# Patient Record
Sex: Female | Born: 1945
Health system: Southern US, Community
[De-identification: ages and names within clinical notes are randomized; demographics above are authoritative.]

## PROBLEM LIST (undated history)

## (undated) ENCOUNTER — Emergency Department (HOSPITAL_COMMUNITY): Payer: PRIVATE HEALTH INSURANCE

## (undated) DIAGNOSIS — Z8489 Family history of other specified conditions: Secondary | ICD-10-CM

## (undated) DIAGNOSIS — T7840XA Allergy, unspecified, initial encounter: Secondary | ICD-10-CM

## (undated) DIAGNOSIS — K219 Gastro-esophageal reflux disease without esophagitis: Secondary | ICD-10-CM

## (undated) DIAGNOSIS — D649 Anemia, unspecified: Secondary | ICD-10-CM

## (undated) DIAGNOSIS — M1990 Unspecified osteoarthritis, unspecified site: Secondary | ICD-10-CM

## (undated) DIAGNOSIS — I739 Peripheral vascular disease, unspecified: Secondary | ICD-10-CM

## (undated) DIAGNOSIS — J449 Chronic obstructive pulmonary disease, unspecified: Secondary | ICD-10-CM

## (undated) DIAGNOSIS — J189 Pneumonia, unspecified organism: Secondary | ICD-10-CM

## (undated) HISTORY — DX: Chronic obstructive pulmonary disease, unspecified: J44.9

## (undated) HISTORY — DX: Allergy, unspecified, initial encounter: T78.40XA

---

## 1998-07-06 HISTORY — PX: ABDOMINAL HYSTERECTOMY: SHX81

## 1999-02-24 ENCOUNTER — Inpatient Hospital Stay (HOSPITAL_COMMUNITY): Admission: RE | Admit: 1999-02-24 | Discharge: 1999-02-26 | Payer: Self-pay | Admitting: *Deleted

## 1999-02-24 ENCOUNTER — Encounter (INDEPENDENT_AMBULATORY_CARE_PROVIDER_SITE_OTHER): Payer: Self-pay

## 1999-02-24 ENCOUNTER — Encounter (INDEPENDENT_AMBULATORY_CARE_PROVIDER_SITE_OTHER): Payer: Self-pay | Admitting: Specialist

## 2000-12-28 ENCOUNTER — Ambulatory Visit (HOSPITAL_COMMUNITY): Admission: RE | Admit: 2000-12-28 | Discharge: 2000-12-28 | Payer: Self-pay | Admitting: Internal Medicine

## 2000-12-28 ENCOUNTER — Encounter: Payer: Self-pay | Admitting: Internal Medicine

## 2002-01-15 ENCOUNTER — Emergency Department (HOSPITAL_COMMUNITY): Admission: EM | Admit: 2002-01-15 | Discharge: 2002-01-15 | Payer: Self-pay | Admitting: Emergency Medicine

## 2002-03-14 ENCOUNTER — Encounter: Payer: Self-pay | Admitting: Orthopedic Surgery

## 2002-03-21 ENCOUNTER — Ambulatory Visit (HOSPITAL_COMMUNITY): Admission: RE | Admit: 2002-03-21 | Discharge: 2002-03-21 | Payer: Self-pay | Admitting: Internal Medicine

## 2002-03-21 ENCOUNTER — Encounter: Payer: Self-pay | Admitting: Internal Medicine

## 2002-07-18 ENCOUNTER — Emergency Department (HOSPITAL_COMMUNITY): Admission: EM | Admit: 2002-07-18 | Discharge: 2002-07-18 | Payer: Self-pay | Admitting: *Deleted

## 2002-07-18 ENCOUNTER — Encounter: Payer: Self-pay | Admitting: *Deleted

## 2003-12-09 ENCOUNTER — Emergency Department (HOSPITAL_COMMUNITY): Admission: EM | Admit: 2003-12-09 | Discharge: 2003-12-09 | Payer: Self-pay | Admitting: Emergency Medicine

## 2003-12-09 ENCOUNTER — Encounter: Payer: Self-pay | Admitting: Orthopedic Surgery

## 2006-12-13 ENCOUNTER — Emergency Department: Payer: Self-pay | Admitting: Emergency Medicine

## 2007-08-20 ENCOUNTER — Emergency Department (HOSPITAL_COMMUNITY): Admission: EM | Admit: 2007-08-20 | Discharge: 2007-08-20 | Payer: Self-pay | Admitting: Emergency Medicine

## 2009-10-16 ENCOUNTER — Emergency Department (HOSPITAL_COMMUNITY): Admission: EM | Admit: 2009-10-16 | Discharge: 2009-10-16 | Payer: Self-pay | Admitting: Emergency Medicine

## 2009-10-27 ENCOUNTER — Emergency Department (HOSPITAL_COMMUNITY): Admission: EM | Admit: 2009-10-27 | Discharge: 2009-10-27 | Payer: Self-pay | Admitting: Emergency Medicine

## 2009-10-27 ENCOUNTER — Encounter: Payer: Self-pay | Admitting: Orthopedic Surgery
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
## 2009-12-03 ENCOUNTER — Encounter: Payer: Self-pay | Admitting: Orthopedic Surgery

## 2009-12-04 ENCOUNTER — Ambulatory Visit: Payer: Self-pay | Admitting: Orthopedic Surgery

## 2009-12-04 DIAGNOSIS — S335XXA Sprain of ligaments of lumbar spine, initial encounter: Secondary | ICD-10-CM | POA: Insufficient documentation

## 2009-12-17 ENCOUNTER — Encounter: Payer: Self-pay | Admitting: Orthopedic Surgery

## 2009-12-17 ENCOUNTER — Encounter (HOSPITAL_COMMUNITY): Admission: RE | Admit: 2009-12-17 | Discharge: 2010-01-16 | Payer: Self-pay | Admitting: Orthopedic Surgery

## 2010-01-15 ENCOUNTER — Encounter: Payer: Self-pay | Admitting: Orthopedic Surgery

## 2010-01-17 ENCOUNTER — Encounter (HOSPITAL_COMMUNITY): Admission: RE | Admit: 2010-01-17 | Discharge: 2010-02-16 | Payer: Self-pay | Admitting: Orthopedic Surgery

## 2010-01-24 ENCOUNTER — Encounter: Payer: Self-pay | Admitting: Orthopedic Surgery

## 2010-02-24 ENCOUNTER — Ambulatory Visit: Payer: Self-pay | Admitting: Orthopedic Surgery

## 2010-04-07 ENCOUNTER — Ambulatory Visit: Payer: Self-pay | Admitting: Orthopedic Surgery

## 2010-04-07 DIAGNOSIS — M5126 Other intervertebral disc displacement, lumbar region: Secondary | ICD-10-CM | POA: Insufficient documentation

## 2010-04-21 ENCOUNTER — Telehealth (INDEPENDENT_AMBULATORY_CARE_PROVIDER_SITE_OTHER): Payer: Self-pay | Admitting: *Deleted

## 2010-04-28 ENCOUNTER — Encounter: Payer: Self-pay | Admitting: Orthopedic Surgery

## 2010-05-01 ENCOUNTER — Telehealth: Payer: Self-pay | Admitting: Orthopedic Surgery

## 2010-05-16 ENCOUNTER — Encounter (INDEPENDENT_AMBULATORY_CARE_PROVIDER_SITE_OTHER): Payer: Self-pay | Admitting: *Deleted

## 2010-05-19 ENCOUNTER — Encounter: Payer: Self-pay | Admitting: Orthopedic Surgery

## 2010-05-20 ENCOUNTER — Ambulatory Visit (HOSPITAL_COMMUNITY)
Admission: RE | Admit: 2010-05-20 | Discharge: 2010-05-20 | Payer: Self-pay | Source: Home / Self Care | Admitting: Orthopedic Surgery

## 2010-05-27 ENCOUNTER — Ambulatory Visit: Payer: Self-pay | Admitting: Orthopedic Surgery

## 2010-06-16 ENCOUNTER — Telehealth: Payer: Self-pay | Admitting: Orthopedic Surgery

## 2010-06-19 ENCOUNTER — Encounter (INDEPENDENT_AMBULATORY_CARE_PROVIDER_SITE_OTHER): Payer: Self-pay | Admitting: *Deleted

## 2010-06-21 ENCOUNTER — Emergency Department (HOSPITAL_COMMUNITY)
Admission: EM | Admit: 2010-06-21 | Discharge: 2010-06-21 | Payer: Self-pay | Source: Home / Self Care | Admitting: Emergency Medicine

## 2010-07-08 ENCOUNTER — Encounter
Admission: RE | Admit: 2010-07-08 | Discharge: 2010-07-08 | Payer: Self-pay | Source: Home / Self Care | Attending: Orthopedic Surgery | Admitting: Orthopedic Surgery

## 2010-07-22 ENCOUNTER — Encounter
Admission: RE | Admit: 2010-07-22 | Discharge: 2010-07-22 | Payer: Self-pay | Source: Home / Self Care | Attending: Orthopedic Surgery | Admitting: Orthopedic Surgery

## 2010-07-27 ENCOUNTER — Encounter: Payer: Self-pay | Admitting: Orthopedic Surgery

## 2010-08-05 NOTE — Miscellaneous (Signed)
Summary: PT clinical evaluation  PT clinical evaluation   Imported By: Jacklynn Ganong 12/30/2009 15:08:42  _____________________________________________________________________  External Attachment:    Type:   Image     Comment:   External Document
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
## 2010-08-05 NOTE — Miscellaneous (Signed)
Summary: MRI pre-auth info and MRI appt scheduled  Clinical Lists Changes  Contacted [employer]; verified eligibility (birthdate issue in their system resolved), verified per phone and per online system.   Re: pre-cert for MRI of Lspine, CPT [id number], dx 722.10, spoke w/Ms. Moss; received pre-auth/notification # [phone].   Scheduled MRI appointment at [facility], 05/20/10 at 2:00pm, registration 1:30pm.  Patient to follow up here for results.  Notified patient and have scheduled fol/up appt.

## 2010-08-05 NOTE — Miscellaneous (Signed)
Summary: PT discharge summary  PT discharge summary   Imported By: Jacklynn Ganong 01/30/2010 11:42:51  _____________________________________________________________________  External Attachment:    Type:   Image     Comment:   External Document

## 2010-08-05 NOTE — Letter (Signed)
Summary: Medical record request [facility]  Medical record request [facility]   Imported By: Cammie Sickle 05/05/2010 17:41:43  _____________________________________________________________________  External Attachment:    Type:   Image     Comment:   External Document

## 2010-08-05 NOTE — Miscellaneous (Signed)
Summary: PT Progress note  PT Progress note   Imported By: Jacklynn Ganong 01/21/2010 12:21:29  _____________________________________________________________________  External Attachment:    Type:   Image     Comment:   External Document

## 2010-08-05 NOTE — Progress Notes (Signed)
Summary: Patient has updated [employer] ins w/correct DOB  Phone Note Call from Patient   Caller: Patient Summary of Call: Patient called to relay that she has spoken w/[employer] member services and they are correcting her DOB to 2046/01/17. They had an incorrect date of birth previously.  She was told we should be able to verify her in system correctly and schedule her MRI. Initial call taken by: Jacklynn Ganong,  May 01, 2010 2:07 PM  Follow-up for Phone Call        Checked on status of birthdate corrections both online and via phone w/[employer] 10/28, 05/06/10 and 05/09/10. Follow-up by: Cammie Sickle,  May 13, 2010 3:45 PM
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
## 2010-08-05 NOTE — Letter (Signed)
Summary: History form  History form   Imported By: Jacklynn Ganong 12/12/2009 16:51:48  _____________________________________________________________________  External Attachment:    Type:   Image     Comment:   External Document
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
## 2010-08-05 NOTE — Miscellaneous (Signed)
Summary: Physical Therapy order  Physical Therapy order   Imported By: Cammie Sickle 12/11/2009 10:57:47  _____________________________________________________________________  External Attachment:    Type:   Image     Comment:   External Document

## 2010-08-05 NOTE — Letter (Signed)
Summary: MRI Authorization [facility]   MRI Authorization [employer]   Imported By: Cammie Sickle 05/20/2010 19:03:16  _____________________________________________________________________  External Attachment:    Type:   Image     Comment:   External Document

## 2010-08-05 NOTE — Letter (Signed)
Summary: temporary handicapped placard  temporary handicapped placard   Imported By: Cammie Sickle 12/23/2009 18:27:13  _____________________________________________________________________  External Attachment:    Type:   Image     Comment:   External Document

## 2010-08-07 NOTE — Miscellaneous (Signed)
Summary: faxed order for esi L5-S1 [facility]  Clinical Lists Changes

## 2010-08-07 NOTE — Progress Notes (Signed)
Summary: esi order  Phone Note Call from Patient   Caller: Patient Summary of Call: Patient called back regarding ESI injections as recommended. Would like to proceed even if before the holidays.  Initial call taken by: Cammie Sickle,  June 16, 2010 2:27 PM  Follow-up for Phone Call        which level  Follow-up by: Ether Griffins,  June 17, 2010 8:54 AM  Additional Follow-up for Phone Call Additional follow up Details #1::        L5-S1

## 2010-09-03 ENCOUNTER — Ambulatory Visit (INDEPENDENT_AMBULATORY_CARE_PROVIDER_SITE_OTHER): Payer: PRIVATE HEALTH INSURANCE | Admitting: Orthopedic Surgery

## 2010-09-03 ENCOUNTER — Encounter: Payer: Self-pay | Admitting: Orthopedic Surgery

## 2010-09-03 DIAGNOSIS — M5126 Other intervertebral disc displacement, lumbar region: Secondary | ICD-10-CM

## 2010-09-04 ENCOUNTER — Encounter: Payer: Self-pay | Admitting: Orthopedic Surgery

## 2010-09-11 NOTE — Assessment & Plan Note (Signed)
Summary: fol/up after 2nd esi/bcbs/bsf   Visit Type:  Follow-up Referring Provider:  ap er Primary Provider:  na  CC:  back pain.  History of Present Illness: I saw Laurie Wallace in the office today for a followup visit.  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  Mild bilateral foraminal narrowing at L5-S1 is noted. No overt nerve root compression.  Medications: Vicodin, Robaxin.  Today follow up after 2nd ESI.  Complaints: Patient states that the shots did not help, her back is still hurting.  I have exhausted nonoperative treatments for this patient with disc disease. She is not improving with epidurals. I offered spine consultation for her but she wanted to try the mediation. A little longer  Allergies: No Known Drug Allergies   Impression & Recommendations:  Problem # 1:  DEGENERATIVE DISC DISEASE, LUMBOSACRAL SPINE W/RADICULOPATHY (ICD-722.10) Assessment Unchanged  Orders: Est. Patient Level II (16109)  Patient Instructions: 1)  Try medication 3 more weeks  2)  if you are still having pain call the office for [place] to set up referral for spine specialist  Prescriptions: PREDNISONE (PAK) 10 MG TABS (PREDNISONE) as directed  #1 x 1   Entered and Authorized by:   Fuller Canada MD   Signed by:   Fuller Canada MD on 09/03/2010   Method used:   Print then Give to Patient   RxID:   [id number] VICODIN 5-500 MG TABS (HYDROCODONE-ACETAMINOPHEN) 1 by mouth q 4prn pain  #42 x 5   Entered and Authorized by:   Fuller Canada MD   Signed by:   Fuller Canada MD on 09/03/2010   Method used:   Print then Give to Patient   RxID:   [id number] ROBAXIN 500 MG TABS (METHOCARBAMOL) 1 by mouth q 6 as needed pain back  #28 x 5   Entered and Authorized by:   Fuller Canada  MD   Signed by:   Fuller Canada MD on 09/03/2010   Method used:   Print then Give to Patient   RxID:   [id number] ROBAXIN 500 MG TABS (METHOCARBAMOL) 1 by mouth q 6 as needed pain back  #28 x 5   Entered and Authorized by:   Fuller Canada MD   Signed by:   Fuller Canada MD on 09/03/2010   Method used:   Print then Give to Patient   RxID:   [id number] VICODIN 5-500 MG TABS (HYDROCODONE-ACETAMINOPHEN) 1 by mouth q 4prn pain  #42 x 5   Entered and Authorized by:   Fuller Canada MD   Signed by:   Fuller Canada MD on 09/03/2010   Method used:   Print then Give to Patient   RxID:   [id number] PREDNISONE (PAK) 10 MG TABS (PREDNISONE) as directed  #1 x 1   Entered and Authorized by:   Fuller Canada MD   Signed by:   Fuller Canada MD on 09/03/2010   Method used:   Print then Give to Patient   RxID:   [id number]    Orders Added: 1)  Est. Patient Level II [55732]

## 2010-09-25 ENCOUNTER — Telehealth: Payer: Self-pay | Admitting: Orthopedic Surgery

## 2010-09-25 ENCOUNTER — Telehealth: Payer: Self-pay | Admitting: *Deleted

## 2010-09-25 DIAGNOSIS — M549 Dorsalgia, unspecified: Secondary | ICD-10-CM

## 2010-09-25 NOTE — Telephone Encounter (Signed)
Forwarding to [employer]

## 2010-09-25 NOTE — Telephone Encounter (Signed)
I made referral for [facility]

## 2010-09-25 NOTE — Telephone Encounter (Signed)
Yes ok to refer.  

## 2010-09-25 NOTE — Telephone Encounter (Signed)
I made referral to [facility]

## 2010-09-25 NOTE — Telephone Encounter (Signed)
Laurie Wallace called to let you know she is ready to be referred to the spine specialist you mentioned at last appointment.  Can we refer or does she need an appointment here first.  Please advise

## 2010-10-14 ENCOUNTER — Telehealth: Payer: Self-pay | Admitting: Orthopedic Surgery

## 2011-01-10 ENCOUNTER — Ambulatory Visit (HOSPITAL_COMMUNITY)
Admission: RE | Admit: 2011-01-10 | Discharge: 2011-01-10 | Disposition: A | Discharge status: Home / Self Care | Payer: BC Managed Care – PPO | Source: Ambulatory Visit | Attending: Family Medicine | Admitting: Family Medicine

## 2011-01-10 ENCOUNTER — Other Ambulatory Visit: Payer: Self-pay | Admitting: Family Medicine

## 2011-01-10 DIAGNOSIS — R059 Cough, unspecified: Secondary | ICD-10-CM | POA: Insufficient documentation

## 2011-01-10 DIAGNOSIS — R05 Cough: Secondary | ICD-10-CM

## 2011-01-10 IMAGING — CR DG CHEST 2V
2 series · 2 of 2 positions shown · non-contrast
Comparison: None.

CLINICAL DATA: Cough.

CHEST - 2 VIEW

[view not recorded (1 of 2)]
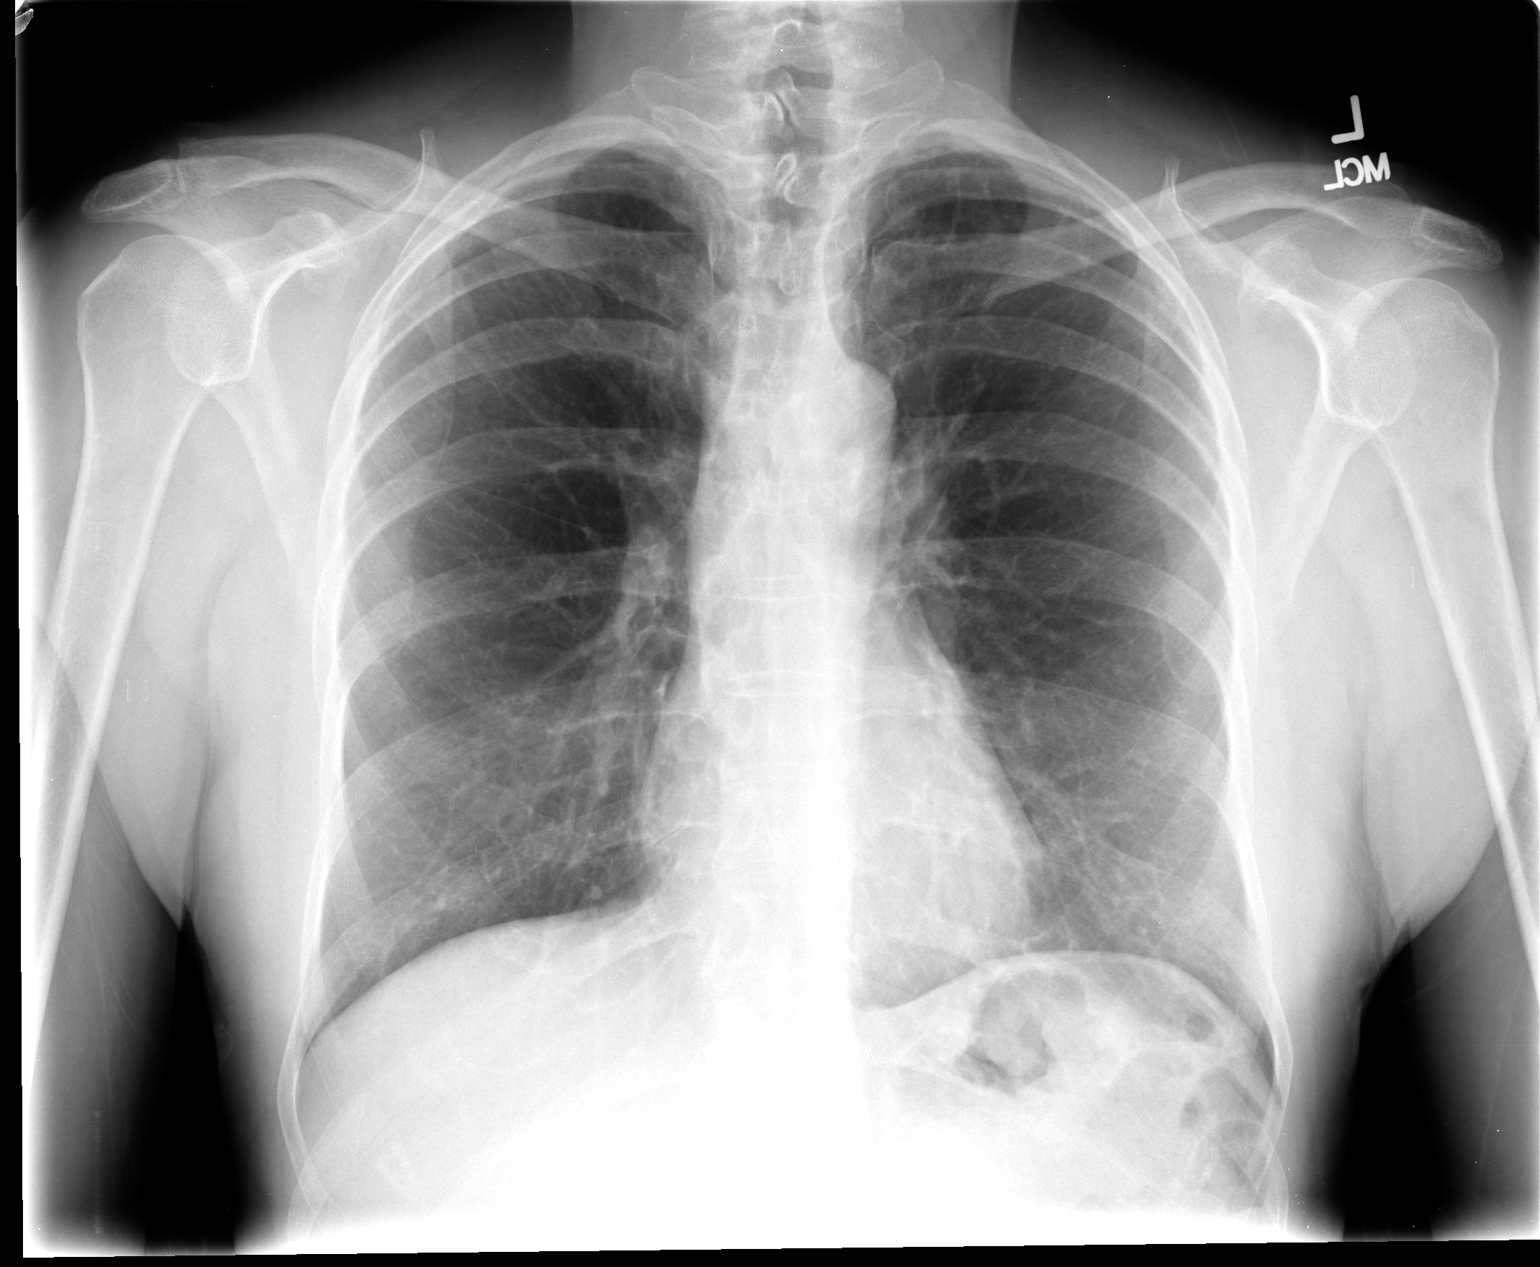

[view not recorded (2 of 2)]
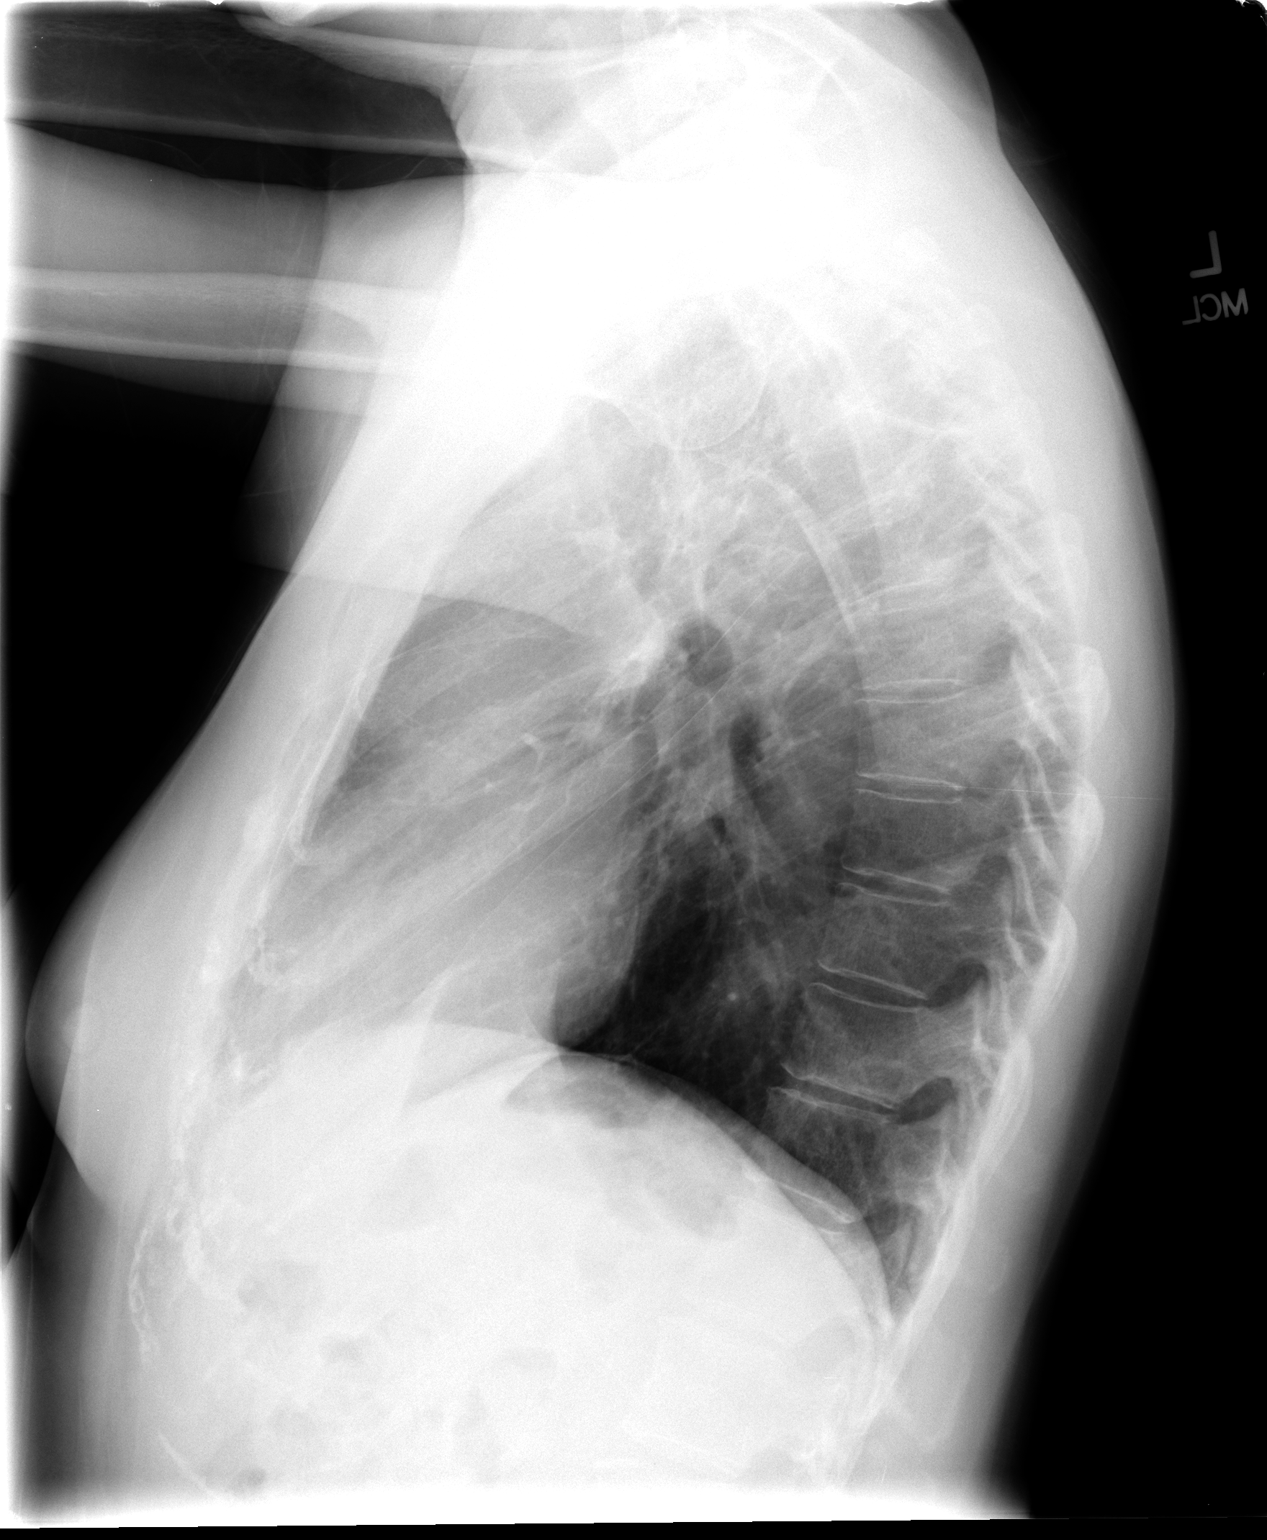

[2 of 2 positions shown; findings below may reference images not displayed]

FINDINGS: Lungs clear.  Heart size normal.  No pneumothorax or
effusion.
IMPRESSION: No acute disease.

## 2011-01-21 ENCOUNTER — Telehealth: Payer: Self-pay | Admitting: Radiology

## 2011-01-21 NOTE — Telephone Encounter (Signed)
error 

## 2011-01-30 ENCOUNTER — Emergency Department (HOSPITAL_COMMUNITY)
Admission: EM | Admit: 2011-01-30 | Discharge: 2011-01-30 | Disposition: A | Discharge status: Home / Self Care | Payer: BC Managed Care – PPO | Attending: Emergency Medicine | Admitting: Emergency Medicine

## 2011-01-30 ENCOUNTER — Encounter (HOSPITAL_COMMUNITY): Payer: Self-pay | Admitting: *Deleted

## 2011-01-30 DIAGNOSIS — T63441A Toxic effect of venom of bees, accidental (unintentional), initial encounter: Secondary | ICD-10-CM

## 2011-01-30 DIAGNOSIS — R229 Localized swelling, mass and lump, unspecified: Secondary | ICD-10-CM | POA: Insufficient documentation

## 2011-01-30 DIAGNOSIS — T63461A Toxic effect of venom of wasps, accidental (unintentional), initial encounter: Secondary | ICD-10-CM | POA: Insufficient documentation

## 2011-01-30 DIAGNOSIS — R52 Pain, unspecified: Secondary | ICD-10-CM | POA: Insufficient documentation

## 2011-01-30 DIAGNOSIS — T6391XA Toxic effect of contact with unspecified venomous animal, accidental (unintentional), initial encounter: Secondary | ICD-10-CM | POA: Insufficient documentation

## 2011-01-30 MED ORDER — IBUPROFEN 800 MG PO TABS
800.0000 mg | ORAL_TABLET | Freq: Once | ORAL | Status: AC
  Administered 2011-01-30: 800 mg via ORAL
  Filled 2011-01-30: qty 1

## 2011-01-30 MED ORDER — IBUPROFEN 800 MG PO TABS: 800.0000 mg | ORAL_TABLET | Freq: Three times a day (TID) | ORAL | Status: AC

## 2011-01-30 MED ORDER — DIPHENHYDRAMINE HCL 25 MG PO CAPS
25.0000 mg | ORAL_CAPSULE | Freq: Once | ORAL | Status: AC
  Administered 2011-01-30: 25 mg via ORAL
  Filled 2011-01-30: qty 1

## 2011-01-30 MED ORDER — HYDROCODONE-ACETAMINOPHEN 5-500 MG PO TABS: 1.0000 | ORAL_TABLET | ORAL | Status: AC | PRN

## 2011-01-30 MED ORDER — HYDROCODONE-ACETAMINOPHEN 5-325 MG PO TABS
1.0000 | ORAL_TABLET | Freq: Once | ORAL | Status: AC
  Administered 2011-01-30: 1 via ORAL
  Filled 2011-01-30: qty 1

## 2011-01-30 NOTE — ED Notes (Signed)
Pt states she was mowing the yard and ran over a bee nest; pt states she was stung multiple times; pt has swelling to left eye and bee stings to bilateral arms and bilateral legs
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
## 2011-01-30 NOTE — ED Provider Notes (Signed)
History     Chief Complaint  Patient presents with  . Insect Bite   HPI Comments: Patient mowing the yard and mowed over a hornet's nest. Neldon Labella multiple times to face, neck, arms, legs. Now with swelling and pain to the sites.Denies shortness of breath, chest pain, nausea, vomiting, fever, chills.  Patient is a 65 y.o. female presenting with extremity pain. The history is provided by the patient.  Extremity Pain This is a new problem. The current episode started 6 to 12 hours ago. The problem occurs constantly. The problem has not changed since onset.The symptoms are aggravated by nothing. The symptoms are relieved by nothing. She has tried a cold compress for the symptoms. The treatment provided no relief.    History reviewed. No pertinent past medical history.  Past Surgical History  Procedure Date  . Abdominal hysterectomy     History reviewed. No pertinent family history.  History  Substance Use Topics  . Smoking status: Never Smoker   . Smokeless tobacco: Not on file  . Alcohol Use: No    OB History    Grav Para Term Preterm Abortions TAB SAB Ect Mult Living                  Review of Systems  All other systems reviewed and are negative.    Physical Exam  BP 137/77  Pulse 83  Temp(Src) 98.3 F (36.8 C) (Oral)  Resp 20  Ht 5\' 6"  (1.676 m)  Wt 120 lb (54.432 kg)  BMI 19.37 kg/m2  SpO2 100%  Physical Exam  Nursing note and vitals reviewed. Constitutional: She is oriented to person, place, and time. She appears well-developed and well-nourished.  HENT:  Head: Normocephalic.  Right Ear: External ear normal.  Left Ear: External ear normal.       Left eyelid swollen. Left cheek bone with evidence of insect bite.  Eyes: EOM are normal. Pupils are equal, round, and reactive to light.  Neck: Normal range of motion. Neck supple.  Cardiovascular: Normal rate.   Pulmonary/Chest: Effort normal and breath sounds normal.  Abdominal: Soft.  Neurological: She is  alert and oriented to person, place, and time.  Skin:       Multiple areas to arms legs and back with raised erythematous areas where patient was stung    ED Course  Procedures  MDM      Nicoletta Dress. Colon Branch, MD 01/30/11 2304

## 2011-02-12 ENCOUNTER — Telehealth: Payer: Self-pay | Admitting: Orthopedic Surgery

## 2011-02-12 NOTE — Telephone Encounter (Signed)
Patient called to relay that her back is still hurting.  She requests appointment here.  Last office note of 09/03/10 indicates:   Patient Instructions: 1)  Try medication 3 more weeks  2)  if you are still having pain call the office for [place] to set up referral for spine specialist   Refer at this time?   Patient ph # [phone] or [phone]

## 2011-02-13 NOTE — Telephone Encounter (Signed)
Patient Instructions:  1) Try medication 3 more weeks  2) if you are still having pain call the office for [place] to set up referral for spine specialist

## 2011-02-13 NOTE — Telephone Encounter (Signed)
Called back to patient to relay Dr. Mort Sawyers response. Left voice mail message for patient to return call.

## 2011-02-18 NOTE — Telephone Encounter (Signed)
02/18/11 called back to patient. Voice msg's were full on main ph#.  Left msg on work ph#.

## 2013-01-08 ENCOUNTER — Emergency Department (HOSPITAL_COMMUNITY)
Admission: EM | Admit: 2013-01-08 | Discharge: 2013-01-08 | Disposition: A | Discharge status: Home / Self Care | Payer: BC Managed Care – PPO | Attending: Emergency Medicine | Admitting: Emergency Medicine

## 2013-01-08 ENCOUNTER — Encounter (HOSPITAL_COMMUNITY): Payer: Self-pay | Admitting: Emergency Medicine

## 2013-01-08 ENCOUNTER — Emergency Department (HOSPITAL_COMMUNITY): Payer: BC Managed Care – PPO

## 2013-01-08 ENCOUNTER — Other Ambulatory Visit: Payer: Self-pay

## 2013-01-08 DIAGNOSIS — M545 Low back pain, unspecified: Secondary | ICD-10-CM

## 2013-01-08 DIAGNOSIS — M129 Arthropathy, unspecified: Secondary | ICD-10-CM | POA: Insufficient documentation

## 2013-01-08 DIAGNOSIS — IMO0002 Reserved for concepts with insufficient information to code with codable children: Secondary | ICD-10-CM

## 2013-01-08 DIAGNOSIS — Z79899 Other long term (current) drug therapy: Secondary | ICD-10-CM | POA: Insufficient documentation

## 2013-01-08 DIAGNOSIS — M542 Cervicalgia: Secondary | ICD-10-CM | POA: Insufficient documentation

## 2013-01-08 HISTORY — DX: Unspecified osteoarthritis, unspecified site: M19.90

## 2013-01-08 LAB — BASIC METABOLIC PANEL
BUN: 10 mg/dL (ref 6–23)
Calcium: 9.3 mg/dL (ref 8.4–10.5)
Creatinine, Ser: 0.73 mg/dL (ref 0.50–1.10)
GFR calc Af Amer: >90 mL/min (ref >90)
Potassium: 3.7 mEq/L (ref 3.5–5.1)

## 2013-01-08 MED ORDER — METHOCARBAMOL 500 MG PO TABS: 1000.0000 mg | ORAL_TABLET | Freq: Four times a day (QID) | ORAL | Status: AC

## 2013-01-08 MED ORDER — NAPROXEN 375 MG PO TABS: 375.0000 mg | ORAL_TABLET | Freq: Two times a day (BID) | ORAL | Status: DC

## 2013-01-08 MED ORDER — HYDROCODONE-ACETAMINOPHEN 5-325 MG PO TABS: 1.0000 | ORAL_TABLET | ORAL | Status: DC | PRN

## 2013-01-08 NOTE — ED Notes (Signed)
Pt c/o Left lateral/anterior neck pain that radiates into Left shoulder and arm. Pain to lower back. And bilateral leg cramping with left worse. Sx's x 3 weeks. Alert/oriented/ambulates well. Denies chest pressure/tightness/pain. Denies taking any meds at home to help with pain.

## 2013-01-08 NOTE — ED Provider Notes (Signed)
Medical screening examination/treatment/procedure(s) were conducted as a shared visit with non-physician practitioner(s) and myself.  I personally evaluated the patient during the encounter.   No acute abnormalities. X-ray show no fracture  Donnetta Hutching, MD 01/08/13 [phone]

## 2013-01-08 NOTE — ED Provider Notes (Signed)
History    CSN: [id number] Arrival date & time 01/08/13  0802  First MD Initiated Contact with Patient 01/08/13 0815     Chief Complaint  Patient presents with  . Leg Pain  . Back Pain  . Shoulder Pain   (Consider location/radiation/quality/duration/timing/severity/associated sxs/prior Treatment) HPI Comments: Laurie Wallace is a 67 y.o. Female presenting with a 3 week history of worsening pain in her lower back.  She has a history of degenerative disk disease, originally diagnosed by Dr. Romeo Apple over 2 years ago.  She was given steroid injections which did not relieve her symptoms.  She was referred to a neurosurgeon in [place],  But has opted to not have surgery until she has to.  She describes bilateral lower back pain which is worsened with movement,  And particularly worse when first moving after sitting or lying for more than a few minutes, better after she has been up awhile. She has also been having intermittent muscle spasms in her lower legs for the past several weeks.  She also describes left anterior neck pain which is intermittent,  Worse with movement and palpation and radiates into her left shoulder.  She denies chest pain, sob,  Cough,  Denies peripheral edema,  Fevers, chills,  Weakness or numbness in her upper or lower extremities.  She does not have urinary or bowel incontinence or retention.  She was prescribed hydrocodone by her pcp Dr. Dan Humphreys at the [facility] in [place] last year,  But was afraid to take for her current pain since the medicine was old.  She has taken no other medicines and has found no other alleviators for her pain other than rest.     The history is provided by the patient.   Past Medical History  Diagnosis Date  . Arthritis    Past Surgical History  Procedure Laterality Date  . Abdominal hysterectomy     History reviewed. No pertinent family history. History  Substance Use Topics  . Smoking status: Never Smoker   .  Smokeless tobacco: Not on file  . Alcohol Use: No   OB History   Grav Para Term Preterm Abortions TAB SAB Ect Mult Living                 Review of Systems  Constitutional: Negative for fever.  HENT: Positive for neck pain. Negative for neck stiffness.   Respiratory: Negative for shortness of breath.   Cardiovascular: Negative for chest pain and leg swelling.  Gastrointestinal: Negative for abdominal pain, constipation and abdominal distention.  Genitourinary: Negative for dysuria, urgency, frequency, flank pain and difficulty urinating.  Musculoskeletal: Positive for back pain. Negative for joint swelling and gait problem.  Skin: Negative for rash.  Neurological: Negative for weakness and numbness.    Allergies  Review of patient's allergies indicates no known allergies.  Home Medications   Current Outpatient Rx  Name  Route  Sig  Dispense  Refill  . fluticasone (FLONASE) 50 MCG/ACT nasal spray   Nasal   Place 2 sprays into the nose daily.           Marland Kitchen HYDROcodone-acetaminophen (NORCO/VICODIN) 5-325 MG per tablet   Oral   Take 1 tablet by mouth every 4 (four) hours as needed for pain.   15 tablet   0   . HYDROcodone-acetaminophen (VICODIN) 5-500 MG per tablet   Oral   Take 1 tablet by mouth every 4 (four) hours as needed.           Marland Kitchen  methocarbamol (ROBAXIN) 500 MG tablet   Oral   Take 500 mg by mouth every 6 (six) hours as needed.           . methocarbamol (ROBAXIN) 500 MG tablet   Oral   Take 2 tablets (1,000 mg total) by mouth 4 (four) times daily.   40 tablet   0   . naproxen (NAPROSYN) 375 MG tablet   Oral   Take 1 tablet (375 mg total) by mouth 2 (two) times daily.   20 tablet   0   . predniSONE (DELTASONE) 10 MG tablet pack   Oral   Take 10 mg by mouth daily. As directed          . Pseudoephedrine-Acetaminophen (EQ SINUS MAXIMUM STRENGTH PO)   Oral   Take 1 tablet by mouth daily as needed. For sinus congestion           BP 102/55   Pulse 72  Temp(Src) 97.6 F (36.4 C) (Oral)  Resp 16  SpO2 98% Physical Exam  Nursing note and vitals reviewed. Constitutional: She appears well-developed and well-nourished.  HENT:  Head: Normocephalic.  Eyes: Conjunctivae are normal.  Neck: Trachea normal and normal range of motion. Neck supple. Muscular tenderness present. No spinous process tenderness present. Carotid bruit is not present.  ttp along left scm.  Cardiovascular: Normal rate and intact distal pulses.   Pedal pulses normal.  Pulmonary/Chest: Effort normal.  Abdominal: Soft. Bowel sounds are normal. She exhibits no distension and no mass.  Musculoskeletal: Normal range of motion. She exhibits no edema.       Lumbar back: She exhibits tenderness. She exhibits no swelling, no edema and no spasm.  Neurological: She is alert. She has normal strength. She displays no atrophy and no tremor. No sensory deficit. Gait normal.  Reflex Scores:      Patellar reflexes are 2+ on the right side and 2+ on the left side.      Achilles reflexes are 2+ on the right side and 2+ on the left side. No strength deficit noted in hip and knee flexor and extensor muscle groups.  Ankle flexion and extension intact.  Skin: Skin is warm and dry.  Psychiatric: She has a normal mood and affect.    ED Course  Procedures (including critical care time)      Date: 01/08/2013  Rate: 74  Rhythm: normal sinus rhythm  QRS Axis: normal  Intervals: normal  ST/T Wave abnormalities: normal  Conduction Disutrbances:none  Narrative Interpretation:   Old EKG Reviewed: unchanged   Labs Reviewed  BASIC METABOLIC PANEL - Abnormal; Notable for the following:    GFR calc non Af Amer 87 (*)    All other components within normal limits  TROPONIN I   Dg Cervical Spine Complete  01/08/2013   *RADIOLOGY REPORT*  Clinical Data: Back, shoulder and leg pain  CERVICAL SPINE - COMPLETE 4+ VIEW  Comparison: Concurrently obtained radiographs of the lumbosacral  spine  Findings: No acute fracture, malalignment or prevertebral soft tissue swelling.  Focal narrowing of the right neural foramen at C3- C4 primarily secondary to uncovertebral joint hypertrophy. Incomplete fusion of the posterior elements of T1 and T2.  The visualized lung apices are within normal limits.  Mild foraminal narrowing on the left at C4-C5.  IMPRESSION:  1.  No acute fracture or malalignment. 2.  Moderate right foraminal narrowing at C3-C4 secondary to uncovertebral joint hypertrophy and mild foraminal narrowing on the left at C4-C5 secondary to  facet arthropathy and uncovertebral joint hypertrophy. 3.  Incomplete fusion of the posterior elements of T1 and T2 noted incidentally.   Original Report Authenticated By: Malachy Moan, M.D.   Dg Lumbar Spine Complete  01/08/2013   *RADIOLOGY REPORT*  Clinical Data: Lower back pain  LUMBAR SPINE - COMPLETE 4+ VIEW  Comparison: Concurrently obtained radiographs of the cervical spine; Prior lumbar spine radiographs 10/27/2009; prior lumbar spine MRI 05/20/2010  Findings: AP, lateral and bilateral oblique radiographs of the lumbosacral spine demonstrate no acute fracture or malalignment. Relatively focal degenerative disc disease at L5-S1 without significant interval progression.  Minimal L5-S1 facet arthropathy. Vertebral body heights are maintained.  Unremarkable visualized bowel gas pattern.  IMPRESSION:  1.  No acute fracture or malalignment. 2.  L5-S1 degenerative disc disease without significant interval progression.   Original Report Authenticated By: Malachy Moan, M.D.   1. Low back pain   2. Degenerative disk disease   3. Cervical pain     MDM  Patients labs and/or radiological studies were viewed and considered during the medical decision making and disposition process. Pt's exam is consistent with musculoskeletal source of sx. Pain is reproducible.  No neuro deficit on exam or by history to suggest emergent or surgical presentation.   Also discussed worsened sx that should prompt immediate re-evaluation including distal weakness, bowel/bladder retention/incontinence. Prescribed robaxin, hydrocodone, naproxen.  Planned f/u with pcp if not improved over the next week.    Burgess Amor, PA-C 01/08/13 [phone]

## 2013-07-06 HISTORY — PX: TOE SURGERY: SHX1073

## 2014-04-24 ENCOUNTER — Ambulatory Visit: Payer: Self-pay

## 2014-08-29 ENCOUNTER — Ambulatory Visit: Payer: Medicare Other

## 2014-09-04 ENCOUNTER — Ambulatory Visit (INDEPENDENT_AMBULATORY_CARE_PROVIDER_SITE_OTHER): Payer: Medicare Other

## 2014-09-04 ENCOUNTER — Ambulatory Visit (INDEPENDENT_AMBULATORY_CARE_PROVIDER_SITE_OTHER): Payer: BLUE CROSS/BLUE SHIELD | Admitting: Podiatry

## 2014-09-04 ENCOUNTER — Encounter: Payer: Self-pay | Admitting: Podiatry

## 2014-09-04 VITALS — BP 126/63 | HR 80 | Resp 16 | Ht 66.0 in | Wt 130.0 lb

## 2014-09-04 DIAGNOSIS — M2010 Hallux valgus (acquired), unspecified foot: Secondary | ICD-10-CM

## 2014-09-04 NOTE — Progress Notes (Signed)
   Subjective:    Patient ID: Laurie Wallace, female    DOB: 1945/09/15, 69 y.o.   MRN: [id number]  HPI Comments: 69 year old female presents the office they with complaints of a right foot bunion and a painful callus on the bottom of her foot under the big toe joint. She states that she has been having pain to her right foot over the last year has been progressive. She previously seen another physician who diagnosed her with a bunion. She states that she has tried conservative treatment including shoe gear modifications and padding without any resolution of symptoms. She states that her foot is painful particularly with pressure in certain shoes. She periodically gets redness overlying her bunion due to irritation. At this time she is requesting surgical intervention to help decrease her pain and deformity.  No other complaints at this time.     Review of Systems  All other systems reviewed and are negative.      Objective:   Physical Exam  Constitutional: She is oriented to person, place, and time. She appears well-developed.  Musculoskeletal:  Bilateral HAV deformity is present with lateral deviation of the hallux and a prominent medial aspect of the 1st metatarsal head. There is no pain or crepitation with 1st MTPJ ROM. There is no significant hypermobility bilaterally. There is tenderness to palpation over the medial aspect of the 1st metatarsal head and along the medial sesamoid plantarally.   Neurological: She is alert and oriented to person, place, and time.  Protective sensation intact with SWMF, vibratory sensation intact, Achilles tendon reflex intact.   Skin:  Hyperkerotic lesion along the plantar medial sesamoid on the right foot. There is the start of a lesion on the left foot in the same area, however not as significant. No other open lesions or pre-ulcerative lesions are identified.   Nursing note and vitals reviewed.         Assessment & Plan:  69 year old female  with symptomatic right HAV deformity and prominent medial sesamoid.  -Treatment options both conservative and surgical were discussed the patient including alternatives, risks, complications. -At this time the patient is requesting surgical intervention. I discussed with her correction of her bunion deformity (Austin/possible Akin), as well as excision of the medial sesamoid due to pain. I discussed our incision placement as well as the postoperative course. I discussed with her risks of surgery which include, but not limited to, infection, bleeding, pain, swelling, need for further surgery, delayed or nonhealing, painful or ugly scar, numbness or sensation changes, over/under correction, reoccurrence, transfer lesions, loss of foot/toe, DVT/PE, hardware failure. Patient understands the risks and would like to proceed with surgery. The surgical consent was reviewed with the patient not indicated for sinus. No promises or guarantees were given the fact, the procedure and all questions were answered to the best of my ability. She denies any history of sickle cell/sickle cell trait or any other chondral indications for tourniquet use. -Surgery will perform of the [facility] specialty surgical center. -She will follow-up in the office after surgery or sooner if any problems are to arise. I encouraged her to call the office with any questions, concerns, change in symptoms.

## 2014-09-04 NOTE — Patient Instructions (Signed)
Pre-Operative Instructions  Congratulations, you have decided to take an important step to improving your quality of life.  You can be assured that the doctors of [facility] will be with you every step of the way.  1. Plan to be at the surgery center/hospital at least 1 (one) hour prior to your scheduled time unless otherwise directed by the surgical center/hospital staff.  You must have a responsible adult accompany you, remain during the surgery and drive you home.  Make sure you have directions to the surgical center/hospital and know how to get there on time. 2. For hospital based surgery you will need to obtain a history and physical form from your family physician within 1 month prior to the date of surgery- we will give you a form for you primary physician.  3. We make every effort to accommodate the date you request for surgery.  There are however, times where surgery dates or times have to be moved.  We will contact you as soon as possible if a change in schedule is required.   4. No Aspirin/Ibuprofen for one week before surgery.  If you are on aspirin, any non-steroidal anti-inflammatory medications (Mobic, Aleve, Ibuprofen) you should stop taking it 7 days prior to your surgery.  You make take Tylenol  For pain prior to surgery.  5. Medications- If you are taking daily heart and blood pressure medications, seizure, reflux, allergy, asthma, anxiety, pain or diabetes medications, make sure the surgery center/hospital is aware before the day of surgery so they may notify you which medications to take or avoid the day of surgery. 6. No food or drink after midnight the night before surgery unless directed otherwise by surgical center/hospital staff. 7. No alcoholic beverages 24 hours prior to surgery.  No smoking 24 hours prior to or 24 hours after surgery. 8. Wear loose pants or shorts- loose enough to fit over bandages, boots, and casts. 9. No slip on shoes, sneakers are best. 10. Bring  your boot with you to the surgery center/hospital.  Also bring crutches or a walker if your physician has prescribed it for you.  If you do not have this equipment, it will be provided for you after surgery. 11. If you have not been contracted by the surgery center/hospital by the day before your surgery, call to confirm the date and time of your surgery. 12. Leave-time from work may vary depending on the type of surgery you have.  Appropriate arrangements should be made prior to surgery with your employer. 13. Prescriptions will be provided immediately following surgery by your doctor.  Have these filled as soon as possible after surgery and take the medication as directed. 14. Remove nail polish on the operative foot. 15. Wash the night before surgery.  The night before surgery wash the foot and leg well with the antibacterial soap provided and water paying special attention to beneath the toenails and in between the toes.  Rinse thoroughly with water and dry well with a towel.  Perform this wash unless told not to do so by your physician.  Enclosed: 1 Ice pack (please put in freezer the night before surgery)   1 Hibiclens skin cleaner   Pre-op Instructions  If you have any questions regarding the instructions, do not hesitate to call our office.  [place]: [address] [place], [place] [address] [phone]  [place]: [address] [place], [place] [address] [phone]  [place]: [address]  [place], [place] [address] [phone]  Dr. Richard   Tuchman DPM, Dr. Norman Regal DPM Dr. Richard Sikora DPM, Dr. M. Todd Hyatt DPM, Dr. Kathryn Egerton DPM, Dr. Ewing Fandino DPM 

## 2014-09-20 ENCOUNTER — Other Ambulatory Visit: Payer: Self-pay | Admitting: Podiatry

## 2014-09-20 DIAGNOSIS — Z01818 Encounter for other preprocedural examination: Secondary | ICD-10-CM

## 2014-09-20 NOTE — Progress Notes (Unsigned)
Per dr Jacqualyn Posey wanted pre op blood work , left lorretta a message to give [place] a call back

## 2014-09-28 ENCOUNTER — Other Ambulatory Visit: Payer: Self-pay | Admitting: Podiatry

## 2014-09-28 LAB — CBC WITH DIFFERENTIAL/PLATELET
BASOS ABS: 0 10*3/uL (ref 0.0–0.2)
Basos: 0 %
EOS ABS: 0.3 10*3/uL (ref 0.0–0.4)
Eos: 4 %
HCT: 39.5 % (ref 34.0–46.6)
Hemoglobin: 13.4 g/dL (ref 11.1–15.9)
IMMATURE GRANS (ABS): 0 10*3/uL (ref 0.0–0.1)
IMMATURE GRANULOCYTES: 0 %
Lymphocytes Absolute: 2.5 10*3/uL (ref 0.7–3.1)
Lymphs: 34 %
MCH: 30.4 pg (ref 26.6–33.0)
MCHC: 33.9 g/dL (ref 31.5–35.7)
MCV: 90 fL (ref 79–97)
MONOS ABS: 0.4 10*3/uL (ref 0.1–0.9)
Monocytes: 6 %
Neutrophils Absolute: 4.2 10*3/uL (ref 1.4–7.0)
Neutrophils Relative %: 56 %
PLATELETS: 313 10*3/uL (ref 150–379)
RBC: 4.41 x10E6/uL (ref 3.77–5.28)
RDW: 14 % (ref 12.3–15.4)
WBC: 7.4 10*3/uL (ref 3.4–10.8)

## 2014-09-29 LAB — BMP8+EGFR
BUN/Creatinine Ratio: 18 (ref 11–26)
BUN: 14 mg/dL (ref 8–27)
CALCIUM: 9 mg/dL (ref 8.7–10.3)
CO2: 24 mmol/L (ref 18–29)
Chloride: 104 mmol/L (ref 97–108)
Creatinine, Ser: 0.8 mg/dL (ref 0.57–1.00)
GFR calc Af Amer: 88 mL/min/{1.73_m2} (ref >59)
GFR calc non Af Amer: 76 mL/min/{1.73_m2} (ref >59)
Glucose: 101 mg/dL — ABNORMAL HIGH (ref 65–99)
POTASSIUM: 4.3 mmol/L (ref 3.5–5.2)
SODIUM: 142 mmol/L (ref 134–144)

## 2014-10-01 ENCOUNTER — Encounter: Payer: Self-pay | Admitting: Podiatry

## 2014-10-01 DIAGNOSIS — M84879 Other disorders of continuity of bone, unspecified ankle and foot: Secondary | ICD-10-CM | POA: Diagnosis not present

## 2014-10-01 DIAGNOSIS — M2011 Hallux valgus (acquired), right foot: Secondary | ICD-10-CM | POA: Diagnosis not present

## 2014-10-01 NOTE — Progress Notes (Signed)
DOS 10/01/2014 Right foot surgical correction of bunion deformity (Austin/Aiken Bunionectomy) and excision of medial sesamoid.

## 2014-10-03 ENCOUNTER — Telehealth: Payer: Self-pay | Admitting: *Deleted

## 2014-10-03 NOTE — Telephone Encounter (Signed)
Courtesy call:  I spoke with pt, she states she is doing great, and is resting and keeping her surgical boot on at all times.  I reminded pt of her 10/09/2014 appt at 915am, and to call with questions or concerns.

## 2014-10-09 ENCOUNTER — Encounter: Payer: Self-pay | Admitting: Podiatry

## 2014-10-09 ENCOUNTER — Encounter: Payer: Medicare Other | Admitting: Podiatry

## 2014-10-09 ENCOUNTER — Ambulatory Visit (INDEPENDENT_AMBULATORY_CARE_PROVIDER_SITE_OTHER): Payer: Medicare Other | Admitting: Podiatry

## 2014-10-09 ENCOUNTER — Ambulatory Visit (INDEPENDENT_AMBULATORY_CARE_PROVIDER_SITE_OTHER): Payer: Medicare Other

## 2014-10-09 VITALS — BP 139/74 | HR 80 | Resp 16

## 2014-10-09 DIAGNOSIS — M2011 Hallux valgus (acquired), right foot: Secondary | ICD-10-CM

## 2014-10-09 DIAGNOSIS — Z9889 Other specified postprocedural states: Secondary | ICD-10-CM

## 2014-10-09 NOTE — Progress Notes (Signed)
Patient ID: Laurie Wallace, female   DOB: 09-Feb-1946, 69 y.o.   MRN: [id number]  Subjective: 69 year old female returns the office today one-week status post right Austin bunionectomy, medial sesamoid excision. She states that she is doing well and she has not required any pain medication. She has continued wearing the cam walker at all times. She denies any systemic complaints as fevers, chills, nausea, vomiting. Denies any calf pain, chest pain, shortness of breath. She has not been taking antibiotic and she has not required the Phenergan. No other complaints at this time.  Objective: AAO 3, NAD, presents wearing cam walker. DP/PT pulses palpable, CRT less than 3 seconds Protective sensation intact with Simms Weinstein monofilament Dressings are clean, dry, intact. Incisional the dorsal medial aspect of the right foot as well coapted without any evidence of dehiscence. There is no surrounding erythema, ascending cellulitis, drainage, malodor, or any other clinical signs of infection. There is no specific tenderness along the surgical site there is no pain with first MTPJ range of motion. There is mild edema along the surgical site. No areas of tenderness to bilateral lower extremities. No open lesions or pre-ulcer lesions identified. No pain with calf compression, swelling, warmth, erythema.  Assessment: 69 year old female 1 week status post right Austin bunionectomy, medial sesamoid excision  Plan: -X-rays were obtained and reviewed with the patient. -Treatment options were discussed we alternatives, risks, complications. -Antibiotic when it was placed over the incision followed by dry sterile dressing. Recommended to keep his dressing clean, dry, intact. -Continue Cam Walker. -Ice and elevation -Pain meds as needed -Follow-up in one week for likely suture removal or sooner if any problems are to arise. In the meantime encouraged to call the office with any questions, concerns, change  in symptoms.

## 2014-10-16 ENCOUNTER — Ambulatory Visit (INDEPENDENT_AMBULATORY_CARE_PROVIDER_SITE_OTHER): Payer: Medicare Other | Admitting: Podiatry

## 2014-10-16 ENCOUNTER — Encounter: Payer: Self-pay | Admitting: Podiatry

## 2014-10-16 VITALS — BP 128/78 | HR 81 | Resp 16

## 2014-10-16 DIAGNOSIS — Z9889 Other specified postprocedural states: Secondary | ICD-10-CM

## 2014-10-16 DIAGNOSIS — M2011 Hallux valgus (acquired), right foot: Secondary | ICD-10-CM

## 2014-10-17 NOTE — Progress Notes (Signed)
Patient ID: Laurie Wallace, female   DOB: Nov 23, 1945, 69 y.o.   MRN: [id number]  Subjective: 68 year old female presents the office today 2 weeks status post right foot Austin bunionectomy, medial sesamoid excision. She's remained in a Cam Walker without any problems. She states that she has not had any pain to the area and has not required any pain medication. She denies any systemic complaints such as fevers, chills, nausea, vomiting. Denies any calf pain, chest pain, shards of breath.  Objective: AAO 3, NAD DP/PT pulses palpable b/l, CRT < 3 sec Protective sensation intact with Simms was monofilament Incisional a dorsal medial aspect of the right foot as well coapted without any evidence of dehiscence. Sutures are intact. There is no surrounding erythema, drainage, malodor, or any other clinical signs of infection. There is mild overlying edema at the surgical site without any associated erythema her increase in warmth. There is no discomfort with first MTPJ range of motion. No other areas of tenderness to bilateral lower extremities. No open lesions or pre-ulcerative lesions identified bilaterally. No pain with calf compression, swelling, warmth, erythema.  Assessment: 69 year old female 2 weeks status post right foot Austin bunionectomy.  Plan: -At this appointment the sutures are removed without complications. -Antibiotic ointment was applied followed by dry sterile dressing. Discussed the patient she can start showering in 24 hours unless there is a problem with the incision to hold off on showering call the office. -Continue Cam Walker. -Ice/elevation -Pain medication as needed -Continue monitor for any clinical signs or symptoms of infection and DVT/PE. If any are to occur and call the office or go directly to the emergency room. -Follow-up in 2 weeks for repeat x-rays of the right foot or sooner if any palms are to arise. In the meantime encouraged call the office with any  questions, concerns, changes symptoms. We'll likely transition to a surgical shoe next appointment.

## 2014-10-30 ENCOUNTER — Ambulatory Visit (INDEPENDENT_AMBULATORY_CARE_PROVIDER_SITE_OTHER): Payer: Medicare Other | Admitting: Podiatry

## 2014-10-30 ENCOUNTER — Ambulatory Visit (INDEPENDENT_AMBULATORY_CARE_PROVIDER_SITE_OTHER): Payer: Medicare Other

## 2014-10-30 VITALS — BP 123/67 | HR 89 | Resp 16

## 2014-10-30 DIAGNOSIS — M2011 Hallux valgus (acquired), right foot: Secondary | ICD-10-CM | POA: Diagnosis not present

## 2014-10-30 DIAGNOSIS — Z9889 Other specified postprocedural states: Secondary | ICD-10-CM

## 2014-10-30 NOTE — Progress Notes (Signed)
Patient ID: Laurie Wallace, female   DOB: 1946/03/25, 69 y.o.   MRN: [id number]  Subjective: Laurie Wallace presents the office today 4 weeks status post right foot Austin bunionectomy, medial sesamoid excision. She continues with the CAM Walker without any problems. She states that she continues to not have any pain to the area and has not required any pain medication. She denies any systemic complaints such as fevers, chills, nausea, vomiting. Denies any calf pain, chest pain, shards of breath.  Objective: AAO 3, NAD DP/PT pulses palpable b/l, CRT < 3 sec Protective sensation intact with Simms was monofilament Incisional a dorsal medial aspect of the right foot as well coapted without any evidence of dehiscence. There is no surrounding erythema, drainage, malodor, or any other clinical signs of infection. There is mild overlying edema at the surgical site without any associated erythema her increase in warmth. The edema appears to be improving. There is no discomfort with first MTPJ range of motion however the motion is somewhat limited. No other areas of tenderness to bilateral lower extremities. No open lesions or pre-ulcerative lesions identified bilaterally. No pain with calf compression, swelling, warmth, erythema.  Assessment: 69 year old female 4 weeks status post right foot Austin bunionectomy.  Plan: -Treatment options discussed including all alternatives, risks, complications -Can transition into a surgical shoe which was dispensed to her today -Ice/elevation -ROM exercises which were discussed with her.  -Continue monitor for any clinical signs or symptoms of infection and DVT/PE. If any are to occur and call the office or go directly to the emergency room. -Follow-up in 2 weeks for repeat x-rays of the right foot or sooner if any problems are to arise. In the meantime encouraged call the office with any questions, concerns, changes symptoms. We'll likely transition to a  regular loose fitting supportive shoe at next appointment.

## 2014-11-13 ENCOUNTER — Ambulatory Visit (INDEPENDENT_AMBULATORY_CARE_PROVIDER_SITE_OTHER): Payer: Medicare Other

## 2014-11-13 ENCOUNTER — Ambulatory Visit (INDEPENDENT_AMBULATORY_CARE_PROVIDER_SITE_OTHER): Payer: Medicare Other | Admitting: Podiatry

## 2014-11-13 VITALS — BP 122/66 | HR 74 | Resp 16

## 2014-11-13 DIAGNOSIS — Z9889 Other specified postprocedural states: Secondary | ICD-10-CM | POA: Diagnosis not present

## 2014-11-16 NOTE — Progress Notes (Signed)
Patient ID: Laurie Wallace, female   DOB: 1946-02-16, 69 y.o.   MRN: [id number]  Subjective: Laurie Wallace presents the office today 6 weeks status post right foot Austin bunionectomy, medial sesamoid excision. She continues with the surgical shoe without any problems. She states that she continues to not have any pain to the area and has not required any pain medication. She has not been doing the ROM activities much as she is "scared" to do so and hurt something. She denies any systemic complaints such as fevers, chills, nausea, vomiting. Denies any calf pain, chest pain, shards of breath.  Objective: AAO 3, NAD DP/PT pulses palpable b/l, CRT < 3 sec Protective sensation intact with Simms was monofilament Incisional on dorsal medial aspect of the right foot as well coapted without any evidence of dehiscence. There is no surrounding erythema, drainage, malodor, or any other clinical signs of infection. There is trace overlying edema at the surgical site without any associated erythema or increase in warmth. There is no discomfort with first MTPJ range of motion however the motion is somewhat limited. No other areas of tenderness to bilateral lower extremities. No open lesions or pre-ulcerative lesions identified bilaterally. No pain with calf compression, swelling, warmth, erythema.  Assessment: 69 year old female 6 weeks status post right foot Austin bunionectomy.  Plan: -X-rays were obtained and reviewed with the patient.  -Treatment options discussed including all alternatives, risks, complications -Can transition into a regular shoe as tolerated.  -Ice/elevation -ROM exercises which were discussed with her.  -Continue monitor for any clinical signs or symptoms of infection and DVT/PE. If any are to occur and call the office or go directly to the emergency room. -Follow-up in 2 weeks or sooner if any problems are to arise. In the meantime encouraged call the office with any questions,  concerns, changes symptoms.

## 2014-11-27 ENCOUNTER — Encounter: Payer: Self-pay | Admitting: *Deleted

## 2014-11-27 ENCOUNTER — Encounter: Payer: Self-pay | Admitting: Podiatry

## 2014-11-27 ENCOUNTER — Ambulatory Visit (INDEPENDENT_AMBULATORY_CARE_PROVIDER_SITE_OTHER): Payer: Medicare Other | Admitting: Podiatry

## 2014-11-27 ENCOUNTER — Ambulatory Visit (INDEPENDENT_AMBULATORY_CARE_PROVIDER_SITE_OTHER): Payer: Medicare Other

## 2014-11-27 DIAGNOSIS — Z9889 Other specified postprocedural states: Secondary | ICD-10-CM

## 2014-11-27 DIAGNOSIS — M2011 Hallux valgus (acquired), right foot: Secondary | ICD-10-CM

## 2014-11-28 NOTE — Progress Notes (Signed)
Patient ID: SUZZANE QUILTER, female   DOB: [birth year], 69 y.o.   MRN: [id number]  Subjective: Ms. Stenner presents the office today 8 weeks status post right foot Austin bunionectomy, medial sesamoid excision. She states that she will transition to regular shoe while at home all although she does wear surgical shoe when going out. She states that she feels fine wearing the regular shoe although she is scared to wear it all the time. She states that she continues to not have any pain to the area and has not required any pain medication. She has been doing the range of motion activities. She denies any systemic complaints such as fevers, chills, nausea, vomiting. Denies any calf pain, chest pain, shards of breath.  Objective: AAO 3, NAD DP/PT pulses palpable b/l, CRT < 3 sec Protective sensation intact with Simms was monofilament Incisional on dorsal medial aspect of the right foot as well coapted without any evidence of dehiscence. There is no surrounding erythema, drainage, malodor, or any other clinical signs of infection. There is no edema overlying at the surgical site and there is no associated erythema or increase in warmth. There is no discomfort with first MTPJ range of motion. Range of motion does appear to be improved compared to last appointment. No other areas of tenderness to bilateral lower extremities. No open lesions or pre-ulcerative lesions identified bilaterally. No pain with calf compression, swelling, warmth, erythema.  Assessment: 69 year old female 8 weeks status post right foot Austin bunionectomy.  Plan: -X-rays were obtained and reviewed with the patient.  -Treatment options discussed including all alternatives, risks, complications -Can transition into a regular shoe full time as tolerated. -Ice/elevation -ROM exercises which were discussed with her.  -Continue monitor for any clinical signs or symptoms of infection and DVT/PE. If any are to occur and call the  office or go directly to the emergency room. -Follow-up in 2 weeks or sooner if any problems are to arise. In the meantime encouraged call the office with any questions, concerns, changes symptoms. At that time we'll likely release to go back to work wearing a shoe full time.

## 2014-12-05 ENCOUNTER — Other Ambulatory Visit: Payer: Self-pay | Admitting: Podiatry

## 2014-12-10 ENCOUNTER — Telehealth: Payer: Self-pay | Admitting: *Deleted

## 2014-12-10 NOTE — Telephone Encounter (Signed)
Pt requested refill of Keflex.  I informed pt the Keflex could not be refilled without her being reevaluated by Dr. Jacqualyn Posey, and reminded her of her 12/10/2104 at 1030am.  Pt states she's doing fine and only wanted to have the Keflex on hand because she has some swelling.

## 2014-12-11 ENCOUNTER — Ambulatory Visit (INDEPENDENT_AMBULATORY_CARE_PROVIDER_SITE_OTHER): Payer: Medicare Other

## 2014-12-11 ENCOUNTER — Ambulatory Visit (INDEPENDENT_AMBULATORY_CARE_PROVIDER_SITE_OTHER): Payer: Medicare Other | Admitting: Podiatry

## 2014-12-11 VITALS — BP 120/78 | HR 66 | Resp 16

## 2014-12-11 DIAGNOSIS — Z9889 Other specified postprocedural states: Secondary | ICD-10-CM

## 2014-12-11 DIAGNOSIS — M2011 Hallux valgus (acquired), right foot: Secondary | ICD-10-CM

## 2014-12-11 NOTE — Progress Notes (Signed)
Patient ID: Laurie Wallace, female   DOB: 09-14-1945, 69 y.o.   MRN: [id number]  Subjective: Laurie Wallace presents the office today 10 weeks status post right foot Austin bunionectomy, medial sesamoid excision. She states that she will transition to regular shoe while at home and while going out short distances. She does continue to wear the surgical shoe if she is doing a lot of walking because she is fearful she is going to injury something. She states that she is also return to work for which she wears a shoe hepatitis work and she was a surgical shoe the other half. She does that she gets intermittent swelling along the surgical site but denies any redness or any warmth to the area. She denies any systemic complaints such as fevers, chills, nausea, vomiting. Denies any calf pain, chest pain, shards of breath.  Objective: AAO 3, NAD DP/PT pulses palpable b/l, CRT < 3 sec Protective sensation intact with Simms was monofilament Incision on dorsal medial aspect of the right foot as well coapted without any evidence of dehiscence and a scar is well formed. There is no surrounding erythema, drainage, malodor, or any other clinical signs of infection. There is minimal edema overlying at the surgical site however there is no associated erythema or increase in warmth. There is no discomfort with first MTPJ range of motion. No other areas of tenderness to bilateral lower extremities. No open lesions or pre-ulcerative lesions identified bilaterally. No pain with calf compression, swelling, warmth, erythema.  Assessment: 69 year old female 10 weeks status post right foot Austin bunionectomy.  Plan: -X-rays were obtained and reviewed with the patient.  -Treatment options discussed including all alternatives, risks, complications -Can continue to transition into a regular shoe full time as tolerated. -Ice/elevation -Continue ROM exercises -Continue monitor for any clinical signs or symptoms of  infection and DVT/PE. If any are to occur and call the office or go directly to the emergency room. -Follow-up in 4 weeks or sooner if any problems are to arise. In the meantime encouraged call the office with any questions, concerns, changes symptoms.

## 2015-01-15 ENCOUNTER — Ambulatory Visit: Payer: Medicare Other

## 2015-01-15 ENCOUNTER — Encounter: Payer: Self-pay | Admitting: Podiatry

## 2015-01-15 ENCOUNTER — Ambulatory Visit (INDEPENDENT_AMBULATORY_CARE_PROVIDER_SITE_OTHER): Payer: Medicare Other | Admitting: Podiatry

## 2015-01-15 ENCOUNTER — Ambulatory Visit (INDEPENDENT_AMBULATORY_CARE_PROVIDER_SITE_OTHER): Payer: Medicare Other

## 2015-01-15 VITALS — BP 121/71 | HR 71 | Resp 16

## 2015-01-15 DIAGNOSIS — Z9889 Other specified postprocedural states: Secondary | ICD-10-CM

## 2015-01-15 DIAGNOSIS — M2011 Hallux valgus (acquired), right foot: Secondary | ICD-10-CM

## 2015-01-17 NOTE — Progress Notes (Signed)
Patient ID: JAZZELLE ZHANG, female   DOB: Dec 05, 1945, 69 y.o.   MRN: [id number]  Subjective: Laurie Wallace presents the office today status post right foot Austin bunionectomy, medial sesamoid excision. She states that she has returned to regular shoe gear and she doesn't work. She states that she doesn't have any discomfort along the surgical site and she is doing well. She does continue some intermittent swelling as she is on her feet quite a bit however denies any pain or redness. She denies any systemic complaints such as fevers, chills, nausea, vomiting. Denies any calf pain, chest pain, shards of breath.  Objective: AAO 3, NAD DP/PT pulses palpable b/l, CRT < 3 sec Protective sensation intact with Simms was monofilament Incision on dorsal medial aspect of the right foot as well coapted without any evidence of dehiscence and a scar is well formed. There is no surrounding erythema, drainage, malodor, or any other clinical signs of infection. There is minimal edema overlying at the surgical site however there is no associated erythema or increase in warmth. There is no discomfort with first MTPJ range of motion although it is somewhat limited. The first metatarsal. Being good position of the hallux was drifted laterally. Submetatarsal 2 hyperkeratotic lesion on the right foot.  No other areas of tenderness to bilateral lower extremities. No open lesions or pre-ulcerative lesions identified bilaterally. No pain with calf compression, swelling, warmth, erythema.  Assessment: 69 year old female  Plan: -X-rays were obtained and reviewed with the patient.  -Treatment options discussed including all alternatives, risks, complications -Can continue to transition into a regular shoe full time as tolerated. -Continue ROM exercises -Discussed the patient in the future she may need an Akin osteotomy if the toe dressings bothering her. -Also discussed she may need an orthotic to help offload the  second MTPJ. -Continue monitor for any clinical signs or symptoms of infection and DVT/PE. If any are to occur and call the office or go directly to the emergency room. -Follow-up in 4 weeks or sooner if any problems are to arise. In the meantime encouraged call the office with any questions, concerns, changes symptoms.   Celesta Gentile, DPM

## 2015-02-12 ENCOUNTER — Ambulatory Visit (INDEPENDENT_AMBULATORY_CARE_PROVIDER_SITE_OTHER): Payer: BLUE CROSS/BLUE SHIELD

## 2015-02-12 ENCOUNTER — Ambulatory Visit (INDEPENDENT_AMBULATORY_CARE_PROVIDER_SITE_OTHER): Payer: BLUE CROSS/BLUE SHIELD | Admitting: Podiatry

## 2015-02-12 ENCOUNTER — Encounter: Payer: Self-pay | Admitting: Podiatry

## 2015-02-12 VITALS — BP 134/75 | HR 70 | Resp 17

## 2015-02-12 DIAGNOSIS — Z9889 Other specified postprocedural states: Secondary | ICD-10-CM

## 2015-02-12 DIAGNOSIS — M21611 Bunion of right foot: Secondary | ICD-10-CM

## 2015-02-12 DIAGNOSIS — L84 Corns and callosities: Secondary | ICD-10-CM

## 2015-02-12 DIAGNOSIS — M2011 Hallux valgus (acquired), right foot: Secondary | ICD-10-CM | POA: Diagnosis not present

## 2015-02-12 NOTE — Progress Notes (Signed)
Patient ID: Laurie Wallace, female   DOB: 12-14-1945, 69 y.o.   MRN: [id number]  Subjective: Laurie Wallace presents the office today status post right foot Austin bunionectomy, medial sesamoid excision. Since last appointment she has continued regular shoe gear without any problems. She gets some occasional discomfort in the ball of her foot however is intermittent in nature. She denies any numbness or tingling. Denies any swelling or any redness. She denies any systemic complaints such as fevers, chills, nausea, vomiting. Denies any calf pain, chest pain, shards of breath.  Objective: AAO 3, NAD DP/PT pulses palpable b/l, CRT < 3 sec Protective sensation intact with Simms was monofilament Incision on dorsal medial aspect of the right foot as well coapted without any evidence of dehiscence and a scar is well formed. There is no surrounding erythema, drainage, malodor, or any other clinical signs of infection. There is trace edema overlying at the surgical site withoutany associated erythema or increase in warmth. There is no discomfort with first MTPJ range of motion. there is lateral deviation of the hallux although there is adequate correction of the bunion deformity itself of the first metatarsal.  Submetatarsal 2 hyperkeratotic lesion on the right foot. No other areas of tenderness to bilateral lower extremities.  No open lesions or  other pre-ulcerative lesions identified bilaterally.  No pain with calf compression, swelling, warmth, erythema.  Assessment: 69 year old female status post right foot bunionectomy   Plan: -X-rays were obtained and reviewed with the patient.  offset appears to be healed.  -Treatment options discussed including all alternatives, risks, complications -Can continue to transition into a regular shoe full time as tolerated. -Continue with orthotics which she got from Dr. Gershon Mussel. I added a pad to help offload the submetatarsal 2 area. -Continue monitor for any  clinical signs or symptoms of infection and DVT/PE. If any are to occur and call the office or go directly to the emergency room. -Follow-up as needed or if any problems are to arise. In the meantime encouraged call the office with any questions, concerns, changes symptoms.   Celesta Gentile, DPM

## 2015-02-15 ENCOUNTER — Ambulatory Visit
Admission: EM | Admit: 2015-02-15 | Discharge: 2015-02-15 | Disposition: A | Discharge status: Home / Self Care | Payer: BLUE CROSS/BLUE SHIELD | Attending: Family Medicine | Admitting: Family Medicine

## 2015-02-15 ENCOUNTER — Encounter: Payer: Self-pay | Admitting: Emergency Medicine

## 2015-02-15 DIAGNOSIS — J0100 Acute maxillary sinusitis, unspecified: Secondary | ICD-10-CM

## 2015-02-15 MED ORDER — AMOXICILLIN 875 MG PO TABS: 875.0000 mg | ORAL_TABLET | Freq: Two times a day (BID) | ORAL | Status: DC

## 2015-02-15 MED ORDER — BENZONATATE 200 MG PO CAPS: 200.0000 mg | ORAL_CAPSULE | Freq: Three times a day (TID) | ORAL | Status: DC | PRN

## 2015-02-15 NOTE — ED Provider Notes (Signed)
CSN: [id number]     Arrival date & time 02/15/15  0919 History   First MD Initiated Contact with Patient 02/15/15 1050     Chief Complaint  Patient presents with  . Facial Pain  . Cough  . Nasal Congestion   (Consider location/radiation/quality/duration/timing/severity/associated sxs/prior Treatment) Patient is a 69 y.o. female presenting with cough. The history is provided by the patient.  Cough Cough characteristics:  Non-productive Severity:  Moderate Onset quality:  Sudden Duration:  3 weeks Timing:  Constant Progression:  Worsening Chronicity:  New Smoker: no   Context: exposure to allergens   Associated symptoms: rhinorrhea, sinus congestion and wheezing   Associated symptoms: no chest pain, no ear fullness, no ear pain, no eye discharge and no fever     Past Medical History  Diagnosis Date  . Arthritis   . Allergy    Past Surgical History  Procedure Laterality Date  . Abdominal hysterectomy    . Toe surgery Right     1st toe   History reviewed. No pertinent family history. Social History  Substance Use Topics  . Smoking status: Never Smoker   . Smokeless tobacco: None  . Alcohol Use: No   OB History    No data available     Review of Systems  Constitutional: Negative for fever.  HENT: Positive for rhinorrhea. Negative for ear pain.   Eyes: Negative for discharge.  Respiratory: Positive for cough and wheezing.   Cardiovascular: Negative for chest pain.    Allergies  Review of patient's allergies indicates no known allergies.  Home Medications   Prior to Admission medications   Medication Sig Start Date End Date Taking? Authorizing Provider  amoxicillin (AMOXIL) 875 MG tablet Take 1 tablet (875 mg total) by mouth 2 (two) times daily. 02/15/15   Norval Gable, MD  benzonatate (TESSALON) 200 MG capsule Take 1 capsule (200 mg total) by mouth 3 (three) times daily as needed for cough. 02/15/15   Norval Gable, MD  brompheniramine (VAZOL) 2 MG/5ML LIQD  Take 2 mg by mouth 2 (two) times daily.    Historical Provider, MD  cephALEXin (KEFLEX) 500 MG capsule Take 500 mg by mouth 3 (three) times daily.    Trula Slade, DPM  fluticasone (FLONASE) 50 MCG/ACT nasal spray Place 2 sprays into the nose daily.      Historical Provider, MD  oxyCODONE-acetaminophen (PERCOCET/ROXICET) 5-325 MG per tablet Take 1 tablet by mouth every 4 (four) hours as needed for severe pain.    Trula Slade, DPM  promethazine (PHENERGAN) 12.5 MG tablet Take 12.5 mg by mouth every 6 (six) hours as needed for nausea or vomiting.    Trula Slade, DPM  Pseudoephedrine-Acetaminophen (EQ SINUS MAXIMUM STRENGTH PO) Take 1 tablet by mouth daily as needed. For sinus congestion     Historical Provider, MD   BP 121/85 mmHg  Pulse 60  Temp(Src) 96.9 F (36.1 C) (Tympanic)  Resp 16  Ht 5\' 6"  (1.676 m)  Wt 130 lb (58.968 kg)  BMI 20.99 kg/m2  SpO2 100% Physical Exam  Constitutional: She appears well-developed and well-nourished. No distress.  HENT:  Head: Normocephalic and atraumatic.  Right Ear: Tympanic membrane, external ear and ear canal normal.  Left Ear: Tympanic membrane, external ear and ear canal normal.  Nose: Mucosal edema and rhinorrhea present. No nose lacerations, sinus tenderness, nasal deformity, septal deviation or nasal septal hematoma. No epistaxis.  No foreign bodies. Right sinus exhibits maxillary sinus tenderness and frontal sinus tenderness.  Left sinus exhibits maxillary sinus tenderness and frontal sinus tenderness.  Mouth/Throat: Uvula is midline, oropharynx is clear and moist and mucous membranes are normal. No oropharyngeal exudate.  Eyes: Conjunctivae and EOM are normal. Pupils are equal, round, and reactive to light. Right eye exhibits no discharge. Left eye exhibits no discharge. No scleral icterus.  Neck: Normal range of motion. Neck supple. No thyromegaly present.  Cardiovascular: Normal rate, regular rhythm and normal heart sounds.    Pulmonary/Chest: Effort normal and breath sounds normal. No respiratory distress. She has no wheezes. She has no rales.  Lymphadenopathy:    She has no cervical adenopathy.  Skin: She is not diaphoretic.  Nursing note and vitals reviewed.   ED Course  Procedures (including critical care time) Labs Review Labs Reviewed - No data to display  Imaging Review No results found.   MDM   1. Acute maxillary sinusitis, recurrence not specified    Discharge Medication List as of 02/15/2015 11:01 AM    START taking these medications   Details  amoxicillin (AMOXIL) 875 MG tablet Take 1 tablet (875 mg total) by mouth 2 (two) times daily., Starting 02/15/2015, Until Discontinued, Normal    benzonatate (TESSALON) 200 MG capsule Take 1 capsule (200 mg total) by mouth 3 (three) times daily as needed for cough., Starting 02/15/2015, Until Discontinued, Normal      Plan: 1.  diagnosis reviewed with patient 2. rx as per orders; risks, benefits, potential side effects reviewed with patient 3. Recommend supportive treatment with otc flonase 4. F/u prn if symptoms worsen or don't improve    Norval Gable, MD 02/15/15 1102

## 2015-02-15 NOTE — ED Notes (Signed)
Patient c/o nonproductive cough, runny nose, and sinus pressure for a month.  Patient denies fevers.

## 2017-08-15 ENCOUNTER — Emergency Department (HOSPITAL_COMMUNITY)
Admission: EM | Admit: 2017-08-15 | Discharge: 2017-08-15 | Disposition: A | Discharge status: Home / Self Care | Payer: BLUE CROSS/BLUE SHIELD | Attending: Emergency Medicine | Admitting: Emergency Medicine

## 2017-08-15 ENCOUNTER — Other Ambulatory Visit: Payer: Self-pay

## 2017-08-15 ENCOUNTER — Encounter (HOSPITAL_COMMUNITY): Payer: Self-pay | Admitting: Emergency Medicine

## 2017-08-15 DIAGNOSIS — Y998 Other external cause status: Secondary | ICD-10-CM | POA: Diagnosis not present

## 2017-08-15 DIAGNOSIS — Z79899 Other long term (current) drug therapy: Secondary | ICD-10-CM | POA: Insufficient documentation

## 2017-08-15 DIAGNOSIS — R110 Nausea: Secondary | ICD-10-CM | POA: Diagnosis not present

## 2017-08-15 DIAGNOSIS — X500XXA Overexertion from strenuous movement or load, initial encounter: Secondary | ICD-10-CM | POA: Diagnosis not present

## 2017-08-15 DIAGNOSIS — Y92009 Unspecified place in unspecified non-institutional (private) residence as the place of occurrence of the external cause: Secondary | ICD-10-CM | POA: Diagnosis not present

## 2017-08-15 DIAGNOSIS — S39012A Strain of muscle, fascia and tendon of lower back, initial encounter: Secondary | ICD-10-CM

## 2017-08-15 DIAGNOSIS — S3992XA Unspecified injury of lower back, initial encounter: Secondary | ICD-10-CM | POA: Diagnosis present

## 2017-08-15 DIAGNOSIS — Y9389 Activity, other specified: Secondary | ICD-10-CM | POA: Diagnosis not present

## 2017-08-15 MED ORDER — NAPROXEN 500 MG PO TABS: 500.0000 mg | ORAL_TABLET | Freq: Two times a day (BID) | ORAL | 0 refills | Status: DC

## 2017-08-15 MED ORDER — IBUPROFEN 400 MG PO TABS
400.0000 mg | ORAL_TABLET | Freq: Once | ORAL | Status: AC
  Administered 2017-08-15: 400 mg via ORAL
  Filled 2017-08-15: qty 1

## 2017-08-15 NOTE — Discharge Instructions (Signed)
Alternate ice and heat to your back.  Avoid bending over or lifting heavy objects for 7 days.  Follow-up with your primary doctor for recheck return to the ER for any worsening symptoms or abdominal pain, fever, and vomiting

## 2017-08-15 NOTE — ED Provider Notes (Signed)
[facility] Provider Note   CSN: [id number] Arrival date & time: 08/15/17  1112     History   Chief Complaint Chief Complaint  Patient presents with  . Back Pain    HPI Laurie Wallace is a 72 y.o. female.  HPI   Laurie Wallace is a 72 y.o. female who presents to the Emergency Department complaining of right-sided low back pain since Friday.  She states that she bent over to pick up a bag of laundry detergent and felt a sharp pain to her lower back.  Pain is associated with movement only.  Pain improves at rest.  She states the pain radiates toward her hip and not into her abdomen.  She states that she was seen yesterday at a urgent care center and also treated with antibiotics for a sinus infection.  She states that she mentioned to her back pain to provider there, but was told that they did not address pain symptoms.  She denies pain radiating into her leg, numbness or weakness of the extremities, urine or bowel changes, fever or chills.  She has not tried any medications or therapies prior to ER arrival.   Past Medical History:  Diagnosis Date  . Allergy   . Arthritis     Patient Active Problem List   Diagnosis Date Noted  . Displacement of lumbar intervertebral disc without myelopathy 04/07/2010  . LUMBAR SPRAIN AND STRAIN 12/04/2009    Past Surgical History:  Procedure Laterality Date  . ABDOMINAL HYSTERECTOMY    . TOE SURGERY Right    1st toe    OB History    No data available       Home Medications    Prior to Admission medications   Medication Sig Start Date End Date Taking? Authorizing Provider  amoxicillin (AMOXIL) 875 MG tablet Take 1 tablet (875 mg total) by mouth 2 (two) times daily. 02/15/15   Norval Gable, MD  benzonatate (TESSALON) 200 MG capsule Take 1 capsule (200 mg total) by mouth 3 (three) times daily as needed for cough. 02/15/15   Norval Gable, MD  brompheniramine (VAZOL) 2 MG/5ML LIQD Take 2 mg by  mouth 2 (two) times daily.    [provider]  cephALEXin (KEFLEX) 500 MG capsule Take 500 mg by mouth 3 (three) times daily.    Trula Slade, DPM  fluticasone (FLONASE) 50 MCG/ACT nasal spray Place 2 sprays into the nose daily.      [provider]  oxyCODONE-acetaminophen (PERCOCET/ROXICET) 5-325 MG per tablet Take 1 tablet by mouth every 4 (four) hours as needed for severe pain.    Trula Slade, DPM  promethazine (PHENERGAN) 12.5 MG tablet Take 12.5 mg by mouth every 6 (six) hours as needed for nausea or vomiting.    Trula Slade, DPM  Pseudoephedrine-Acetaminophen (EQ SINUS MAXIMUM STRENGTH PO) Take 1 tablet by mouth daily as needed. For sinus congestion     [provider]    Family History History reviewed. No pertinent family history.  Social History Social History   Tobacco Use  . Smoking status: Never Smoker  Substance Use Topics  . Alcohol use: No  . Drug use: No     Allergies   Patient has no known allergies.   Review of Systems Review of Systems  Constitutional: Negative for fever.  Respiratory: Negative for shortness of breath.   Gastrointestinal: Negative for abdominal pain, constipation, nausea and vomiting.  Genitourinary: Negative for decreased urine volume,  difficulty urinating, dysuria, flank pain and hematuria.  Musculoskeletal: Positive for back pain. Negative for joint swelling and neck pain.  Skin: Negative for rash.  Neurological: Negative for weakness and numbness.  All other systems reviewed and are negative.    Physical Exam Updated Vital Signs BP 140/82 (BP Location: Left Arm)   Pulse 80   Temp 98.4 F (36.9 C) (Oral)   Resp 18   Ht 5\' 6"  (1.676 m)   Wt 59 kg (130 lb)   SpO2 100%   BMI 20.98 kg/m   Physical Exam  Constitutional: She is oriented to person, place, and time. She appears well-developed and well-nourished. No distress.  HENT:  Head: Normocephalic and atraumatic.  Neck: Normal  range of motion. Neck supple.  Cardiovascular: Normal rate, regular rhythm and intact distal pulses.  DP pulses are strong and palpable bilaterally  Pulmonary/Chest: Effort normal and breath sounds normal. No respiratory distress.  Abdominal: Soft. She exhibits no distension and no mass. There is no tenderness. There is no rebound and no guarding.  Musculoskeletal: She exhibits tenderness. She exhibits no edema.       Lumbar back: She exhibits tenderness and pain. She exhibits normal range of motion, no swelling, no deformity, no laceration and normal pulse.  ttp of the lower lumbar spine and right paraspinal muscles.  Pain reproduced on straight leg raise on the right, pt has 5/5 strength against resistance of bilateral lower extremities.     Neurological: She is alert and oriented to person, place, and time. She has normal strength. No sensory deficit. She exhibits normal muscle tone. Coordination and gait normal.  Reflex Scores:      Patellar reflexes are 2+ on the right side and 2+ on the left side.      Achilles reflexes are 2+ on the right side and 2+ on the left side. Skin: Skin is warm and dry. Capillary refill takes less than 2 seconds. No rash noted.  Psychiatric: She has a normal mood and affect.  Nursing note and vitals reviewed.    ED Treatments / Results  Labs (all labs ordered are listed, but only abnormal results are displayed) Labs Reviewed - No data to display  EKG  EKG Interpretation None       Radiology No results found.  Procedures Procedures (including critical care time)  Medications Ordered in ED Medications - No data to display   Initial Impression / Assessment and Plan / ED Course  I have reviewed the triage vital signs and the nursing notes.  Pertinent labs & imaging results that were available during my care of the patient were reviewed by me and considered in my medical decision making (see chart for details).    Labs reviewed, kidney  functions wnml .    Patient is active and well-appearing.  She is ambulatory with a steady gait.  No focal neuro deficits on exam.  Symptoms are likely musculoskeletal, doubtful of cauda equina or abdominal process.  Abdomen is soft and nontender on my exam and she denies any associated symptoms.  Patient described abdominal pain, but actually points to the right hip.  She agrees to treatment plan with NSAID (short course)  ice and heat to her back.  I have discussed return precautions, She verbalized understanding and agrees to close follow-up and ER return if symptoms worsen  Final Clinical Impressions(s) / ED Diagnoses   Final diagnoses:  Strain of lumbar region, initial encounter    ED Discharge Orders  None       Kem Parkinson, PA-C 08/15/17 [place], [place], DO 08/16/17 [phone]

## 2017-08-15 NOTE — ED Notes (Signed)
  Pt was seen yesterday at Urgent care for a sinus infection and given a nasal spray and two antibiotics  Also spoke with them regarding her back which she injured picking up something- Per pt, they told her they did not deal with pain so she is here for evaluation

## 2017-08-15 NOTE — ED Triage Notes (Addendum)
Patient c/o lower back pain that radiates into right right lower abd. Per patient was bending over and picking something up when pain started. Denies vomiting, diarrhea, or urinary symptoms. Per patient some nausea. Patient states only hurts with turning and standing.

## 2017-08-17 ENCOUNTER — Encounter (HOSPITAL_COMMUNITY): Payer: Self-pay | Admitting: Emergency Medicine

## 2017-08-17 ENCOUNTER — Emergency Department (HOSPITAL_COMMUNITY)
Admission: EM | Admit: 2017-08-17 | Discharge: 2017-08-17 | Disposition: A | Discharge status: Home / Self Care | Payer: BLUE CROSS/BLUE SHIELD | Attending: Emergency Medicine | Admitting: Emergency Medicine

## 2017-08-17 ENCOUNTER — Other Ambulatory Visit: Payer: Self-pay

## 2017-08-17 DIAGNOSIS — M545 Low back pain: Secondary | ICD-10-CM | POA: Insufficient documentation

## 2017-08-17 DIAGNOSIS — Z5321 Procedure and treatment not carried out due to patient leaving prior to being seen by health care provider: Secondary | ICD-10-CM | POA: Insufficient documentation

## 2017-08-17 NOTE — ED Notes (Signed)
Patient left at 1707.

## 2017-08-17 NOTE — ED Triage Notes (Signed)
Patient c/o lower back pain, was seen here Sunday for the same. Patient states she hasn't had a bowel movement in a couple of days.

## 2017-08-18 ENCOUNTER — Emergency Department (HOSPITAL_COMMUNITY)
Admission: EM | Admit: 2017-08-18 | Discharge: 2017-08-18 | Disposition: A | Discharge status: Home / Self Care | Payer: BLUE CROSS/BLUE SHIELD | Attending: Emergency Medicine | Admitting: Emergency Medicine

## 2017-08-18 ENCOUNTER — Encounter (HOSPITAL_COMMUNITY): Payer: Self-pay | Admitting: Emergency Medicine

## 2017-08-18 ENCOUNTER — Other Ambulatory Visit: Payer: Self-pay

## 2017-08-18 ENCOUNTER — Emergency Department (HOSPITAL_COMMUNITY): Payer: BLUE CROSS/BLUE SHIELD

## 2017-08-18 DIAGNOSIS — M545 Low back pain, unspecified: Secondary | ICD-10-CM

## 2017-08-18 DIAGNOSIS — Z79899 Other long term (current) drug therapy: Secondary | ICD-10-CM | POA: Diagnosis not present

## 2017-08-18 IMAGING — DX DG LUMBAR SPINE COMPLETE 4+V
5 series · 5 of 5 positions shown · non-contrast
Comparison: January 08, 2013

CLINICAL DATA: Pain after sudden motion

EXAM:
LUMBAR SPINE - COMPLETE 4+ VIEW

[l-spine ap]
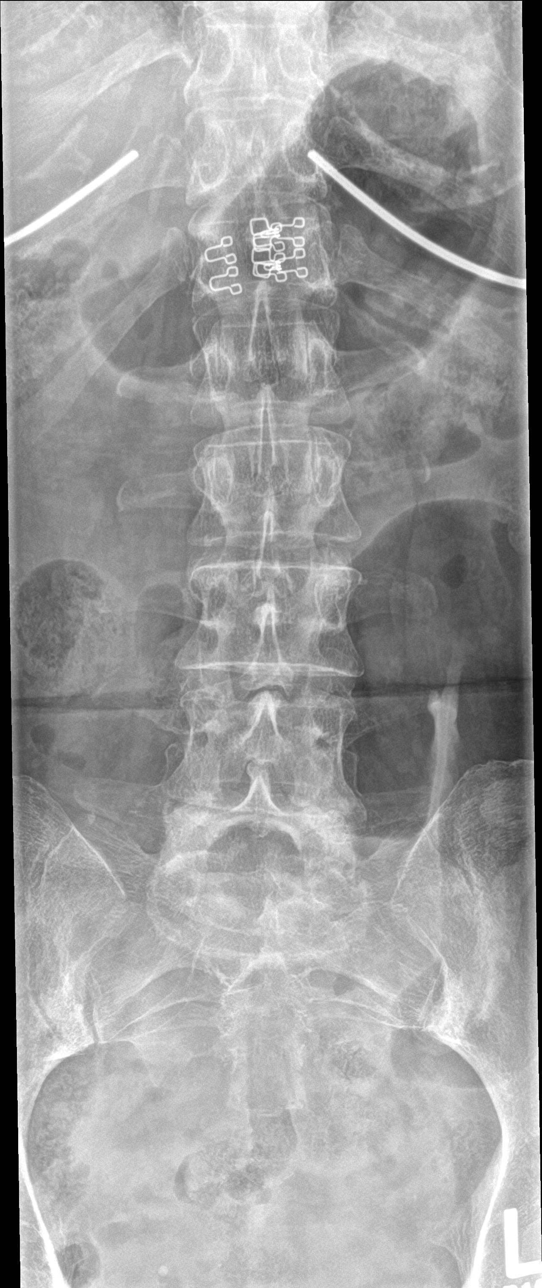

[l-spine obl (1 of 2)]
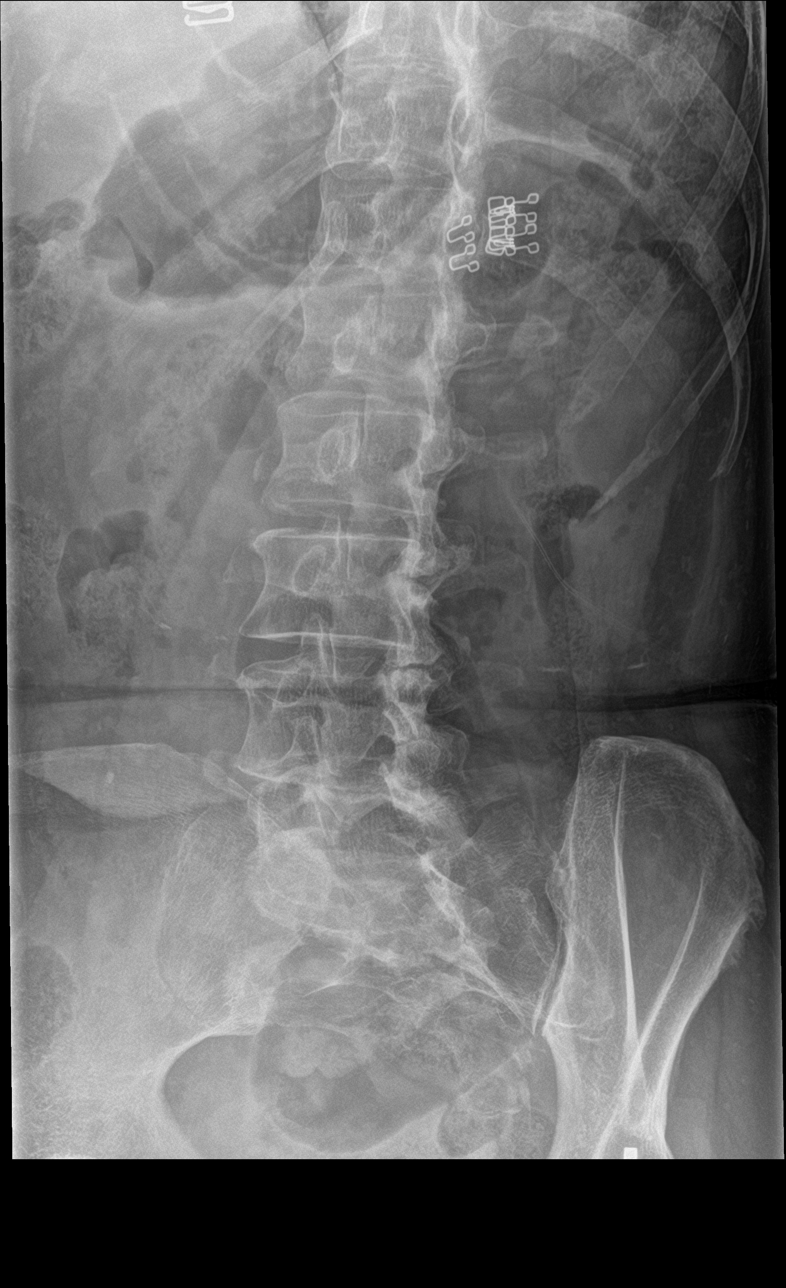

[l-spine obl (2 of 2)]
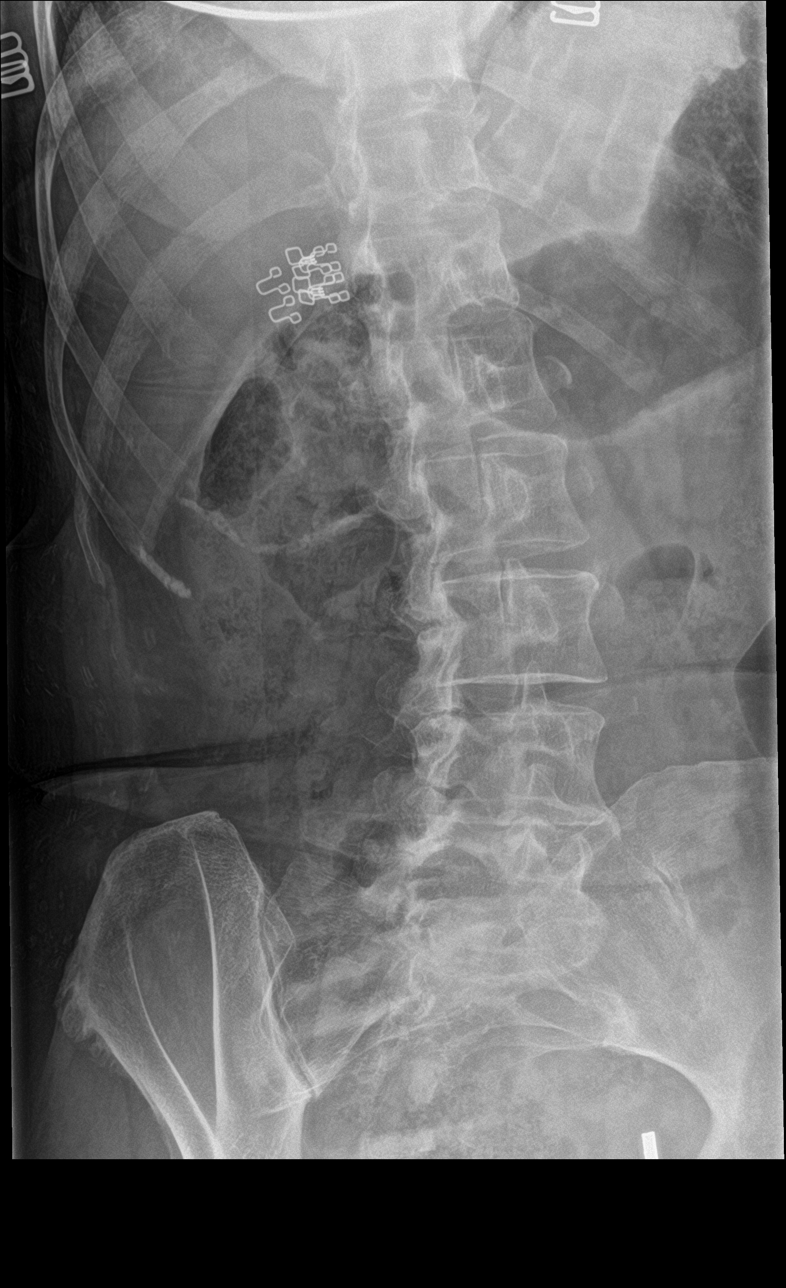

[l-spine lat]
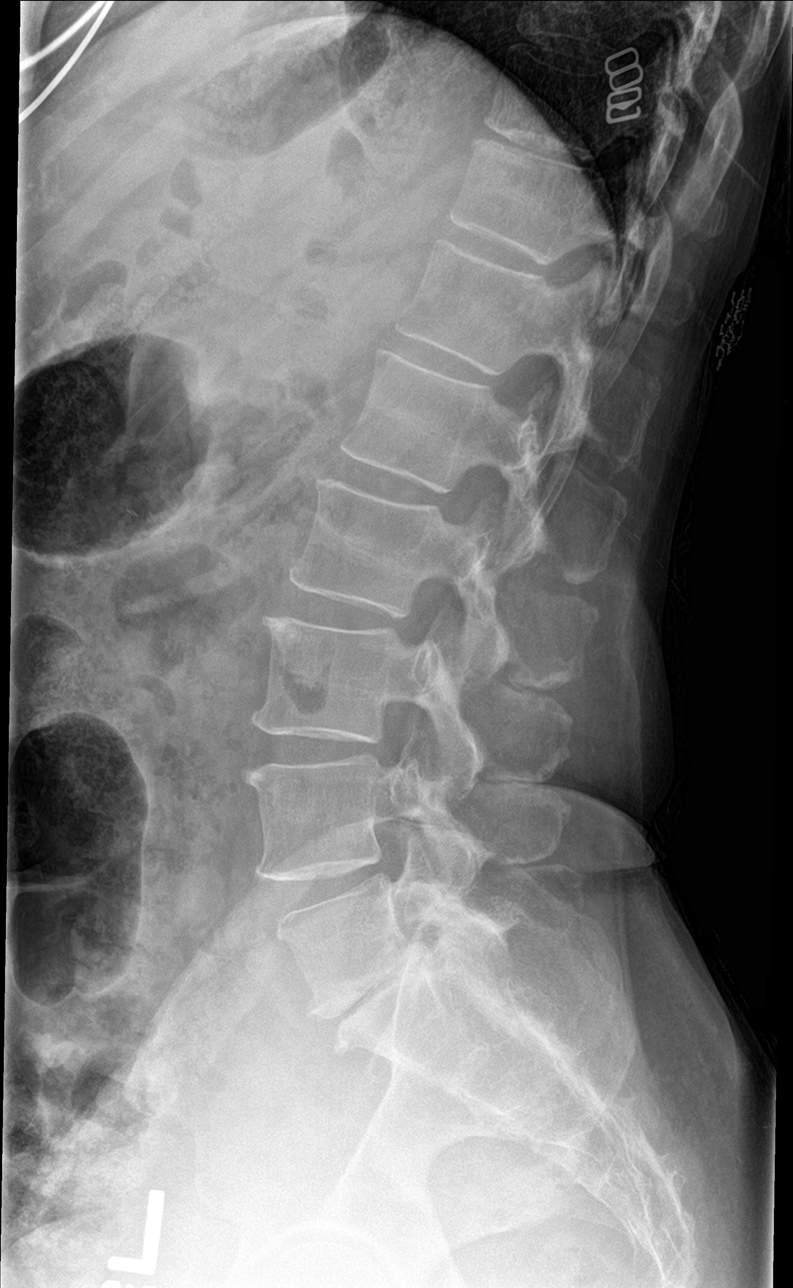

[l-spine spot]
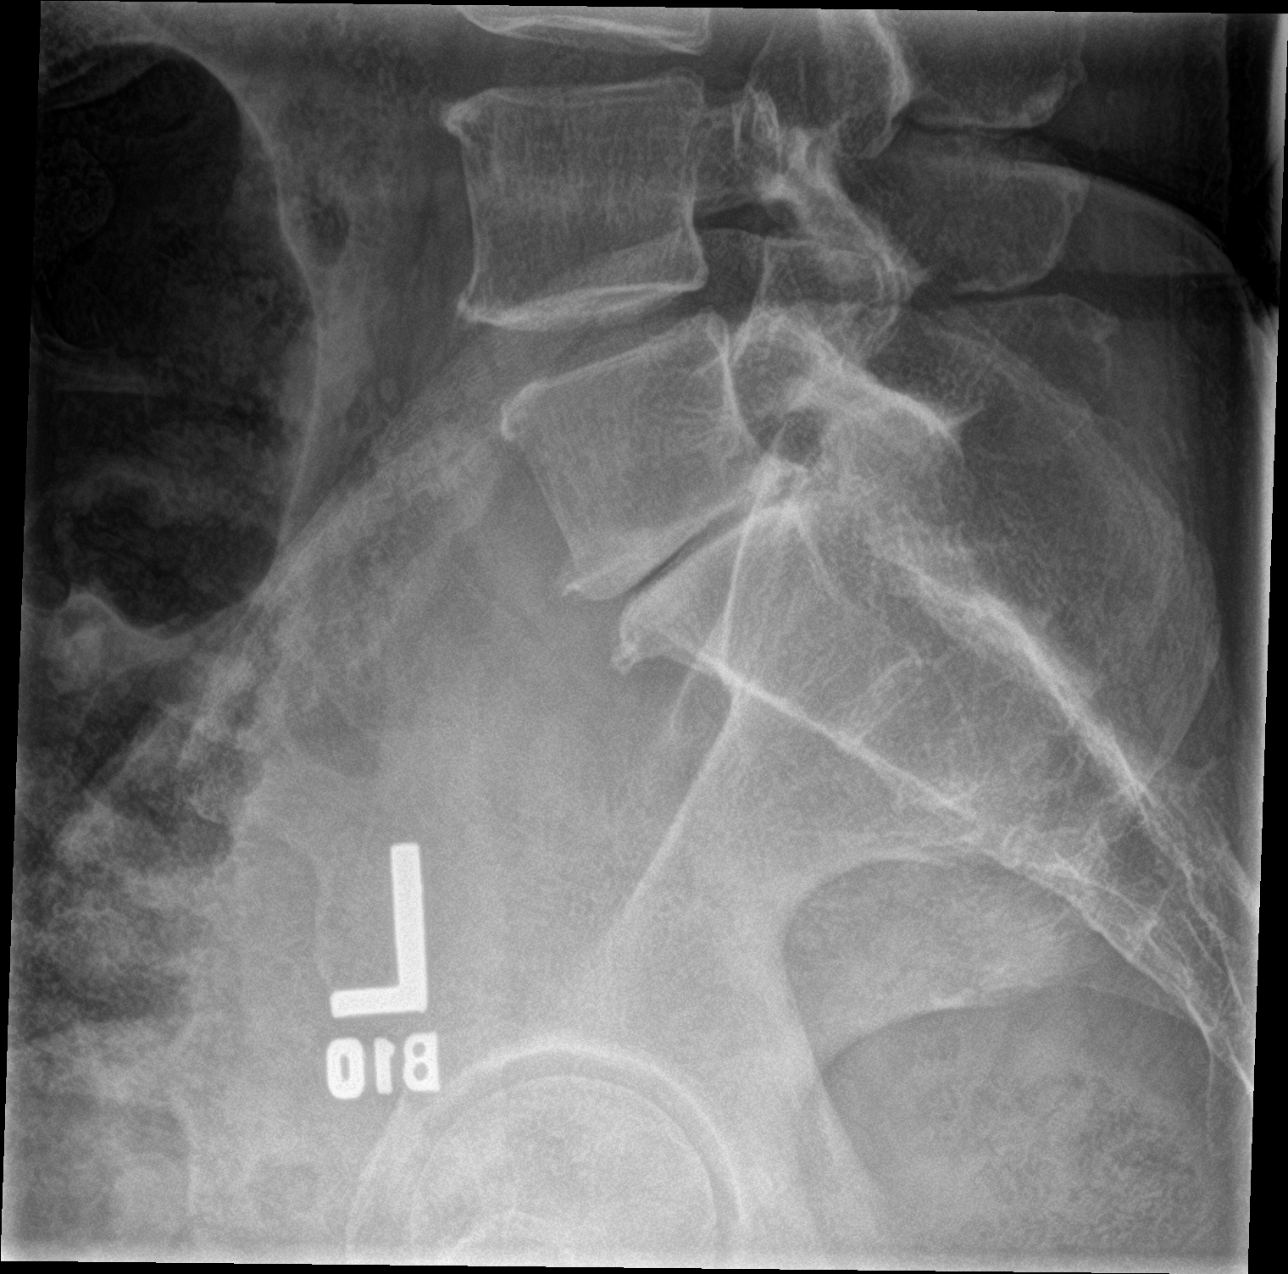

[5 of 5 positions shown; findings below may reference images not displayed]

FINDINGS: Frontal, lateral, spot lumbosacral lateral, and bilateral oblique
views were obtained. There are 5 non-rib-bearing lumbar type
vertebral bodies. No fracture or spondylolisthesis. There is marked
disc space narrowing at L5-S1. Other disc spaces appear
unremarkable. There is facet osteoarthritic change at L5-S1
bilaterally.
IMPRESSION: Osteoarthritic change at L5-S1.  No fracture or spondylolisthesis.

## 2017-08-18 NOTE — Discharge Instructions (Signed)
Take over the counter tylenol, as directed on packaging, as needed for discomfort. Apply moist heat or ice to the area(s) of discomfort, for 15 minutes at a time, several times per day for the next few days.  Do not fall asleep on a heating or ice pack.  Call your regular medical doctor today to schedule a follow up appointment this week.  Return to the Emergency Department immediately if worsening.

## 2017-08-18 NOTE — ED Triage Notes (Signed)
Pt reports lower back pain since last Friday when diagnosed with the flu. Pt reports last BM yesterday after taking miralax. Pt reports "I still cant go like I usually do"

## 2017-08-18 NOTE — ED Provider Notes (Signed)
[facility] EMERGENCY DEPARTMENT Provider Note   CSN: [id number] Arrival date & time: 08/18/17  1660     History   Chief Complaint Chief Complaint  Patient presents with  . Back Pain    HPI Laurie Wallace is a 72 y.o. female.  HPI  Pt was seen at 0905. Per pt, c/o gradual onset and persistence of constant right sided low back "pain" for the past 5 days. Pain worsens with palpation of the area and body position changes. Pain began after she bent over to pick up a bog of laundry detergent. Denies incont/retention of bowel or bladder, no saddle anesthesia, no focal motor weakness, no tingling/numbness in extremities, no fevers, no direct injury, no abd pain.   The symptoms have been associated with no other complaints. The patient has a significant history of similar symptoms previously, recently being evaluated for this complaint. Pt was evaluated in the ED 3 days ago for her symptoms, and states she came back "because I didn't get an xray."    Past Medical History:  Diagnosis Date  . Allergy   . Arthritis     Patient Active Problem List   Diagnosis Date Noted  . Displacement of lumbar intervertebral disc without myelopathy 04/07/2010  . LUMBAR SPRAIN AND STRAIN 12/04/2009    Past Surgical History:  Procedure Laterality Date  . ABDOMINAL HYSTERECTOMY    . TOE SURGERY Right    1st toe    OB History    Gravida Para Term Preterm AB Living   2 2 2     1    SAB TAB Ectopic Multiple Live Births                   Home Medications    Prior to Admission medications   Medication Sig Start Date End Date Taking? Authorizing Provider  amoxicillin (AMOXIL) 875 MG tablet Take 1 tablet (875 mg total) by mouth 2 (two) times daily. 02/15/15   Norval Gable, MD  benzonatate (TESSALON) 200 MG capsule Take 1 capsule (200 mg total) by mouth 3 (three) times daily as needed for cough. 02/15/15   Norval Gable, MD  brompheniramine (VAZOL) 2 MG/5ML LIQD Take 2 mg by mouth 2 (two)  times daily.    [provider]  cephALEXin (KEFLEX) 500 MG capsule Take 500 mg by mouth 3 (three) times daily.    Trula Slade, DPM  fluticasone (FLONASE) 50 MCG/ACT nasal spray Place 2 sprays into the nose daily.      [provider]  naproxen (NAPROSYN) 500 MG tablet Take 1 tablet (500 mg total) by mouth 2 (two) times daily with a meal. 08/15/17   Triplett, Tammy, PA-C  oxyCODONE-acetaminophen (PERCOCET/ROXICET) 5-325 MG per tablet Take 1 tablet by mouth every 4 (four) hours as needed for severe pain.    Trula Slade, DPM  promethazine (PHENERGAN) 12.5 MG tablet Take 12.5 mg by mouth every 6 (six) hours as needed for nausea or vomiting.    Trula Slade, DPM  Pseudoephedrine-Acetaminophen (EQ SINUS MAXIMUM STRENGTH PO) Take 1 tablet by mouth daily as needed. For sinus congestion     [provider]    Family History History reviewed. No pertinent family history.  Social History Social History   Tobacco Use  . Smoking status: Never Smoker  . Smokeless tobacco: Never Used  Substance Use Topics  . Alcohol use: No  . Drug use: No     Allergies   Patient has no known  allergies.   Review of Systems Review of Systems ROS: Statement: All systems negative except as marked or noted in the HPI; Constitutional: Negative for fever and chills. ; ; Eyes: Negative for eye pain, redness and discharge. ; ; ENMT: Negative for ear pain, hoarseness, nasal congestion, sinus pressure and sore throat. ; ; Cardiovascular: Negative for chest pain, palpitations, diaphoresis, dyspnea and peripheral edema. ; ; Respiratory: Negative for cough, wheezing and stridor. ; ; Gastrointestinal: Negative for nausea, vomiting, diarrhea, abdominal pain, blood in stool, hematemesis, jaundice and rectal bleeding. . ; ; Genitourinary: Negative for dysuria, flank pain and hematuria. ; ; Musculoskeletal: +LBP. Negative for neck pain. Negative for swelling and trauma.; ; Skin: Negative  for pruritus, rash, abrasions, blisters, bruising and skin lesion.; ; Neuro: Negative for headache, lightheadedness and neck stiffness. Negative for weakness, altered level of consciousness, altered mental status, extremity weakness, paresthesias, involuntary movement, seizure and syncope.       Physical Exam Updated Vital Signs BP 135/73 (BP Location: Right Arm)   Pulse 72   Temp 98 F (36.7 C) (Oral)   Resp 16   Ht 5\' 6"  (1.676 m)   Wt 59 kg (130 lb)   SpO2 99%   BMI 20.98 kg/m   Physical Exam 0910: Physical examination:  Nursing notes reviewed; Vital signs and O2 SAT reviewed;  Constitutional: Well developed, Well nourished, Well hydrated, In no acute distress; Head:  Normocephalic, atraumatic; Eyes: EOMI, PERRL, No scleral icterus; ENMT: Mouth and pharynx normal, Mucous membranes moist; Neck: Supple, Full range of motion, No lymphadenopathy; Cardiovascular: Regular rate and rhythm, No gallop; Respiratory: Breath sounds clear & equal bilaterally, No wheezes.  Speaking full sentences with ease, Normal respiratory effort/excursion; Chest: Nontender, Movement normal; Abdomen: Soft, Nontender, Nondistended, Normal bowel sounds; Genitourinary: No CVA tenderness; Spine:  No midline CS, TS, LS tenderness. +TTP right lower lumbar paraspinal muscles.;; Extremities: Pulses normal, No tenderness, No edema, No calf edema or asymmetry.; Neuro: AA&Ox3, Major CN grossly intact.  Speech clear. No gross focal motor or sensory deficits in extremities. Strength 5/5 equal bilat UE's and LE's, including great toe dorsiflexion.  DTR 2/4 equal bilat UE's and LE's.  No gross sensory deficits.  Neg straight leg raises bilat.  Climbs on and off chair in exam room easily by herself. Gait steady..; Skin: Color normal, Warm, Dry.   ED Treatments / Results  Labs (all labs ordered are listed, but only abnormal results are displayed)   EKG  EKG Interpretation None       Radiology   Procedures Procedures  (including critical care time)  Medications Ordered in ED Medications - No data to display   Initial Impression / Assessment and Plan / ED Course  I have reviewed the triage vital signs and the nursing notes.  Pertinent labs & imaging results that were available during my care of the patient were reviewed by me and considered in my medical decision making (see chart for details).  MDM Reviewed: previous chart, nursing note and vitals Interpretation: x-ray   Dg Lumbar Spine Complete Result Date: 08/18/2017 CLINICAL DATA:  Pain after sudden motion EXAM: LUMBAR SPINE - COMPLETE 4+ VIEW COMPARISON:  January 08, 2013 FINDINGS: Frontal, lateral, spot lumbosacral lateral, and bilateral oblique views were obtained. There are 5 non-rib-bearing lumbar type vertebral bodies. No fracture or spondylolisthesis. There is marked disc space narrowing at L5-S1. Other disc spaces appear unremarkable. There is facet osteoarthritic change at L5-S1 bilaterally. IMPRESSION: Osteoarthritic change at L5-S1.  No fracture or  spondylolisthesis. Electronically Signed   By: Lowella Grip III M.D.   On: 08/18/2017 10:00    1020: Pt states she came back to the ED today because she did not have an xray of her lower back on her previous visit and is requesting one today. XR as above. Pt also states she "was constipated" but took miralax and had a resultant BM. Abd benign.  Pt denies any other complaints and states she is ready to go home now. Tx symptomatically. Dx and testing d/w pt.  Questions answered.  Verb understanding, agreeable to d/c home with outpt f/u.    Final Clinical Impressions(s) / ED Diagnoses   Final diagnoses:  None    ED Discharge Orders    None       Francine Graven, DO 08/20/17 1251

## 2018-02-27 ENCOUNTER — Emergency Department (HOSPITAL_COMMUNITY)
Admission: EM | Admit: 2018-02-27 | Discharge: 2018-02-27 | Disposition: A | Discharge status: Home / Self Care | Payer: BLUE CROSS/BLUE SHIELD | Attending: Emergency Medicine | Admitting: Emergency Medicine

## 2018-02-27 ENCOUNTER — Emergency Department (HOSPITAL_COMMUNITY): Payer: BLUE CROSS/BLUE SHIELD

## 2018-02-27 ENCOUNTER — Encounter (HOSPITAL_COMMUNITY): Payer: Self-pay | Admitting: Emergency Medicine

## 2018-02-27 DIAGNOSIS — Y9389 Activity, other specified: Secondary | ICD-10-CM | POA: Diagnosis not present

## 2018-02-27 DIAGNOSIS — Z79899 Other long term (current) drug therapy: Secondary | ICD-10-CM | POA: Diagnosis not present

## 2018-02-27 DIAGNOSIS — Y92219 Unspecified school as the place of occurrence of the external cause: Secondary | ICD-10-CM | POA: Diagnosis not present

## 2018-02-27 DIAGNOSIS — S46911A Strain of unspecified muscle, fascia and tendon at shoulder and upper arm level, right arm, initial encounter: Secondary | ICD-10-CM | POA: Insufficient documentation

## 2018-02-27 DIAGNOSIS — S4991XA Unspecified injury of right shoulder and upper arm, initial encounter: Secondary | ICD-10-CM | POA: Diagnosis present

## 2018-02-27 DIAGNOSIS — X500XXA Overexertion from strenuous movement or load, initial encounter: Secondary | ICD-10-CM | POA: Insufficient documentation

## 2018-02-27 DIAGNOSIS — Y998 Other external cause status: Secondary | ICD-10-CM | POA: Insufficient documentation

## 2018-02-27 IMAGING — DX DG SHOULDER 2+V*R*
3 series · 3 of 3 positions shown · non-contrast
Comparison: 12/09/2003 right shoulder radiograph

CLINICAL DATA: Right shoulder pain.  No reported injury

EXAM:
RIGHT SHOULDER - 2+ VIEW

[shoulder grashey]
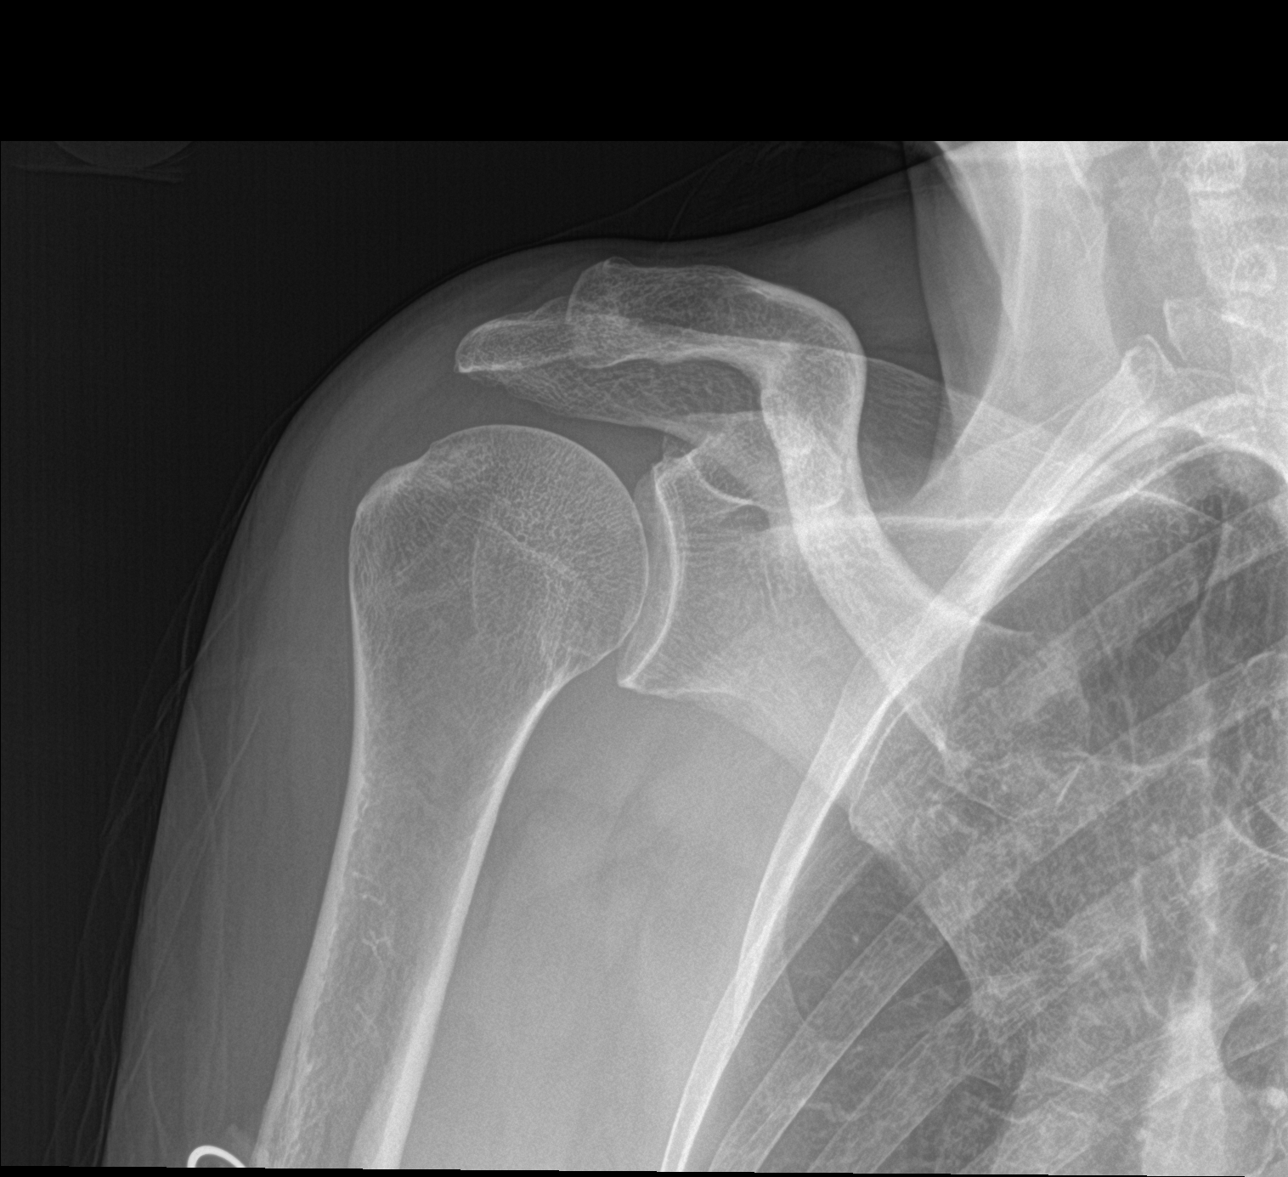

[shoulder axillary]
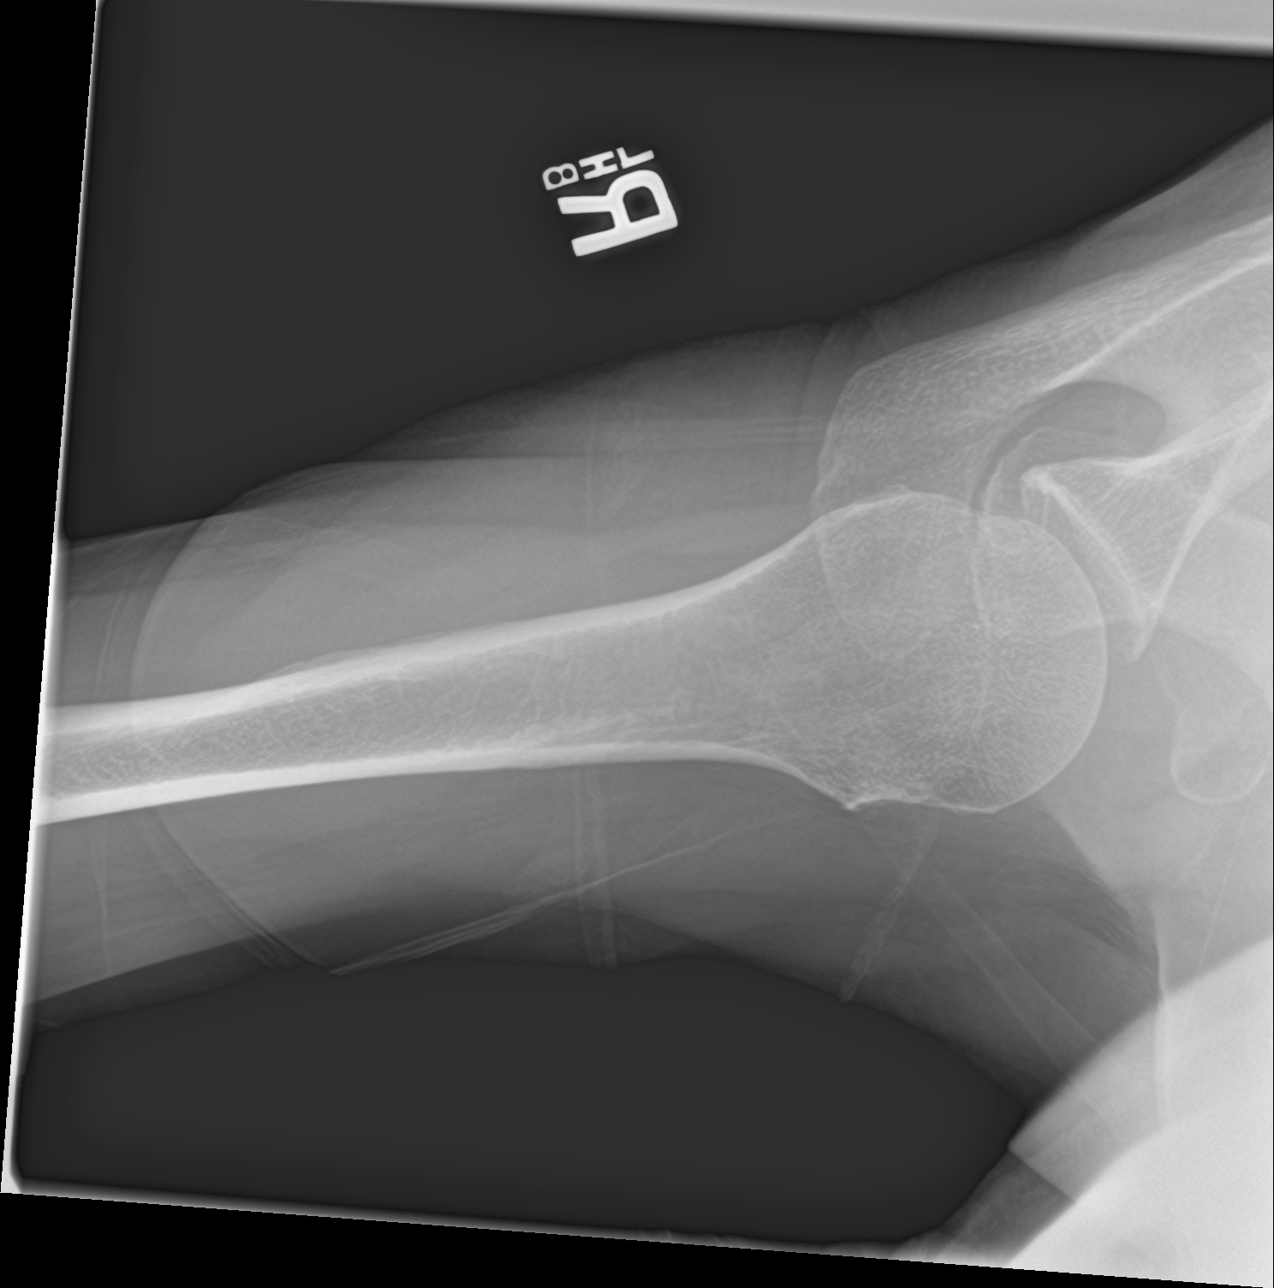

[shoulder y view]
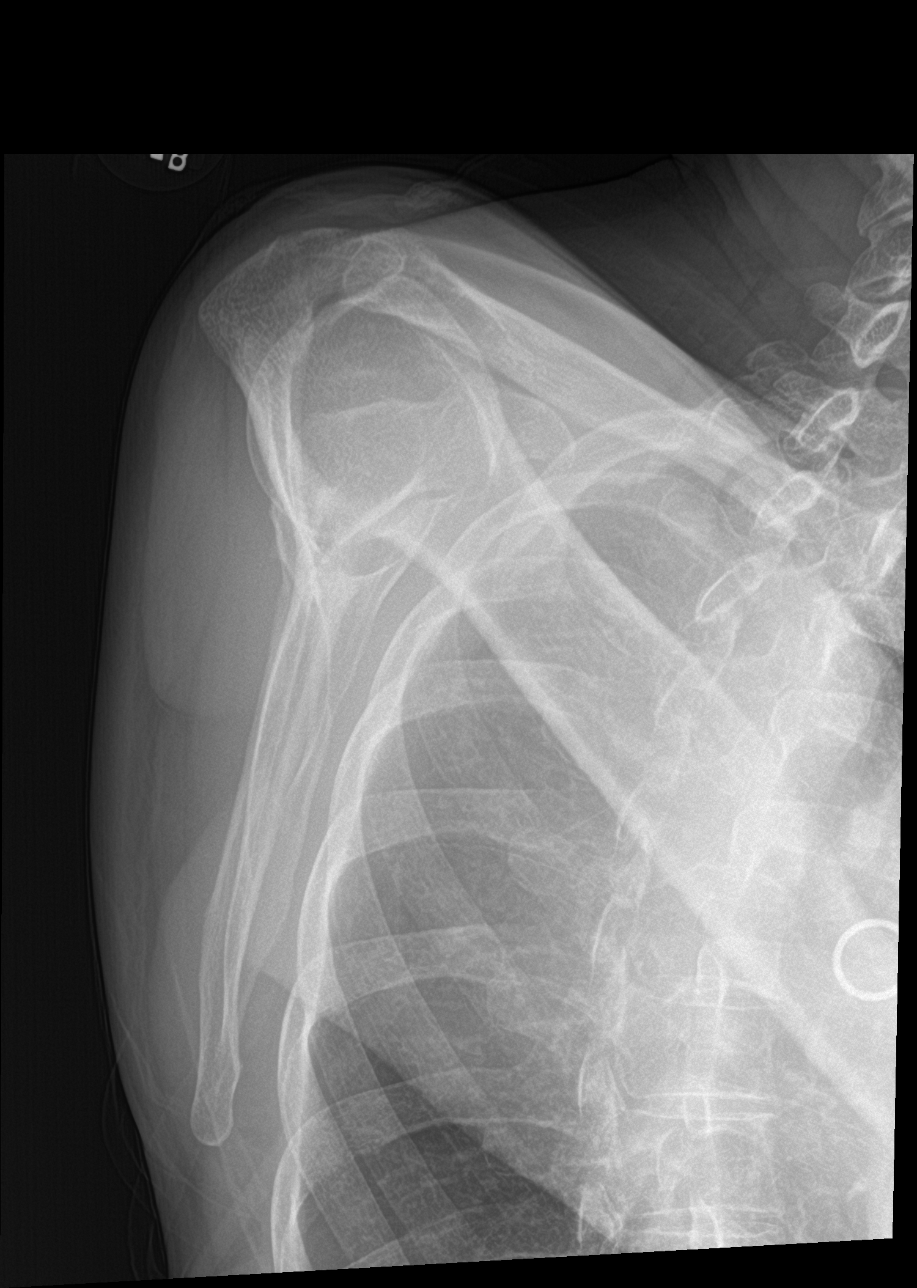

[3 of 3 positions shown; findings below may reference images not displayed]

FINDINGS: There is no evidence of fracture or dislocation. There is no
evidence of arthropathy or other focal bone abnormality. Soft
tissues are unremarkable.
IMPRESSION: Negative.

## 2018-02-27 MED ORDER — MELOXICAM 7.5 MG PO TABS: 7.5000 mg | ORAL_TABLET | Freq: Every day | ORAL | 0 refills | Status: DC

## 2018-02-27 MED ORDER — METHOCARBAMOL 500 MG PO TABS: 500.0000 mg | ORAL_TABLET | Freq: Two times a day (BID) | ORAL | 0 refills | Status: DC

## 2018-02-27 NOTE — ED Triage Notes (Signed)
Pt reports right shoulder pain x 1 month.  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  Pain increases when raising or lying on that.

## 2018-02-27 NOTE — Discharge Instructions (Signed)
Schedule to see Dr. Aline Brochure if pain persist

## 2018-02-28 NOTE — ED Provider Notes (Signed)
[facility] EMERGENCY DEPARTMENT Provider Note   CSN: [id number] Arrival date & time: 02/27/18  1275     History   Chief Complaint Chief Complaint  Patient presents with  . Shoulder Pain    HPI Laurie Wallace is a 72 y.o. female.  The history is provided by the patient. No language interpreter was used.  Shoulder Pain   This is a new problem. The current episode started more than 1 week ago. The problem occurs constantly. The problem has been gradually worsening. The pain is present in the right shoulder. The quality of the pain is described as aching. She has tried nothing for the symptoms. The treatment provided no relief. Family history is significant for no rheumatoid arthritis and no gout.  Pt works for [employer].  She has been moving furniture to clean.  Pt reports she has begun having pain in her right shoulder   Past Medical History:  Diagnosis Date  . Allergy   . Arthritis     Patient Active Problem List   Diagnosis Date Noted  . Displacement of lumbar intervertebral disc without myelopathy 04/07/2010  . LUMBAR SPRAIN AND STRAIN 12/04/2009    Past Surgical History:  Procedure Laterality Date  . ABDOMINAL HYSTERECTOMY    . TOE SURGERY Right    1st toe     OB History    Gravida  2   Para  2   Term  2   Preterm      AB      Living  1     SAB      TAB      Ectopic      Multiple      Live Births               Home Medications    Prior to Admission medications   Medication Sig Start Date End Date Taking? Authorizing Provider  amoxicillin (AMOXIL) 875 MG tablet Take 1 tablet (875 mg total) by mouth 2 (two) times daily. 02/15/15   Norval Gable, MD  benzonatate (TESSALON) 200 MG capsule Take 1 capsule (200 mg total) by mouth 3 (three) times daily as needed for cough. 02/15/15   Norval Gable, MD  brompheniramine (VAZOL) 2 MG/5ML LIQD Take 2 mg by mouth 2 (two) times daily.    [provider]  cephALEXin (KEFLEX) 500 MG  capsule Take 500 mg by mouth 3 (three) times daily.    Trula Slade, DPM  fluticasone (FLONASE) 50 MCG/ACT nasal spray Place 2 sprays into the nose daily.      [provider]  meloxicam (MOBIC) 7.5 MG tablet Take 1 tablet (7.5 mg total) by mouth daily. 02/27/18   Fransico Meadow, PA-C  methocarbamol (ROBAXIN) 500 MG tablet Take 1 tablet (500 mg total) by mouth 2 (two) times daily. 02/27/18   Fransico Meadow, PA-C  naproxen (NAPROSYN) 500 MG tablet Take 1 tablet (500 mg total) by mouth 2 (two) times daily with a meal. 08/15/17   Triplett, Tammy, PA-C  oxyCODONE-acetaminophen (PERCOCET/ROXICET) 5-325 MG per tablet Take 1 tablet by mouth every 4 (four) hours as needed for severe pain.    Trula Slade, DPM  promethazine (PHENERGAN) 12.5 MG tablet Take 12.5 mg by mouth every 6 (six) hours as needed for nausea or vomiting.    Trula Slade, DPM  Pseudoephedrine-Acetaminophen (EQ SINUS MAXIMUM STRENGTH PO) Take 1 tablet by mouth daily as needed. For sinus congestion  [provider]    Family History History reviewed. No pertinent family history.  Social History Social History   Tobacco Use  . Smoking status: Never Smoker  . Smokeless tobacco: Never Used  Substance Use Topics  . Alcohol use: No  . Drug use: No     Allergies   Patient has no known allergies.   Review of Systems Review of Systems  All other systems reviewed and are negative.    Physical Exam Updated Vital Signs BP 127/79 (BP Location: Left Arm)   Pulse 65   Temp 97.8 F (36.6 C) (Oral)   Resp 18   Ht 5\' 6"  (1.676 m)   Wt 56.7 kg   SpO2 98%   BMI 20.18 kg/m   Physical Exam  Constitutional: She is oriented to person, place, and time. She appears well-developed and well-nourished.  HENT:  Head: Normocephalic.  Eyes: EOM are normal.  Neck: Normal range of motion.  Pulmonary/Chest: Effort normal.  Abdominal: She exhibits no distension.  Musculoskeletal: Normal range of  motion.  Pain right shoulder with movement, good strength, nv and ns intact  Neurological: She is alert and oriented to person, place, and time.  Psychiatric: She has a normal mood and affect.  Nursing note and vitals reviewed.    ED Treatments / Results  Labs (all labs ordered are listed, but only abnormal results are displayed) Labs Reviewed - No data to display  EKG None  Radiology Dg Shoulder Right  Result Date: 02/27/2018 CLINICAL DATA:  Right shoulder pain.  No reported injury EXAM: RIGHT SHOULDER - 2+ VIEW COMPARISON:  12/09/2003 right shoulder radiograph FINDINGS: There is no evidence of fracture or dislocation. There is no evidence of arthropathy or other focal bone abnormality. Soft tissues are unremarkable. IMPRESSION: Negative. Electronically Signed   By: Ilona Sorrel M.D.   On: 02/27/2018 08:32    Procedures Procedures (including critical care time)  Medications Ordered in ED Medications - No data to display   Initial Impression / Assessment and Plan / ED Course  I have reviewed the triage vital signs and the nursing notes.  Pertinent labs & imaging results that were available during my care of the patient were reviewed by me and considered in my medical decision making (see chart for details).     Patient X-Ray negative for obvious fracture or dislocation.  Pt advised to follow up with orthopedics.  conservative therapy recommended and discussed. Patient will be discharged home & is agreeable with above plan. Returns precautions discussed. Pt appears safe for discharge.  Final Clinical Impressions(s) / ED Diagnoses   Final diagnoses:  Strain of right shoulder, initial encounter    ED Discharge Orders         Ordered    meloxicam (MOBIC) 7.5 MG tablet  Daily     02/27/18 0900    methocarbamol (ROBAXIN) 500 MG tablet  2 times daily     02/27/18 0900        An After Visit Summary was printed and given to the patient.   Fransico Meadow, [place] 02/28/18  3151    Nat Christen, MD 02/28/18 [phone]

## 2018-03-08 ENCOUNTER — Ambulatory Visit: Payer: Medicare Other | Admitting: Orthopaedic Surgery

## 2018-03-15 ENCOUNTER — Ambulatory Visit: Payer: Medicare Other | Admitting: Orthopaedic Surgery

## 2018-03-15 ENCOUNTER — Encounter: Payer: Self-pay | Admitting: Orthopaedic Surgery

## 2018-03-16 ENCOUNTER — Ambulatory Visit (INDEPENDENT_AMBULATORY_CARE_PROVIDER_SITE_OTHER): Payer: BLUE CROSS/BLUE SHIELD | Admitting: Orthopaedic Surgery

## 2018-03-16 ENCOUNTER — Encounter: Payer: Self-pay | Admitting: Orthopaedic Surgery

## 2018-03-16 VITALS — BP 117/71 | HR 73 | Ht 64.0 in | Wt 128.0 lb

## 2018-03-16 DIAGNOSIS — M25511 Pain in right shoulder: Secondary | ICD-10-CM

## 2018-03-16 NOTE — Progress Notes (Signed)
Subjective:    Patient ID: Laurie Wallace, female    DOB: Jul 16, 1945, 72 y.o.   MRN: [id number]  HPI She has right shoulder pain and has had it for over six to eight weeks. She does a lot of lifting third shift at work in the [employer] area.  She has no swelling or numbness.  She has no trauma, no weakness.  Her shoulder has gotten more painful.  She has pain in trying to lift it overhead. She has been to the ER.  She had x-rays which were negative done 02-27-18.  I have reviewed the X-rays and the notes from the ER.  She has been on Naprosyn which helps some.  She declines an injection.   I will have her begin PT and keep her out of work.   Review of Systems  Constitutional: Positive for activity change.  Endocrine: Positive for cold intolerance.  Musculoskeletal: Positive for arthralgias.  Allergic/Immunologic: Positive for environmental allergies.  All other systems reviewed and are negative.  For Review of Systems, all other systems reviewed and are negative.  The following is a summary of the past history medically, past history surgically, known current medicines, social history and family history.  This information is gathered electronically by the computer from prior information and documentation.  I review this each visit and have found including this information at this point in the chart is beneficial and informative.   Past Medical History:  Diagnosis Date  . Allergy   . Arthritis   . COPD (chronic obstructive pulmonary disease) ([place])     Past Surgical History:  Procedure Laterality Date  . ABDOMINAL HYSTERECTOMY    . TOE SURGERY Right    1st toe    Current Outpatient Medications on File Prior to Visit  Medication Sig Dispense Refill  . amoxicillin (AMOXIL) 875 MG tablet Take 1 tablet (875 mg total) by mouth 2 (two) times daily. 20 tablet 0  . benzonatate (TESSALON) 200 MG capsule Take 1 capsule (200 mg total) by mouth 3 (three) times daily as needed for  cough. 30 capsule 0  . brompheniramine (VAZOL) 2 MG/5ML LIQD Take 2 mg by mouth 2 (two) times daily.    . cephALEXin (KEFLEX) 500 MG capsule Take 500 mg by mouth 3 (three) times daily.    . fluticasone (FLONASE) 50 MCG/ACT nasal spray Place 2 sprays into the nose daily.      . meloxicam (MOBIC) 7.5 MG tablet Take 1 tablet (7.5 mg total) by mouth daily. 20 tablet 0  . methocarbamol (ROBAXIN) 500 MG tablet Take 1 tablet (500 mg total) by mouth 2 (two) times daily. 20 tablet 0  . naproxen (NAPROSYN) 500 MG tablet Take 1 tablet (500 mg total) by mouth 2 (two) times daily with a meal. 20 tablet 0  . oxyCODONE-acetaminophen (PERCOCET/ROXICET) 5-325 MG per tablet Take 1 tablet by mouth every 4 (four) hours as needed for severe pain.    . promethazine (PHENERGAN) 12.5 MG tablet Take 12.5 mg by mouth every 6 (six) hours as needed for nausea or vomiting.    . Pseudoephedrine-Acetaminophen (EQ SINUS MAXIMUM STRENGTH PO) Take 1 tablet by mouth daily as needed. For sinus congestion      No current facility-administered medications on file prior to visit.     Social History   Socioeconomic History  . Marital status: Single    Spouse name: Not on file  . Number of children: Not on file  . Years of education:  Not on file  . Highest education level: Not on file  Occupational History  . Not on file  Social Needs  . Financial resource strain: Not on file  . Food insecurity:    Worry: Not on file    Inability: Not on file  . Transportation needs:    Medical: Not on file    Non-medical: Not on file  Tobacco Use  . Smoking status: Never Smoker  . Smokeless tobacco: Never Used  Substance and Sexual Activity  . Alcohol use: No  . Drug use: No  . Sexual activity: Not on file  Lifestyle  . Physical activity:    Days per week: Not on file    Minutes per session: Not on file  . Stress: Not on file  Relationships  . Social connections:    Talks on phone: Not on file    Gets together: Not on file     Attends religious service: Not on file    Active member of club or organization: Not on file    Attends meetings of clubs or organizations: Not on file    Relationship status: Not on file  . Intimate partner violence:    Fear of current or ex partner: Not on file    Emotionally abused: Not on file    Physically abused: Not on file    Forced sexual activity: Not on file  Other Topics Concern  . Not on file  Social History Narrative  . Not on file    Family History  Problem Relation Age of Onset  . Cancer Mother     BP 117/71   Pulse 73   Ht 5\' 4"  (1.626 m)   Wt 128 lb (58.1 kg)   BMI 21.97 kg/m   Body mass index is 21.97 kg/m.     Objective:   Physical Exam  Constitutional: She is oriented to person, place, and time. She appears well-developed and well-nourished.  HENT:  Head: Normocephalic and atraumatic.  Eyes: Pupils are equal, round, and reactive to light. Conjunctivae and EOM are normal.  Neck: Normal range of motion. Neck supple.  Cardiovascular: Normal rate, regular rhythm and intact distal pulses.  Pulmonary/Chest: Effort normal.  Abdominal: Soft.  Musculoskeletal:       Right shoulder: She exhibits decreased range of motion, tenderness and pain.       Arms: Neurological: She is alert and oriented to person, place, and time. She has normal reflexes. She displays normal reflexes. No cranial nerve deficit. She exhibits normal muscle tone. Coordination normal.  Skin: Skin is warm and dry.  Psychiatric: She has a normal mood and affect. Her behavior is normal. Judgment and thought content normal.          Assessment & Plan:   Encounter Diagnosis  Name Primary?  . Pain in joint of right shoulder Yes   Begin PT.  Continue the Naprosyn  Use Aspercreme or Bio Freeze to the shoulder.  Return in two weeks.  Call if any problem.  Precautions discussed.   Electronically Signed Sanjuana Kava, MD 9/11/20193:22 PM

## 2018-03-28 ENCOUNTER — Encounter: Payer: Self-pay | Admitting: Physical Therapy

## 2018-03-28 ENCOUNTER — Ambulatory Visit: Payer: BLUE CROSS/BLUE SHIELD | Attending: Orthopaedic Surgery | Admitting: Physical Therapy

## 2018-03-28 DIAGNOSIS — M25511 Pain in right shoulder: Secondary | ICD-10-CM | POA: Insufficient documentation

## 2018-03-28 DIAGNOSIS — G8929 Other chronic pain: Secondary | ICD-10-CM | POA: Insufficient documentation

## 2018-03-28 NOTE — Therapy (Signed)
[place] [facility] [address] [place], [address] Phone: [phone]   Fax:  [phone]  Physical Therapy Evaluation  Patient Details  Name: TANNAH DREYFUSS MRN: [id number] Date of Birth: 04-15-1946 Referring Provider: Sanjuana Kava, MD   Encounter Date: 03/28/2018  PT End of Session - 03/28/18 1317    Visit Number  1    Number of Visits  17    Date for PT Re-Evaluation  05/23/18    PT Start Time  0100    PT Stop Time  0155    PT Time Calculation (min)  55 min    Activity Tolerance  Patient tolerated treatment well    Behavior During Therapy  [facility] for tasks assessed/performed       Past Medical History:  Diagnosis Date  . Allergy   . Arthritis   . COPD (chronic obstructive pulmonary disease) ([place])     Past Surgical History:  Procedure Laterality Date  . ABDOMINAL HYSTERECTOMY    . TOE SURGERY Right    1st toe    There were no vitals filed for this visit.   Subjective Assessment - 03/28/18 1305    Subjective  R shoulder pain    Pertinent History  Patient is a 72 year old female that reports R shoulder pain that has been worsening since July following carrying a lot of furniture for her job at [employer]. Patient reports most pain with bringing her arm back down from an overhead position. Patient reports worst pain over the past week: 9/10 pain; and best 0/10 pain. Patient is currently unable to complete her work duties in [employer] and AD[employer]s without pain. Pt denies N/V,B&B,  unexplained weight fluctuation, saddle paresthesia, fever, night sweats, or unrelenting night pain at this time.    Limitations  Lifting;House hold activities    How long can you sit comfortably?  unlimited    How long can you stand comfortably?  unlimited    How long can you walk comfortably?  unlimited     Diagnostic tests  Xray; unremarkable    Patient Stated Goals  Complete work duties (lifting trashcans, cleaning  overhead)    Currently in Pain?  Yes    Pain Score  6     Pain Location  Shoulder    Pain Orientation  Right    Pain Descriptors / Indicators  Aching;Dull    Pain Type  Acute pain    Pain Radiating Towards  Patient reports occasional R neck pain     Pain Onset  More than a month ago    Pain Frequency  Constant    Aggravating Factors   overhead motion    Pain Relieving Factors  Patient reports she has tried medication and heat with no help    Effect of Pain on Daily Activities  Unable to complete job duties      AROM Shoulder flex L wnl R 111d with pain  Shoulder ext wnl Shoulder abd L wnl R 92d with pain Shoulder IR L R scapular spine R L5 Shoulder ER L T10 R posterior skull All cervical motions wnl without pain   PROM Shoulder flex L wnl R 135d with pain  Shoulder ext wnl Shoulder abd 98d with pain Shoulder IR R 50d Shoulder ER  R 35d All cervical motions wnl without pain   Strength Shoulder flex L 4+/5 R 4/5 Shoulder ext L 4+/5 R 4/5 Shoulder abd L 5/5 R 3+/5 with  pain  Shoulder IR 4+/5 bilat Shoulder ER L 4+/5 R 4/5 Elbow flex: 5/5 bilat Elbow ext 5/5 bilat  Special Tests/Other (+) Michel Bickers (+) Neers (+) Painful Arc (+) Empty can  (-) ER lag sign (+) Lift off test () Apprehension () Relocation (-) Speeds (+) O'Briens; but patient reports test position causes "pulling" in post shoulder Patient with increased fear avoidance with all tests and measures limiting accuracy of special tests  Posture Forward head rounded shoulder posture  Ther-Ex - Pulleys flexion x10 with 2-3sec holds in max AAROM - Pulleys abduction x10 with 2-3sec holds in max AAROM  - Visual demo of passive ER and IR in supine with verbal understanding of therex - Education on ice for pain modulation 10-15min 2-3x/day - Education on decreasing hyper-sensitivity to restore ROM in R shoulder         [facility] PT Assessment - 03/28/18 0001      Assessment   Medical Diagnosis  R  shoulder strain    Referring Provider  Sanjuana Kava, MD    Onset Date/Surgical Date  01/25/18    Hand Dominance  Right    Next MD Visit  03/31/18    Prior Therapy  yes      Balance Screen   Has the patient fallen in the past 6 months  No    Has the patient had a decrease in activity level because of a fear of falling?   No    Is the patient reluctant to leave their home because of a fear of falling?   No      Home Environment   Living Environment  Private residence    Living Arrangements  Children    Available Help at Discharge  Family    Type of [place] to enter    Entrance Stairs-Number of Steps  3    Entrance Stairs-Rails  Can reach both    [place]  One level      Prior Function   Level of [place]  Full time employment    Vocation Requirements  lifting, walking, cleaning, pushing/pulling      Sensation   Light Touch  Appears Intact                    Objective measurements completed on examination: See above findings.              PT Education - 03/28/18 1314    Education Details  Patient was educated on diagnosis, anatomy and pathology involved, prognosis, role of PT, and was given an HEP, demonstrating exercise with proper form following verbal and tactile cues, and was given a paper hand out to continue exercise at home. Pt was educated on and agreed to plan of care.    Person(s) Educated  Patient    Methods  Explanation;Demonstration;Tactile cues;Verbal cues;Handout    Comprehension  Verbal cues required;Returned demonstration;Verbalized understanding;Tactile cues required       PT Short Term Goals - 03/28/18 1400      PT SHORT TERM GOAL #1   Title  Pt will be independent with HEP in order to improve strength and ROM in order to decrease fall risk and improve function at home and work.    Time  4    Period  Weeks    Status  New        PT Long Term Goals - 03/28/18  [place] #1   Title  Pt will demonstrate symmetrical shoulder strenght to return to PLOF to complete job duties without pain    Baseline  03/28/18 see eval    Time  8    Period  Weeks    Status  New      PT LONG TERM GOAL #2   Title  Patient will demonstrate symmetrical AROM wnl to complete job duties and ADLs    Baseline  03/28/18 see eval    Time  8    Period  Weeks    Status  New      PT LONG TERM GOAL #3   Title  Pt will decrease worst pain as reported on NPRS by at least 3 points in order to demonstrate clinically significant reduction in pain.    Baseline  03/28/18: worst pain 9/10    Time  8    Period  Weeks    Status  New      PT LONG TERM GOAL #4   Title  Patient will increase FOTO score to 67 to demonstrate predicted increase in functional mobility to complete ADLs    Baseline   54    Time  8    Period  Weeks    Status  New             Plan - 03/28/18 1414    Clinical Impression Statement  Pt is a 72 year old female with R shoulder pain worsening since July 2019. Current activity limitations in reaching, lifting, self-care ADLs e.g. dressing, bathing, grooming. 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 Pt will benefit form skilled PT intervention to address the aforementioned impairments and activity limitations for best return to PLOF.    Clinical Presentation  Evolving    Clinical Presentation due to:  2 personal factors/comorbidities, 3 body systems/activity limitations/participation restrictions     Clinical Decision Making  Moderate    Rehab Potential  Good    Clinical Impairments Affecting Rehab Potential  (+) strong social support, motivation (-) age, sedentary lifestyle, other comborbidities, fear avoidance    PT Frequency  2x / week    PT  Duration  8 weeks    PT Treatment/Interventions  ADLs/Self Care Home Management;Electrical Stimulation;Therapeutic activities;Passive range of motion;Manual techniques;Patient/family education;Therapeutic exercise;Iontophoresis 4mg /ml Dexamethasone;Aquatic Therapy;Moist Heat;Traction;Ultrasound;Cryotherapy;Functional mobility training;Neuromuscular re-education;Balance training;Taping;Dry needling;Energy conservation    PT Next Visit Plan  HEP and goal review; pain modulation    PT Home Exercise Plan  pulleys flex/abd, supine passive IR and ER    Consulted and Agree with Plan of Care  Patient       Patient will benefit from skilled therapeutic intervention in order to improve the following deficits and impairments:  Decreased activity tolerance, Decreased strength, Increased fascial restricitons, Impaired flexibility, Impaired UE functional use, Postural dysfunction, Pain, Improper body mechanics, Decreased range of motion, Decreased endurance, Decreased mobility, Hypomobility, Impaired sensation  Visit Diagnosis: Chronic right shoulder pain     Problem List Patient Active Problem List   Diagnosis Date Noted  . Displacement of lumbar intervertebral disc without myelopathy 04/07/2010  . LUMBAR SPRAIN AND STRAIN 12/04/2009   Shelton Silvas PT, DPT Shelton Silvas 03/28/2018, 2:23 PM  [place] [facility] [address], [place], [address]  Phone: [phone]   Fax:  [phone]  Name: NYNA CHILTON MRN: [id number] Date of Birth: 1946/01/01

## 2018-03-31 ENCOUNTER — Encounter: Payer: Self-pay | Admitting: Orthopaedic Surgery

## 2018-03-31 ENCOUNTER — Ambulatory Visit: Payer: BLUE CROSS/BLUE SHIELD | Admitting: Orthopaedic Surgery

## 2018-03-31 ENCOUNTER — Ambulatory Visit: Payer: BLUE CROSS/BLUE SHIELD | Admitting: Physical Therapy

## 2018-03-31 VITALS — BP 100/65 | HR 65 | Ht 64.0 in | Wt 128.0 lb

## 2018-03-31 DIAGNOSIS — M25511 Pain in right shoulder: Secondary | ICD-10-CM | POA: Diagnosis not present

## 2018-03-31 MED ORDER — HYDROCODONE-ACETAMINOPHEN 5-325 MG PO TABS: ORAL_TABLET | ORAL | 0 refills | Status: DC

## 2018-03-31 NOTE — Progress Notes (Signed)
Patient JY:NWGNFAO Laurie Wallace, female DOB:[birth year], 72 y.o. ZHY:[id number]  Chief Complaint  Patient presents with  . Shoulder Pain    right     HPI  Laurie Wallace is a 72 y.o. female who has right shoulder pain.  She has been to only one PT appointment.  She has some less pain.  She says the ibuprofen is not strong enough and I will give some hydrocodone.  She is doing her exercises.  I have read the one PT report. She has no numbness, no new trauma.   Body mass index is 21.97 kg/m.  ROS  Review of Systems  Constitutional: Positive for activity change.  Endocrine: Positive for cold intolerance.  Musculoskeletal: Positive for arthralgias.  Allergic/Immunologic: Positive for environmental allergies.  All other systems reviewed and are negative.   All other systems reviewed and are negative.  The following is a summary of the past history medically, past history surgically, known current medicines, social history and family history.  This information is gathered electronically by the computer from prior information and documentation.  I review this each visit and have found including this information at this point in the chart is beneficial and informative.    Past Medical History:  Diagnosis Date  . Allergy   . Arthritis   . COPD (chronic obstructive pulmonary disease) ([place])     Past Surgical History:  Procedure Laterality Date  . ABDOMINAL HYSTERECTOMY    . TOE SURGERY Right    1st toe    Family History  Problem Relation Age of Onset  . Cancer Mother     Social History Social History   Tobacco Use  . Smoking status: Never Smoker  . Smokeless tobacco: Never Used  Substance Use Topics  . Alcohol use: No  . Drug use: No    No Known Allergies  Current Outpatient Medications  Medication Sig Dispense Refill  . benzonatate (TESSALON) 200 MG capsule Take 1 capsule (200 mg total) by mouth 3 (three) times daily as needed for cough. 30 capsule 0  .  fluticasone (FLONASE) 50 MCG/ACT nasal spray Place 2 sprays into the nose daily.      Marland Kitchen levocetirizine (XYZAL) 5 MG tablet TAKE 1 TABLET BY MOUTH AT BEDTIME AS NEEDED FOR ALLERGIES  5  . brompheniramine (VAZOL) 2 MG/5ML LIQD Take 2 mg by mouth 2 (two) times daily.    Marland Kitchen HYDROcodone-acetaminophen (NORCO/VICODIN) 5-325 MG tablet One tablet every four hours as needed for acute pain.  Limit of five days per  statue. 30 tablet 0  . meloxicam (MOBIC) 7.5 MG tablet Take 1 tablet (7.5 mg total) by mouth daily. (Patient not taking: Reported on 03/31/2018) 20 tablet 0  . methocarbamol (ROBAXIN) 500 MG tablet Take 1 tablet (500 mg total) by mouth 2 (two) times daily. (Patient not taking: Reported on 03/31/2018) 20 tablet 0  . Pseudoephedrine-Acetaminophen (EQ SINUS MAXIMUM STRENGTH PO) Take 1 tablet by mouth daily as needed. For sinus congestion      No current facility-administered medications for this visit.      Physical Exam  Blood pressure 100/65, pulse 65, height 5\' 4"  (1.626 m), weight 128 lb (58.1 kg).  Constitutional: overall normal hygiene, normal nutrition, well developed, normal grooming, normal body habitus. Assistive device:none  Musculoskeletal: gait and station Limp none, muscle tone and strength are normal, no tremors or atrophy is present.  .  Neurological: coordination overall normal.  Deep tendon reflex/nerve stretch intact.  Sensation normal.  Cranial nerves  II-XII intact.   Skin:   Normal overall no scars, lesions, ulcers or rashes. No psoriasis.  Psychiatric: Alert and oriented x 3.  Recent memory intact, remote memory unclear.  Normal mood and affect. Well groomed.  Good eye contact.  Cardiovascular: overall no swelling, no varicosities, no edema bilaterally, normal temperatures of the legs and arms, no clubbing, cyanosis and good capillary refill.  Lymphatic: palpation is normal.  Examination of right Upper Extremity is done.  Inspection:   Overall:  Elbow  non-tender without crepitus or defects, forearm non-tender without crepitus or defects, wrist non-tender without crepitus or defects, hand non-tender.    Shoulder: with glenohumeral joint tenderness, without effusion.   Upper arm: without swelling and tenderness   Range of motion:   Overall:  Full range of motion of the elbow, full range of motion of wrist and full range of motion in fingers.   Shoulder:  right  160 degrees forward flexion; 150 degrees abduction; 35 degrees internal rotation, 35 degrees external rotation, 15 degrees extension, 40 degrees adduction.   Stability:   Overall:  Shoulder, elbow and wrist stable   Strength and Tone:   Overall full shoulder muscles strength, full upper arm strength and normal upper arm bulk and tone.  All other systems reviewed and are negative   The patient has been educated about the nature of the problem(s) and counseled on treatment options.  The patient appeared to understand what I have discussed and is in agreement with it.  Encounter Diagnosis  Name Primary?  . Pain in joint of right shoulder Yes    PLAN Call if any problems.  Precautions discussed.  Continue current medications.   Return to clinic 3 weeks   I have reviewed the [place] web site prior to prescribing narcotic medicine for this patient.    Continue PT.  Electronically Signed Sanjuana Kava, MD 9/26/201910:00 AM

## 2018-04-01 ENCOUNTER — Ambulatory Visit: Payer: BLUE CROSS/BLUE SHIELD | Admitting: Physical Therapy

## 2018-04-01 ENCOUNTER — Encounter: Payer: Self-pay | Admitting: Physical Therapy

## 2018-04-01 DIAGNOSIS — G8929 Other chronic pain: Secondary | ICD-10-CM

## 2018-04-01 DIAGNOSIS — M25511 Pain in right shoulder: Secondary | ICD-10-CM | POA: Diagnosis not present

## 2018-04-01 NOTE — Therapy (Signed)
[facility] [address] [place], [address] Phone: [phone]   Fax:  [phone]  Physical Therapy Treatment  Patient Details  Name: Laurie Wallace MRN: [id number] Date of Birth: 11/12/1945 Referring Provider (PT): Sanjuana Kava, MD   Encounter Date: 04/01/2018  PT End of Session - 04/01/18 1005    Visit Number  2    Number of Visits  17    Date for PT Re-Evaluation  05/23/18    PT Start Time  0955    PT Stop Time  1035    PT Time Calculation (min)  40 min    Activity Tolerance  Patient tolerated treatment well    Behavior During Therapy  [facility] for tasks assessed/performed       Past Medical History:  Diagnosis Date  . Allergy   . Arthritis   . COPD (chronic obstructive pulmonary disease) ([place])     Past Surgical History:  Procedure Laterality Date  . ABDOMINAL HYSTERECTOMY    . TOE SURGERY Right    1st toe    There were no vitals filed for this visit.  Subjective Assessment - 04/01/18 0957    Subjective  Patient reports she saw her PCP yesterday and that he suggested she continue 3 more weeks off work, and gave her some "stronger pain medication" (hydrocodone). Patient reports 5/10 pain this am. Patient reports she has been icing shoulder at home which has been helpful for pain. Patient reports compliance with HEP with no questions or concerns.     Pertinent History  Patient is a 72 year old female that reports R shoulder pain that has been worsening since July following carrying a lot of furniture for her job at [employer]. Patient reports most pain with bringing her arm back down from an overhead position. Patient reports worst pain over the past week: 9/10 pain; and best 0/10 pain. Patient is currently unable to complete her work duties in [employer] and [employer] without pain. Pt denies N/V,B&B,  unexplained weight fluctuation, saddle paresthesia, fever, night sweats, or unrelenting night  pain at this time.    Limitations  Lifting;House hold activities    How long can you sit comfortably?  unlimited    How long can you stand comfortably?  unlimited    How long can you walk comfortably?  unlimited     Diagnostic tests  Xray; unremarkable    Patient Stated Goals  Complete work duties (lifting trashcans, cleaning overhead)    Pain Onset  More than a month ago         Ther-Ex - Pulleys flexion x15 with 2sec hold in max ROM w/ min pain - Pulleys abd x15 with 2sec hold in max ROM - UE ranger walking AAROM flexion x15 with 2sec holds in max flex; patient reports no increased pain - UE ranger clockwise and counter clockwise circles over full available ROM 2x 10 (each direction) with min cuing to prevent trunk motion compensation with good carry over following (short breaks between sets - Supine AAROM ER and IR with 1# wt assistance 2x 10 (each direction) with 3 sec hold in max ROM - Towel assisted IR 2x 10 2 sec holds with patient able to raise to L1 by end of sets - Towel assisted ER 2x 10 2 sec holds with patient able to raise to C7 by end of sets - Standing rows red tband 3x 10 demo and max cuing initially for proper form/posture  with good carry over following   Manual Patient with increased muscle guarding and pain with all passive ROM, held off for now, utilized AAROM instead                 PT Education - 04/01/18 1004    Education Details  Exercise form, postural education    Person(s) Educated  Patient    Methods  Explanation;Demonstration;Verbal cues    Comprehension  Verbalized understanding;Returned demonstration;Verbal cues required       PT Short Term Goals - 03/28/18 1400      PT SHORT TERM GOAL #1   Title  Pt will be independent with HEP in order to improve strength and ROM in order to decrease fall risk and improve function at home and work.    Time  4    Period  Weeks    Status  New        PT Long Term Goals - 03/28/18 1401      PT  LONG TERM GOAL #1   Title  Pt will demonstrate symmetrical shoulder strenght to return to PLOF to complete job duties without pain    Baseline  03/28/18 see eval    Time  8    Period  Weeks    Status  New      PT LONG TERM GOAL #2   Title  Patient will demonstrate symmetrical AROM wnl to complete job duties and ADLs    Baseline  03/28/18 see eval    Time  8    Period  Weeks    Status  New      PT LONG TERM GOAL #3   Title  Pt will decrease worst pain as reported on NPRS by at least 3 points in order to demonstrate clinically significant reduction in pain.    Baseline  03/28/18: worst pain 9/10    Time  8    Period  Weeks    Status  New      PT LONG TERM GOAL #4   Title  Patient will increase FOTO score to 67 to demonstrate predicted increase in functional mobility to complete ADLs    Baseline   54    Time  8    Period  Weeks    Status  New            Plan - 04/01/18 1008    Clinical Impression Statement  PT utilized AAROM therex to promote increased shoulder ROM as patient has increased muscle guarding and pain with PROM and AROM. Patient was able to complete all therex with minimal increased pain with "soreness" noted at the end of session. PT advised patient to continue icing, post session especially, for pain modulation. PT reviewed HEP with patient, which she was unable to complete without PT demo and cuing and educated patient on the importance of maintaining motion in the shoulder. PT educated patient on posture as well to prevent impingement. Patient verbalized understanding of all provided education.     Rehab Potential  Good    Clinical Impairments Affecting Rehab Potential  (+) strong social support, motivation (-) age, sedentary lifestyle, other comborbidities, fear avoidance    PT Frequency  2x / week    PT Duration  8 weeks    PT Treatment/Interventions  ADLs/Self Care Home Management;Electrical Stimulation;Therapeutic activities;Passive range of motion;Manual  techniques;Patient/family education;Therapeutic exercise;Iontophoresis 4mg /ml Dexamethasone;Aquatic Therapy;Moist Heat;Traction;Ultrasound;Cryotherapy;Functional mobility training;Neuromuscular re-education;Balance training;Taping;Dry needling;Energy conservation    PT Next Visit Plan  HEP and  goal review; pain modulation    PT Home Exercise Plan  pulleys flex/abd, supine passive IR and ER, standing rows red tband    Consulted and Agree with Plan of Care  Patient       Patient will benefit from skilled therapeutic intervention in order to improve the following deficits and impairments:  Decreased activity tolerance, Decreased strength, Increased fascial restricitons, Impaired flexibility, Impaired UE functional use, Postural dysfunction, Pain, Improper body mechanics, Decreased range of motion, Decreased endurance, Decreased mobility, Hypomobility, Impaired sensation  Visit Diagnosis: Chronic right shoulder pain     Problem List Patient Active Problem List   Diagnosis Date Noted  . Displacement of lumbar intervertebral disc without myelopathy 04/07/2010  . LUMBAR SPRAIN AND STRAIN 12/04/2009   Shelton Silvas PT, DPT Shelton Silvas 04/01/2018, 10:36 AM  [facility] [address] [place], [address] Phone: [phone]   Fax:  [phone]  Name: Laurie Wallace MRN: [id number] Date of Birth: September 02, 1945

## 2018-04-06 ENCOUNTER — Encounter: Payer: Self-pay | Admitting: Physical Therapy

## 2018-04-06 ENCOUNTER — Ambulatory Visit: Payer: BLUE CROSS/BLUE SHIELD | Attending: Orthopaedic Surgery | Admitting: Physical Therapy

## 2018-04-06 DIAGNOSIS — M25511 Pain in right shoulder: Secondary | ICD-10-CM | POA: Diagnosis present

## 2018-04-06 DIAGNOSIS — G8929 Other chronic pain: Secondary | ICD-10-CM

## 2018-04-06 NOTE — Therapy (Signed)
[facility] [address] [place], [address] Phone: [phone]   Fax:  [phone]  Physical Therapy Treatment  Patient Details  Name: Laurie Wallace MRN: [id number] Date of Birth: May 23, 1946 Referring Provider (PT): Sanjuana Kava, MD   Encounter Date: 04/06/2018  PT End of Session - 04/06/18 0953    Visit Number  3    Number of Visits  17    Date for PT Re-Evaluation  05/23/18    PT Start Time  0945    PT Stop Time  1030    PT Time Calculation (min)  45 min    Activity Tolerance  Patient tolerated treatment well    Behavior During Therapy  [facility] for tasks assessed/performed       Past Medical History:  Diagnosis Date  . Allergy   . Arthritis   . COPD (chronic obstructive pulmonary disease) ([place])     Past Surgical History:  Procedure Laterality Date  . ABDOMINAL HYSTERECTOMY    . TOE SURGERY Right    1st toe    There were no vitals filed for this visit.  Subjective Assessment - 04/06/18 0952    Subjective  Patient reports she feels like she is "moving her shoulder better" with 5/10 pain mostly with just overhead movements. Patient reports compliance with HEP with no questions or concerns.     Pertinent History  Patient is a 72 year old female that reports R shoulder pain that has been worsening since July following carrying a lot of furniture for her job at [employer]. Patient reports most pain with bringing her arm back down from an overhead position. Patient reports worst pain over the past week: 9/10 pain; and best 0/10 pain. Patient is currently unable to complete her work duties in [employer] and [employer] without pain. Pt denies N/V,B&B,  unexplained weight fluctuation, saddle paresthesia, fever, night sweats, or unrelenting night pain at this time.    Limitations  Lifting;House hold activities    How long can you sit comfortably?  unlimited    How long can you stand comfortably?   unlimited    How long can you walk comfortably?  unlimited     Diagnostic tests  Xray; unremarkable    Patient Stated Goals  Complete work duties (lifting trashcans, cleaning overhead)    Pain Onset  More than a month ago        Ther-Ex - Pulleys flexion x15 with 2sec hold in max ROM  - Pulleys abd x15 with 2sec hold in max ROM - AROM motion screen; full AROM in all shoulder motions except shoulder IR - AROM seated flexion with full ROM 2x 10 with minimal pain noted - AROM seated Abd with full ROM 2x 10 with minimal pain noted - Sleeper stretch 2x 30sec (HEP) - OMEGA standing rows 10# 3x 10 with max cuing for proper posture (prevent shoulder hiking/forward head/scap retraction) with good carry over by last set - Standing shoulder ER/IR red tband 3x 10 (each position) with max TC for proper form initially with good carry over folloiwng - Unliateral farmers carry 59ft (106ft down/69ft back) x3 (with 8#DB and 2 reps with 10# DB) with demo and max cuing for proper posture initially with good carry over following   Manual GHJ AP grade III-IV mob with movement into IR 30sec bouts 8 bouts to increase IR ROM  PT Education - 04/06/18 0953    Education Details  Exercise form    Person(s) Educated  Patient    Methods  Explanation;Demonstration;Verbal cues    Comprehension  Verbalized understanding;Returned demonstration;Verbal cues required       PT Short Term Goals - 03/28/18 1400      PT SHORT TERM GOAL #1   Title  Pt will be independent with HEP in order to improve strength and ROM in order to decrease fall risk and improve function at home and work.    Time  4    Period  Weeks    Status  New        PT Long Term Goals - 03/28/18 1401      PT LONG TERM GOAL #1   Title  Pt will demonstrate symmetrical shoulder strenght to return to PLOF to complete job duties without pain    Baseline  03/28/18 see eval    Time  8    Period  Weeks     Status  New      PT LONG TERM GOAL #2   Title  Patient will demonstrate symmetrical AROM wnl to complete job duties and ADLs    Baseline  03/28/18 see eval    Time  8    Period  Weeks    Status  New      PT LONG TERM GOAL #3   Title  Pt will decrease worst pain as reported on NPRS by at least 3 points in order to demonstrate clinically significant reduction in pain.    Baseline  03/28/18: worst pain 9/10    Time  8    Period  Weeks    Status  New      PT LONG TERM GOAL #4   Title  Patient will increase FOTO score to 67 to demonstrate predicted increase in functional mobility to complete ADLs    Baseline   54    Time  8    Period  Weeks    Status  New            Plan - 04/06/18 1047    Clinical Impression Statement  Patient is demonstrating increased AROM to full range in all motions aside from IR. PT initiated AROM and strengthening as able with patient reporting no pain. Patient is able to complete all therex with accuracy and no pain following PT cuing.     Rehab Potential  Good    Clinical Impairments Affecting Rehab Potential  (+) strong social support, motivation (-) age, sedentary lifestyle, other comborbidities, fear avoidance    PT Frequency  2x / week    PT Duration  8 weeks    PT Treatment/Interventions  ADLs/Self Care Home Management;Electrical Stimulation;Therapeutic activities;Passive range of motion;Manual techniques;Patient/family education;Therapeutic exercise;Iontophoresis 4mg /ml Dexamethasone;Aquatic Therapy;Moist Heat;Traction;Ultrasound;Cryotherapy;Functional mobility training;Neuromuscular re-education;Balance training;Taping;Dry needling;Energy conservation    PT Next Visit Plan  HEP and goal review; pain modulation    PT Home Exercise Plan  pulleys flex/abd, supine passive IR and ER, standing rows red tband    Consulted and Agree with Plan of Care  Patient       Patient will benefit from skilled therapeutic intervention in order to improve the following  deficits and impairments:  Decreased activity tolerance, Decreased strength, Increased fascial restricitons, Impaired flexibility, Impaired UE functional use, Postural dysfunction, Pain, Improper body mechanics, Decreased range of motion, Decreased endurance, Decreased mobility, Hypomobility, Impaired sensation  Visit Diagnosis: Chronic right shoulder pain  Problem List Patient Active Problem List   Diagnosis Date Noted  . Displacement of lumbar intervertebral disc without myelopathy 04/07/2010  . LUMBAR SPRAIN AND STRAIN 12/04/2009   Shelton Silvas PT, DPT  Shelton Silvas 04/06/2018, 10:52 AM  [facility] [address], [place], [address] Phone: [phone]   Fax:  [phone]  Name: Laurie Wallace MRN: [id number] Date of Birth: 1946/01/27

## 2018-04-11 ENCOUNTER — Ambulatory Visit: Payer: BLUE CROSS/BLUE SHIELD | Admitting: Physical Therapy

## 2018-04-11 ENCOUNTER — Encounter: Payer: Self-pay | Admitting: Physical Therapy

## 2018-04-11 DIAGNOSIS — M25511 Pain in right shoulder: Secondary | ICD-10-CM | POA: Diagnosis not present

## 2018-04-11 DIAGNOSIS — G8929 Other chronic pain: Secondary | ICD-10-CM

## 2018-04-11 NOTE — Therapy (Signed)
[facility] [address] [place], [address] Phone: [phone]   Fax:  [phone]  Physical Therapy Treatment  Patient Details  Name: Laurie Wallace MRN: [id number] Date of Birth: 1945/08/31 Referring Provider (PT): Sanjuana Kava, MD   Encounter Date: 04/11/2018  PT End of Session - 04/11/18 1445    Visit Number  4    Number of Visits  17    Date for PT Re-Evaluation  05/23/18    PT Start Time  0230    PT Stop Time  0315    PT Time Calculation (min)  45 min    Activity Tolerance  Patient tolerated treatment well    Behavior During Therapy  [facility] for tasks assessed/performed       Past Medical History:  Diagnosis Date  . Allergy   . Arthritis   . COPD (chronic obstructive pulmonary disease) ([place])     Past Surgical History:  Procedure Laterality Date  . ABDOMINAL HYSTERECTOMY    . TOE SURGERY Right    1st toe    There were no vitals filed for this visit.  Subjective Assessment - 04/11/18 1440    Subjective  Patient reports her motion was improving over the weekend, and that her pain was better 4/10 over the weekend. Patient reports she is only having a "little pain" with overhead motion, but is having more pain with unplanned movements such as having to "grab something quickly". Patient reports compliance with her HEP with no questions or concerns.     Pertinent History  Patient is a 72 year old female that reports R shoulder pain that has been worsening since July following carrying a lot of furniture for her job at [employer]. Patient reports most pain with bringing her arm back down from an overhead position. Patient reports worst pain over the past week: 9/10 pain; and best 0/10 pain. Patient is currently unable to complete her work duties in [employer] and [employer] without pain. Pt denies N/V,B&B,  unexplained weight fluctuation, saddle paresthesia, fever, night sweats, or unrelenting night  pain at this time.    Limitations  Lifting;House hold activities    How long can you sit comfortably?  unlimited    How long can you stand comfortably?  unlimited    How long can you walk comfortably?  unlimited     Diagnostic tests  Xray; unremarkable    Patient Stated Goals  Complete work duties (lifting trashcans, cleaning overhead)    Pain Onset  More than a month ago         Ther-Ex - Pulleys flexion x15 with 2sec hold in max ROM  - Pulleys abd x15 with 2sec hold in max ROM - AROM motion screen; full AROM in all shoulder motions except shoulder IR where she is able to reach to L1 without pain today, but does report some tension in the bicep muscle belly - OMEGA standing rows 10# 2x 10; 15# x10 with good carry over from previous session with min postural cuing need - Standing shoulder ER/IR green tband 3x 10 (each position) with min cuing initially, and good carry over from last session - Low rows green tband 3x 10 with TC for proper form with good carry over following - Eccentric bicep curl 5# 3x 10 with min cuing for proper posture with exercise - Doorway pec and bicep stretch x30 sec hold (HEP)  PT Education - 04/11/18 1444    Education Details  Exercise form    Person(s) Educated  Patient    Methods  Explanation;Verbal cues;Tactile cues    Comprehension  Verbalized understanding;Verbal cues required;Tactile cues required       PT Short Term Goals - 03/28/18 1400      PT SHORT TERM GOAL #1   Title  Pt will be independent with HEP in order to improve strength and ROM in order to decrease fall risk and improve function at home and work.    Time  4    Period  Weeks    Status  New        PT Long Term Goals - 03/28/18 1401      PT LONG TERM GOAL #1   Title  Pt will demonstrate symmetrical shoulder strenght to return to PLOF to complete job duties without pain    Baseline  03/28/18 see eval    Time  8    Period  Weeks    Status   New      PT LONG TERM GOAL #2   Title  Patient will demonstrate symmetrical AROM wnl to complete job duties and ADLs    Baseline  03/28/18 see eval    Time  8    Period  Weeks    Status  New      PT LONG TERM GOAL #3   Title  Pt will decrease worst pain as reported on NPRS by at least 3 points in order to demonstrate clinically significant reduction in pain.    Baseline  03/28/18: worst pain 9/10    Time  8    Period  Weeks    Status  New      PT LONG TERM GOAL #4   Title  Patient will increase FOTO score to 67 to demonstrate predicted increase in functional mobility to complete ADLs    Baseline   54    Time  8    Period  Weeks    Status  New            Plan - 04/11/18 1502    Clinical Impression Statement  Patient is continuing to report decreased pain, and increased motion allowing for therex progression . Patient is able to complete therex with accuracy and proper form following PT cuing, and is demonstrating good carry over between sessions of therex. Patient is having less pain with AROM of the shoulder, with some remaining deficits in IR. PT will continue strengthening/motion restoration as able.     Rehab Potential  Good    Clinical Impairments Affecting Rehab Potential  (+) strong social support, motivation (-) age, sedentary lifestyle, other comborbidities, fear avoidance    PT Frequency  2x / week    PT Duration  8 weeks    PT Treatment/Interventions  ADLs/Self Care Home Management;Electrical Stimulation;Therapeutic activities;Passive range of motion;Manual techniques;Patient/family education;Therapeutic exercise;Iontophoresis 4mg /ml Dexamethasone;Aquatic Therapy;Moist Heat;Traction;Ultrasound;Cryotherapy;Functional mobility training;Neuromuscular re-education;Balance training;Taping;Dry needling;Energy conservation    PT Next Visit Plan  Continued postural muscle strengthening    PT Home Exercise Plan  doorway pec/bicep stretch, pulleys flex/abd, supine passive IR and  ER, standing rows red tband    Consulted and Agree with Plan of Care  Patient       Patient will benefit from skilled therapeutic intervention in order to improve the following deficits and impairments:  Decreased activity tolerance, Decreased strength, Increased fascial restricitons, Impaired flexibility, Impaired UE functional use, Postural dysfunction, Pain, Improper  body mechanics, Decreased range of motion, Decreased endurance, Decreased mobility, Hypomobility, Impaired sensation  Visit Diagnosis: Chronic right shoulder pain     Problem List Patient Active Problem List   Diagnosis Date Noted  . Displacement of lumbar intervertebral disc without myelopathy 04/07/2010  . LUMBAR SPRAIN AND STRAIN 12/04/2009   Shelton Silvas PT, DPT Shelton Silvas 04/11/2018, 3:12 PM  [place] [facility] [address] [place], [address] Phone: [phone]   Fax:  [phone]  Name: MALASHIA KAMAKA MRN: [id number] Date of Birth: [birth year]

## 2018-04-13 ENCOUNTER — Ambulatory Visit: Payer: BLUE CROSS/BLUE SHIELD | Admitting: Physical Therapy

## 2018-04-13 ENCOUNTER — Encounter: Payer: Self-pay | Admitting: Physical Therapy

## 2018-04-13 DIAGNOSIS — M25511 Pain in right shoulder: Secondary | ICD-10-CM | POA: Diagnosis not present

## 2018-04-13 DIAGNOSIS — G8929 Other chronic pain: Secondary | ICD-10-CM

## 2018-04-13 NOTE — Therapy (Signed)
[place] [facility] [address] [place], [address] Phone: [phone]   Fax:  [phone]  Physical Therapy Treatment  Patient Details  Name: Laurie Wallace MRN: [id number] Date of Birth: [birth year] Referring Provider (PT): Sanjuana Kava, MD   Encounter Date: 04/13/2018  PT End of Session - 04/13/18 1502    Visit Number  5    Number of Visits  17    Date for PT Re-Evaluation  05/23/18    PT Start Time  0235    PT Stop Time  0315    PT Time Calculation (min)  40 min    Activity Tolerance  Patient tolerated treatment well    Behavior During Therapy  [facility] for tasks assessed/performed       Past Medical History:  Diagnosis Date  . Allergy   . Arthritis   . COPD (chronic obstructive pulmonary disease) ([place])     Past Surgical History:  Procedure Laterality Date  . ABDOMINAL HYSTERECTOMY    . TOE SURGERY Right    1st toe    There were no vitals filed for this visit.  Subjective Assessment - 04/13/18 1440    Subjective  Patient reports minimal pain today and reports her motion is improving. Patient reports she is completing her HEP with no questions or concerns.     Pertinent History  Patient is a 72 year old female that reports R shoulder pain that has been worsening since July following carrying a lot of furniture for her job at [employer]. Patient reports most pain with bringing her arm back down from an overhead position. Patient reports worst pain over the past week: 9/10 pain; and best 0/10 pain. Patient is currently unable to complete her work duties in [employer] and [employer]s without pain. Pt denies N/V,B&B,  unexplained weight fluctuation, saddle paresthesia, fever, night sweats, or unrelenting night pain at this time.    Limitations  Lifting;House hold activities    How long can you sit comfortably?  unlimited    How long can you stand comfortably?  unlimited    How long can you walk  comfortably?  unlimited     Diagnostic tests  Xray; unremarkable    Patient Stated Goals  Complete work duties (lifting trashcans, cleaning overhead)    Pain Onset  More than a month ago          Ther-Ex UBE forward 11min backward 23min - AROM motion screen; full AROM in all shoulder motions except shoulder IR where she is able to reach to L2 without pain today - OMEGA standing rows 15# 3 x10 with good carry over from last session with some cuing for full scap retraction - Standing shoulder ER/IR green tband 3x 10 with good carry over from last session - Standing scaption w/2# DB in each hand and min TC initially for proper posture/form with good carry over following -OMEGA chest press 15# 3x 10 with good carry over following demo from PT - OMEGA tricep ext 15# 10/9/9 w/ demo and TC required w/ proper form following                     PT Education - 04/13/18 1441    Education Details  Exercise form    Person(s) Educated  Patient    Methods  Explanation;Demonstration;Verbal cues    Comprehension  Verbalized understanding;Returned demonstration;Verbal cues required       PT Short Term Goals - 03/28/18  1400      PT SHORT TERM GOAL #1   Title  Pt will be independent with HEP in order to improve strength and ROM in order to decrease fall risk and improve function at home and work.    Time  4    Period  Weeks    Status  New        PT Long Term Goals - 03/28/18 1401      PT LONG TERM GOAL #1   Title  Pt will demonstrate symmetrical shoulder strenght to return to PLOF to complete job duties without pain    Baseline  03/28/18 see eval    Time  8    Period  Weeks    Status  New      PT LONG TERM GOAL #2   Title  Patient will demonstrate symmetrical AROM wnl to complete job duties and ADLs    Baseline  03/28/18 see eval    Time  8    Period  Weeks    Status  New      PT LONG TERM GOAL #3   Title  Pt will decrease worst pain as reported on NPRS by at least 3  points in order to demonstrate clinically significant reduction in pain.    Baseline  03/28/18: worst pain 9/10    Time  8    Period  Weeks    Status  New      PT LONG TERM GOAL #4   Title  Patient will increase FOTO score to 67 to demonstrate predicted increase in functional mobility to complete ADLs    Baseline   54    Time  8    Period  Weeks    Status  New            Plan - 04/13/18 1528    Clinical Impression Statement  PT continued therex progression for functional, job activities, which patient is able to complete with accuracy following PT cuing/demo. If patient continues to report no pain and demonstrate accuracy with therex, patient may d.c PT to robust HEP next week, as she reports a busy schedule when she has clearance to return to work.     Rehab Potential  Good    Clinical Impairments Affecting Rehab Potential  (+) strong social support, motivation (-) age, sedentary lifestyle, other comborbidities, fear avoidance    PT Frequency  2x / week    PT Duration  8 weeks    PT Treatment/Interventions  ADLs/Self Care Home Management;Electrical Stimulation;Therapeutic activities;Passive range of motion;Manual techniques;Patient/family education;Therapeutic exercise;Iontophoresis 4mg /ml Dexamethasone;Aquatic Therapy;Moist Heat;Traction;Ultrasound;Cryotherapy;Functional mobility training;Neuromuscular re-education;Balance training;Taping;Dry needling;Energy conservation    PT Next Visit Plan  Continued postural muscle strengthening    PT Home Exercise Plan  doorway pec/bicep stretch, pulleys flex/abd, supine passive IR and ER, standing rows red tband    Consulted and Agree with Plan of Care  Patient       Patient will benefit from skilled therapeutic intervention in order to improve the following deficits and impairments:  Decreased activity tolerance, Decreased strength, Increased fascial restricitons, Impaired flexibility, Impaired UE functional use, Postural dysfunction, Pain,  Improper body mechanics, Decreased range of motion, Decreased endurance, Decreased mobility, Hypomobility, Impaired sensation  Visit Diagnosis: Chronic right shoulder pain     Problem List Patient Active Problem List   Diagnosis Date Noted  . Displacement of lumbar intervertebral disc without myelopathy 04/07/2010  . LUMBAR SPRAIN AND STRAIN 12/04/2009   Shelton Silvas PT, DPT [facility]  [place] 04/13/2018, 3:31 PM  [place] [facility] [address] [place], [address] Phone: [phone]   Fax:  [phone]  Name: Laurie Wallace MRN: [id number] Date of Birth: 1946-04-25

## 2018-04-19 ENCOUNTER — Encounter: Payer: Self-pay | Admitting: Physical Therapy

## 2018-04-19 ENCOUNTER — Ambulatory Visit: Payer: BLUE CROSS/BLUE SHIELD | Admitting: Physical Therapy

## 2018-04-19 DIAGNOSIS — M25511 Pain in right shoulder: Principal | ICD-10-CM

## 2018-04-19 DIAGNOSIS — G8929 Other chronic pain: Secondary | ICD-10-CM

## 2018-04-19 NOTE — Therapy (Signed)
[place] [facility] [address] [place], [address] Phone: [phone]   Fax:  [phone]  Physical Therapy Treatment  Patient Details  Name: Laurie Wallace MRN: [id number] Date of Birth: [birth year] Referring Provider (PT): Sanjuana Kava, MD   Encounter Date: 04/19/2018  PT End of Session - 04/19/18 1125    Visit Number  6    Number of Visits  17    Date for PT Re-Evaluation  05/23/18    PT Start Time  1120    PT Stop Time  1200    PT Time Calculation (min)  40 min    Activity Tolerance  Patient tolerated treatment well    Behavior During Therapy  [facility] for tasks assessed/performed       Past Medical History:  Diagnosis Date  . Allergy   . Arthritis   . COPD (chronic obstructive pulmonary disease) ([place])     Past Surgical History:  Procedure Laterality Date  . ABDOMINAL HYSTERECTOMY    . TOE SURGERY Right    1st toe    There were no vitals filed for this visit.  Subjective Assessment - 04/19/18 1124    Subjective  Patient reports no pain in the shoulder through the weekend and reports she is "ready to go back to work". Patient reports she has been able to complete all her house chores without pain. Patient has follow up with MD tomorrow for return to work release. Reports compliance with HEP with no questions or concerns    Pertinent History  Patient is a 72 year old female that reports R shoulder pain that has been worsening since July following carrying a lot of furniture for her job at [employer]. Patient reports most pain with bringing her arm back down from an overhead position. Patient reports worst pain over the past week: 9/10 pain; and best 0/10 pain. Patient is currently unable to complete her work duties in [employer] and [employer] without pain. Pt denies N/V,B&B,  unexplained weight fluctuation, saddle paresthesia, fever, night sweats, or unrelenting night pain at this time.    Limitations   Lifting;House hold activities    How long can you sit comfortably?  unlimited    How long can you stand comfortably?  unlimited    How long can you walk comfortably?  unlimited     Diagnostic tests  Xray; unremarkable    Patient Stated Goals  Complete work duties (lifting trashcans, cleaning overhead)    Pain Onset  More than a month ago       Ther-Ex UBE forward 101min backward 84min - AROM motion screen; full AROM in all shoulder motions except shoulder IRwhere she is able to reach to L2 without pain today, just "stiffness"; full passive IR (with R shoulder patient demonstrates L5 active IR). PT encouraged patient to continue AA IR and IR strengthening at home, but this lack of active IR is not inhibiting functional activities at this time - Seated military press 2# DB x10; 3# DB 2x 10 with TC initially with good carry over following - Small body blade side to side 2x 15sec; up and down 2x 15sec (patient unable to keep shoulder in proper position with large body blade) with max cuing and TC initially for proper shoulder placement with good carry over following - Overhead farmers carry 5# 27ft x4 with TC initially for proper shoulder placement with good carry over following  - Standing shoulder flex/ ant delt 2# DB  in each hand 3x 10 - Standing IR with green tband 3x 10 with min cuing to prevent compensation - Seated lat pulldown 15# 3x 10 with demo prior and TC initially for proper form with good carry over following                            PT Education - 04/19/18 1125    Education Details  Exercise technique    Person(s) Educated  Patient    Methods  Explanation;Demonstration;Verbal cues    Comprehension  Verbalized understanding;Returned demonstration;Verbal cues required       PT Short Term Goals - 03/28/18 1400      PT SHORT TERM GOAL #1   Title  Pt will be independent with HEP in order to improve strength and ROM in order to decrease fall risk and  improve function at home and work.    Time  4    Period  Weeks    Status  New        PT Long Term Goals - 03/28/18 1401      PT LONG TERM GOAL #1   Title  Pt will demonstrate symmetrical shoulder strenght to return to PLOF to complete job duties without pain    Baseline  03/28/18 see eval    Time  8    Period  Weeks    Status  New      PT LONG TERM GOAL #2   Title  Patient will demonstrate symmetrical AROM wnl to complete job duties and ADLs    Baseline  03/28/18 see eval    Time  8    Period  Weeks    Status  New      PT LONG TERM GOAL #3   Title  Pt will decrease worst pain as reported on NPRS by at least 3 points in order to demonstrate clinically significant reduction in pain.    Baseline  03/28/18: worst pain 9/10    Time  8    Period  Weeks    Status  New      PT LONG TERM GOAL #4   Title  Patient will increase FOTO score to 67 to demonstrate predicted increase in functional mobility to complete ADLs    Baseline   54    Time  8    Period  Weeks    Status  New            Plan - 04/19/18 1145    Clinical Impression Statement  PT continued functional therex progression, which patient is able to complete with accuracy. Patient is continuing to lack some active IR, but is close to baseline. Patient reports that if she returns to work, she will have less time for PT sessions. PT and patient discussed continuing strengthening with HEP and tentitive DC next appt per MD clearance for work tomorrow.     Rehab Potential  Good    Clinical Impairments Affecting Rehab Potential  (+) strong social support, motivation (-) age, sedentary lifestyle, other comborbidities, fear avoidance    PT Treatment/Interventions  ADLs/Self Care Home Management;Electrical Stimulation;Therapeutic activities;Passive range of motion;Manual techniques;Patient/family education;Therapeutic exercise;Iontophoresis 4mg /ml Dexamethasone;Aquatic Therapy;Moist Heat;Traction;Ultrasound;Cryotherapy;Functional  mobility training;Neuromuscular re-education;Balance training;Taping;Dry needling;Energy conservation    PT Next Visit Plan  DC PT    PT Home Exercise Plan  doorway pec/bicep stretch, pulleys flex/abd, supine passive IR and ER, standing rows green tband, standing IR and ER green tband  Consulted and Agree with Plan of Care  Patient       Patient will benefit from skilled therapeutic intervention in order to improve the following deficits and impairments:  Decreased activity tolerance, Decreased strength, Increased fascial restricitons, Impaired flexibility, Impaired UE functional use, Postural dysfunction, Pain, Improper body mechanics, Decreased range of motion, Decreased endurance, Decreased mobility, Hypomobility, Impaired sensation  Visit Diagnosis: Chronic right shoulder pain     Problem List Patient Active Problem List   Diagnosis Date Noted  . Displacement of lumbar intervertebral disc without myelopathy 04/07/2010  . LUMBAR SPRAIN AND STRAIN 12/04/2009   Shelton Silvas PT, DPT Shelton Silvas 04/19/2018, 12:03 PM  [place] [facility] [address] [place], [address] Phone: [phone]   Fax:  [phone]  Name: SONYA GUNNOE MRN: [id number] Date of Birth: July 24, 1945

## 2018-04-20 ENCOUNTER — Ambulatory Visit: Payer: BLUE CROSS/BLUE SHIELD | Admitting: Orthopaedic Surgery

## 2018-04-20 ENCOUNTER — Encounter: Payer: Self-pay | Admitting: Orthopaedic Surgery

## 2018-04-20 VITALS — BP 120/69 | HR 74 | Ht 66.0 in | Wt 129.0 lb

## 2018-04-20 DIAGNOSIS — M25511 Pain in right shoulder: Secondary | ICD-10-CM

## 2018-04-20 NOTE — Progress Notes (Signed)
CC:  My shoulder is not hurting  She has been to PT.  She has no pain of the right shoulder.  NV intact. ROM is full to the right shoulder.  I have reviewed the PT/OT report.  Encounter Diagnosis  Name Primary?  . Pain in joint of right shoulder Yes   I will discharge her today.  Call if any problem.  Precautions discussed.   Electronically Signed Sanjuana Kava, MD 10/16/20191:42 PM

## 2018-04-20 NOTE — Patient Instructions (Signed)
Regular work

## 2018-04-21 ENCOUNTER — Ambulatory Visit: Payer: BLUE CROSS/BLUE SHIELD | Admitting: Physical Therapy

## 2018-04-21 DIAGNOSIS — M25511 Pain in right shoulder: Principal | ICD-10-CM

## 2018-04-21 DIAGNOSIS — G8929 Other chronic pain: Secondary | ICD-10-CM

## 2018-04-21 NOTE — Therapy (Signed)
PHYSICAL THERAPY DISCHARGE SUMMARY  Visits from Start of Care: 7  Current functional level related to goals / functional outcomes: Back to baseline, all goals met.    Remaining deficits: *see below    Education / Equipment: *HEP updated  Plan: Patient agrees to discharge.  Patient goals were met. Patient is being discharged due to meeting the stated rehab goals.  ?????           9:43 AM, 04/21/18 Etta Grandchild, PT, DPT Physical Therapist - [place] [phone] (Office)    [place] [facility] [address], [place], [address] Phone: [phone]   Fax:  [phone]  Physical Therapy Treatment  Patient Details  Name: Laurie Wallace MRN: [id number] Date of Birth: 08-Sep-1945 Referring Provider (PT): Sanjuana Kava, MD   Encounter Date: 04/21/2018  PT End of Session - 04/21/18 0919    Visit Number  7    Number of Visits  17    Date for PT Re-Evaluation  05/23/18    PT Start Time  0901    PT Stop Time  0931    PT Time Calculation (min)  30 min    Activity Tolerance  Patient tolerated treatment well    Behavior During Therapy  [facility] for tasks assessed/performed       Past Medical History:  Diagnosis Date  . Allergy   . Arthritis   . COPD (chronic obstructive pulmonary disease) ([place])     Past Surgical History:  Procedure Laterality Date  . ABDOMINAL HYSTERECTOMY    . TOE SURGERY Right    1st toe    There were no vitals filed for this visit.  Subjective Assessment - 04/21/18 0918    Subjective  Pt saw Dr Luna Glasgow yesterday adn is released back to work. She is no longer having pain. She reports she has met all of her goals.     Pertinent History  Patient is a 72 year old female that reports R shoulder pain that has been worsening since July following carrying a lot of furniture for her job at [employer]. Patient reports most pain with bringing her arm back down from an overhead position.  Patient reports worst pain over the past week: 9/10 pain; and best 0/10 pain. Patient is currently unable to complete her work duties in [employer] and [employer] without pain. Pt denies N/V,B&B,  unexplained weight fluctuation, saddle paresthesia, fever, night sweats, or unrelenting night pain at this time.    How long can you sit comfortably?  unlimited    How long can you stand comfortably?  unlimited    How long can you walk comfortably?  unlimited     Diagnostic tests  Xray; unremarkable    Patient Stated Goals  Complete work duties (lifting trashcans, cleaning overhead)    Currently in Pain?  No/denies          Therex/HEP education this date:  -scapular elevation 1x10x3secH -scapular retraction 1x10x3sec -Supine shoulder flexion 1x10 _0  weight  -Standing Shoulder Abduction 1x10 _1  weight    PT Short Term Goals - 04/21/18 0928      PT SHORT TERM GOAL #1   Title  Pt will be independent with HEP in order to improve strength and ROM in order to decrease fall risk and improve function at home and work.    Status  Achieved        PT Long Term Goals - 04/21/18 1245  PT LONG TERM GOAL #1   Title  Pt will demonstrate symmetrical shoulder strenght to return to PLOF to complete job duties without pain    Status  Achieved      PT LONG TERM GOAL #2   Title  Patient will demonstrate symmetrical AROM wnl to complete job duties and ADLs    Status  Achieved      PT LONG TERM GOAL #3   Title  Pt will decrease worst pain as reported on NPRS by at least 3 points in order to demonstrate clinically significant reduction in pain.    Status  Achieved      PT LONG TERM GOAL #4   Title  Patient will increase FOTO score to 67 to demonstrate predicted increase in functional mobility to complete ADLs    Baseline   54: 72 at reassessment on 10/17    Status  Achieved            Plan - 04/21/18 0920    Clinical Impression Statement  Reassessment this date. Pt demonstrates  improved strength and ROM in RIght shoulder, all pain free now, although heavy limitations persist. Pt is released back ot work by ortho as of 1DA and pt is agreeable to DC this date. All goals met. HEP is taught and updated.     Clinical Presentation  Stable    Clinical Presentation due to:  Objective tests and measures.     Clinical Decision Making  Moderate    Rehab Potential  Good    Clinical Impairments Affecting Rehab Potential  (+) strong social support, motivation (-) age, sedentary lifestyle, other comborbidities, fear avoidance    PT Frequency  2x / week    PT Duration  8 weeks    PT Treatment/Interventions  ADLs/Self Care Home Management;Electrical Stimulation;Therapeutic activities;Passive range of motion;Manual techniques;Patient/family education;Therapeutic exercise;Iontophoresis 82m/ml Dexamethasone;Aquatic Therapy;Moist Heat;Traction;Ultrasound;Cryotherapy;Functional mobility training;Neuromuscular re-education;Balance training;Taping;Dry needling;Energy conservation    PT Next Visit Plan  Pt i snow DC from PT     Consulted and Agree with Plan of Care  Patient       Patient will benefit from skilled therapeutic intervention in order to improve the following deficits and impairments:  Decreased activity tolerance, Decreased strength, Increased fascial restricitons, Impaired flexibility, Impaired UE functional use, Postural dysfunction, Pain, Improper body mechanics, Decreased range of motion, Decreased endurance, Decreased mobility, Hypomobility, Impaired sensation  Visit Diagnosis: Chronic right shoulder pain     Problem List Patient Active Problem List   Diagnosis Date Noted  . Displacement of lumbar intervertebral disc without myelopathy 04/07/2010  . LUMBAR SPRAIN AND STRAIN 12/04/2009   9:43 AM, 04/21/18 AEtta Grandchild PT, DPT Physical Therapist - [id number] [phone](Office)    [facility] 04/21/2018, 9:35 AM  [facility] [address] [place] [address]Phone: [phone]  Fax:  [phone] Name: Laurie BLOODSWORTHMRN: [id number] of Birth: 730-Jan-1947

## 2018-04-25 ENCOUNTER — Ambulatory Visit: Payer: BLUE CROSS/BLUE SHIELD | Admitting: Physical Therapy

## 2018-04-28 ENCOUNTER — Ambulatory Visit: Payer: BLUE CROSS/BLUE SHIELD | Admitting: Physical Therapy

## 2018-05-02 ENCOUNTER — Encounter: Payer: BLUE CROSS/BLUE SHIELD | Admitting: Physical Therapy

## 2018-05-05 ENCOUNTER — Encounter: Payer: BLUE CROSS/BLUE SHIELD | Admitting: Physical Therapy

## 2018-05-09 ENCOUNTER — Encounter: Payer: BLUE CROSS/BLUE SHIELD | Admitting: Physical Therapy

## 2018-05-12 ENCOUNTER — Encounter: Payer: BLUE CROSS/BLUE SHIELD | Admitting: Physical Therapy

## 2018-05-16 ENCOUNTER — Encounter: Payer: BLUE CROSS/BLUE SHIELD | Admitting: Physical Therapy

## 2018-05-18 ENCOUNTER — Encounter: Payer: BLUE CROSS/BLUE SHIELD | Admitting: Physical Therapy

## 2018-05-23 ENCOUNTER — Encounter: Payer: Self-pay | Admitting: Physical Therapy

## 2018-05-26 ENCOUNTER — Encounter: Payer: Self-pay | Admitting: Physical Therapy

## 2018-05-30 ENCOUNTER — Encounter: Payer: Self-pay | Admitting: Physical Therapy

## 2018-06-01 ENCOUNTER — Encounter: Payer: Self-pay | Admitting: Physical Therapy

## 2020-03-31 ENCOUNTER — Ambulatory Visit (INDEPENDENT_AMBULATORY_CARE_PROVIDER_SITE_OTHER): Payer: BC Managed Care – PPO

## 2020-03-31 ENCOUNTER — Other Ambulatory Visit: Payer: Self-pay

## 2020-03-31 ENCOUNTER — Ambulatory Visit
Admission: EM | Admit: 2020-03-31 | Discharge: 2020-03-31 | Disposition: A | Discharge status: Home / Self Care | Payer: BC Managed Care – PPO

## 2020-03-31 DIAGNOSIS — K0889 Other specified disorders of teeth and supporting structures: Secondary | ICD-10-CM

## 2020-03-31 DIAGNOSIS — M542 Cervicalgia: Secondary | ICD-10-CM

## 2020-03-31 MED ORDER — METHOCARBAMOL 500 MG PO TABS: 500.0000 mg | ORAL_TABLET | Freq: Two times a day (BID) | ORAL | 0 refills | Status: DC

## 2020-03-31 NOTE — Discharge Instructions (Addendum)
Continue to use the Tramadol and OTC Tylenol as needed for pain.  Continue the Augmentin you were prescribed.  You need to be seen by a dentist. You can follow-up at [facility]. They have an urgent care that opens at 9 am Monday through Friday and is first come, first serve.  Take the Methocarbamol for your neck pain. You can also use rice packs or a heating pad to help alleviate pain.

## 2020-03-31 NOTE — ED Triage Notes (Signed)
Patient in today w/ c/o dental pain. Patient states sx onset was over a week ago.

## 2020-03-31 NOTE — ED Provider Notes (Signed)
[facility]    CSN: [id number] Arrival date & time: 03/31/20  1244      History   Chief Complaint Chief Complaint  Patient presents with  . Dental Pain    HPI Laurie Wallace is a 74 y.o. female.   74 yo female here for evaluation of dental pain, neck pain and HA.  She reports that her pain has been going on for over 1 week.  When her jaw on the left started to hurt she also developed left sided neck pain and a left sided occipital headache.  She has not seen a dentist for her dental pain but she was evaluated at the Urgent Care in [place] 2 days ago and given Tramadol and Augmentin.  She denies fever. Has ahd a decreased appetite.      Past Medical History:  Diagnosis Date  . Allergy   . Arthritis   . COPD (chronic obstructive pulmonary disease) [facility])     Patient Active Problem List   Diagnosis Date Noted  . Displacement of lumbar intervertebral disc without myelopathy 04/07/2010  . LUMBAR SPRAIN AND STRAIN 12/04/2009    Past Surgical History:  Procedure Laterality Date  . ABDOMINAL HYSTERECTOMY    . TOE SURGERY Right    1st toe    OB History    Gravida  2   Para  2   Term  2   Preterm      AB      Living  1     SAB      TAB      Ectopic      Multiple      Live Births               Home Medications    Prior to Admission medications   Medication Sig Start Date End Date Taking? Authorizing Provider  amoxicillin-clavulanate (AUGMENTIN) 875-125 MG tablet SMARTSIG:1 Tablet(s) By Mouth Every 12 Hours 03/29/20  Yes [provider]  brompheniramine (VAZOL) 2 MG/5ML LIQD Take 2 mg by mouth 2 (two) times daily.   Yes [provider]  fluticasone (FLONASE) 50 MCG/ACT nasal spray Place 2 sprays into the nose daily.     Yes [provider]  HYDROcodone-acetaminophen (NORCO/VICODIN) 5-325 MG tablet One tablet every four hours as needed for acute pain.  Limit of five days per [place].  03/31/18  Yes Sanjuana Kava, MD  levocetirizine (XYZAL) 5 MG tablet TAKE 1 TABLET BY MOUTH AT BEDTIME AS NEEDED FOR ALLERGIES 02/04/18  Yes [provider]  meloxicam (MOBIC) 7.5 MG tablet Take 1 tablet (7.5 mg total) by mouth daily. 02/27/18  Yes Caryl Ada K, PA-C  methocarbamol (ROBAXIN) 500 MG tablet Take 1 tablet (500 mg total) by mouth 2 (two) times daily. 02/27/18  Yes Caryl Ada K, PA-C  Pseudoephedrine-Acetaminophen (EQ SINUS MAXIMUM STRENGTH PO) Take 1 tablet by mouth daily as needed. For sinus congestion    Yes [provider]  traMADol (ULTRAM) 50 MG tablet Take 50 mg by mouth every 8 (eight) hours as needed. 03/29/20  Yes [provider]  benzonatate (TESSALON) 200 MG capsule Take 1 capsule (200 mg total) by mouth 3 (three) times daily as needed for cough. 02/15/15   Norval Gable, MD    Family History Family History  Problem Relation Age of Onset  . Cancer Mother     Social History Social History   Tobacco Use  . Smoking status: Never Smoker  . Smokeless  tobacco: Never Used  Vaping Use  . Vaping Use: Never used  Substance Use Topics  . Alcohol use: No  . Drug use: No     Allergies   Patient has no known allergies.   Review of Systems Review of Systems  Constitutional: Positive for appetite change. Negative for chills and fever.  HENT: Negative for congestion, rhinorrhea and sore throat.   Respiratory: Negative for cough and shortness of breath.   Cardiovascular: Negative for chest pain.  Gastrointestinal: Negative for nausea and vomiting.  Genitourinary: Negative for dysuria and frequency.  Musculoskeletal: Positive for arthralgias, myalgias and neck pain.  Skin: Negative.   Neurological: Positive for headaches.  Hematological: Negative.   Psychiatric/Behavioral: Negative.      Physical Exam Triage Vital Signs ED Triage Vitals  Enc Vitals Group     BP 03/31/20 1345 120/77     Pulse Rate 03/31/20 1345 86     Resp 03/31/20  1345 16     Temp 03/31/20 1345 97.7 F (36.5 C)     Temp Source 03/31/20 1345 Oral     SpO2 03/31/20 1345 99 %     Weight --      Height --      Head Circumference --      Peak Flow --      Pain Score 03/31/20 1346 9     Pain Loc --      Pain Edu? --      Excl. in [place]? --    No data found.  Updated Vital Signs BP 120/77 (BP Location: Left Arm)   Pulse 86   Temp 97.7 F (36.5 C) (Oral)   Resp 16   SpO2 99%   Visual Acuity Right Eye Distance:   Left Eye Distance:   Bilateral Distance:    Right Eye Near:   Left Eye Near:    Bilateral Near:     Physical Exam Vitals and nursing note reviewed.  Constitutional:      Appearance: Normal appearance.  HENT:     Head: Normocephalic and atraumatic.     Right Ear: Tympanic membrane, ear canal and external ear normal.     Left Ear: Tympanic membrane, ear canal and external ear normal.     Nose: Nose normal.     Mouth/Throat:     Mouth: Mucous membranes are moist.     Comments: Patient is missing the majority of her teeth. There are two molars on the back lower left that are rotted to the gumline. She also has three on the upper right that are rotted to the gumline as well. Nubs are black and the gums are red. No pus draining and the submental and submandibular regions are not edematous or boggy.  Eyes:     General: Lids are normal.     Extraocular Movements: Extraocular movements intact.     Conjunctiva/sclera: Conjunctivae normal.     Pupils: Pupils are equal, round, and reactive to light.  Neck:     Comments: Patient has decreased ROM in all directions. She has tenderness on the left side of her neck near her occiput.  There is a fair amount of spasm and tenderness to the trapezius on the left.  Cardiovascular:     Rate and Rhythm: Normal rate and regular rhythm.     Pulses: Normal pulses.     Heart sounds: Normal heart sounds.  Pulmonary:     Effort: Pulmonary effort is normal.     Breath sounds:  Normal breath sounds.    Musculoskeletal:        General: Normal range of motion.     Cervical back: Tenderness present.  Lymphadenopathy:     Cervical: Cervical adenopathy present.  Skin:    General: Skin is warm and dry.     Capillary Refill: Capillary refill takes less than 2 seconds.  Neurological:     General: No focal deficit present.     Mental Status: She is alert and oriented to person, place, and time.     Cranial Nerves: Cranial nerves are intact.     Sensory: Sensation is intact.     Motor: Motor function is intact.     Coordination: Coordination is intact.     Gait: Gait is intact.  Psychiatric:        Mood and Affect: Mood normal.        Behavior: Behavior normal.        Thought Content: Thought content normal.        Judgment: Judgment normal.      UC Treatments / Results  Labs (all labs ordered are listed, but only abnormal results are displayed) Labs Reviewed - No data to display  EKG   Radiology DG Cervical Spine 2-3 Views  Result Date: 03/31/2020 CLINICAL DATA:  Neck pain EXAM: CERVICAL SPINE - 3 VIEW COMPARISON:  01/08/2013 FINDINGS: Seven cervical segments are well visualized. Mild facet hypertrophic changes are noted with anterolisthesis of C4 on C5. No acute fracture or acute facet abnormality is noted. The odontoid is within normal limits. IMPRESSION: Mild degenerative change as described. The overall appearance is stable from the prior exam. Electronically Signed   By: Inez Catalina M.D.   On: 03/31/2020 15:42    Procedures Procedures (including critical care time)  Medications Ordered in UC Medications - No data to display  Initial Impression / Assessment and Plan / UC Course  I have reviewed the triage vital signs and the nursing notes.  Pertinent labs & imaging results that were available during my care of the patient were reviewed by me and considered in my medical decision making (see chart for details).   Patient is here for evaluation of dental pain, HA, and  neck pain that has been present for over a week. She denies trauma. She was evaluated and treated for the dental pain 2 days ago. She says the Tramadol is helping the dental pain but not the neck pain or HA. No neuro deficits on exam. There is moderate spasm and tenderness to the neck muscles and bony tenderness at C2 on the left.   Will image neck. Patient is being treated for her dental pain.   C-spine negative for fracture or subluxation.  Will D/C home with Methocarbamol for spasm and have her continued prescribed therapy for dental caries. Will also refer her to [place].  Final Clinical Impressions(s) / UC Diagnoses   Final diagnoses:  Cervicalgia  Pain, dental     Discharge Instructions     Continue to use the Tramadol and OTC Tylenol as needed for pain.  Continue the Augmentin you were prescribed.  You need to be seen by a dentist. You can follow-up at [facility]. They have an urgent care that opens at 9 am Monday through Friday and is first come, first serve.  Take the Methocarbamol for your neck pain. You can also use rice packs or a heating pad to help alleviate pain.  ED Prescriptions    None     I have reviewed the PDMP during this encounter.   Margarette Canada, NP 03/31/20 1558

## 2020-07-23 ENCOUNTER — Encounter: Payer: Self-pay | Admitting: Emergency Medicine

## 2020-07-23 ENCOUNTER — Ambulatory Visit
Admission: EM | Admit: 2020-07-23 | Discharge: 2020-07-23 | Disposition: A | Discharge status: Home / Self Care | Payer: BC Managed Care – PPO | Attending: Family Medicine | Admitting: Family Medicine

## 2020-07-23 ENCOUNTER — Other Ambulatory Visit: Payer: Self-pay

## 2020-07-23 DIAGNOSIS — M2550 Pain in unspecified joint: Secondary | ICD-10-CM

## 2020-07-23 MED ORDER — PREDNISONE 10 MG PO TABS: ORAL_TABLET | ORAL | 0 refills | Status: DC

## 2020-07-23 NOTE — Discharge Instructions (Signed)
Please get yourself a primary care physician - Try [facility], [facility], or [facility].  Medication as prescribed.  Take care  Dr. Lacinda Axon

## 2020-07-23 NOTE — ED Triage Notes (Signed)
Patient in today c/o body pain, knee pain, neck pain x ~ 2 months. Patient denies any respiratory symptoms. Patient seen for neck pain on 03/31/20 at [facility] and was prescribed a muscle relaxer. Patient states her symptoms did improve, but have returned.

## 2020-07-23 NOTE — ED Provider Notes (Signed)
[facility]    CSN: [id number] Arrival date & time: 07/23/20  1026      History   Chief Complaint Chief Complaint  Patient presents with  . Generalized Body Aches  . Knee Pain  . Hand Pain   HPI  75 year old female presents with polyarthralgia.  Patient reports ongoing joint pains since November.  She reports neck pain, left shoulder pain, low back pain, left knee pain, right knee pain.  Patient reports stiffness.  She has taken some ibuprofen without relief.  No known relieving factors.  Seems to be worse in the morning and initially when she gets up and moves about.  Pain currently 5/10 in severity.  No other reported symptoms.  No other complaints.  Past Medical History:  Diagnosis Date  . Allergy   . Arthritis   . COPD (chronic obstructive pulmonary disease) [facility])     Patient Active Problem List   Diagnosis Date Noted  . Displacement of lumbar intervertebral disc without myelopathy 04/07/2010  . LUMBAR SPRAIN AND STRAIN 12/04/2009    Past Surgical History:  Procedure Laterality Date  . ABDOMINAL HYSTERECTOMY    . TOE SURGERY Right    1st toe    OB History    Gravida  2   Para  2   Term  2   Preterm      AB      Living  1     SAB      IAB      Ectopic      Multiple      Live Births               Home Medications    Prior to Admission medications   Medication Sig Start Date End Date Taking? Authorizing Provider  levocetirizine (XYZAL) 5 MG tablet TAKE 1 TABLET BY MOUTH AT BEDTIME AS NEEDED FOR ALLERGIES 02/04/18  Yes [provider]  predniSONE (DELTASONE) 10 MG tablet 50 mg daily x 2 days, then 40 mg daily x 2 days, then 30 mg daily x 2 days, then 20 mg daily x 2 days, then 10 mg daily x 2 days. 07/23/20  Yes Natilee Gauer G, DO  brompheniramine (VAZOL) 2 MG/5ML LIQD Take 2 mg by mouth 2 (two) times daily.  07/23/20  [provider]  fluticasone (FLONASE) 50 MCG/ACT nasal spray Place 2 sprays into the nose  daily.  07/23/20  [provider]    Family History Family History  Problem Relation Age of Onset  . Cancer Mother   . Cancer Father     Social History Social History   Tobacco Use  . Smoking status: Never Smoker  . Smokeless tobacco: Never Used  Vaping Use  . Vaping Use: Never used  Substance Use Topics  . Alcohol use: No  . Drug use: No     Allergies   Patient has no known allergies.   Review of Systems Review of Systems  Musculoskeletal: Positive for arthralgias.   Physical Exam Triage Vital Signs ED Triage Vitals  Enc Vitals Group     BP 07/23/20 1054 131/78     Pulse Rate 07/23/20 1054 88     Resp 07/23/20 1054 18     Temp 07/23/20 1054 97.8 F (36.6 C)     Temp Source 07/23/20 1054 Oral     SpO2 07/23/20 1054 99 %     Weight 07/23/20 1055 130 lb (59 kg)     Height 07/23/20 1055  5\' 6"  (1.676 m)     Head Circumference --      Peak Flow --      Pain Score 07/23/20 1053 5     Pain Loc --      Pain Edu? --      Excl. in [place]? --    Updated Vital Signs BP 131/78 (BP Location: Left Arm)   Pulse 88   Temp 97.8 F (36.6 C) (Oral)   Resp 18   Ht 5\' 6"  (1.676 m)   Wt 59 kg   SpO2 99%   BMI 20.98 kg/m   Visual Acuity Right Eye Distance:   Left Eye Distance:   Bilateral Distance:    Right Eye Near:   Left Eye Near:    Bilateral Near:     Physical Exam Constitutional:      General: She is not in acute distress.    Appearance: Normal appearance. She is not ill-appearing.  HENT:     Head: Normocephalic and atraumatic.  Eyes:     General:        Right eye: No discharge.        Left eye: No discharge.     Conjunctiva/sclera: Conjunctivae normal.  Neck:     Comments: Patient with tenderness posteriorly. Cardiovascular:     Rate and Rhythm: Normal rate and regular rhythm.  Pulmonary:     Effort: Pulmonary effort is normal. No respiratory distress.  Musculoskeletal:     Comments: Left shoulder -decreased range of motion secondary to  pain.  Left knee -no apparent effusion.  Nontender.  Neurological:     Mental Status: She is alert.  Psychiatric:        Mood and Affect: Mood normal.        Behavior: Behavior normal.    UC Treatments / Results  Labs (all labs ordered are listed, but only abnormal results are displayed) Labs Reviewed - No data to display  EKG   Radiology No results found.  Procedures Procedures (including critical care time)  Medications Ordered in UC Medications - No data to display  Initial Impression / Assessment and Plan / UC Course  I have reviewed the triage vital signs and the nursing notes.  Pertinent labs & imaging results that were available during my care of the patient were reviewed by me and considered in my medical decision making (see chart for details).    75 year old female presents with polyarthralgia.  Placing on prednisone.  Advised that she needs to establish with primary care physician.  Final Clinical Impressions(s) / UC Diagnoses   Final diagnoses:  Polyarthralgia     Discharge Instructions     Please get yourself a primary care physician - Try [facility], [facility], or [facility].  Medication as prescribed.  Take care  Dr. Lacinda Axon    ED Prescriptions    Medication Sig Dispense Auth. Provider   predniSONE (DELTASONE) 10 MG tablet 50 mg daily x 2 days, then 40 mg daily x 2 days, then 30 mg daily x 2 days, then 20 mg daily x 2 days, then 10 mg daily x 2 days. 30 tablet Coral Spikes, DO     PDMP not reviewed this encounter.   Coral Spikes, DO 07/23/20 1308

## 2020-10-07 ENCOUNTER — Encounter: Payer: Self-pay | Admitting: Emergency Medicine

## 2020-10-07 ENCOUNTER — Other Ambulatory Visit: Payer: Self-pay

## 2020-10-07 ENCOUNTER — Ambulatory Visit
Admission: EM | Admit: 2020-10-07 | Discharge: 2020-10-07 | Disposition: A | Discharge status: Home / Self Care | Payer: BC Managed Care – PPO | Attending: Family Medicine | Admitting: Family Medicine

## 2020-10-07 DIAGNOSIS — R251 Tremor, unspecified: Secondary | ICD-10-CM

## 2020-10-07 DIAGNOSIS — R531 Weakness: Secondary | ICD-10-CM

## 2020-10-07 DIAGNOSIS — M2550 Pain in unspecified joint: Secondary | ICD-10-CM

## 2020-10-07 MED ORDER — TIZANIDINE HCL 4 MG PO TABS: 4.0000 mg | ORAL_TABLET | Freq: Every day | ORAL | 0 refills | Status: DC

## 2020-10-07 MED ORDER — MELOXICAM 7.5 MG PO TABS: 7.5000 mg | ORAL_TABLET | Freq: Every day | ORAL | 0 refills | Status: DC

## 2020-10-07 NOTE — ED Provider Notes (Signed)
[facility]    CSN: [id number] Arrival date & time: 10/07/20  1022      History   Chief Complaint No chief complaint on file.   HPI Laurie Wallace is a 75 y.o. female.   HPI Patient with a history of degenerative lumbar and cervical disc disease, COPD, multijoint arthritis presents today with generalized body pain with limited range of motion involving her left shoulder, generalized tremors, and left-sided generalized weakness that has worsened over the course of the last year.  Patient has been without primary care since 2016.  Patient was seen at our  [place] care back in January placed on a prednisone taper to help with inflammatory related pain.  She was advised to follow-up with primary care provider but has been unable to do so. 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 She is constantly lifting , pushing, and on her feet for 8 hour shifts. BP is slightly elevated today. No history of hypertension. Denies any dizziness, HA, CP, nausea or vomiting. Past Medical History:  Diagnosis Date  . Allergy   . Arthritis   . COPD (chronic obstructive pulmonary disease) [facility])     Patient Active Problem List   Diagnosis Date Noted  . Displacement of lumbar intervertebral disc without myelopathy 04/07/2010  . LUMBAR SPRAIN AND STRAIN 12/04/2009    Past Surgical History:  Procedure Laterality Date  . ABDOMINAL HYSTERECTOMY    . TOE SURGERY Right    1st toe    OB History    Gravida  2   Para  2   Term  2   Preterm      AB      Living  1     SAB      IAB      Ectopic      Multiple      Live Births               Home Medications    Prior to Admission medications   Medication Sig Start Date End Date Taking? Authorizing Provider  meloxicam (MOBIC) 7.5 MG tablet Take 1 tablet (7.5 mg total) by mouth daily. 10/07/20  Yes Scot Jun, FNP  tiZANidine (ZANAFLEX) 4 MG tablet Take 1 tablet (4 mg total) by mouth at  bedtime. 10/07/20  Yes Scot Jun, FNP  levocetirizine (XYZAL) 5 MG tablet TAKE 1 TABLET BY MOUTH AT BEDTIME AS NEEDED FOR ALLERGIES 02/04/18   [provider]  predniSONE (DELTASONE) 10 MG tablet 50 mg daily x 2 days, then 40 mg daily x 2 days, then 30 mg daily x 2 days, then 20 mg daily x 2 days, then 10 mg daily x 2 days. 07/23/20   Coral Spikes, DO  brompheniramine (VAZOL) 2 MG/5ML LIQD Take 2 mg by mouth 2 (two) times daily.  07/23/20  [provider]  fluticasone (FLONASE) 50 MCG/ACT nasal spray Place 2 sprays into the nose daily.  07/23/20  [provider]    Family History Family History  Problem Relation Age of Onset  . Cancer Mother   . Cancer Father     Social History Social History   Tobacco Use  . Smoking status: Never Smoker  . Smokeless tobacco: Never Used  Vaping Use  . Vaping Use: Never used  Substance Use Topics  . Alcohol use: No  . Drug use: No     Allergies   Patient has no known allergies.   Review  of Systems Review of Systems .Pertinent negatives listed in HPI   Physical Exam Triage Vital Signs ED Triage Vitals [10/07/20 1033]  Enc Vitals Group     BP (!) 176/76     Pulse Rate 86     Resp 18     Temp 97.7 F (36.5 C)     Temp Source Oral     SpO2 96 %     Weight      Height      Head Circumference      Peak Flow      Pain Score 10     Pain Loc      Pain Edu?      Excl. in [place]?    No data found.  Updated Vital Signs BP (!) 176/76 (BP Location: Right Arm)   Pulse 86   Temp 97.7 F (36.5 C) (Oral)   Resp 18   SpO2 96%   Visual Acuity Right Eye Distance:   Left Eye Distance:   Bilateral Distance:    Right Eye Near:   Left Eye Near:    Bilateral Near:     Physical Exam Constitutional:      Appearance: She is well-groomed.     Comments: Chronically-ill appearing   Neck:     Comments: Palpable cervical spinous tenderness. No obvious deformity noted Cardiovascular:     Rate and Rhythm: Normal  rate and regular rhythm.  Musculoskeletal:     Cervical back: Normal range of motion. Tenderness present.  Skin:    Capillary Refill: Capillary refill takes less than 2 seconds.  Neurological:     Mental Status: She is alert.     GCS: GCS eye subscore is 4. GCS verbal subscore is 5. GCS motor subscore is 6.     Cranial Nerves: No facial asymmetry.     Motor: Weakness and tremor present.     Comments: R hand grip 5/5 r/t L hand grip 4/5 Generalized tremor present with activity  Antalgic gait present   Psychiatric:        Behavior: Behavior is cooperative.      UC Treatments / Results  Labs (all labs ordered are listed, but only abnormal results are displayed) Labs Reviewed - No data to display  EKG   Radiology No results found.  Procedures Procedures (including critical care time)  Medications Ordered in UC Medications - No data to display  Initial Impression / Assessment and Plan / UC Course  I have reviewed the triage vital signs and the nursing notes.  Pertinent labs & imaging results that were available during my care of the patient were reviewed by me and considered in my medical decision making (see chart for details).     Patient presents with complaint of generalized pain affecting multiple joints of her body along with left-sided weakness and generalized tremors.  Patient's been lost to follow-up with primary care since 2016.  Referral placed for patient to be evaluated by neurology given chronic tremors and visible left-sided weakness with weakened grip strengths which has been progressively worsening over the course of 1 year.  Patient referred to primary care and is already scheduled to see Dr. Posey Pronto on 10/25/20.  For pain, will prescribe tizanidine 4 mg at bedtime as needed.  Daily arthritic pain meloxicam 7.5 mg daily.  Patient advised to keep both appointments with primary care and neurology.  ER precautions given.  Patient given a note to remain out of work for  the next 51  hours.  Final Clinical Impressions(s) / UC Diagnoses   Final diagnoses:  Left-sided weakness, > 1 year  Coarse tremors  Polyarthralgia   Discharge Instructions   None    ED Prescriptions    Medication Sig Dispense Auth. Provider   tiZANidine (ZANAFLEX) 4 MG tablet Take 1 tablet (4 mg total) by mouth at bedtime. 30 tablet Scot Jun, FNP   meloxicam (MOBIC) 7.5 MG tablet Take 1 tablet (7.5 mg total) by mouth daily. 90 tablet Scot Jun, FNP     PDMP not reviewed this encounter.   Scot Jun, FNP 10/07/20 1200

## 2020-10-07 NOTE — ED Triage Notes (Signed)
Body aches and pains all over states she can't pick up things.  States she has been seen by her doctor for same pains.

## 2020-10-17 ENCOUNTER — Encounter: Payer: Self-pay | Admitting: Neurology

## 2020-10-17 ENCOUNTER — Ambulatory Visit: Payer: BC Managed Care – PPO | Admitting: Neurology

## 2020-10-17 ENCOUNTER — Other Ambulatory Visit: Payer: Self-pay

## 2020-10-17 VITALS — BP 124/68 | HR 83 | Ht 66.0 in | Wt 112.8 lb

## 2020-10-17 DIAGNOSIS — M2550 Pain in unspecified joint: Secondary | ICD-10-CM

## 2020-10-17 NOTE — Progress Notes (Signed)
Reason for visit: Polyarthralgias  Referring physician: Dr. Metro Kung is a 75 y.o. female  History of present illness:  Laurie Wallace is a 75 year old right-handed black female with a history of onset of multiple joint pains that began in August or September 2021, but have significantly worsened by the end of December 2021.  Within the last 4 to 6 weeks, she has had a significant drop in appetite and she has lost about 20 pounds.  She reports pain in the neck and low back, she has had pain in the shoulders with restriction of movement of the shoulders bilaterally and discomfort into the elbows, wrists, and fingers.  She has pain in the knees and hips bilaterally.  She reports no numbness of extremities, she has not had any balance issues.  She reports that the pain is worse when she first starts moving in the morning, it tends to get better if she stays active throughout the day.  The patient is having difficulty sleeping at night because of some discomfort.  She is working doing custodial work and is having a lot of trouble with bending and stooping and lifting.  She was given a trial on a prednisone taper which helped her pain, but the pain came back when she came off the medication.  The use of low-dose Mobic and tizanidine was not helpful.  The patient reports no joint warmth or swelling, she has not had any headaches or vision changes or changes in memory.  She denies issues controlling the bowels or the bladder.  At times, even the muscles in her thighs are somewhat sore.  She denies any night sweats.  She is sent to this office for further evaluation.  She has reported some tremors off and on with arms and legs, somewhat worse on the left.  Past Medical History:  Diagnosis Date  . Allergy   . Arthritis   . COPD (chronic obstructive pulmonary disease) ([place])     Past Surgical History:  Procedure Laterality Date  . ABDOMINAL HYSTERECTOMY    . TOE SURGERY Right     1st toe    Family History  Problem Relation Age of Onset  . Cancer Mother   . Cancer Father     Social history:  reports that she has never smoked. She has never used smokeless tobacco. She reports that she does not drink alcohol and does not use drugs.  Medications:  Prior to Admission medications   Medication Sig Start Date End Date Taking? Authorizing Provider  levocetirizine (XYZAL) 5 MG tablet TAKE 1 TABLET BY MOUTH AT BEDTIME AS NEEDED FOR ALLERGIES 02/04/18  Yes [provider]  predniSONE (DELTASONE) 10 MG tablet 50 mg daily x 2 days, then 40 mg daily x 2 days, then 30 mg daily x 2 days, then 20 mg daily x 2 days, then 10 mg daily x 2 days. 07/23/20  Yes Cook, Jayce G, DO  meloxicam (MOBIC) 7.5 MG tablet Take 1 tablet (7.5 mg total) by mouth daily. 10/07/20   Scot Jun, FNP  tiZANidine (ZANAFLEX) 4 MG tablet Take 1 tablet (4 mg total) by mouth at bedtime. 10/07/20   Scot Jun, FNP  brompheniramine (VAZOL) 2 MG/5ML LIQD Take 2 mg by mouth 2 (two) times daily.  07/23/20  [provider]  fluticasone (FLONASE) 50 MCG/ACT nasal spray Place 2 sprays into the nose daily.  07/23/20  [provider]     No Known Allergies  ROS:  Out of a complete 14 system review of symptoms, the patient complains only of the following symptoms, and all other reviewed systems are negative.  Weight loss Joint pain, aching muscles Allergies, runny nose  Blood pressure 124/68, pulse 83, height 5\' 6"  (1.676 m), weight 112 lb 12.8 oz (51.2 kg).  Physical Exam  General: The patient is alert and cooperative at the time of the examination.  Eyes: Pupils are equal, round, and reactive to light. Discs are flat bilaterally.  Neck: The neck is supple, no carotid bruits are noted.  Respiratory: The respiratory examination is clear.  Cardiovascular: The cardiovascular examination reveals a regular rate and rhythm, no obvious murmurs or rubs are  noted.  Neuromuscular: The patient lacks about 15 to 20 degrees of full lateral rotation of the cervical spine.  The patient is able to elevate the arms to about 90 degrees.  The patient is able to flex the low back to about 90 degrees.  Skin: Extremities are without significant edema.  Neurologic Exam  Mental status: The patient is alert and oriented x 3 at the time of the examination. The patient has apparent normal recent and remote memory, with an apparently normal attention span and concentration ability.  Cranial nerves: Facial symmetry is present. There is good sensation of the face to pinprick and soft touch bilaterally. The strength of the facial muscles and the muscles to head turning and shoulder shrug are normal bilaterally. Speech is well enunciated, no aphasia or dysarthria is noted. Extraocular movements are full. Visual fields are full. The tongue is midline, and the patient has symmetric elevation of the soft palate. No obvious hearing deficits are noted.  Motor: The motor testing reveals 5 over 5 strength of all 4 extremities. Good symmetric motor tone is noted throughout.  Sensory: Sensory testing is intact to pinprick, soft touch, vibration sensation, and position sense on all 4 extremities. No evidence of extinction is noted.  Coordination: Cerebellar testing reveals good finger-nose-finger and heel-to-shin bilaterally.  Occasional transient fine action tremors are seen in both arms, left greater than right.  Gait and station: Gait is normal. Tandem gait is normal. Romberg is negative. No drift is seen.  Reflexes: Deep tendon reflexes are symmetric and normal bilaterally.  The ankle jerk reflexes are well-maintained bilaterally.  Toes are downgoing bilaterally.   Assessment/Plan:  1.  Polyarthralgias  The patient appears to primarily be presenting with a rheumatologic issue.  The patient will be sent for blood work today, if the blood work is unrevealing, I will  consider referral to a rheumatologist.  The patient was given a note to prevent her from heavy lifting and frequent bending and stooping.  She will be getting an FMLA form to be filled out.   Jill Alexanders MD 10/17/2020 10:57 AM  [facility] [address] [place], [place] [address]  Phone [phone] Fax [phone]

## 2020-10-18 LAB — PAN-ANCA
Atypical pANCA: <1:20 {titer}
P-ANCA: <1:20 {titer}

## 2020-10-18 LAB — CBC WITH DIFFERENTIAL/PLATELET
MCV: 87 fL (ref 79–97)
Neutrophils: 76 %

## 2020-10-18 LAB — COMPREHENSIVE METABOLIC PANEL: CO2: 22 mmol/L (ref 20–29)

## 2020-10-19 LAB — COMPREHENSIVE METABOLIC PANEL
BUN/Creatinine Ratio: 19 (ref 12–28)
Globulin, Total: 3.3 g/dL (ref 1.5–4.5)
Total Protein: 7.2 g/dL (ref 6.0–8.5)
eGFR: 97 mL/min/{1.73_m2} (ref >59)

## 2020-10-19 LAB — CBC WITH DIFFERENTIAL/PLATELET
Basos: 0 %
Immature Grans (Abs): 0.1 10*3/uL (ref 0.0–0.1)
Lymphs: 17 %
RBC: 4.16 x10E6/uL (ref 3.77–5.28)

## 2020-10-19 LAB — B. BURGDORFI ANTIBODIES

## 2020-10-19 LAB — CK: Total CK: 55 U/L (ref 32–182)

## 2020-10-19 LAB — ANGIOTENSIN CONVERTING ENZYME: Angio Convert Enzyme: 36 U/L (ref 14–82)

## 2020-10-20 LAB — GLIADIN ANTIBODIES, SERUM: Antigliadin Abs, IgA: 14 units (ref 0–19)

## 2020-10-20 LAB — ANA W/REFLEX: Anti Nuclear Antibody (ANA): NEGATIVE

## 2020-10-20 LAB — COMPREHENSIVE METABOLIC PANEL: Alkaline Phosphatase: 126 IU/L — ABNORMAL HIGH (ref 44–121)

## 2020-10-23 DIAGNOSIS — M542 Cervicalgia: Secondary | ICD-10-CM | POA: Insufficient documentation

## 2020-10-23 DIAGNOSIS — M545 Low back pain, unspecified: Secondary | ICD-10-CM | POA: Insufficient documentation

## 2020-10-24 ENCOUNTER — Telehealth: Payer: Self-pay | Admitting: Neurology

## 2020-10-24 LAB — CBC WITH DIFFERENTIAL/PLATELET
Basophils Absolute: 0 10*3/uL (ref 0.0–0.2)
EOS (ABSOLUTE): 0.3 10*3/uL (ref 0.0–0.4)
Eos: 2 %
Hematocrit: 36 % (ref 34.0–46.6)
Hemoglobin: 12.2 g/dL (ref 11.1–15.9)
Immature Granulocytes: 1 %
Lymphocytes Absolute: 1.8 10*3/uL (ref 0.7–3.1)
MCH: 29.3 pg (ref 26.6–33.0)
MCHC: 33.9 g/dL (ref 31.5–35.7)
Monocytes Absolute: 0.4 10*3/uL (ref 0.1–0.9)
Monocytes: 4 %
Neutrophils Absolute: 8.1 10*3/uL — ABNORMAL HIGH (ref 1.4–7.0)
Platelets: 495 10*3/uL — ABNORMAL HIGH (ref 150–450)
RDW: 15.4 % (ref 11.7–15.4)
WBC: 10.6 10*3/uL (ref 3.4–10.8)

## 2020-10-24 LAB — LYME, WESTERN BLOT, SERUM (REFLEXED)
IgG P18 Ab.: ABSENT
IgG P23 Ab.: ABSENT
IgG P28 Ab.: ABSENT
IgG P30 Ab.: ABSENT
IgG P39 Ab.: ABSENT
IgG P41 Ab.: ABSENT
IgG P45 Ab.: ABSENT
IgG P58 Ab.: ABSENT
IgG P66 Ab.: ABSENT
IgG P93 Ab.: ABSENT
IgM P23 Ab.: ABSENT
IgM P39 Ab.: ABSENT
IgM P41 Ab.: ABSENT
Lyme IgG Wb: NEGATIVE
Lyme IgM Wb: NEGATIVE

## 2020-10-24 LAB — COMPREHENSIVE METABOLIC PANEL
ALT: 5 IU/L (ref 0–32)
AST: 8 IU/L (ref 0–40)
Albumin/Globulin Ratio: 1.2 (ref 1.2–2.2)
Albumin: 3.9 g/dL (ref 3.7–4.7)
BUN: 10 mg/dL (ref 8–27)
Bilirubin Total: <0.2 mg/dL (ref 0.0–1.2)
Calcium: 9.3 mg/dL (ref 8.7–10.3)
Chloride: 101 mmol/L (ref 96–106)
Creatinine, Ser: 0.54 mg/dL — ABNORMAL LOW (ref 0.57–1.00)
Glucose: 100 mg/dL — ABNORMAL HIGH (ref 65–99)
Potassium: 4.4 mmol/L (ref 3.5–5.2)
Sodium: 140 mmol/L (ref 134–144)

## 2020-10-24 LAB — SEDIMENTATION RATE: Sed Rate: 68 mm/hr — ABNORMAL HIGH (ref 0–40)

## 2020-10-24 LAB — TSH: TSH: 1.95 u[IU]/mL (ref 0.450–4.500)

## 2020-10-24 LAB — C-REACTIVE PROTEIN: CRP: 25 mg/L — ABNORMAL HIGH (ref 0–10)

## 2020-10-24 LAB — PAN-ANCA
ANCA Proteinase 3: <3.5 U/mL (ref 0.0–3.5)
C-ANCA: <1:20 {titer}
Myeloperoxidase Ab: <9 U/mL (ref 0.0–9.0)

## 2020-10-24 LAB — RHEUMATOID FACTOR: Rhuematoid fact SerPl-aCnc: <10 IU/mL (ref <14.0)

## 2020-10-24 LAB — GLIADIN ANTIBODIES, SERUM: Gliadin IgG: 1 units (ref 0–19)

## 2020-10-24 LAB — GLUTAMIC ACID DECARBOXYLASE AUTO ABS: Glutamic Acid Decarb Ab: <5 U/mL (ref 0.0–5.0)

## 2020-10-24 NOTE — Telephone Encounter (Signed)
Pt is asking for a call with the results to her bloodwork. Pt would like to pick up a copy of the blood work results if available, please call(Pt aware Dr Jannifer Franklin is not in the office this week, also the purpose of a DPR was exlained to the niece of pt)

## 2020-10-25 ENCOUNTER — Telehealth: Payer: Self-pay | Admitting: Neurology

## 2020-10-25 ENCOUNTER — Encounter: Payer: Self-pay | Admitting: Internal Medicine

## 2020-10-25 ENCOUNTER — Ambulatory Visit: Payer: BC Managed Care – PPO | Admitting: Internal Medicine

## 2020-10-25 ENCOUNTER — Other Ambulatory Visit: Payer: Self-pay

## 2020-10-25 VITALS — BP 127/73 | HR 75 | Temp 98.0°F | Resp 18 | Ht 66.0 in | Wt 112.1 lb

## 2020-10-25 DIAGNOSIS — M130 Polyarthritis, unspecified: Secondary | ICD-10-CM

## 2020-10-25 DIAGNOSIS — Z1231 Encounter for screening mammogram for malignant neoplasm of breast: Secondary | ICD-10-CM

## 2020-10-25 DIAGNOSIS — Z1211 Encounter for screening for malignant neoplasm of colon: Secondary | ICD-10-CM

## 2020-10-25 DIAGNOSIS — M059 Rheumatoid arthritis with rheumatoid factor, unspecified: Secondary | ICD-10-CM | POA: Insufficient documentation

## 2020-10-25 DIAGNOSIS — E440 Moderate protein-calorie malnutrition: Secondary | ICD-10-CM | POA: Diagnosis not present

## 2020-10-25 DIAGNOSIS — Z7689 Persons encountering health services in other specified circumstances: Secondary | ICD-10-CM | POA: Diagnosis not present

## 2020-10-25 DIAGNOSIS — Z780 Asymptomatic menopausal state: Secondary | ICD-10-CM

## 2020-10-25 DIAGNOSIS — M797 Fibromyalgia: Secondary | ICD-10-CM | POA: Diagnosis not present

## 2020-10-25 DIAGNOSIS — Z2821 Immunization not carried out because of patient refusal: Secondary | ICD-10-CM

## 2020-10-25 DIAGNOSIS — R634 Abnormal weight loss: Secondary | ICD-10-CM | POA: Insufficient documentation

## 2020-10-25 MED ORDER — DULOXETINE HCL 30 MG PO CPEP: 30.0000 mg | ORAL_CAPSULE | Freq: Every day | ORAL | 1 refills | Status: DC

## 2020-10-25 NOTE — Telephone Encounter (Signed)
I called the patient.  The blood work was relatively unremarkable with exception that the inflammatory markers were elevated, sed rate and C-reactive protein, platelet levels were all elevated.  Slightly elevated alkaline phosphatase level.  No source of polyarthralgias noted.  Would recommend rheumatology consultation, if the patient is amenable to this she is to contact our office.

## 2020-10-25 NOTE — Patient Instructions (Signed)
Please start taking Duloxetine as prescribed. Okay to take Tylenol up to 2 times in a day.  Please avoid any heavy lifting more than 15 lbs.  Please start taking ensure supplements to help with protein intake. Do not skip any meals. Please maintain adequate hydration by taking at least 64 ounces of fluid in a day.

## 2020-10-25 NOTE — Assessment & Plan Note (Signed)
Multiple joint pain, discussed possible etiologies - most likely being OA Advised to avoid heavy duty and long shifts Avoid heavy lifting Referred to Rheumatology for further assessment as patient and her family concerned about CRP. Had discussion about nonspecificity of CRP.

## 2020-10-25 NOTE — Assessment & Plan Note (Signed)
Care established Previous chart reviewed History and medications reviewed with the patient 

## 2020-10-25 NOTE — Assessment & Plan Note (Signed)
Will perform age-appropriate cancer screening Improve food intake, maintain at least 3 meals in a day Increase fluid intake

## 2020-10-25 NOTE — Assessment & Plan Note (Signed)
Advised to avoid skipping any meal Ensure supplements Increase fluid intake for proper hydration Avoid over-exertion at work 

## 2020-10-25 NOTE — Progress Notes (Signed)
New Patient Office Visit  Subjective:  Patient ID: Laurie Wallace, female    DOB: June 09, 1946  Age: 75 y.o. MRN: [id number]  CC:  Chief Complaint  Patient presents with  . New Patient (Initial Visit)    New patient has been awhile since having a primary was seen in urgent care 10-07-20 she had pain down neck and back down knees they told her muscle spasms this is a little better     HPI Laurie Wallace is a 75 year old female with past medical history of arthritis who presents for establishing care.  She has not had any PCP for a long time. Her granddaughter was present during the visit.  She recently had urgent care visit for diffuse joint pain and muscle aches.  She complains of bilateral shoulder, elbow, hand, low back and knee pain, which is worse for last 2 months.  She has tried taking Tylenol, which helped with the pain for few hours.  She saw a neurologist for evaluation of diffuse muscle aches.  Her recent blood test showed elevated CRP, which is nonspecific in this setting.  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  She also reports recent weight loss of about 20 pounds.  She admits that she skips meals and does not like to drink water.  She denies any lumps or bumps, fatigue, fever, night sweats, dyspnea, melena or hematochezia.  She is willing to get Cologuard for colon cancer screening.  She denies to take any vaccine.  Past Medical History:  Diagnosis Date  . Allergy   . Arthritis   . COPD (chronic obstructive pulmonary disease) ([place])     Past Surgical History:  Procedure Laterality Date  . ABDOMINAL HYSTERECTOMY    . TOE SURGERY Right    1st toe    Family History  Problem Relation Age of Onset  . Cancer Mother   . Cancer Father     Social History   Socioeconomic History  . Marital status: Single    Spouse name: Not on file  . Number of children:  Not on file  . Years of education: Not on file  . Highest education level: Not on file  Occupational History  . Occupation: full time  Tobacco Use  . Smoking status: Never Smoker  . Smokeless tobacco: Never Used  Vaping Use  . Vaping Use: Never used  Substance and Sexual Activity  . Alcohol use: No  . Drug use: No  . Sexual activity: Not on file  Other Topics Concern  . Not on file  Social History Narrative   Lives with her son   Right Handed   Drinks 1-2 cups caffeine daily in winter   Social Determinants of Health   Financial Resource Strain: Not on file  Food Insecurity: Not on file  Transportation Needs: Not on file  Physical Activity: Not on file  Stress: Not on file  Social Connections: Not on file  Intimate Partner Violence: Not on file    ROS Review of Systems  Constitutional: Positive for unexpected weight change. Negative for chills and fever.  HENT: Negative for congestion, sinus pressure, sinus pain and sore throat.   Eyes: Negative for pain and discharge.  Respiratory: Negative for cough and shortness of breath.   Cardiovascular: Negative for chest pain and palpitations.  Gastrointestinal: Negative for abdominal pain, constipation, diarrhea, nausea and vomiting.  Endocrine: Negative  for polydipsia and polyuria.  Genitourinary: Negative for dysuria and hematuria.  Musculoskeletal: Positive for arthralgias, back pain and myalgias. Negative for neck pain and neck stiffness.  Skin: Negative for rash.  Neurological: Negative for dizziness and weakness.  Psychiatric/Behavioral: Negative for agitation and behavioral problems.    Objective:   Today's Vitals: BP 127/73 (BP Location: Right Arm, Patient Position: Sitting, Cuff Size: Normal)   Pulse 75   Temp 98 F (36.7 C) (Oral)   Resp 18   Ht 5\' 6"  (1.676 m)   Wt 112 lb 1.9 oz (50.9 kg)   SpO2 99%   BMI 18.10 kg/m   Physical Exam Vitals reviewed.  Constitutional:      General: She is not in acute  distress.    Appearance: She is not diaphoretic.  HENT:     Head: Normocephalic and atraumatic.     Nose: Nose normal.     Mouth/Throat:     Mouth: Mucous membranes are moist.  Eyes:     General: No scleral icterus.    Extraocular Movements: Extraocular movements intact.     Pupils: Pupils are equal, round, and reactive to light.  Cardiovascular:     Rate and Rhythm: Normal rate and regular rhythm.     Pulses: Normal pulses.     Heart sounds: Normal heart sounds. No murmur heard.   Pulmonary:     Breath sounds: Normal breath sounds. No wheezing or rales.  Abdominal:     Palpations: Abdomen is soft.     Tenderness: There is no abdominal tenderness.  Musculoskeletal:     Cervical back: Neck supple. No tenderness.     Right lower leg: No edema.     Left lower leg: No edema.  Skin:    General: Skin is warm.     Findings: No rash.  Neurological:     General: No focal deficit present.     Mental Status: She is alert and oriented to person, place, and time.  Psychiatric:        Mood and Affect: Mood normal.        Behavior: Behavior normal.     Assessment & Plan:   Problem List Items Addressed This Visit      Encounter to establish care - Primary   Care established Previous chart reviewed History and medications reviewed with the patient       Musculoskeletal and Integument   Polyarthritis    Multiple joint pain, discussed possible etiologies - most likely being OA Advised to avoid heavy duty and long shifts Avoid heavy lifting Referred to Rheumatology for further assessment as patient and her family concerned about CRP. Had discussion about nonspecificity of CRP.      Relevant Orders   Ambulatory referral to Rheumatology     Other   Weight loss    Will perform age-appropriate cancer screening Improve food intake, maintain at least 3 meals in a day Increase fluid intake            Fibromyalgia    Diffuse pain with tender points could be  fibromyalgia Started Cymbalta      Relevant Medications   DULoxetine (CYMBALTA) 30 MG capsule   Other Relevant Orders   Ambulatory referral to Rheumatology   Moderate protein-calorie malnutrition (HCC)    Advised to avoid skipping any meal Ensure supplements Increase fluid intake for proper hydration Avoid over-exertion at work       Other Visit Diagnoses    Screening mammogram for breast  cancer       Relevant Orders   MM 3D SCREEN BREAST BILATERAL   Post-menopausal       Relevant Orders   DG Bone Density   Special screening for malignant neoplasms, colon       Relevant Orders   Cologuard   COVID-19 vaccination refused       Refused influenza vaccine          Outpatient Encounter Medications as of 10/25/2020  Medication Sig  . DULoxetine (CYMBALTA) 30 MG capsule Take 1 capsule (30 mg total) by mouth daily.  Marland Kitchen levocetirizine (XYZAL) 5 MG tablet TAKE 1 TABLET BY MOUTH AT BEDTIME AS NEEDED FOR ALLERGIES (Patient not taking: Reported on 10/25/2020)  . [DISCONTINUED] brompheniramine (VAZOL) 2 MG/5ML LIQD Take 2 mg by mouth 2 (two) times daily.  . [DISCONTINUED] fluticasone (FLONASE) 50 MCG/ACT nasal spray Place 2 sprays into the nose daily.   No facility-administered encounter medications on file as of 10/25/2020.    Follow-up: Return in about 3 months (around 01/24/2021) for Weight check and fibromyalgia.   Lindell Spar, MD

## 2020-10-25 NOTE — Assessment & Plan Note (Signed)
Diffuse pain with tender points could be fibromyalgia Started Cymbalta

## 2020-10-28 ENCOUNTER — Ambulatory Visit (HOSPITAL_COMMUNITY)
Admission: RE | Admit: 2020-10-28 | Discharge: 2020-10-28 | Disposition: A | Discharge status: Home / Self Care | Payer: BC Managed Care – PPO | Source: Ambulatory Visit | Attending: Internal Medicine | Admitting: Internal Medicine

## 2020-10-28 ENCOUNTER — Other Ambulatory Visit: Payer: Self-pay

## 2020-10-28 DIAGNOSIS — Z780 Asymptomatic menopausal state: Secondary | ICD-10-CM

## 2020-10-28 DIAGNOSIS — Z1231 Encounter for screening mammogram for malignant neoplasm of breast: Secondary | ICD-10-CM | POA: Insufficient documentation

## 2020-10-29 ENCOUNTER — Other Ambulatory Visit (HOSPITAL_COMMUNITY): Payer: Self-pay | Admitting: Internal Medicine

## 2020-10-29 ENCOUNTER — Other Ambulatory Visit: Payer: Self-pay | Admitting: Internal Medicine

## 2020-10-29 DIAGNOSIS — R928 Other abnormal and inconclusive findings on diagnostic imaging of breast: Secondary | ICD-10-CM

## 2020-10-29 DIAGNOSIS — M810 Age-related osteoporosis without current pathological fracture: Secondary | ICD-10-CM

## 2020-10-29 MED ORDER — ALENDRONATE SODIUM 70 MG PO TABS
70.0000 mg | ORAL_TABLET | ORAL | 11 refills | Status: DC
Start: 2020-10-29 — End: 2022-01-14

## 2020-10-29 MED ORDER — CALTRATE 600+D PLUS MINERALS 600-800 MG-UNIT PO CHEW: 1.0000 | CHEWABLE_TABLET | Freq: Every day | ORAL | 5 refills | Status: DC

## 2020-10-30 ENCOUNTER — Telehealth: Payer: Self-pay | Admitting: *Deleted

## 2020-10-30 NOTE — Telephone Encounter (Signed)
Ann on [employer] called wanting to know why pt was not taken out of work just a letter that says seen here. Attempted to call her back and let her know we could not provide dates for work as she was just saw for a new patient and we never took her out of work prior to her coming here. We also did not take her out of work when she was seen. LVM to have her call the office back

## 2020-11-05 ENCOUNTER — Other Ambulatory Visit: Payer: Self-pay

## 2020-11-05 ENCOUNTER — Ambulatory Visit (HOSPITAL_COMMUNITY)
Admission: RE | Admit: 2020-11-05 | Discharge: 2020-11-05 | Disposition: A | Discharge status: Home / Self Care | Payer: BC Managed Care – PPO | Source: Ambulatory Visit | Attending: Internal Medicine | Admitting: Internal Medicine

## 2020-11-05 DIAGNOSIS — R928 Other abnormal and inconclusive findings on diagnostic imaging of breast: Secondary | ICD-10-CM

## 2020-11-07 ENCOUNTER — Other Ambulatory Visit: Payer: Self-pay

## 2020-11-07 ENCOUNTER — Ambulatory Visit: Payer: BC Managed Care – PPO | Admitting: Internal Medicine

## 2020-11-07 ENCOUNTER — Telehealth: Payer: Self-pay

## 2020-11-07 ENCOUNTER — Encounter: Payer: Self-pay | Admitting: Internal Medicine

## 2020-11-07 DIAGNOSIS — M130 Polyarthritis, unspecified: Secondary | ICD-10-CM | POA: Diagnosis not present

## 2020-11-07 MED ORDER — PREDNISONE 20 MG PO TABS: 40.0000 mg | ORAL_TABLET | Freq: Every day | ORAL | 0 refills | Status: DC

## 2020-11-07 NOTE — Progress Notes (Signed)
Virtual Visit via Telephone Note   This visit type was conducted due to national recommendations for restrictions regarding the COVID-19 Pandemic (e.g. social distancing) in an effort to limit this patient's exposure and mitigate transmission in our community.  Due to her co-morbid illnesses, this patient is at least at moderate risk for complications without adequate follow up.  This format is felt to be most appropriate for this patient at this time.  The patient did not have access to video technology/had technical difficulties with video requiring transitioning to audio format only (telephone).  All issues noted in this document were discussed and addressed.  No physical exam could be performed with this format.  Evaluation Performed:  Follow-up visit  Date:  11/07/2020   ID:  Laurie Wallace, DOB 1946/01/17, MRN [id number]  Patient Location: Home Provider Location: Office/Clinic  Participants: Patient Location of Patient: Home Location of Provider: Telehealth Consent was obtain for visit to be over via telehealth. I verified that I am speaking with the correct person using two identifiers.  PCP:  Lindell Spar, MD   Chief Complaint:  Joint pains  History of Present Illness:    Laurie Wallace is a 75 y.o. female who has a televisit for c/o b/l hands and hip pain. She has been taking Tylenol PRN. She also has started taking Cymbalta, but she still has multiple joint pain. She denies any recent injury.  The patient does not have symptoms concerning for COVID-19 infection (fever, chills, cough, or new shortness of breath).   Past Medical, Surgical, Social History, Allergies, and Medications have been Reviewed.  Past Medical History:  Diagnosis Date  . Allergy   . Arthritis   . COPD (chronic obstructive pulmonary disease) ([place])    Past Surgical History:  Procedure Laterality Date  . ABDOMINAL HYSTERECTOMY    . TOE SURGERY Right    1st toe     Current Meds   Medication Sig  . alendronate (FOSAMAX) 70 MG tablet Take 1 tablet (70 mg total) by mouth every 7 (seven) days. Take with a full glass of water on an empty stomach.  . Calcium Carbonate-Vit D-Min (CALTRATE 600+D PLUS MINERALS) 600-800 MG-UNIT CHEW Chew 1 tablet by mouth daily.  . DULoxetine (CYMBALTA) 30 MG capsule Take 1 capsule (30 mg total) by mouth daily.  Marland Kitchen levocetirizine (XYZAL) 5 MG tablet   . predniSONE (DELTASONE) 20 MG tablet Take 2 tablets (40 mg total) by mouth daily with breakfast.     Allergies:   Patient has no known allergies.   ROS:   Please see the history of present illness.     All other systems reviewed and are negative.   Labs/Other Tests and Data Reviewed:    Recent Labs: 10/17/2020: ALT 5; BUN 10; Creatinine, Ser 0.54; Hemoglobin 12.2; Platelets 495; Potassium 4.4; Sodium 140; TSH 1.950   Recent Lipid Panel No results found for: CHOL, TRIG, HDL, CHOLHDL, LDLCALC, LDLDIRECT  Wt Readings from Last 3 Encounters:  10/25/20 112 lb 1.9 oz (50.9 kg)  10/17/20 112 lb 12.8 oz (51.2 kg)  07/23/20 130 lb (59 kg)      ASSESSMENT & PLAN:    Polyarthritis Tried PRN Tylenol On Cymbalta Prednisone for short-term Awaiting Rheumatology referral  Time:   Today, I have spent 9 minutes reviewing the chart, including problem list, medications, and with the patient with telehealth technology discussing the above problems.   Medication Adjustments/Labs and Tests Ordered: Current medicines are reviewed at length  with the patient today.  Concerns regarding medicines are outlined above.   Tests Ordered: No orders of the defined types were placed in this encounter.   Medication Changes: Meds ordered this encounter  Medications  . predniSONE (DELTASONE) 20 MG tablet    Sig: Take 2 tablets (40 mg total) by mouth daily with breakfast.    Dispense:  10 tablet    Refill:  0     Note: This dictation was prepared with Dragon dictation along with smaller phrase  technology. Similar sounding words can be transcribed inadequately or may not be corrected upon review. Any transcriptional errors that result from this process are unintentional.      Disposition:  Follow up  Signed, Lindell Spar, MD  11/07/2020 2:33 PM     [place]

## 2020-11-07 NOTE — Telephone Encounter (Signed)
Pt will need phone visit to discuss with provider  

## 2020-11-07 NOTE — Telephone Encounter (Signed)
Patient called went back to work 4/24-4/28. On 5/1 did not go back to work and has not been back to work since 4/28 due to a lot of arm, hand and hip pain. Needs out of work note to be excused for dates 11/07/20 for about a week.  Patient call back # [phone].

## 2020-11-07 NOTE — Patient Instructions (Signed)
Please start taking Prednisone as prescribed.  Please avoid Ibuprofen, Naproxen or Aleve while taking Prednisone.  Please avoid heavy lifting and exertional activity.

## 2020-11-12 ENCOUNTER — Telehealth: Payer: Self-pay

## 2020-11-12 NOTE — Telephone Encounter (Signed)
FMLA   Noted Copied  Sleeved  

## 2020-11-16 ENCOUNTER — Other Ambulatory Visit: Payer: Self-pay | Admitting: Internal Medicine

## 2020-11-16 DIAGNOSIS — M797 Fibromyalgia: Secondary | ICD-10-CM

## 2020-11-18 DIAGNOSIS — Z0279 Encounter for issue of other medical certificate: Secondary | ICD-10-CM

## 2020-11-20 NOTE — Telephone Encounter (Signed)
Patient picked up forms.

## 2020-11-20 NOTE — Telephone Encounter (Signed)
Forms completed  Pt was notified, and will come by and pick up

## 2020-12-10 NOTE — Progress Notes (Signed)
 Office Visit Note  Patient: Laurie Wallace             Date of Birth: 07/27/1945           MRN: [id number]             PCP: Patel, Rutwik K, MD Referring: Patel, Rutwik K, MD Visit Date: 12/11/2020  Subjective:  New Patient (Initial Visit) (Patient complains of left upper extremity pain (shoulder, elbow, hand), left hip, and left knee pain. )   History of Present Illness: Laurie Wallace is a 74 y.o. female here for evaluation of joint pains in multiple areas with elevated inflammatory markers.  She believes these symptoms in her shoulder started since about 4 5 months ago without any specific preceding injury or event that she can recall.  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  She does not recall any past significant shoulder injuries or surgeries.  Previous treatments tried include cymbalta that she does not notice a significant improvement.  Since last month she was started on oral daily prednisone for the past few weeks and has noticed a significant improvement in her joint symptoms particularly resolution of the hand swelling although does continue having some left shoulder pain.  LAbs reviewed 10/2020 ANA neg ACE neg RF neg ANCAs neg ESR 68 CRP 25 Alk phos 126 Plts 495 CK 55   Activities of Daily Living:  Patient reports morning stiffness for 24 hours.   Patient Reports nocturnal pain.  Difficulty dressing/grooming: Reports Difficulty climbing stairs: Denies Difficulty getting out of chair: Denies Difficulty using hands for taps, buttons, cutlery, and/or writing: Reports  Review of Systems  Constitutional: Positive for fatigue.  HENT: Negative for mouth sores, mouth dryness and nose dryness.   Eyes: Negative for pain, itching, visual disturbance and dryness.  Respiratory: Negative for cough, hemoptysis, shortness of breath and difficulty breathing.    Cardiovascular: Negative for chest pain, palpitations and swelling in legs/feet.  Gastrointestinal: Positive for constipation. Negative for abdominal pain, blood in stool and diarrhea.  Endocrine: Negative for increased urination.  Genitourinary: Negative for painful urination.  Musculoskeletal: Positive for arthralgias, joint pain, joint swelling, myalgias, morning stiffness and myalgias. Negative for muscle weakness and muscle tenderness.  Skin: Negative for color change, rash and redness.  Allergic/Immunologic: Negative for susceptible to infections.  Neurological: Negative for dizziness, numbness, headaches, memory loss and weakness.  Hematological: Negative for swollen glands.  Psychiatric/Behavioral: Positive for sleep disturbance. Negative for confusion.    PMFS History:  Patient Active Problem List   Diagnosis Date Noted  . Pain in left shoulder 12/11/2020  . Pain in left hand 12/11/2020  . Weight loss 10/25/2020  . Encounter to establish care 10/25/2020  . Polyarthritis 10/25/2020  . Fibromyalgia 10/25/2020  . Moderate protein-calorie malnutrition (HCC) 10/25/2020  . Cervical spine pain 10/23/2020  . Lumbar pain 10/23/2020  . Other intervertebral disc displacement, lumbar region 04/07/2010  . LUMBAR SPRAIN AND STRAIN 12/04/2009    Past Medical History:  Diagnosis Date  . Allergy   . Arthritis   . COPD (chronic obstructive pulmonary disease) (HCC)     Family History  Problem Relation Age of Onset  . Cancer Mother   . Cancer Father   . Heart failure Sister   . COPD Sister   . Dementia Sister    Past Surgical History:  Procedure Laterality Date  . ABDOMINAL HYSTERECTOMY    .   TOE SURGERY Right    1st toe   Social History   Social History Narrative   Lives with her son   Right Handed   Drinks 1-2 cups caffeine daily in winter    There is no immunization history on file for this patient.   Objective: Vital Signs: BP 119/73 (BP Location: Right Arm,  Patient Position: Sitting, Cuff Size: Normal)   Pulse 84   Ht 5' 4" (1.626 m)   Wt 116 lb (52.6 kg)   BMI 19.91 kg/m    Physical Exam HENT:     Right Ear: External ear normal.     Left Ear: External ear normal.     Mouth/Throat:     Mouth: Mucous membranes are moist.     Pharynx: Oropharynx is clear.  Eyes:     Conjunctiva/sclera: Conjunctivae normal.  Cardiovascular:     Rate and Rhythm: Normal rate and regular rhythm.  Pulmonary:     Effort: Pulmonary effort is normal.     Breath sounds: Normal breath sounds.  Skin:    General: Skin is warm and dry.     Findings: No rash.  Neurological:     General: No focal deficit present.     Mental Status: She is alert.     Deep Tendon Reflexes: Reflexes normal.  Psychiatric:        Mood and Affect: Mood normal.     Musculoskeletal Exam:  Shoulders full ROM on right, left sided inability to abduct overhead on active ROM and resisted with strong guarding to passive ROM, tenderness to palpation anteriorly, laterally, and proximally Elbows full ROM no tenderness or swelling Wrists full ROM left wrist tenderness to palpation without swelling Fingers right thumb MCP enlargement and fixed hyperextension, 1st CMC joint squaring, heberdon's nodes of DIP joints bilaterally Knees full ROM, no swelling, patellofemoral crepitus present and joint line tenderness Ankles full ROM no tenderness or swelling MTPs no tenderness   Investigation: No additional findings.  Imaging: XR Hand 2 View Left  Result Date: 12/11/2020 X-ray left hand 2 views Radiocarpal joint space appears normal.  Mild first CMC joint degenerative arthritis.  MCP joint spaces appear normal.  Osteoarthritis changes in PIP and DIP joints with increased sclerosis and small lateral osteophyte formation mild joint space narrowing in DIPs.  Bone mineralization appears normal. Impression Mild first CMC and DIP joints osteoarthritis changes no specific inflammatory changes are  seen  XR Hand 2 View Right  Result Date: 12/11/2020 X-ray right hand 2 views Radiocarpal joint space appears normal.  Partial joint subluxation at first [facility] joint.  Bony enlargement partial subluxation and hyperextension position of first MCP joint.  MCP joint spaces appear normal.  PIP and DIP joint spaces appear normal with slightly increased sclerosis several joints.  Cortical thickness may indicate a generalized osteopenia. Impression Changes consistent with posttraumatic arthritis change of the right thumb otherwise minimal arthritis changes  XR Shoulder Left  Result Date: 12/11/2020 X-ray left shoulder 4 views Glenohumeral joint appears normal.  Normal internal and external rotation is seen.  AC joint is patent with no narrowing sclerosis or spurring.  No abnormal mineralization or evidence of fracture seen. Impression No significant degenerative or erosive arthritis changes are seen  XR Shoulder Right  Result Date: 12/11/2020 X-ray right shoulder 4 views Glenohumeral joint space appears normal.  Normal alignment with internal and external rotation.  AC joint narrowing with bone spurring on superior margin.  No abnormal mineralization or injury seen. Impression AC joint  osteoarthritis seen no erosive disease changes   Recent Labs: Lab Results  Component Value Date   WBC 10.6 10/17/2020   HGB 12.2 10/17/2020   PLT 495 (H) 10/17/2020   NA 140 10/17/2020   K 4.4 10/17/2020   CL 101 10/17/2020   CO2 22 10/17/2020   GLUCOSE 100 (H) 10/17/2020   BUN 10 10/17/2020   CREATININE 0.54 (L) 10/17/2020   BILITOT <0.2 10/17/2020   ALKPHOS 126 (H) 10/17/2020   AST 8 10/17/2020   ALT 5 10/17/2020   PROT 7.2 10/17/2020   ALBUMIN 3.9 10/17/2020   CALCIUM 9.3 10/17/2020   GFRAA 88 09/28/2014    Speciality Comments: No specialty comments available.  Procedures:  No procedures performed Allergies: Patient has no known allergies.   Assessment / Plan:     Visit Diagnoses: Polyarthritis -  Plan: predniSONE (DELTASONE) 10 MG tablet, Cyclic citrul peptide antibody, IgG, 14-3-3 eta Protein, IgG, IgA, IgM, Sedimentation rate, Uric acid  Symptoms present with significant upper extremity joint pain during the past few months I suspect the chronic lower extremity pain being more longstanding degenerative arthritis related.  Significantly high inflammatory markers the patient describes significant mount of swelling in at least the left hand currently she is taking prednisone which may be masking inflammatory changes on exam today.  We will check CCP antibody and _0 protein also checking sedimentation rate and uric acid.  We will check x-rays of bilateral shoulders and hands for any evidence of erosive disease or of arthritis to explain the shoulder symptoms.  If x-rays are unremarkable may simply reflect new onset disease.  She is very much not interested in trial of intra-articular steroid injections today says due to past bad experience with back injections for chronic back pain before.  Would consider trial of DMARD even if seronegative findings based on current labs and symptoms described.  Acute pain of left shoulder - Plan: XR Shoulder Left, XR Shoulder Right  Checking x-ray of bilateral shoulders for any evidence of acute osseous injury or if there is significant degenerative arthritis to explain the decreased range of motion and pain on exam.  Pain in left hand - Plan: XR Hand 2 View Right, XR Hand 2 View Left  Left hand pain without obvious visible changes or focal tenderness on exam obtaining bilateral hand x-rays for any evidence of erosive disease or advanced osteoarthritis.  Also for any evidence of chondrocalcinosis to suggest CPPD.  Orders: Orders Placed This Encounter  Procedures  . XR Shoulder Left  . XR Shoulder Right  . XR Hand 2 View Right  . XR Hand 2 View Left  . Cyclic citrul peptide antibody, IgG  . 14-3-3 eta Protein  . IgG, IgA, IgM  . Sedimentation rate  .  Uric acid   Meds ordered this encounter  Medications  . predniSONE (DELTASONE) 10 MG tablet    Sig: Take 2 tablets (20 mg total) by mouth daily with breakfast.    Dispense:  30 tablet    Refill:  0    Follow-Up Instructions: Return in about 2 weeks (around 12/25/2020) for New pt f/u labs, xrays, short term prednisone.   Collier Salina, MD  Note - This record has been created using [employer].  Chart creation errors have been sought, but may not always  have been located. Such creation errors do not reflect on  the standard of medical care.

## 2020-12-11 ENCOUNTER — Ambulatory Visit: Payer: Self-pay

## 2020-12-11 ENCOUNTER — Encounter: Payer: Self-pay | Admitting: Internal Medicine

## 2020-12-11 ENCOUNTER — Ambulatory Visit: Payer: BC Managed Care – PPO | Admitting: Internal Medicine

## 2020-12-11 ENCOUNTER — Other Ambulatory Visit: Payer: Self-pay

## 2020-12-11 VITALS — BP 119/73 | HR 84 | Ht 64.0 in | Wt 116.0 lb

## 2020-12-11 DIAGNOSIS — M79642 Pain in left hand: Secondary | ICD-10-CM

## 2020-12-11 DIAGNOSIS — M25512 Pain in left shoulder: Secondary | ICD-10-CM

## 2020-12-11 DIAGNOSIS — M25511 Pain in right shoulder: Secondary | ICD-10-CM

## 2020-12-11 DIAGNOSIS — M130 Polyarthritis, unspecified: Secondary | ICD-10-CM | POA: Diagnosis not present

## 2020-12-11 DIAGNOSIS — M79641 Pain in right hand: Secondary | ICD-10-CM | POA: Diagnosis not present

## 2020-12-11 MED ORDER — PREDNISONE 10 MG PO TABS: 20.0000 mg | ORAL_TABLET | Freq: Every day | ORAL | 0 refills | Status: DC

## 2020-12-15 NOTE — Progress Notes (Signed)
Blood test is positive for rheumatoid arthritis associated antibodies. Her inflammation markers are decreased, this is probably due to the prednisone treatment. This does look like rheumatoid arthritis and will discuss long term treatment options in our follow up visit.

## 2020-12-15 NOTE — Progress Notes (Signed)
There is no evidence of joint erosion or damage from the inflammatory arthritis, although she has existing osteoarthritis changes.

## 2020-12-16 LAB — SEDIMENTATION RATE: Sed Rate: 29 mm/h (ref 0–30)

## 2020-12-16 LAB — URIC ACID: Uric Acid, Serum: 5 mg/dL (ref 2.5–7.0)

## 2020-12-16 LAB — IGG, IGA, IGM
IgG (Immunoglobin G), Serum: 1189 mg/dL (ref 600–1540)
IgM, Serum: 443 mg/dL — ABNORMAL HIGH (ref 50–300)
Immunoglobulin A: 334 mg/dL — ABNORMAL HIGH (ref 70–320)

## 2020-12-16 LAB — CYCLIC CITRUL PEPTIDE ANTIBODY, IGG: Cyclic Citrullin Peptide Ab: 103 UNITS — ABNORMAL HIGH

## 2020-12-16 LAB — 14-3-3 ETA PROTEIN: 14-3-3 eta Protein: <0.2 ng/mL (ref <0.2)

## 2020-12-16 NOTE — Progress Notes (Signed)
Attempted to contact patient, LVM advising patient to call the office. 

## 2020-12-18 ENCOUNTER — Ambulatory Visit: Payer: BLUE CROSS/BLUE SHIELD | Admitting: Neurology

## 2020-12-26 ENCOUNTER — Encounter: Payer: Self-pay | Admitting: Internal Medicine

## 2020-12-26 ENCOUNTER — Other Ambulatory Visit: Payer: Self-pay

## 2020-12-26 ENCOUNTER — Ambulatory Visit
Admission: RE | Admit: 2020-12-26 | Discharge: 2020-12-26 | Disposition: A | Discharge status: Home / Self Care | Payer: Medicare Other | Source: Ambulatory Visit | Attending: Internal Medicine | Admitting: Internal Medicine

## 2020-12-26 ENCOUNTER — Ambulatory Visit: Payer: Medicare Other | Admitting: Internal Medicine

## 2020-12-26 VITALS — BP 120/71 | HR 80 | Ht 66.0 in | Wt 118.0 lb

## 2020-12-26 DIAGNOSIS — M25512 Pain in left shoulder: Secondary | ICD-10-CM | POA: Diagnosis not present

## 2020-12-26 DIAGNOSIS — Z79899 Other long term (current) drug therapy: Secondary | ICD-10-CM

## 2020-12-26 DIAGNOSIS — M79642 Pain in left hand: Secondary | ICD-10-CM | POA: Diagnosis not present

## 2020-12-26 DIAGNOSIS — M130 Polyarthritis, unspecified: Secondary | ICD-10-CM | POA: Diagnosis not present

## 2020-12-26 DIAGNOSIS — M059 Rheumatoid arthritis with rheumatoid factor, unspecified: Secondary | ICD-10-CM | POA: Diagnosis not present

## 2020-12-26 DIAGNOSIS — J849 Interstitial pulmonary disease, unspecified: Secondary | ICD-10-CM | POA: Diagnosis not present

## 2020-12-26 DIAGNOSIS — R918 Other nonspecific abnormal finding of lung field: Secondary | ICD-10-CM | POA: Diagnosis not present

## 2020-12-26 MED ORDER — PREDNISONE 10 MG PO TABS: 10.0000 mg | ORAL_TABLET | Freq: Every day | ORAL | 0 refills | Status: AC

## 2020-12-26 NOTE — Progress Notes (Signed)
Office Visit Note  Patient: Laurie Wallace             Date of Birth: 01/17/1946           MRN: [id number]             PCP: Lindell Spar, MD Referring: Lindell Spar, MD Visit Date: 12/26/2020   Subjective:  Follow-up (Patient complains of continued left upper extremity pain, especially the left hand, as well as occasional left hip and left knee pain. Patient has tapered Prednisone 10 mg daily. )   History of Present Illness: Laurie Wallace is a 75 y.o. female here for follow up for multiple joint pains with positive rheumatoid arthritis antibodies at previous new patient visit. She did not have elevated sedimentation rate or visible joint synovitis at that visit although on recent prednisone treatment.  She reports since the last visit most of her symptoms remain well controlled on the 10 mg prednisone she is only having increased problems at the left shoulder and left hand.   Review of Systems  Constitutional:  Negative for fatigue.  HENT:  Negative for mouth sores, mouth dryness and nose dryness.   Eyes:  Negative for pain, itching, visual disturbance and dryness.  Respiratory:  Negative for cough, hemoptysis, shortness of breath and difficulty breathing.   Cardiovascular:  Negative for chest pain, palpitations and swelling in legs/feet.  Gastrointestinal:  Negative for abdominal pain, blood in stool, constipation and diarrhea.  Endocrine: Negative for increased urination.  Genitourinary:  Negative for painful urination.  Musculoskeletal:  Positive for joint pain, joint pain, joint swelling and morning stiffness. Negative for myalgias, muscle weakness, muscle tenderness and myalgias.  Skin:  Negative for color change, rash and redness.  Allergic/Immunologic: Negative for susceptible to infections.  Neurological:  Negative for dizziness, numbness, headaches, memory loss and weakness.  Hematological:  Negative for swollen glands.  Psychiatric/Behavioral:  Positive  for sleep disturbance. Negative for confusion.    PMFS History:  Patient Active Problem List   Diagnosis Date Noted   High risk medication use 12/26/2020   Pain in left shoulder 12/11/2020   Pain in left hand 12/11/2020   Weight loss 10/25/2020   Encounter to establish care 10/25/2020   Seropositive rheumatoid arthritis ([place]) 10/25/2020   Fibromyalgia 10/25/2020   Moderate protein-calorie malnutrition ([place]) 10/25/2020   Cervical spine pain 10/23/2020   Lumbar pain 10/23/2020   Other intervertebral disc displacement, lumbar region 04/07/2010   LUMBAR SPRAIN AND STRAIN 12/04/2009    Past Medical History:  Diagnosis Date   Allergy    Arthritis    COPD (chronic obstructive pulmonary disease) ([place])     Family History  Problem Relation Age of Onset   Cancer Mother    Cancer Father    Heart failure Sister    COPD Sister    Dementia Sister    Past Surgical History:  Procedure Laterality Date   ABDOMINAL HYSTERECTOMY     TOE SURGERY Right    1st toe   Social History   Social History Narrative   Lives with her son   Right Handed   Drinks 1-2 cups caffeine daily in winter    There is no immunization history on file for this patient.   Objective: Vital Signs: BP 120/71 (BP Location: Right Arm, Patient Position: Sitting, Cuff Size: Normal)   Pulse 80   Ht 5\' 6"  (1.676 m)   Wt 118 lb (53.5 kg)   BMI 19.05 kg/m  Physical Exam Neurological:     General: No focal deficit present.     Mental Status: She is alert.  Psychiatric:        Mood and Affect: Mood normal.     Musculoskeletal Exam:  Shoulders full ROM, left shoulder pain with overhead abduction and with resisted range of motion and with external rotation color doppler enhancement and swelling around supraspinatus insertion of left shoulder Elbows full ROM no tenderness or swelling Wrists full ROM no tenderness or swelling Fingers left 3rd MCP joint swelling and tenderness to pressure over flexor tendon and  decreased extension range of motion others normal Knees full ROM no tenderness or swelling Ankles full ROM no tenderness or swelling   Left 3rd mcp swelling nodule under flexor tendon  CDAI Exam: CDAI Score: 14  Patient Global: 50 mm; Provider Global: 50 mm Swollen: 2 ; Tender: 2  Joint Exam 12/26/2020      Right  Left  Glenohumeral     Swollen Tender  MCP 3     Swollen Tender     Investigation: No additional findings.  Imaging: DG Chest 2 View  Result Date: 12/27/2020 CLINICAL DATA:  75 year old female with history of methotrexate treatment evaluate for interstitial lung disease. EXAM: CHEST - 2 VIEW COMPARISON:  Chest x-ray 01/10/2011. FINDINGS: Lung volumes are normal. No consolidative airspace disease. No pleural effusions. Mild interstitial prominence most notably in the right upper lobe near the apex. Mild elevation of the minor fissure. No evidence of pulmonary edema. No pneumothorax. No suspicious appearing pulmonary nodules or masses are noted. No evidence of pulmonary edema. Heart size is normal. Upper mediastinal contours are within normal limits. IMPRESSION: 1. There is some mild interstitial prominence in the right upper lobe with evidence of volume loss in the right upper lobe, demonstrated by elevation of the minor fissure. This likely represents some post infectious or inflammatory scarring. If there is clinical concern for interstitial lung disease, these findings could be much better evaluated with follow-up nonemergent high-resolution chest CT. Electronically Signed   By: Vinnie Langton M.D.   On: 12/27/2020 11:02   XR Hand 2 View Left  Result Date: 12/11/2020 X-ray left hand 2 views Radiocarpal joint space appears normal.  Mild first CMC joint degenerative arthritis.  MCP joint spaces appear normal.  Osteoarthritis changes in PIP and DIP joints with increased sclerosis and small lateral osteophyte formation mild joint space narrowing in DIPs.  Bone mineralization  appears normal. Impression Mild first CMC and DIP joints osteoarthritis changes no specific inflammatory changes are seen  XR Hand 2 View Right  Result Date: 12/11/2020 X-ray right hand 2 views Radiocarpal joint space appears normal.  Partial joint subluxation at first [facility] joint.  Bony enlargement partial subluxation and hyperextension position of first MCP joint.  MCP joint spaces appear normal.  PIP and DIP joint spaces appear normal with slightly increased sclerosis several joints.  Cortical thickness may indicate a generalized osteopenia. Impression Changes consistent with posttraumatic arthritis change of the right thumb otherwise minimal arthritis changes  XR Shoulder Left  Result Date: 12/11/2020 X-ray left shoulder 4 views Glenohumeral joint appears normal.  Normal internal and external rotation is seen.  AC joint is patent with no narrowing sclerosis or spurring.  No abnormal mineralization or evidence of fracture seen. Impression No significant degenerative or erosive arthritis changes are seen  XR Shoulder Right  Result Date: 12/11/2020 X-ray right shoulder 4 views Glenohumeral joint space appears normal.  Normal alignment with internal  and external rotation.  AC joint narrowing with bone spurring on superior margin.  No abnormal mineralization or injury seen. Impression AC joint osteoarthritis seen no erosive disease changes   Recent Labs: Lab Results  Component Value Date   WBC 10.6 10/17/2020   HGB 12.2 10/17/2020   PLT 495 (H) 10/17/2020   NA 140 10/17/2020   K 4.4 10/17/2020   CL 101 10/17/2020   CO2 22 10/17/2020   GLUCOSE 100 (H) 10/17/2020   BUN 10 10/17/2020   CREATININE 0.54 (L) 10/17/2020   BILITOT <0.2 10/17/2020   ALKPHOS 126 (H) 10/17/2020   AST 8 10/17/2020   ALT 5 10/17/2020   PROT 7.2 10/17/2020   ALBUMIN 3.9 10/17/2020   CALCIUM 9.3 10/17/2020   GFRAA 88 09/28/2014    Speciality Comments: No specialty comments available.  Procedures:  No procedures  performed Allergies: Patient has no known allergies.   Assessment / Plan:     Visit Diagnoses: Seropositive rheumatoid arthritis ([place]) Polyarthritis - Plan: predniSONE (DELTASONE) 10 MG tablet  Symptoms do appear consistent with seropositive rheumatoid arthritis with the described joint pain swelling stiffness of numerous joints bilaterally that are dramatically improved on oral prednisone.  I recommend her to take the prednisone at the 10 mg dose for now but plan to start methotrexate 15 mg p.o. weekly and folic acid after screening negative hepatitis serology.  Acute pain of left shoulder Pain in left hand  Severe left shoulder pain and left third MCP pain with tenderness inflammation on ultrasound exam is suspicious for more use or injury related as it is changes out of proportion to her other joint response to the prednisone, but could also represent inflammatory activity.  If this fails to further improve could consider additional imaging or evaluation with PT though she is not interested in any injection or procedural treatment at this time.  High risk medication use - Plan: Hepatitis B core antibody, IgM, Hepatitis B surface antigen, Hepatitis C antibody  Plan: DG Chest 2 View  Plan to start methotrexate treatment for RA will check hepatitis serology and baseline chest x-ray for drug toxicity monitoring.   Orders: Orders Placed This Encounter  Procedures   DG Chest 2 View   Hepatitis B core antibody, IgM   Hepatitis B surface antigen   Hepatitis C antibody    Meds ordered this encounter  Medications   predniSONE (DELTASONE) 10 MG tablet    Sig: Take 1 tablet (10 mg total) by mouth daily with breakfast.    Dispense:  30 tablet    Refill:  0      Follow-Up Instructions: Return in about 4 weeks (around 01/23/2021) for New MTX start on GC 4wks f/u.   Collier Salina, MD  Note - This record has been created using [employer].  Chart creation errors have been  sought, but may not always  have been located. Such creation errors do not reflect on  the standard of medical care.

## 2020-12-26 NOTE — Patient Instructions (Signed)
Methotrexate Tablets What is this medication? METHOTREXATE (METH oh TREX ate) treats inflammatory conditions such as arthritis and psoriasis. It works by decreasing inflammation, which can reduce pain and prevent long-term injury to the joints and skin. It may also be used to treat some types of cancer. It works by slowing down the growth of cancercells. This medicine may be used for other purposes; ask your health care provider orpharmacist if you have questions. COMMON BRAND NAME(S): Rheumatrex, Trexall What should I tell my care team before I take this medication? They need to know if you have any of these conditions: Fluid in the stomach area or lungs If you often drink alcohol Infection or immune system problems Kidney disease or on hemodialysis Liver disease Low blood counts, like low white cell, platelet, or red cell counts Lung disease Radiation therapy Stomach ulcers Ulcerative colitis An unusual or allergic reaction to methotrexate, other medications, foods, dyes, or preservatives Pregnant or trying to get pregnant Breast-feeding How should I use this medication? Take this medication by mouth with a glass of water. Follow the directions on the prescription label. Take your medication at regular intervals. Do not take it more often than directed. Do not stop taking except on your care team'sadvice. Make sure you know why you are taking this medication and how often you should take it. If this medication is used for a condition that is not cancer, like arthritis or psoriasis, it should be taken weekly, NOT daily. Taking thismedication more often than directed can cause serious side effects, even death. Talk to your care team about safe handling and disposal of this medication. Youmay need to take special precautions. Talk to your care team about the use of this medication in children. While thismedication may be prescribed for selected conditions, precautions do apply. Overdosage: If  you think you have taken too much of this medicine contact apoison control center or emergency room at once. NOTE: This medicine is only for you. Do not share this medicine with others. What if I miss a dose? If you miss a dose, talk with your care team. Do not take double or extra doses. What may interact with this medication? Do not take this medication with any of the following: Acitretin This medication may also interact with the following: Aspirin and aspirin-like medications including salicylates Azathioprine Certain antibiotics like penicillins, tetracycline, and chloramphenicol Certain medications that treat or prevent blood clots like warfarin, apixaban, dabigatran, and rivaroxaban Certain medications for stomach problems like esomeprazole, omeprazole, pantoprazole Cyclosporine Dapsone Diuretics Gold Hydroxychloroquine Live virus vaccines Medications for infection like acyclovir, adefovir, amphotericin B, bacitracin, cidofovir, foscarnet, ganciclovir, gentamicin, pentamidine, vancomycin Mercaptopurine NSAIDs, medications for pain and inflammation, like ibuprofen or naproxen Other cytotoxic agents Pamidronate Pemetrexed Penicillamine Phenylbutazone Phenytoin Probenecid Pyrimethamine Retinoids such as isotretinoin and tretinoin Steroid medications like prednisone or cortisone Sulfonamides like sulfasalazine and trimethoprim/sulfamethoxazole Theophylline Zoledronic acid This list may not describe all possible interactions. Give your health care provider a list of all the medicines, herbs, non-prescription drugs, or dietary supplements you use. Also tell them if you smoke, drink alcohol, or use illegaldrugs. Some items may interact with your medicine. What should I watch for while using this medication? Avoid alcoholic drinks. This medication can make you more sensitive to the sun. Keep out of the sun. If you cannot avoid being in the sun, wear protective clothing and use  sunscreen.Do not use sun lamps or tanning beds/booths. You may need blood work done while you are taking this medication.  Call your care team for advice if you get a fever, chills or sore throat, or other symptoms of a cold or flu. Do not treat yourself. This medication decreases your body's ability to fight infections. Try to avoid being aroundpeople who are sick. This medication may increase your risk to bruise or bleed. Call your care teamif you notice any unusual bleeding. Be careful brushing or flossing your teeth or using a toothpick because you may get an infection or bleed more easily. If you have any dental work done, Primary school teacher you are receiving this medication. Check with your care team if you get an attack of severe diarrhea, nausea and vomiting, or if you sweat a lot. The loss of too much body fluid can make itdangerous for you to take this medication. Talk to your care team about your risk of cancer. You may be more at risk forcertain types of cancers if you take this medication. Do not become pregnant while taking this medication or for 6 months after stopping it. Women should inform their care team if they wish to become pregnant or think they might be pregnant. Men should not father a child while taking this medication and for 3 months after stopping it. There is potential for serious harm to an unborn child. Talk to your care team for more information. Do not breast-feed an infant while taking this medication or for 1week after stopping it. This medication may make it more difficult to get pregnant or father a child.Talk to your care team if you are concerned about your fertility. What side effects may I notice from receiving this medication? Side effects that you should report to your care team as soon as possible: Allergic reactions-skin rash, itching, hives, swelling of the face, lips, tongue, or throat Blood clot-pain, swelling, or warmth in the leg, shortness of breath, chest  pain Dry cough, shortness of breath or trouble breathing Infection-fever, chills, cough, sore throat, wounds that don't heal, pain or trouble when passing urine, general feeling of discomfort or being unwell Kidney injury-decrease in the amount of urine, swelling of the ankles, hands, or feet Liver injury-right upper belly pain, loss of appetite, nausea, light-colored stool, dark yellow or brown urine, yellowing of the skin or eyes, unusual weakness or fatigue Low red blood cell count-unusual weakness or fatigue, dizziness, headache, trouble breathing Redness, blistering, peeling, or loosening of the skin, including inside the mouth Seizures Unusual bruising or bleeding Side effects that usually do not require medical attention (report to your careteam if they continue or are bothersome): Diarrhea Dizziness Hair loss Nausea Pain, redness, or swelling with sores inside the mouth or throat Vomiting This list may not describe all possible side effects. Call your doctor for medical advice about side effects. You may report side effects to FDA at1-800-FDA-1088.  Prednisone tablets What is this medication? PREDNISONE (PRED ni sone) is a corticosteroid. It is commonly used to treat inflammation of the skin, joints, lungs, and other organs. Common conditions treated include asthma, allergies, and arthritis. It is also used for otherconditions, such as blood disorders and diseases of the adrenal glands. This medicine may be used for other purposes; ask your health care provider orpharmacist if you have questions. COMMON BRAND NAME(S): Deltasone, Predone, Sterapred, Sterapred DS What should I tell my care team before I take this medication? They need to know if you have any of these conditions: Cushing's syndrome diabetes glaucoma heart disease high blood pressure infection (especially a virus infection such as chickenpox,  cold sores, or herpes) kidney disease liver disease mental  illness myasthenia gravis osteoporosis seizures stomach or intestine problems thyroid disease an unusual or allergic reaction to lactose, prednisone, other medicines, foods, dyes, or preservatives pregnant or trying to get pregnant breast-feeding How should I use this medication? Take this medicine by mouth with a glass of water. Follow the directions on the prescription label. Take this medicine with food. If you are taking this medicine once a day, take it in the morning. Do not take more medicine than you are told to take. Do not suddenly stop taking your medicine because you may develop a severe reaction. Your doctor will tell you how much medicine to take. If your doctor wants you to stop the medicine, the dose may be slowly loweredover time to avoid any side effects. Talk to your pediatrician regarding the use of this medicine in children.Special care may be needed. Overdosage: If you think you have taken too much of this medicine contact apoison control center or emergency room at once. NOTE: This medicine is only for you. Do not share this medicine with others. What if I miss a dose? If you miss a dose, take it as soon as you can. If it is almost time for your next dose, talk to your doctor or health care professional. You may need to miss a dose or take an extra dose. Do not take double or extra doses withoutadvice. What may interact with this medication? Do not take this medicine with any of the following medications: metyrapone mifepristone This medicine may also interact with the following medications: aminoglutethimide amphotericin B aspirin and aspirin-like medicines barbiturates certain medicines for diabetes, like glipizide or glyburide cholestyramine cholinesterase inhibitors cyclosporine digoxin diuretics ephedrine female hormones, like estrogens and birth control pills isoniazid ketoconazole NSAIDS, medicines for pain and inflammation, like ibuprofen or  naproxen phenytoin rifampin toxoids vaccines warfarin This list may not describe all possible interactions. Give your health care provider a list of all the medicines, herbs, non-prescription drugs, or dietary supplements you use. Also tell them if you smoke, drink alcohol, or use illegaldrugs. Some items may interact with your medicine. What should I watch for while using this medication? Visit your doctor or health care professional for regular checks on your progress. If you are taking this medicine over a prolonged period, carry an identification card with your name and address, the type and dose of yourmedicine, and your doctor's name and address. This medicine may increase your risk of getting an infection. Tell your doctor or health care professional if you are around anyone with measles orchickenpox, or if you develop sores or blisters that do not heal properly. If you are going to have surgery, tell your doctor or health care professionalthat you have taken this medicine within the last twelve months. Ask your doctor or health care professional about your diet. You may need tolower the amount of salt you eat. This medicine may increase blood sugar. Ask your healthcare provider if changesin diet or medicines are needed if you have diabetes. What side effects may I notice from receiving this medication? Side effects that you should report to your doctor or health care professionalas soon as possible: allergic reactions like skin rash, itching or hives, swelling of the face, lips, or tongue changes in emotions or moods changes in vision depressed mood eye pain fever or chills, cough, sore throat, pain or difficulty passing urine signs and symptoms of high blood sugar such as being more thirsty or  hungry or having to urinate more than normal. You may also feel very tired or have blurry vision. swelling of ankles, feet Side effects that usually do not require medical attention (report to  yourdoctor or health care professional if they continue or are bothersome): confusion, excitement, restlessness headache nausea, vomiting skin problems, acne, thin and shiny skin trouble sleeping weight gain This list may not describe all possible side effects. Call your doctor for medical advice about side effects. You may report side effects to FDA at1-800-FDA-1088. Where should I keep my medication? Keep out of the reach of children. Store at room temperature between 15 and 30 degrees C (59 and 86 degrees F). Protect from light. Keep container tightly closed. Throw away any unusedmedicine after the expiration date. NOTE: This sheet is a summary. It may not cover all possible information. If you have questions about this medicine, talk to your doctor, pharmacist, orhealth care provider.  2022 [employer]vier/[employer] Standard (2018-03-22 10:54:22)

## 2020-12-27 LAB — HEPATITIS B SURFACE ANTIGEN: Hepatitis B Surface Ag: NONREACTIVE

## 2020-12-27 LAB — HEPATITIS B CORE ANTIBODY, IGM: Hep B C IgM: NONREACTIVE

## 2020-12-27 LAB — HEPATITIS C ANTIBODY
Hepatitis C Ab: NONREACTIVE
SIGNAL TO CUT-OFF: 0.01 (ref <1.00)

## 2021-01-02 ENCOUNTER — Other Ambulatory Visit: Payer: Self-pay | Admitting: Family Medicine

## 2021-01-24 ENCOUNTER — Ambulatory Visit: Payer: BC Managed Care – PPO | Admitting: Internal Medicine

## 2021-01-26 NOTE — Progress Notes (Signed)
Office Visit Note  Patient: Laurie Wallace             Date of Birth: June 20, 1946           MRN: [id number]             PCP: Lindell Spar, MD Referring: Lindell Spar, MD Visit Date: 01/27/2021 Occupation: [employer]  Subjective:  Follow-up (Patient was taking Prednisone 10 mg daily but is no longer taking Prednisone. Patient complains of continued bilateral upper extremity pain but otherwise pain has resolved. )   History of Present Illness: Laurie Wallace is a 75 y.o. female for follow up with probable rheumatoid arthritis with increased joint pain especially of the hand and shoulder with elevated inflammatory markers and positive CCP antibodies. Xrays showed no erosive disease. Overall her symptoms are doing better except for ongoing left shoulder pain. She has been off any prednisone since about 1-2 weeks ago. She has not noticed any side effects or problems with the medication.     Previous HPI: 12/11/20 Laurie Wallace is a 75 y.o. female here for evaluation of joint pains in multiple areas with elevated inflammatory markers.  She believes these symptoms in her shoulder started since about 4 5 months ago without any specific preceding injury or event that she can recall.  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  She does not recall any past significant shoulder injuries or surgeries.  Previous treatments tried include cymbalta that she does not notice a significant improvement.  Since last month she was started on oral daily prednisone for the past few weeks and has noticed a significant improvement in her joint symptoms particularly resolution of the hand swelling although does continue having some left shoulder pain.   LAbs reviewed 10/2020 ANA neg ACE neg RF neg ANCAs neg ESR 68 CRP 25 Alk phos 126 Plts 495 CK 55   Review of Systems  Constitutional:   Negative for fatigue.  HENT:  Negative for mouth sores, mouth dryness and nose dryness.   Eyes:  Negative for pain, itching, visual disturbance and dryness.  Respiratory:  Negative for cough, hemoptysis, shortness of breath and difficulty breathing.   Cardiovascular:  Negative for chest pain, palpitations and swelling in legs/feet.  Gastrointestinal:  Negative for abdominal pain, blood in stool, constipation and diarrhea.  Endocrine: Negative for increased urination.  Genitourinary:  Negative for painful urination.  Musculoskeletal:  Positive for joint pain, joint pain and muscle tenderness. Negative for joint swelling, myalgias, muscle weakness, morning stiffness and myalgias.  Skin:  Negative for color change, rash and redness.  Allergic/Immunologic: Negative for susceptible to infections.  Neurological:  Negative for dizziness, numbness, headaches, memory loss and weakness.  Hematological:  Negative for swollen glands.  Psychiatric/Behavioral:  Negative for confusion and sleep disturbance.    PMFS History:  Patient Active Problem List   Diagnosis Date Noted   High risk medication use 12/26/2020   Pain in left shoulder 12/11/2020   Pain in left hand 12/11/2020   Weight loss 10/25/2020   Encounter to establish care 10/25/2020   Seropositive rheumatoid arthritis ([place]) 10/25/2020   Fibromyalgia 10/25/2020   Moderate protein-calorie malnutrition ([place]) 10/25/2020   Cervical spine pain 10/23/2020   Lumbar pain 10/23/2020   Other intervertebral disc displacement, lumbar region 04/07/2010   LUMBAR SPRAIN AND STRAIN 12/04/2009    Past Medical History:  Diagnosis Date  Allergy    Arthritis    COPD (chronic obstructive pulmonary disease) ([place])     Family History  Problem Relation Age of Onset   Cancer Mother    Cancer Father    Heart failure Sister    COPD Sister    Dementia Sister    Past Surgical History:  Procedure Laterality Date   ABDOMINAL HYSTERECTOMY     TOE SURGERY  Right    1st toe   Social History   Social History Narrative   Lives with her son   Right Handed   Drinks 1-2 cups caffeine daily in winter    There is no immunization history on file for this patient.   Objective: Vital Signs: BP 114/71 (BP Location: Left Arm, Patient Position: Sitting, Cuff Size: Normal)   Pulse 66   Ht 5' 6"  (1.676 m)   Wt 118 lb (53.5 kg)   BMI 19.05 kg/m    Physical Exam Cardiovascular:     Rate and Rhythm: Normal rate and regular rhythm.  Pulmonary:     Effort: Pulmonary effort is normal.     Breath sounds: Normal breath sounds.  Skin:    General: Skin is warm and dry.     Findings: No rash.  Neurological:     Mental Status: She is alert.  Psychiatric:        Mood and Affect: Mood normal.     Musculoskeletal Exam:  Left shoulder pain with overhead abduction, decreased external rotation ROM, right shoulder is normal Elbows, wrists, fingers full ROM no tenderness or swelling Knees full ROM no tenderness or swelling.  CDAI Exam: CDAI Score: 6  Patient Global: 30 mm; Provider Global: 20 mm Swollen: 0 ; Tender: 1  Joint Exam 01/27/2021      Right  Left  Glenohumeral      Tender     Investigation: No additional findings.  Imaging: No results found.  Recent Labs: Lab Results  Component Value Date   WBC 10.0 01/27/2021   HGB 13.1 01/27/2021   PLT 423 (H) 01/27/2021   NA 139 01/27/2021   K 4.8 01/27/2021   CL 102 01/27/2021   CO2 29 01/27/2021   GLUCOSE 86 01/27/2021   BUN 11 01/27/2021   CREATININE 0.70 01/27/2021   BILITOT 0.2 01/27/2021   ALKPHOS 126 (H) 10/17/2020   AST 13 01/27/2021   ALT 6 01/27/2021   PROT 6.9 01/27/2021   ALBUMIN 3.9 10/17/2020   CALCIUM 9.3 01/27/2021   GFRAA 88 09/28/2014    Speciality Comments: No specialty comments available.  Procedures:  No procedures performed Allergies: Patient has no known allergies.   Assessment / Plan:     Visit Diagnoses: Seropositive rheumatoid arthritis ([place]) -  Plan: Sedimentation rate  Joint symptoms are mostly improved outside of left shoulder, will check ESR for evidence of disease activity as well. Methotrexate 15 mg PO weekly plus folic acid 1 mg daily will continue off prednisone unless symptoms flare and f/u in about 2 months.  High risk medication use - Plan: COMPLETE METABOLIC PANEL WITH GFR, CBC with Differential/Platelet  Checking CBC and CMP for methotrexate toxicity monitoring today. Chest xray obtained since last visit without evidence of ILD or pulmonary nodules.  Chronic left shoulder pain  Left shoulder pain and decreased ROM without as much swelling or tenderness currently. If symptoms do not improve more in this site would consider steroid injection at f/u.  Orders: Orders Placed This Encounter  Procedures   Sedimentation rate  COMPLETE METABOLIC PANEL WITH GFR   CBC with Differential/Platelet    No orders of the defined types were placed in this encounter.    Follow-Up Instructions: Return in about 10 weeks (around 04/07/2021) for Wallace on MTX f/u 2-3 mos.   Collier Salina, MD  Note - This record has been created using [employer].  Chart creation errors have been sought, but may not always  have been located. Such creation errors do not reflect on  the standard of medical care.

## 2021-01-27 ENCOUNTER — Encounter: Payer: Self-pay | Admitting: Internal Medicine

## 2021-01-27 ENCOUNTER — Other Ambulatory Visit: Payer: Self-pay

## 2021-01-27 ENCOUNTER — Ambulatory Visit: Payer: BC Managed Care – PPO | Admitting: Internal Medicine

## 2021-01-27 VITALS — BP 114/71 | HR 66 | Ht 66.0 in | Wt 118.0 lb

## 2021-01-27 DIAGNOSIS — M25512 Pain in left shoulder: Secondary | ICD-10-CM | POA: Diagnosis not present

## 2021-01-27 DIAGNOSIS — Z79899 Other long term (current) drug therapy: Secondary | ICD-10-CM

## 2021-01-27 DIAGNOSIS — M059 Rheumatoid arthritis with rheumatoid factor, unspecified: Secondary | ICD-10-CM

## 2021-01-27 DIAGNOSIS — G8929 Other chronic pain: Secondary | ICD-10-CM

## 2021-01-27 LAB — COMPLETE METABOLIC PANEL WITH GFR
AG Ratio: 1.3 (calc) (ref 1.0–2.5)
ALT: 6 U/L (ref 6–29)
AST: 13 U/L (ref 10–35)
Albumin: 3.9 g/dL (ref 3.6–5.1)
Alkaline phosphatase (APISO): 82 U/L (ref 37–153)
BUN: 11 mg/dL (ref 7–25)
CO2: 29 mmol/L (ref 20–32)
Calcium: 9.3 mg/dL (ref 8.6–10.4)
Chloride: 102 mmol/L (ref 98–110)
Creat: 0.7 mg/dL (ref 0.60–1.00)
Globulin: 3 g/dL (calc) (ref 1.9–3.7)
Glucose, Bld: 86 mg/dL (ref 65–99)
Potassium: 4.8 mmol/L (ref 3.5–5.3)
Sodium: 139 mmol/L (ref 135–146)
Total Bilirubin: 0.2 mg/dL (ref 0.2–1.2)
Total Protein: 6.9 g/dL (ref 6.1–8.1)
eGFR: 90 mL/min/{1.73_m2} (ref >=60)

## 2021-01-27 LAB — CBC WITH DIFFERENTIAL/PLATELET
Absolute Monocytes: 430 cells/uL (ref 200–950)
Basophils Absolute: 50 cells/uL (ref 0–200)
Basophils Relative: 0.5 %
Eosinophils Absolute: 280 cells/uL (ref 15–500)
Eosinophils Relative: 2.8 %
HCT: 38.8 % (ref 35.0–45.0)
Hemoglobin: 13.1 g/dL (ref 11.7–15.5)
Lymphs Abs: 2530 cells/uL (ref 850–3900)
MCH: 29.8 pg (ref 27.0–33.0)
MCHC: 33.8 g/dL (ref 32.0–36.0)
MCV: 88.2 fL (ref 80.0–100.0)
MPV: 10.6 fL (ref 7.5–12.5)
Monocytes Relative: 4.3 %
Neutro Abs: 6710 cells/uL (ref 1500–7800)
Neutrophils Relative %: 67.1 %
Platelets: 423 10*3/uL — ABNORMAL HIGH (ref 140–400)
RBC: 4.4 10*6/uL (ref 3.80–5.10)
RDW: 14.8 % (ref 11.0–15.0)
Total Lymphocyte: 25.3 %
WBC: 10 10*3/uL (ref 3.8–10.8)

## 2021-01-27 LAB — SEDIMENTATION RATE: Sed Rate: 17 mm/h (ref 0–30)

## 2021-01-28 NOTE — Progress Notes (Signed)
No problems with methotrexate on blood tests so can continue the medication as planned.

## 2021-02-02 MED ORDER — FOLIC ACID 1 MG PO TABS: 1.0000 mg | ORAL_TABLET | Freq: Every day | ORAL | 0 refills | Status: DC

## 2021-02-02 MED ORDER — METHOTREXATE 2.5 MG PO TABS: 15.0000 mg | ORAL_TABLET | ORAL | 0 refills | Status: DC

## 2021-02-28 ENCOUNTER — Ambulatory Visit (INDEPENDENT_AMBULATORY_CARE_PROVIDER_SITE_OTHER): Payer: BC Managed Care – PPO | Admitting: Internal Medicine

## 2021-02-28 ENCOUNTER — Other Ambulatory Visit: Payer: Self-pay

## 2021-02-28 ENCOUNTER — Encounter: Payer: Self-pay | Admitting: Internal Medicine

## 2021-02-28 VITALS — BP 117/69 | HR 76 | Temp 97.6°F | Resp 18 | Ht 66.0 in | Wt 120.0 lb

## 2021-02-28 DIAGNOSIS — R634 Abnormal weight loss: Secondary | ICD-10-CM | POA: Diagnosis not present

## 2021-02-28 DIAGNOSIS — M059 Rheumatoid arthritis with rheumatoid factor, unspecified: Secondary | ICD-10-CM

## 2021-02-28 DIAGNOSIS — R252 Cramp and spasm: Secondary | ICD-10-CM | POA: Diagnosis not present

## 2021-02-28 DIAGNOSIS — E440 Moderate protein-calorie malnutrition: Secondary | ICD-10-CM

## 2021-02-28 DIAGNOSIS — Z23 Encounter for immunization: Secondary | ICD-10-CM

## 2021-02-28 MED ORDER — CYCLOBENZAPRINE HCL 5 MG PO TABS
5.0000 mg | ORAL_TABLET | Freq: Three times a day (TID) | ORAL | 1 refills | Status: DC | PRN
Start: 2021-02-28 — End: 2023-01-10

## 2021-02-28 NOTE — Assessment & Plan Note (Signed)
Likely related to over-exertion related at work Flexeril PRN

## 2021-02-28 NOTE — Assessment & Plan Note (Signed)
Weight stable now

## 2021-02-28 NOTE — Assessment & Plan Note (Signed)
Followed by Rheumatology - last note reviewed Symptoms better now with Methotrexate Continue folic acid

## 2021-02-28 NOTE — Assessment & Plan Note (Signed)
Advised to avoid skipping any meal Ensure supplements Increase fluid intake for proper hydration Avoid over-exertion at work 

## 2021-02-28 NOTE — Progress Notes (Signed)
Established Patient Office Visit  Subjective:  Patient ID: Laurie Wallace, female    DOB: 12-12-1945  Age: 75 y.o. MRN: [id number]  CC:  Chief Complaint  Patient presents with   Weight Check    Follow up    HPI Laurie Sutphen Weatherfordis a 75 year old female with past medical history of RA who presents for follow up of RA and recent weight loss.  Her weight is stable now compared to last visit. Has been eating at regular intervals. Denies any night sweats, cough or dyspnea. She has not sent Cologuard sample yet.  She is on Methotrexate for RA and has been feeling better now. She has not required Prednisone recently. She is followed by Rheumatology. Last blood tests were reviewed and discussed with the patient.  She c/o leg cramps at times, especially in the evening. Denies any recent injury.  Past Medical History:  Diagnosis Date   Allergy    Arthritis    COPD (chronic obstructive pulmonary disease) (HCC)     Past Surgical History:  Procedure Laterality Date   ABDOMINAL HYSTERECTOMY     TOE SURGERY Right    1st toe    Family History  Problem Relation Age of Onset   Cancer Mother    Cancer Father    Heart failure Sister    COPD Sister    Dementia Sister     Social History   Socioeconomic History   Marital status: Single    Spouse name: Not on file   Number of children: Not on file   Years of education: Not on file   Highest education level: Not on file  Occupational History   Occupation: full time  Tobacco Use   Smoking status: Never   Smokeless tobacco: Never  Vaping Use   Vaping Use: Never used  Substance and Sexual Activity   Alcohol use: No   Drug use: No   Sexual activity: Not on file  Other Topics Concern   Not on file  Social History Narrative   Lives with her son   Right Handed   Drinks 1-2 cups caffeine daily in winter   Social Determinants of Health   Financial Resource Strain: Not on file  Food Insecurity: Not on file   Transportation Needs: Not on file  Physical Activity: Not on file  Stress: Not on file  Social Connections: Not on file  Intimate Partner Violence: Not on file    Outpatient Medications Prior to Visit  Medication Sig Dispense Refill   alendronate (FOSAMAX) 70 MG tablet Take 1 tablet (70 mg total) by mouth every 7 (seven) days. Take with a full glass of water on an empty stomach. 4 tablet 11   Calcium Carbonate-Vit D-Min (CALTRATE 600+D PLUS MINERALS) 600-800 MG-UNIT CHEW Chew 1 tablet by mouth daily. 30 tablet 5   folic acid (FOLVITE) 1 MG tablet Take 1 tablet (1 mg total) by mouth daily. 90 tablet 0   levocetirizine (XYZAL) 5 MG tablet   5   methotrexate (RHEUMATREX) 2.5 MG tablet Take 6 tablets (15 mg total) by mouth once a week. Caution:Chemotherapy. Protect from light. 72 tablet 0   DULoxetine (CYMBALTA) 30 MG capsule TAKE 1 CAPSULE BY MOUTH EVERY DAY 90 capsule 1   No facility-administered medications prior to visit.    No Known Allergies  ROS Review of Systems  Constitutional:  Negative for chills and fever.  HENT:  Negative for congestion, sinus pressure, sinus pain and sore throat.   Eyes:  Negative for pain and discharge.  Respiratory:  Negative for cough and shortness of breath.   Cardiovascular:  Negative for chest pain and palpitations.  Gastrointestinal:  Negative for abdominal pain, constipation, diarrhea, nausea and vomiting.  Endocrine: Negative for polydipsia and polyuria.  Genitourinary:  Negative for dysuria and hematuria.  Musculoskeletal:  Positive for arthralgias, back pain and myalgias. Negative for neck pain and neck stiffness.  Skin:  Negative for rash.  Neurological:  Negative for dizziness and weakness.  Psychiatric/Behavioral:  Negative for agitation and behavioral problems.      Objective:    Physical Exam Vitals reviewed.  Constitutional:      General: She is not in acute distress.    Appearance: She is not diaphoretic.  HENT:     Head:  Normocephalic and atraumatic.     Nose: Nose normal.     Mouth/Throat:     Mouth: Mucous membranes are moist.  Eyes:     General: No scleral icterus.    Extraocular Movements: Extraocular movements intact.  Cardiovascular:     Rate and Rhythm: Normal rate and regular rhythm.     Pulses: Normal pulses.     Heart sounds: Normal heart sounds. No murmur heard. Pulmonary:     Breath sounds: Normal breath sounds. No wheezing or rales.  Abdominal:     Palpations: Abdomen is soft.     Tenderness: There is no abdominal tenderness.  Musculoskeletal:     Cervical back: Neck supple. No tenderness.     Right lower leg: No edema.     Left lower leg: No edema.  Skin:    General: Skin is warm.     Findings: No rash.  Neurological:     General: No focal deficit present.     Mental Status: She is alert and oriented to person, place, and time.  Psychiatric:        Mood and Affect: Mood normal.        Behavior: Behavior normal.    BP 117/69 (BP Location: Right Arm, Patient Position: Sitting, Cuff Size: Normal)   Pulse 76   Temp 97.6 F (36.4 C)   Resp 18   Ht 5' 6"  (1.676 m)   Wt 120 lb (54.4 kg)   SpO2 97%   BMI 19.37 kg/m  Wt Readings from Last 3 Encounters:  02/28/21 120 lb (54.4 kg)  01/27/21 118 lb (53.5 kg)  12/26/20 118 lb (53.5 kg)     Health Maintenance Due  Topic Date Due   COVID-19 Vaccine (1) Never done   TETANUS/TDAP  Never done   Zoster Vaccines- Shingrix (1 of 2) Never done   COLONOSCOPY (Pts 45-28yr Insurance coverage will need to be confirmed)  Never done   PNA vac Low Risk Adult (1 of 2 - PCV13) Never done   INFLUENZA VACCINE  02/03/2021    There are no preventive care reminders to display for this patient.  Lab Results  Component Value Date   TSH 1.950 10/17/2020   Lab Results  Component Value Date   WBC 10.0 01/27/2021   HGB 13.1 01/27/2021   HCT 38.8 01/27/2021   MCV 88.2 01/27/2021   PLT 423 (H) 01/27/2021   Lab Results  Component Value Date    NA 139 01/27/2021   K 4.8 01/27/2021   CO2 29 01/27/2021   GLUCOSE 86 01/27/2021   BUN 11 01/27/2021   CREATININE 0.70 01/27/2021   BILITOT 0.2 01/27/2021   ALKPHOS 126 (H) 10/17/2020  AST 13 01/27/2021   ALT 6 01/27/2021   PROT 6.9 01/27/2021   ALBUMIN 3.9 10/17/2020   CALCIUM 9.3 01/27/2021   EGFR 90 01/27/2021   No results found for: CHOL No results found for: HDL No results found for: LDLCALC No results found for: TRIG No results found for: CHOLHDL No results found for: HGBA1C    Assessment & Plan:   Problem List Items Addressed This Visit       Musculoskeletal and Integument   Seropositive rheumatoid arthritis ([place]) - Primary    Followed by Rheumatology - last note reviewed Symptoms better now with Methotrexate Continue folic acid      Relevant Medications   cyclobenzaprine (FLEXERIL) 5 MG tablet     Other   Weight loss    Weight stable now      Moderate protein-calorie malnutrition ([place])    Advised to avoid skipping any meal Ensure supplements Increase fluid intake for proper hydration Avoid over-exertion at work      Muscle cramps    Likely related to over-exertion related at work Flexeril PRN      Relevant Medications   cyclobenzaprine (FLEXERIL) 5 MG tablet    Meds ordered this encounter  Medications   cyclobenzaprine (FLEXERIL) 5 MG tablet    Sig: Take 1 tablet (5 mg total) by mouth 3 (three) times daily as needed for muscle spasms.    Dispense:  30 tablet    Refill:  1    Follow-up: Return in about 3 months (around 05/31/2021) for Annual physical.    Lindell Spar, MD

## 2021-02-28 NOTE — Patient Instructions (Signed)
Please take Flexeril for muscle spasms as needed only.  Please stop taking Duloxetine. Continue other medications as prescribed.  Continue to take protein supplements and eat at regular intervals.

## 2021-04-06 NOTE — Progress Notes (Signed)
Office Visit Note  Patient: Laurie Wallace             Date of Birth: 1946/06/10           MRN: [id number]             PCP: Lindell Spar, MD Referring: Lindell Spar, MD Visit Date: 04/07/2021   Subjective:  Follow-up (Total body pain, patient has not had COVID vaccines)   History of Present Illness: Laurie Wallace is a 75 y.o. female here for follow up for seropositive RA after starting methotrexate 15 mg PO weekly and folic acid 1 mg daily. She noticed no problems with this and was doing well until about 1 week ago bilateral shoulder pain became much worse. She also noticed pain in right neck and base of skull, headaches going to above the ear, and pain at the right collar bone. No preceding event or changes although she continues working a lot. She stopped cymbalta without noticing any changes. She is not at all interested in injection treatments, says past shoulder injection helped for a few months.   Previous HPI 01/27/21 [name] is a 75 y.o. female for follow up with probable rheumatoid arthritis with increased joint pain especially of the hand and shoulder with elevated inflammatory markers and positive CCP antibodies. Xrays showed no erosive disease. Overall her symptoms are doing better except for ongoing left shoulder pain. She has been off any prednisone since about 1-2 weeks ago. She has not noticed any side effects or problems with the medication.    12/11/20 [name] is a 75 y.o. female here for evaluation of joint pains in multiple areas with elevated inflammatory markers.  She believes these symptoms in her shoulder started since about 4 5 months ago without any specific preceding injury or event that she can recall.  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  She does not recall any past significant shoulder injuries or surgeries.   Previous treatments tried include cymbalta that she does not notice a significant improvement.  Since last month she was started on oral daily prednisone for the past few weeks and has noticed a significant improvement in her joint symptoms particularly resolution of the hand swelling although does continue having some left shoulder pain.   LAbs reviewed 10/2020 ANA neg ACE neg RF neg ANCAs neg ESR 68 CRP 25 Alk phos 126 Plts 495 CK 55   Review of Systems  Constitutional:  Positive for fatigue.  HENT:  Negative for mouth dryness.   Eyes:  Negative for dryness.  Respiratory:  Negative for shortness of breath.   Cardiovascular:  Negative for swelling in legs/feet.  Gastrointestinal:  Negative for constipation.  Endocrine: Negative for excessive thirst.  Genitourinary:  Negative for difficulty urinating.  Musculoskeletal:  Positive for joint pain, joint pain, morning stiffness and muscle tenderness.  Skin:  Negative for rash.  Allergic/Immunologic: Negative for susceptible to infections.  Neurological:  Negative for numbness.  Hematological:  Negative for bruising/bleeding tendency.  Psychiatric/Behavioral:  Negative for sleep disturbance.    PMFS History:  Patient Active Problem List   Diagnosis Date Noted   Muscle cramps 02/28/2021   High risk medication use 12/26/2020   Pain in left shoulder 12/11/2020   Pain in left hand 12/11/2020   Weight loss 10/25/2020   Seropositive rheumatoid arthritis ([place]) 10/25/2020   Moderate protein-calorie  malnutrition ([place]) 10/25/2020   Cervical spine pain 10/23/2020   Lumbar pain 10/23/2020   Other intervertebral disc displacement, lumbar region 04/07/2010    Past Medical History:  Diagnosis Date   Allergy    Arthritis    COPD (chronic obstructive pulmonary disease) ([place])     Family History  Problem Relation Age of Onset   Cancer Mother    Cancer Father    Heart failure Sister    COPD Sister    Dementia Sister    Past Surgical  History:  Procedure Laterality Date   ABDOMINAL HYSTERECTOMY     TOE SURGERY Right    1st toe   Social History   Social History Narrative   Lives with her son   Right Handed   Drinks 1-2 cups caffeine daily in winter   Immunization History  Administered Date(s) Administered   PNEUMOCOCCAL CONJUGATE-20 02/28/2021     Objective: Vital Signs: BP (!) 126/58 (BP Location: Left Arm, Patient Position: Sitting, Cuff Size: Normal)   Pulse 71   Resp 16   Ht 5' 6"  (1.676 m)   Wt 121 lb (54.9 kg)   BMI 19.53 kg/m    Physical Exam Musculoskeletal:     Right lower leg: No edema.     Left lower leg: No edema.  Skin:    General: Skin is warm and dry.     Findings: No rash.  Neurological:     Mental Status: She is alert.     Comments: Shoulder abduction limited unable to active raise to horizontal or above, pain with abduction lower Hip flexion full strength bilaterally  Psychiatric:        Mood and Affect: Mood normal.     Musculoskeletal Exam:  Neck full ROM right sided tenderness laterally and base of skull Shoulders unable to abduct actively overhead b/l with pain, passive ROM mostly intact with severe guarding against full abduction, left shoulder swelling and tenderness to pressure laterally Elbows full ROM no tenderness or swelling Wrists full ROM no tenderness or swelling Fingers full ROM no tenderness or swelling  CDAI Exam: CDAI Score: 14  Patient Global: 60 mm; Provider Global: 50 mm Swollen: 1 ; Tender: 3  Joint Exam 04/07/2021      Right  Left  Sternoclavicular   Tender     Glenohumeral   Tender  Swollen Tender     Investigation: No additional findings.  Imaging: No results found.  Recent Labs: Lab Results  Component Value Date   WBC 11.3 (H) 04/07/2021   HGB 12.6 04/07/2021   PLT 394 04/07/2021   NA 139 04/07/2021   K 4.7 04/07/2021   CL 103 04/07/2021   CO2 27 04/07/2021   GLUCOSE 86 04/07/2021   BUN 16 04/07/2021   CREATININE 0.77  04/07/2021   BILITOT 0.3 04/07/2021   ALKPHOS 126 (H) 10/17/2020   AST 15 04/07/2021   ALT 9 04/07/2021   PROT 7.1 04/07/2021   ALBUMIN 3.9 10/17/2020   CALCIUM 9.4 04/07/2021   GFRAA 88 09/28/2014    Speciality Comments: No specialty comments available.  Procedures:  No procedures performed Allergies: Patient has no known allergies.   Assessment / Plan:     Visit Diagnoses: Seropositive rheumatoid arthritis ([place]) - Plan: Sedimentation rate  Symptoms outside of the shoulder seem well improved but shoulder problems are worse by significant amount.  Still not clear how much this is related to inflammatory arthritis versus injury and/or use related problem.  Checking sedimentation rate  for disease activity monitoring. If this is high may benefit with increasing dose otherwise continue methotrexate 15 mg PO weekly and folic acid 1 mg PO daily.  High risk medication use - Plan: CBC with Differential/Platelet, COMPLETE METABOLIC PANEL WITH GFR  Continued methotrexate treatment needs monitoring for cytopenia or hepatotoxicity checking CBC and CMP today.  Orders: Orders Placed This Encounter  Procedures   Sedimentation rate   CBC with Differential/Platelet   COMPLETE METABOLIC PANEL WITH GFR    No orders of the defined types were placed in this encounter.    Follow-Up Instructions: Return in about 3 months (around 07/08/2021) for RA on MTX f/u 54mo.   CCollier Salina MD  Note - This record has been created using [employer]  Chart creation errors have been sought, but may not always  have been located. Such creation errors do not reflect on  the standard of medical care.

## 2021-04-07 ENCOUNTER — Ambulatory Visit: Payer: BC Managed Care – PPO | Admitting: Internal Medicine

## 2021-04-07 ENCOUNTER — Other Ambulatory Visit: Payer: Self-pay

## 2021-04-07 ENCOUNTER — Encounter: Payer: Self-pay | Admitting: Internal Medicine

## 2021-04-07 VITALS — BP 126/58 | HR 71 | Resp 16 | Ht 66.0 in | Wt 121.0 lb

## 2021-04-07 DIAGNOSIS — Z79899 Other long term (current) drug therapy: Secondary | ICD-10-CM

## 2021-04-07 DIAGNOSIS — M059 Rheumatoid arthritis with rheumatoid factor, unspecified: Secondary | ICD-10-CM | POA: Diagnosis not present

## 2021-04-08 LAB — COMPLETE METABOLIC PANEL WITH GFR
AG Ratio: 1.4 (calc) (ref 1.0–2.5)
ALT: 9 U/L (ref 6–29)
AST: 15 U/L (ref 10–35)
Albumin: 4.1 g/dL (ref 3.6–5.1)
Alkaline phosphatase (APISO): 91 U/L (ref 37–153)
BUN: 16 mg/dL (ref 7–25)
CO2: 27 mmol/L (ref 20–32)
Calcium: 9.4 mg/dL (ref 8.6–10.4)
Chloride: 103 mmol/L (ref 98–110)
Creat: 0.77 mg/dL (ref 0.60–1.00)
Globulin: 3 g/dL (calc) (ref 1.9–3.7)
Glucose, Bld: 86 mg/dL (ref 65–99)
Potassium: 4.7 mmol/L (ref 3.5–5.3)
Sodium: 139 mmol/L (ref 135–146)
Total Bilirubin: 0.3 mg/dL (ref 0.2–1.2)
Total Protein: 7.1 g/dL (ref 6.1–8.1)
eGFR: 80 mL/min/{1.73_m2} (ref >=60)

## 2021-04-08 LAB — CBC WITH DIFFERENTIAL/PLATELET
Absolute Monocytes: 689 cells/uL (ref 200–950)
Basophils Absolute: 45 cells/uL (ref 0–200)
Basophils Relative: 0.4 %
Eosinophils Absolute: 226 cells/uL (ref 15–500)
Eosinophils Relative: 2 %
HCT: 37.3 % (ref 35.0–45.0)
Hemoglobin: 12.6 g/dL (ref 11.7–15.5)
Lymphs Abs: 2283 cells/uL (ref 850–3900)
MCH: 29.8 pg (ref 27.0–33.0)
MCHC: 33.8 g/dL (ref 32.0–36.0)
MCV: 88.2 fL (ref 80.0–100.0)
MPV: 10.3 fL (ref 7.5–12.5)
Monocytes Relative: 6.1 %
Neutro Abs: 8057 cells/uL — ABNORMAL HIGH (ref 1500–7800)
Neutrophils Relative %: 71.3 %
Platelets: 394 10*3/uL (ref 140–400)
RBC: 4.23 10*6/uL (ref 3.80–5.10)
RDW: 15.1 % — ABNORMAL HIGH (ref 11.0–15.0)
Total Lymphocyte: 20.2 %
WBC: 11.3 10*3/uL — ABNORMAL HIGH (ref 3.8–10.8)

## 2021-04-08 LAB — SEDIMENTATION RATE: Sed Rate: 38 mm/h — ABNORMAL HIGH (ref 0–30)

## 2021-04-17 ENCOUNTER — Telehealth: Payer: Self-pay | Admitting: *Deleted

## 2021-04-17 MED ORDER — METHOTREXATE 2.5 MG PO TABS
20.0000 mg | ORAL_TABLET | ORAL | 0 refills | Status: DC
Start: 2021-04-17 — End: 2021-07-10

## 2021-04-17 MED ORDER — FOLIC ACID 1 MG PO TABS: 1.0000 mg | ORAL_TABLET | Freq: Every day | ORAL | 0 refills | Status: DC

## 2021-04-17 NOTE — Telephone Encounter (Signed)
Patient's granddaughter Thornton Papas called requesting patient's lab results.

## 2021-04-17 NOTE — Telephone Encounter (Signed)
Labs show a small increase in sedimentation rate and white blood cell count probably indicating some continued rheumatoid arthritis activity. I recommend she increase the methotrexate dose to 8 tablets per week instead of 6. Taking 8 tablets weekly I recommend splitting the dose half morning half evening is absorbed better.

## 2021-04-17 NOTE — Addendum Note (Signed)
Addended by: Collier Salina on: 04/17/2021 11:52 PM   Modules accepted: Orders

## 2021-04-18 NOTE — Telephone Encounter (Signed)
Spoke with Laurie Wallace and advised  labs show a small increase in sedimentation rate and white blood cell count probably indicating some continued rheumatoid arthritis activity. Dr. Benjamine Mola recommends she increase the methotrexate dose to 8 tablets per week instead of 6. Taking 8 tablets weekly. Dr. Benjamine Mola recommends splitting the dose half morning half evening is absorbed better. Advised prescription has been sent to the pharmacy.

## 2021-05-07 ENCOUNTER — Ambulatory Visit (INDEPENDENT_AMBULATORY_CARE_PROVIDER_SITE_OTHER): Payer: BC Managed Care – PPO

## 2021-05-07 ENCOUNTER — Other Ambulatory Visit: Payer: Self-pay

## 2021-05-07 DIAGNOSIS — Z0000 Encounter for general adult medical examination without abnormal findings: Secondary | ICD-10-CM | POA: Diagnosis not present

## 2021-05-07 NOTE — Patient Instructions (Signed)
Laurie Wallace , Thank you for taking time to come for your Medicare Wellness Visit. I appreciate your ongoing commitment to your health goals. Please review the following plan we discussed and let me know if I can assist you in the future.   Screening recommendations/referrals: Colonoscopy: due now Mammogram: complete Bone Density: complete Recommended yearly ophthalmology/optometry visit for glaucoma screening and checkup Recommended yearly dental visit for hygiene and checkup  Vaccinations: Influenza vaccine: due now Pneumococcal vaccine: complete Tdap vaccine: due now Shingles vaccine: due now    Advanced directives: yes  Next appointment: 1 year   Preventive Care 24 Years and Older, Female Preventive care refers to lifestyle choices and visits with your health care provider that can promote health and wellness. What does preventive care include? A yearly physical exam. This is also called an annual well check. Dental exams once or twice a year. Routine eye exams. Ask your health care provider how often you should have your eyes checked. Personal lifestyle choices, including: Daily care of your teeth and gums. Regular physical activity. Eating a healthy diet. Avoiding tobacco and drug use. Limiting alcohol use. Practicing safe sex. Taking low-dose aspirin every day. Taking vitamin and mineral supplements as recommended by your health care provider. What happens during an annual well check? The services and screenings done by your health care provider during your annual well check will depend on your age, overall health, lifestyle risk factors, and family history of disease. Counseling  Your health care provider may ask you questions about your: Alcohol use. Tobacco use. Drug use. Emotional well-being. Home and relationship well-being. Sexual activity. Eating habits. History of falls. Memory and ability to understand (cognition). Work and work  Statistician. Reproductive health. Screening  You may have the following tests or measurements: Height, weight, and BMI. Blood pressure. Lipid and cholesterol levels. These may be checked every 5 years, or more frequently if you are over 22 years old. Skin check. Lung cancer screening. You may have this screening every year starting at age 107 if you have a 30-pack-year history of smoking and currently smoke or have quit within the past 15 years. Fecal occult blood test (FOBT) of the stool. You may have this test every year starting at age 11. Flexible sigmoidoscopy or colonoscopy. You may have a sigmoidoscopy every 5 years or a colonoscopy every 10 years starting at age 90. Hepatitis C blood test. Hepatitis B blood test. Sexually transmitted disease (STD) testing. Diabetes screening. This is done by checking your blood sugar (glucose) after you have not eaten for a while (fasting). You may have this done every 1-3 years. Bone density scan. This is done to screen for osteoporosis. You may have this done starting at age 3. Mammogram. This may be done every 1-2 years. Talk to your health care provider about how often you should have regular mammograms. Talk with your health care provider about your test results, treatment options, and if necessary, the need for more tests. Vaccines  Your health care provider may recommend certain vaccines, such as: Influenza vaccine. This is recommended every year. Tetanus, diphtheria, and acellular pertussis (Tdap, Td) vaccine. You may need a Td booster every 10 years. Zoster vaccine. You may need this after age 21. Pneumococcal 13-valent conjugate (PCV13) vaccine. One dose is recommended after age 58. Pneumococcal polysaccharide (PPSV23) vaccine. One dose is recommended after age 14. Talk to your health care provider about which screenings and vaccines you need and how often you need them. This information is  not intended to replace advice given to you by  your health care provider. Make sure you discuss any questions you have with your health care provider. Document Released: 07/19/2015 Document Revised: 03/11/2016 Document Reviewed: 04/23/2015 Elsevier Interactive Patient Education  2017 [place] Prevention in the Home Falls can cause injuries. They can happen to people of all ages. There are many things you can do to make your home safe and to help prevent falls. What can I do on the outside of my home? Regularly fix the edges of walkways and driveways and fix any cracks. Remove anything that might make you trip as you walk through a door, such as a raised step or threshold. Trim any bushes or trees on the path to your home. Use bright outdoor lighting. Clear any walking paths of anything that might make someone trip, such as rocks or tools. Regularly check to see if handrails are loose or broken. Make sure that both sides of any steps have handrails. Any raised decks and porches should have guardrails on the edges. Have any leaves, snow, or ice cleared regularly. Use sand or salt on walking paths during winter. Clean up any spills in your garage right away. This includes oil or grease spills. What can I do in the bathroom? Use night lights. Install grab bars by the toilet and in the tub and shower. Do not use towel bars as grab bars. Use non-skid mats or decals in the tub or shower. If you need to sit down in the shower, use a plastic, non-slip stool. Keep the floor dry. Clean up any water that spills on the floor as soon as it happens. Remove soap buildup in the tub or shower regularly. Attach bath mats securely with double-sided non-slip rug tape. Do not have throw rugs and other things on the floor that can make you trip. What can I do in the bedroom? Use night lights. Make sure that you have a light by your bed that is easy to reach. Do not use any sheets or blankets that are too big for your bed. They should not hang  down onto the floor. Have a firm chair that has side arms. You can use this for support while you get dressed. Do not have throw rugs and other things on the floor that can make you trip. What can I do in the kitchen? Clean up any spills right away. Avoid walking on wet floors. Keep items that you use a lot in easy-to-reach places. If you need to reach something above you, use a strong step stool that has a grab bar. Keep electrical cords out of the way. Do not use floor polish or wax that makes floors slippery. If you must use wax, use non-skid floor wax. Do not have throw rugs and other things on the floor that can make you trip. What can I do with my stairs? Do not leave any items on the stairs. Make sure that there are handrails on both sides of the stairs and use them. Fix handrails that are broken or loose. Make sure that handrails are as long as the stairways. Check any carpeting to make sure that it is firmly attached to the stairs. Fix any carpet that is loose or worn. Avoid having throw rugs at the top or bottom of the stairs. If you do have throw rugs, attach them to the floor with carpet tape. Make sure that you have a light switch at the top of the  stairs and the bottom of the stairs. If you do not have them, ask someone to add them for you. What else can I do to help prevent falls? Wear shoes that: Do not have high heels. Have rubber bottoms. Are comfortable and fit you well. Are closed at the toe. Do not wear sandals. If you use a stepladder: Make sure that it is fully opened. Do not climb a closed stepladder. Make sure that both sides of the stepladder are locked into place. Ask someone to hold it for you, if possible. Clearly mark and make sure that you can see: Any grab bars or handrails. First and last steps. Where the edge of each step is. Use tools that help you move around (mobility aids) if they are needed. These  include: Canes. Walkers. Scooters. Crutches. Turn on the lights when you go into a dark area. Replace any light bulbs as soon as they burn out. Set up your furniture so you have a clear path. Avoid moving your furniture around. If any of your floors are uneven, fix them. If there are any pets around you, be aware of where they are. Review your medicines with your doctor. Some medicines can make you feel dizzy. This can increase your chance of falling. Ask your doctor what other things that you can do to help prevent falls. This information is not intended to replace advice given to you by your health care provider. Make sure you discuss any questions you have with your health care provider. Document Released: 04/18/2009 Document Revised: 11/28/2015 Document Reviewed: 07/27/2014 Elsevier Interactive Patient Education  2017 [employer].

## 2021-05-07 NOTE — Progress Notes (Signed)
Subjective:   Laurie Wallace is a 75 y.o. female who presents for an Initial Medicare Annual Wellness Visit.  I connected with  Laurie Wallace on 05/07/21 by a audio enabled telemedicine application and verified that I am speaking with the correct person using two identifiers.   I discussed the limitations, risks, security and privacy concerns of performing an evaluation and management service by telephone and the availability of in person appointments. I also discussed with the patient that there may be a patient responsible charge related to this service. The patient expressed understanding and verbally consented to this telephonic visit.   Review of Systems           Objective:    There were no vitals filed for this visit. There is no height or weight on file to calculate BMI.  Advanced Directives 03/28/2018 02/27/2018 08/18/2017 08/17/2017 08/15/2017  Does Patient Have a Medical Advance Directive? No No No No No  Would patient like information on creating a medical advance directive? No - Patient declined No - Patient declined - No - Patient declined No - Patient declined    Current Medications (verified) Outpatient Encounter Medications as of 05/07/2021  Medication Sig   alendronate (FOSAMAX) 70 MG tablet Take 1 tablet (70 mg total) by mouth every 7 (seven) days. Take with a full glass of water on an empty stomach. (Patient not taking: Reported on 04/07/2021)   Calcium Carbonate-Vit D-Min (CALTRATE 600+D PLUS MINERALS) 600-800 MG-UNIT CHEW Chew 1 tablet by mouth daily.   cyclobenzaprine (FLEXERIL) 5 MG tablet Take 1 tablet (5 mg total) by mouth 3 (three) times daily as needed for muscle spasms.   folic acid (FOLVITE) 1 MG tablet Take 1 tablet (1 mg total) by mouth daily.   levocetirizine (XYZAL) 5 MG tablet    methotrexate (RHEUMATREX) 2.5 MG tablet Take 8 tablets (20 mg total) by mouth once a week. Caution:Chemotherapy. Protect from light.   [DISCONTINUED]  brompheniramine (VAZOL) 2 MG/5ML LIQD Take 2 mg by mouth 2 (two) times daily.   [DISCONTINUED] fluticasone (FLONASE) 50 MCG/ACT nasal spray Place 2 sprays into the nose daily.   No facility-administered encounter medications on file as of 05/07/2021.    Allergies (verified) Patient has no known allergies.   History: Past Medical History:  Diagnosis Date   Allergy    Arthritis    COPD (chronic obstructive pulmonary disease) (HCC)    Past Surgical History:  Procedure Laterality Date   ABDOMINAL HYSTERECTOMY     TOE SURGERY Right    1st toe   Family History  Problem Relation Age of Onset   Cancer Mother    Cancer Father    Heart failure Sister    COPD Sister    Dementia Sister    Social History   Socioeconomic History   Marital status: Single    Spouse name: Not on file   Number of children: Not on file   Years of education: Not on file   Highest education level: Not on file  Occupational History   Occupation: full time  Tobacco Use   Smoking status: Never   Smokeless tobacco: Never  Vaping Use   Vaping Use: Never used  Substance and Sexual Activity   Alcohol use: No   Drug use: No   Sexual activity: Not on file  Other Topics Concern   Not on file  Social History Narrative   Lives with her son   Right Handed   Drinks 1-2 cups  caffeine daily in winter   Social Determinants of Health   Financial Resource Strain: Not on file  Food Insecurity: Not on file  Transportation Needs: Not on file  Physical Activity: Not on file  Stress: Not on file  Social Connections: Not on file    Tobacco Counseling Counseling given: Not Answered   Clinical Intake:                 Diabetic?no         Activities of Daily Living In your present state of health, do you have any difficulty performing the following activities: 10/25/2020  Hearing? N  Vision? N  Difficulty concentrating or making decisions? N  Walking or climbing stairs? N  Dressing or  bathing? N  Doing errands, shopping? N  Some recent data might be hidden    Patient Care Team: Lindell Spar, MD as PCP - General (Internal Medicine) Scot Jun, FNP as Nurse Practitioner (Family Medicine)  Indicate any recent Medical Services you may have received from other than [facility] providers in the past year (date may be approximate).     Assessment:   This is a routine wellness examination for Laurie Wallace.  Hearing/Vision screen No results found.  Dietary issues and exercise activities discussed:     Goals Addressed   None   Depression Screen PHQ 2/9 Scores 02/28/2021 11/07/2020 10/25/2020  PHQ - 2 Score 0 0 0    Fall Risk Fall Risk  02/28/2021 11/07/2020 10/25/2020  Falls in the past year? 0 0 0  Number falls in past yr: 0 0 0  Injury with Fall? 0 0 0  Risk for fall due to : No Fall Risks No Fall Risks No Fall Risks  Follow up Falls evaluation completed Falls evaluation completed Falls evaluation completed    [place]:  Any stairs in or around the home? No  If so, are there any without handrails? No  Home free of loose throw rugs in walkways, pet beds, electrical cords, etc? Yes  Adequate lighting in your home to reduce risk of falls? Yes   ASSISTIVE DEVICES UTILIZED TO PREVENT FALLS:  Life alert? No  Use of a cane, walker or w/c? No  Grab bars in the bathroom? Yes  Shower chair or bench in shower? No  Elevated toilet seat or a handicapped toilet? No   TIMED UP AND GO:  Was the test performed? No .  Length of time to ambulate 10 feet: NA sec.     Cognitive Function:        Immunizations Immunization History  Administered Date(s) Administered   PNEUMOCOCCAL CONJUGATE-20 02/28/2021    TDAP status: Due, Education has been provided regarding the importance of this vaccine. Advised may receive this vaccine at local pharmacy or Health Dept. Aware to provide a copy of the vaccination record if obtained from local  pharmacy or Health Dept. Verbalized acceptance and understanding.  Flu Vaccine status: Due, Education has been provided regarding the importance of this vaccine. Advised may receive this vaccine at local pharmacy or Health Dept. Aware to provide a copy of the vaccination record if obtained from local pharmacy or Health Dept. Verbalized acceptance and understanding.  Pneumococcal vaccine status: Declined,  Education has been provided regarding the importance of this vaccine but patient still declined. Advised may receive this vaccine at local pharmacy or Health Dept. Aware to provide a copy of the vaccination record if obtained from local pharmacy or Health  Dept. Verbalized acceptance and understanding.   Covid-19 vaccine status: Declined, Education has been provided regarding the importance of this vaccine but patient still declined. Advised may receive this vaccine at local pharmacy or Health Dept.or vaccine clinic. Aware to provide a copy of the vaccination record if obtained from local pharmacy or Health Dept. Verbalized acceptance and understanding.  Qualifies for Shingles Vaccine? Yes   Zostavax completed Yes   Shingrix Completed?: No.    Education has been provided regarding the importance of this vaccine. Patient has been advised to call insurance company to determine out of pocket expense if they have not yet received this vaccine. Advised may also receive vaccine at local pharmacy or Health Dept. Verbalized acceptance and understanding.  Screening Tests Health Maintenance  Topic Date Due   COVID-19 Vaccine (1) Never done   TETANUS/TDAP  Never done   Zoster Vaccines- Shingrix (1 of 2) Never done   COLONOSCOPY (Pts 45-31yrs Insurance coverage will need to be confirmed)  Never done   INFLUENZA VACCINE  Never done   Pneumonia Vaccine 51+ Years old  Completed   DEXA SCAN  Completed   Hepatitis C Screening  Completed   HPV VACCINES  Aged Out    Health Maintenance  Health Maintenance  Due  Topic Date Due   COVID-19 Vaccine (1) Never done   TETANUS/TDAP  Never done   Zoster Vaccines- Shingrix (1 of 2) Never done   COLONOSCOPY (Pts 45-15yrs Insurance coverage will need to be confirmed)  Never done   INFLUENZA VACCINE  Never done    Colorectal cancer screening: No longer required.   Mammogram status: No longer required due to age.  Bone Density status: Completed 12/26/20. Results reflect: Bone density results: NORMAL. Repeat every 5 years.  Lung Cancer Screening: (Low Dose CT Chest recommended if Age 43-80 years, 30 pack-year currently smoking OR have quit w/in 15years.) does not qualify.   Lung Cancer Screening Referral: n/a  Additional Screening:  Hepatitis C Screening: does qualify; Completed 12/26/20  Vision Screening: Recommended annual ophthalmology exams for early detection of glaucoma and other disorders of the eye. Is the patient up to date with their annual eye exam?  No  Who is the provider or what is the name of the office in which the patient attends annual eye exams?  If pt is not established with a provider, would they like to be referred to a provider to establish care? No .   Dental Screening: Recommended annual dental exams for proper oral hygiene  Community Resource Referral / Chronic Care Management: CRR required this visit?  No   CCM required this visit?  No      Plan:     I have personally reviewed and noted the following in the patient's chart:   Medical and social history Use of alcohol, tobacco or illicit drugs  Current medications and supplements including opioid prescriptions. Patient is not currently taking opioid prescriptions. Functional ability and status Nutritional status Physical activity Advanced directives List of other physicians Hospitalizations, surgeries, and ER visits in previous 12 months Vitals Screenings to include cognitive, depression, and falls Referrals and appointments  In addition, I have  reviewed and discussed with patient certain preventive protocols, quality metrics, and best practice recommendations. A written personalized care plan for preventive services as well as general preventive health recommendations were provided to patient.     Laurie Wallace, CMA   05/07/2021   Nurse Notes: This was a telehealth visit. The patient was  at home. The provider was in the office and was Ihor Dow, MD.

## 2021-06-06 ENCOUNTER — Encounter: Payer: Medicare Other | Admitting: Internal Medicine

## 2021-07-07 NOTE — Progress Notes (Deleted)
Office Visit Note  Patient: Laurie Wallace             Date of Birth: August 13, 1945           MRN: [id number]             PCP: Lindell Spar, MD Referring: Lindell Spar, MD Visit Date: 07/08/2021   Subjective:  No chief complaint on file.   History of Present Illness: Laurie Wallace is a 76 y.o. female here for follow up for seropositive RA on methotrexate increased to 20 mg PO weekly and folic acid 1 mg daily.***   Previous HPI 04/07/21 Laurie Wallace is a 76 y.o. female here for follow up for seropositive RA after starting methotrexate 15 mg PO weekly and folic acid 1 mg daily. She noticed no problems with this and was doing well until about 1 week ago bilateral shoulder pain became much worse. She also noticed pain in right neck and base of skull, headaches going to above the ear, and pain at the right collar bone. No preceding event or changes although she continues working a lot. She stopped cymbalta without noticing any changes. She is not at all interested in injection treatments, says past shoulder injection helped for a few months.   Previous HPI  12/11/20 Laurie Wallace is a 76 y.o. female here for evaluation of joint pains in multiple areas with elevated inflammatory markers.  She believes these symptoms in her shoulder started since about 4 5 months ago without any specific preceding injury or event that she can recall.  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  She does not recall any past significant shoulder injuries or surgeries.  Previous treatments tried include cymbalta that she does not notice a significant improvement.  Since last month she was started on oral daily prednisone for the past few weeks and has noticed a significant improvement in her joint symptoms particularly resolution of the hand swelling although does continue having  some left shoulder pain.     No Rheumatology ROS completed.   PMFS History:  Patient Active Problem List   Diagnosis Date Noted   Muscle cramps 02/28/2021   High risk medication use 12/26/2020   Pain in left shoulder 12/11/2020   Pain in left hand 12/11/2020   Weight loss 10/25/2020   Seropositive rheumatoid arthritis ([place]) 10/25/2020   Moderate protein-calorie malnutrition ([place]) 10/25/2020   Cervical spine pain 10/23/2020   Lumbar pain 10/23/2020   Other intervertebral disc displacement, lumbar region 04/07/2010    Past Medical History:  Diagnosis Date   Allergy    Arthritis    COPD (chronic obstructive pulmonary disease) ([place])     Family History  Problem Relation Age of Onset   Cancer Mother    Cancer Father    Heart failure Sister    COPD Sister    Dementia Sister    Past Surgical History:  Procedure Laterality Date   ABDOMINAL HYSTERECTOMY     TOE SURGERY Right    1st toe   Social History   Social History Narrative   Lives with her son   Right Handed   Drinks 1-2 cups caffeine daily in winter   Immunization History  Administered Date(s) Administered   PNEUMOCOCCAL CONJUGATE-20 02/28/2021     Objective: Vital Signs: There were no vitals taken for this visit.   Physical Exam  Musculoskeletal Exam: ***  CDAI Exam: CDAI Score: -- Patient Global: --; Provider Global: -- Swollen: --; Tender: -- Joint Exam 07/08/2021   No joint exam has been documented for this visit   There is currently no information documented on the homunculus. Go to the Rheumatology activity and complete the homunculus joint exam.  Investigation: No additional findings.  Imaging: No results found.  Recent Labs: Lab Results  Component Value Date   WBC 11.3 (H) 04/07/2021   HGB 12.6 04/07/2021   PLT 394 04/07/2021   NA 139 04/07/2021   K 4.7 04/07/2021   CL 103 04/07/2021   CO2 27 04/07/2021   GLUCOSE 86 04/07/2021   BUN 16 04/07/2021   CREATININE 0.77 04/07/2021    BILITOT 0.3 04/07/2021   ALKPHOS 126 (H) 10/17/2020   AST 15 04/07/2021   ALT 9 04/07/2021   PROT 7.1 04/07/2021   ALBUMIN 3.9 10/17/2020   CALCIUM 9.4 04/07/2021   GFRAA 88 09/28/2014    Speciality Comments: No specialty comments available.  Procedures:  No procedures performed Allergies: Patient has no known allergies.   Assessment / Plan:     Visit Diagnoses: No diagnosis found.  ***  Orders: No orders of the defined types were placed in this encounter.  No orders of the defined types were placed in this encounter.    Follow-Up Instructions: No follow-ups on file.   Collier Salina, MD  Note - This record has been created using [employer].  Chart creation errors have been sought, but may not always  have been located. Such creation errors do not reflect on  the standard of medical care.

## 2021-07-08 ENCOUNTER — Other Ambulatory Visit: Payer: Self-pay | Admitting: Internal Medicine

## 2021-07-08 ENCOUNTER — Ambulatory Visit: Payer: BC Managed Care – PPO | Admitting: Internal Medicine

## 2021-07-08 DIAGNOSIS — M059 Rheumatoid arthritis with rheumatoid factor, unspecified: Secondary | ICD-10-CM

## 2021-07-08 NOTE — Telephone Encounter (Addendum)
Next Visit: 07/08/2021  Last Visit: 04/07/2021  Last Fill: 04/17/2021  DX: Seropositive rheumatoid arthritis   Current Dose per phone note 04/07/2021: recommends she increase the methotrexate dose to 8 tablets per week instead of 6.   Labs: 04/07/2021 WBC 11.3, RDW 15.1, Neutro Abs 8,057  Okay to refill MTX?

## 2021-07-09 NOTE — Progress Notes (Signed)
Office Visit Note  Patient: Laurie Wallace             Date of Birth: 16-Oct-1945           MRN: [id number]             PCP: Lindell Spar, MD Referring: Lindell Spar, MD Visit Date: 07/10/2021   Subjective:  Follow-up (Doing good, improving)   History of Present Illness: Laurie Wallace is a 76 y.o. female here for follow up for seropositive RA on methotrexate 20 mg PO weekly which was increased after last visit.  She feels symptoms are overall improving she has 0 pain on some days occasionally swelling and stiffness affecting the left arm.  Left shoulder range of motion remains slightly reduced but with much less pain.  She denies any new side effects on increased methotrexate dose.  She has regained about 5 pounds since having some unintentional weight loss last year denies any GI complaints reports normal appetite.  Previous HPI 04/07/21 Laurie Wallace is a 76 y.o. female here for follow up for seropositive RA after starting methotrexate 15 mg PO weekly and folic acid 1 mg daily. She noticed no problems with this and was doing well until about 1 week ago bilateral shoulder pain became much worse. She also noticed pain in right neck and base of skull, headaches going to above the ear, and pain at the right collar bone. No preceding event or changes although she continues working a lot. She stopped cymbalta without noticing any changes. She is not at all interested in injection treatments, says past shoulder injection helped for a few months.    12/11/20 Laurie Wallace is a 76 y.o. female here for evaluation of joint pains in multiple areas with elevated inflammatory markers.  She believes these symptoms in her shoulder started since about 4 5 months ago without any specific preceding injury or event that she can recall.  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  She does not recall any past significant shoulder injuries or surgeries. Previous treatments tried include cymbalta that she does not notice a significant improvement.  Since last month she was started on oral daily prednisone for the past few weeks and has noticed a significant improvement in her joint symptoms particularly resolution of the hand swelling although does continue having some left shoulder pain.   LAbs reviewed 10/2020 ANA neg ACE neg RF neg ANCAs neg ESR 68 CRP 25 Alk phos 126 Plts 495 CK 55   Review of Systems  Constitutional:  Positive for fatigue.  HENT:  Positive for mouth dryness.   Eyes:  Negative for dryness.  Respiratory:  Negative for shortness of breath.   Cardiovascular:  Negative for swelling in legs/feet.  Gastrointestinal:  Negative for constipation.  Endocrine: Positive for excessive thirst.  Genitourinary:  Negative for difficulty urinating.  Musculoskeletal:  Positive for joint pain, joint pain, joint swelling and muscle weakness.  Skin:  Negative for rash.  Allergic/Immunologic: Negative for susceptible to infections.  Neurological:  Negative for numbness.  Hematological:  Negative for bruising/bleeding tendency.  Psychiatric/Behavioral:  Positive for sleep disturbance.    PMFS History:  Patient Active Problem List   Diagnosis Date Noted   Muscle cramps 02/28/2021   High risk medication use 12/26/2020   Pain in left shoulder 12/11/2020   Pain in left hand 12/11/2020  Weight loss 10/25/2020   Seropositive rheumatoid arthritis ([place]) 10/25/2020   Moderate protein-calorie malnutrition ([place]) 10/25/2020   Cervical spine pain 10/23/2020   Lumbar pain 10/23/2020   Other intervertebral disc displacement, lumbar region 04/07/2010    Past Medical History:  Diagnosis Date   Allergy    Arthritis    COPD (chronic obstructive pulmonary disease) ([place])     Family History  Problem Relation Age of Onset   Cancer Mother    Cancer Father     Heart failure Sister    COPD Sister    Dementia Sister    Past Surgical History:  Procedure Laterality Date   ABDOMINAL HYSTERECTOMY     TOE SURGERY Right    1st toe   Social History   Social History Narrative   Lives with her son   Right Handed   Drinks 1-2 cups caffeine daily in winter   Immunization History  Administered Date(s) Administered   PNEUMOCOCCAL CONJUGATE-20 02/28/2021     Objective: Vital Signs: BP 127/66 (BP Location: Left Arm, Patient Position: Sitting, Cuff Size: Normal)    Pulse 88    Resp 16    Ht 5' 6"  (1.676 m)    Wt 127 lb (57.6 kg)    BMI 20.50 kg/m    Physical Exam Eyes:     Conjunctiva/sclera: Conjunctivae normal.  Cardiovascular:     Rate and Rhythm: Normal rate and regular rhythm.  Pulmonary:     Effort: Pulmonary effort is normal.     Breath sounds: Normal breath sounds.  Musculoskeletal:     Right lower leg: No edema.     Left lower leg: No edema.  Skin:    General: Skin is warm and dry.  Neurological:     Mental Status: She is alert.  Psychiatric:        Mood and Affect: Mood normal.     Musculoskeletal Exam:  Shoulders right full ROM left slightly decreased external rotation and abduction Elbows full ROM no tenderness or swelling Wrists full ROM no tenderness or swelling Fingers heberdon's nodes and z shaped thumb deformity, no tenderness or swelling Knees full ROM no tenderness or swelling Ankles full ROM no tenderness or swelling   CDAI Exam: CDAI Score: 1  Patient Global: 0 mm; Provider Global: 10 mm Swollen: 0 ; Tender: 0  Joint Exam 07/10/2021   All documented joints were normal     Investigation: No additional findings.  Imaging: No results found.  Recent Labs: Lab Results  Component Value Date   WBC 11.3 (H) 04/07/2021   HGB 12.6 04/07/2021   PLT 394 04/07/2021   NA 139 04/07/2021   K 4.7 04/07/2021   CL 103 04/07/2021   CO2 27 04/07/2021   GLUCOSE 86 04/07/2021   BUN 16 04/07/2021   CREATININE  0.77 04/07/2021   BILITOT 0.3 04/07/2021   ALKPHOS 126 (H) 10/17/2020   AST 15 04/07/2021   ALT 9 04/07/2021   PROT 7.1 04/07/2021   ALBUMIN 3.9 10/17/2020   CALCIUM 9.4 04/07/2021   GFRAA 88 09/28/2014    Speciality Comments: No specialty comments available.  Procedures:  No procedures performed Allergies: Patient has no known allergies.   Assessment / Plan:     Visit Diagnoses: Seropositive rheumatoid arthritis ([place]) - Plan: Sedimentation rate  Symptoms appear to be very well controlled today.  We will recheck sedimentation rate for activity monitoring it was moderately elevated at our last visit.  Plan to continue methotrexate 20 mg p.o.  weekly and folic acid 1 mg daily.  High risk medication use - Plan: CBC with Differential/Platelet, COMPLETE METABOLIC PANEL WITH GFR  Checking CBC and CMP today for methotrexate toxicity monitoring.  Moderate protein-calorie malnutrition ([place])  Weight slightly improved compared to last year does not seem to be having any GI intolerance with increased medication dose. No new systemic symptoms reported.  Chronic left shoulder pain  Ongoing left shoulder symptoms with restricted range of motion does not appear to be active inflammation at this time.  Overall doing well without much complaints she is mostly only having pain when overexerting herself.  Orders: Orders Placed This Encounter  Procedures   CBC with Differential/Platelet   COMPLETE METABOLIC PANEL WITH GFR   Sedimentation rate   No orders of the defined types were placed in this encounter.    Follow-Up Instructions: Return in about 3 months (around 10/08/2021) for RA on MTX f/u 4mo.   CCollier Salina MD  Note - This record has been created using [employer]  Chart creation errors have been sought, but may not always  have been located. Such creation errors do not reflect on  the standard of medical care.

## 2021-07-09 NOTE — Telephone Encounter (Signed)
We need to reschedule her missed appointment or at least repeat CBC and CMP in order to refill this.

## 2021-07-09 NOTE — Telephone Encounter (Signed)
Spoke with patient and rescheduled her follow up visit for 07/10/2021 at 11:40 am. Patient thought her appointment was scheduled for 07/17/2021 so she was unaware of her appointment she missed.

## 2021-07-10 ENCOUNTER — Encounter: Payer: Self-pay | Admitting: Internal Medicine

## 2021-07-10 ENCOUNTER — Other Ambulatory Visit: Payer: Self-pay

## 2021-07-10 ENCOUNTER — Ambulatory Visit (INDEPENDENT_AMBULATORY_CARE_PROVIDER_SITE_OTHER): Payer: Medicare Other | Admitting: Internal Medicine

## 2021-07-10 VITALS — BP 127/66 | HR 88 | Resp 16 | Ht 66.0 in | Wt 127.0 lb

## 2021-07-10 DIAGNOSIS — G8929 Other chronic pain: Secondary | ICD-10-CM | POA: Diagnosis not present

## 2021-07-10 DIAGNOSIS — M25512 Pain in left shoulder: Secondary | ICD-10-CM

## 2021-07-10 DIAGNOSIS — M059 Rheumatoid arthritis with rheumatoid factor, unspecified: Secondary | ICD-10-CM

## 2021-07-10 DIAGNOSIS — Z79899 Other long term (current) drug therapy: Secondary | ICD-10-CM | POA: Diagnosis not present

## 2021-07-10 DIAGNOSIS — E440 Moderate protein-calorie malnutrition: Secondary | ICD-10-CM

## 2021-07-11 LAB — CBC WITH DIFFERENTIAL/PLATELET
Absolute Monocytes: 370 cells/uL (ref 200–950)
Basophils Absolute: 53 cells/uL (ref 0–200)
Basophils Relative: 0.4 %
Eosinophils Absolute: 224 cells/uL (ref 15–500)
Eosinophils Relative: 1.7 %
HCT: 39 % (ref 35.0–45.0)
Hemoglobin: 12.9 g/dL (ref 11.7–15.5)
Lymphs Abs: 1822 cells/uL (ref 850–3900)
MCH: 30.1 pg (ref 27.0–33.0)
MCHC: 33.1 g/dL (ref 32.0–36.0)
MCV: 91.1 fL (ref 80.0–100.0)
MPV: 10.5 fL (ref 7.5–12.5)
Monocytes Relative: 2.8 %
Neutro Abs: 10732 cells/uL — ABNORMAL HIGH (ref 1500–7800)
Neutrophils Relative %: 81.3 %
Platelets: 356 10*3/uL (ref 140–400)
RBC: 4.28 10*6/uL (ref 3.80–5.10)
RDW: 15.6 % — ABNORMAL HIGH (ref 11.0–15.0)
Total Lymphocyte: 13.8 %
WBC: 13.2 10*3/uL — ABNORMAL HIGH (ref 3.8–10.8)

## 2021-07-11 LAB — COMPLETE METABOLIC PANEL WITH GFR
AG Ratio: 1.3 (calc) (ref 1.0–2.5)
ALT: 15 U/L (ref 6–29)
AST: 18 U/L (ref 10–35)
Albumin: 3.9 g/dL (ref 3.6–5.1)
Alkaline phosphatase (APISO): 102 U/L (ref 37–153)
BUN: 14 mg/dL (ref 7–25)
CO2: 29 mmol/L (ref 20–32)
Calcium: 9.5 mg/dL (ref 8.6–10.4)
Chloride: 104 mmol/L (ref 98–110)
Creat: 0.76 mg/dL (ref 0.60–1.00)
Globulin: 3 g/dL (calc) (ref 1.9–3.7)
Glucose, Bld: 95 mg/dL (ref 65–99)
Potassium: 5 mmol/L (ref 3.5–5.3)
Sodium: 141 mmol/L (ref 135–146)
Total Bilirubin: 0.3 mg/dL (ref 0.2–1.2)
Total Protein: 6.9 g/dL (ref 6.1–8.1)
eGFR: 82 mL/min/{1.73_m2} (ref >=60)

## 2021-07-11 LAB — SEDIMENTATION RATE: Sed Rate: 14 mm/h (ref 0–30)

## 2021-07-11 NOTE — Progress Notes (Signed)
Lab results for methotrexate monitoring all look fine. Her sedimentation rate test is back down to normal which also indicates inflammation is less.

## 2021-07-14 DIAGNOSIS — D3132 Benign neoplasm of left choroid: Secondary | ICD-10-CM | POA: Diagnosis not present

## 2021-07-14 DIAGNOSIS — H5203 Hypermetropia, bilateral: Secondary | ICD-10-CM | POA: Diagnosis not present

## 2021-07-14 DIAGNOSIS — H25813 Combined forms of age-related cataract, bilateral: Secondary | ICD-10-CM | POA: Diagnosis not present

## 2021-07-17 ENCOUNTER — Ambulatory Visit (INDEPENDENT_AMBULATORY_CARE_PROVIDER_SITE_OTHER): Payer: Medicare Other | Admitting: Internal Medicine

## 2021-07-17 ENCOUNTER — Encounter: Payer: Self-pay | Admitting: Internal Medicine

## 2021-07-17 ENCOUNTER — Other Ambulatory Visit: Payer: Self-pay

## 2021-07-17 VITALS — BP 138/68 | HR 69 | Resp 18 | Ht 66.0 in | Wt 130.0 lb

## 2021-07-17 DIAGNOSIS — Z0001 Encounter for general adult medical examination with abnormal findings: Secondary | ICD-10-CM | POA: Insufficient documentation

## 2021-07-17 DIAGNOSIS — E559 Vitamin D deficiency, unspecified: Secondary | ICD-10-CM

## 2021-07-17 DIAGNOSIS — Z2821 Immunization not carried out because of patient refusal: Secondary | ICD-10-CM

## 2021-07-17 DIAGNOSIS — M810 Age-related osteoporosis without current pathological fracture: Secondary | ICD-10-CM

## 2021-07-17 DIAGNOSIS — E440 Moderate protein-calorie malnutrition: Secondary | ICD-10-CM | POA: Diagnosis not present

## 2021-07-17 NOTE — Progress Notes (Signed)
Established Patient Office Visit  Subjective:  Patient ID: Laurie Wallace, female    DOB: 1946/01/27  Age: 76 y.o. MRN: [id number]  CC:  Chief Complaint  Patient presents with   Annual Exam    Annual exam     HPI Laurie Wallace is a 76 y.o. female with past medical history of RA and osteoporosis who presents for annual physical.  She is on Methotrexate for RA and has been feeling better now. She has not required Prednisone recently. She is followed by Rheumatology. Last blood tests were reviewed.  She has gained about 10 pounds since the last visit. Has been eating at regular intervals. Denies any night sweats, cough or dyspnea.  She refused flu vaccine in the office today.  Past Medical History:  Diagnosis Date   Allergy    Arthritis    COPD (chronic obstructive pulmonary disease) ([facility])     Past Surgical History:  Procedure Laterality Date   ABDOMINAL HYSTERECTOMY     TOE SURGERY Right    1st toe    Family History  Problem Relation Age of Onset   Cancer Mother    Cancer Father    Heart failure Sister    COPD Sister    Dementia Sister     Social History   Socioeconomic History   Marital status: Single    Spouse name: Not on file   Number of children: Not on file   Years of education: Not on file   Highest education level: Not on file  Occupational History   Occupation: full time  Tobacco Use   Smoking status: Never   Smokeless tobacco: Never  Vaping Use   Vaping Use: Never used  Substance and Sexual Activity   Alcohol use: No   Drug use: No   Sexual activity: Not on file  Other Topics Concern   Not on file  Social History Narrative   Lives with her son   Right Handed   Drinks 1-2 cups caffeine daily in winter   Social Determinants of Health   Financial Resource Strain: Low Risk    Difficulty of Paying Living Expenses: Not hard at all  Food Insecurity: No Food Insecurity   Worried About Charity fundraiser in the Last  Year: Never true   Arboriculturist in the Last Year: Never true  Transportation Needs: No Transportation Needs   Lack of Transportation (Medical): No   Lack of Transportation (Non-Medical): No  Physical Activity: Sufficiently Active   Days of Exercise per Week: 5 days   Minutes of Exercise per Session: 30 min  Stress: No Stress Concern Present   Feeling of Stress : Not at all  Social Connections: Moderately Isolated   Frequency of Communication with Friends and Family: More than three times a week   Frequency of Social Gatherings with Friends and Family: Three times a week   Attends Religious Services: More than 4 times per year   Active Member of Clubs or Organizations: No   Attends Archivist Meetings: Never   Marital Status: Widowed  Human resources officer Violence: Not At Risk   Fear of Current or Ex-Partner: No   Emotionally Abused: No   Physically Abused: No   Sexually Abused: No    Outpatient Medications Prior to Visit  Medication Sig Dispense Refill   alendronate (FOSAMAX) 70 MG tablet Take 1 tablet (70 mg total) by mouth every 7 (seven) days. Take with a full glass of water  on an empty stomach. 4 tablet 11   Calcium Carbonate-Vit D-Min (CALTRATE 600+D PLUS MINERALS) 600-800 MG-UNIT CHEW Chew 1 tablet by mouth daily. 30 tablet 5   cyclobenzaprine (FLEXERIL) 5 MG tablet Take 1 tablet (5 mg total) by mouth 3 (three) times daily as needed for muscle spasms. 30 tablet 1   folic acid (FOLVITE) 1 MG tablet Take 1 tablet (1 mg total) by mouth daily. 90 tablet 0   levocetirizine (XYZAL) 5 MG tablet   5   methotrexate (RHEUMATREX) 2.5 MG tablet TAKE 8 TABLETS (20 MG TOTAL) BY MOUTH ONCE A WEEK. CAUTION:CHEMOTHERAPY. PROTECT FROM LIGHT. 96 tablet 0   No facility-administered medications prior to visit.    No Known Allergies  ROS Review of Systems  Constitutional:  Negative for chills and fever.  HENT:  Negative for congestion, sinus pressure, sinus pain and sore throat.    Eyes:  Negative for pain and discharge.  Respiratory:  Negative for cough and shortness of breath.   Cardiovascular:  Negative for chest pain and palpitations.  Gastrointestinal:  Negative for abdominal pain, constipation, diarrhea, nausea and vomiting.  Endocrine: Negative for polydipsia and polyuria.  Genitourinary:  Negative for dysuria and hematuria.  Musculoskeletal:  Positive for arthralgias, back pain and myalgias. Negative for neck pain and neck stiffness.  Skin:  Negative for rash.  Neurological:  Negative for dizziness and weakness.  Psychiatric/Behavioral:  Negative for agitation and behavioral problems.      Objective:    Physical Exam Vitals reviewed.  Constitutional:      General: She is not in acute distress.    Appearance: She is not diaphoretic.  HENT:     Head: Normocephalic and atraumatic.     Nose: Nose normal.     Mouth/Throat:     Mouth: Mucous membranes are moist.  Eyes:     General: No scleral icterus.    Extraocular Movements: Extraocular movements intact.  Cardiovascular:     Rate and Rhythm: Normal rate and regular rhythm.     Pulses: Normal pulses.     Heart sounds: Normal heart sounds. No murmur heard. Pulmonary:     Breath sounds: Normal breath sounds. No wheezing or rales.  Abdominal:     Palpations: Abdomen is soft.     Tenderness: There is no abdominal tenderness.  Musculoskeletal:        General: Deformity (Small joints of b/l hands with mild ulnar deviation) present.     Cervical back: Neck supple. No tenderness.     Right lower leg: No edema.     Left lower leg: No edema.  Skin:    General: Skin is warm.     Findings: No rash.  Neurological:     General: No focal deficit present.     Mental Status: She is alert and oriented to person, place, and time.     Cranial Nerves: No cranial nerve deficit.     Sensory: No sensory deficit.     Motor: No weakness.  Psychiatric:        Mood and Affect: Mood normal.        Behavior: Behavior  normal.    BP 138/68 (BP Location: Right Arm, Patient Position: Sitting, Cuff Size: Normal)    Pulse 69    Resp 18    Ht 5' 6"  (1.676 m)    Wt 130 lb (59 kg)    SpO2 99%    BMI 20.98 kg/m  Wt Readings from Last 3 Encounters:  07/17/21 130 lb (59 kg)  07/10/21 127 lb (57.6 kg)  04/07/21 121 lb (54.9 kg)    Lab Results  Component Value Date   TSH 1.950 10/17/2020   Lab Results  Component Value Date   WBC 13.2 (H) 07/10/2021   HGB 12.9 07/10/2021   HCT 39.0 07/10/2021   MCV 91.1 07/10/2021   PLT 356 07/10/2021   Lab Results  Component Value Date   NA 141 07/10/2021   K 5.0 07/10/2021   CO2 29 07/10/2021   GLUCOSE 95 07/10/2021   BUN 14 07/10/2021   CREATININE 0.76 07/10/2021   BILITOT 0.3 07/10/2021   ALKPHOS 126 (H) 10/17/2020   AST 18 07/10/2021   ALT 15 07/10/2021   PROT 6.9 07/10/2021   ALBUMIN 3.9 10/17/2020   CALCIUM 9.5 07/10/2021   EGFR 82 07/10/2021   No results found for: CHOL No results found for: HDL No results found for: LDLCALC No results found for: TRIG No results found for: CHOLHDL No results found for: HGBA1C    Assessment & Plan:   Problem List Items Addressed This Visit       Musculoskeletal and Integument   Age-related osteoporosis without current pathological fracture    DEXA scan reviewed Was placed on alendronate, she needs to get refills from pharmacy. Continue Caltrate 600 + D3        Other   Moderate protein-calorie malnutrition ([place])    Has gained about 10 lbs since last visit On advised to avoid skipping any meal Ensure supplements Increase fluid intake for proper hydration Avoid over-exertion at work      Encounter for general adult medical examination with abnormal findings - Primary    Physical exam as documented. Counseling done  re healthy lifestyle involving commitment to 150 minutes exercise per week, heart healthy diet, and attaining healthy weight.The importance of adequate sleep also discussed. Changes in  health habits are decided on by the patient with goals and time frames  set for achieving them. Immunization and cancer screening needs are specifically addressed at this visit.  Has not sent cologuard yet. Refused flu vaccine. Advised to get Shingrix vaccine at local pharmacy.      Relevant Orders   TSH   Lipid panel   Hemoglobin A1c   CMP14+EGFR   CBC with Differential/Platelet   Other Visit Diagnoses     Influenza vaccine refused       Vitamin D deficiency       Relevant Orders   VITAMIN D 25 Hydroxy (Vit-D Deficiency, Fractures)       No orders of the defined types were placed in this encounter.   Follow-up: Return in about 6 months (around 01/14/2022) for Osteoporosis and RA.    Lindell Spar, MD

## 2021-07-17 NOTE — Assessment & Plan Note (Signed)
Physical exam as documented. Counseling done  re healthy lifestyle involving commitment to 150 minutes exercise per week, heart healthy diet, and attaining healthy weight.The importance of adequate sleep also discussed. Changes in health habits are decided on by the patient with goals and time frames  set for achieving them. Immunization and cancer screening needs are specifically addressed at this visit.  Has not sent cologuard yet. Refused flu vaccine. Advised to get Shingrix vaccine at local pharmacy.

## 2021-07-17 NOTE — Assessment & Plan Note (Signed)
Has gained about 10 lbs since last visit On advised to avoid skipping any meal Ensure supplements Increase fluid intake for proper hydration Avoid over-exertion at work

## 2021-07-17 NOTE — Patient Instructions (Signed)
Please continue taking medications as prescribed.  Please get fasting blood tests done before the next visit.  Please consider getting Shingrix vaccine at your local pharmacy.

## 2021-07-17 NOTE — Assessment & Plan Note (Signed)
DEXA scan reviewed Was placed on alendronate, she needs to get refills from pharmacy. Continue Caltrate 600 + D3 

## 2021-07-29 ENCOUNTER — Other Ambulatory Visit: Payer: Self-pay

## 2021-07-29 NOTE — Telephone Encounter (Signed)
Next Visit: 10/13/2021  Last Visit: 07/10/2021  Last Fill:  Dx: Seropositive rheumatoid arthritis   Current Dose per office note on 07/10/2020: not discussed   Okay to refill Prednisone?

## 2021-07-29 NOTE — Telephone Encounter (Signed)
Patient called requesting prescription refill of Prednisone 10 mg to be sent to [facility] at [address] in [place].

## 2021-08-02 ENCOUNTER — Other Ambulatory Visit: Payer: Self-pay | Admitting: Internal Medicine

## 2021-08-02 DIAGNOSIS — M130 Polyarthritis, unspecified: Secondary | ICD-10-CM

## 2021-09-09 ENCOUNTER — Other Ambulatory Visit: Payer: Self-pay

## 2021-09-09 ENCOUNTER — Encounter: Payer: Self-pay | Admitting: Nurse Practitioner

## 2021-09-09 ENCOUNTER — Ambulatory Visit (INDEPENDENT_AMBULATORY_CARE_PROVIDER_SITE_OTHER): Payer: Medicare Other | Admitting: Nurse Practitioner

## 2021-09-09 DIAGNOSIS — U071 COVID-19: Secondary | ICD-10-CM

## 2021-09-09 MED ORDER — NIRMATRELVIR/RITONAVIR (PAXLOVID)TABLET: 3.0000 | ORAL_TABLET | Freq: Two times a day (BID) | ORAL | 0 refills | Status: AC

## 2021-09-09 MED ORDER — BENZONATATE 100 MG PO CAPS: 100.0000 mg | ORAL_CAPSULE | Freq: Two times a day (BID) | ORAL | 0 refills | Status: DC | PRN

## 2021-09-09 NOTE — Progress Notes (Signed)
Virtual Visit via Telephone Note ? ?I connected with Golden Pop on 09/09/21 at 8:41am by telephone and verified that I am speaking with the correct person using two identifiers. ?I spent 7 minutes on this telephone encounter.  ?Location: ?Patient: home ?Provider: office ?  ?I discussed the limitations, risks, security and privacy concerns of performing an evaluation and management service by telephone and the availability of in person appointments. I also discussed with the patient that there may be a patient responsible charge related to this service. The patient expressed understanding and agreed to proceed. ? ? ?History of Present Illness: ?Pt with history of RA c/o cold, cough, running nose, that's started 4 days ago.she tested positive for covid at home on 2 days ago.  Pt denies fever, body aches wheezing, cp, bloody sputum. Pt stated that her grand daughter recently had covid as well. Pt stated  that she has not been vaccinated against COVID.  ?  ?Observations/Objective: ? ? ?Assessment and Plan: ?Rx Paxlovid   20x'150mg'$ &10x'100mg'$  tablets.Take 3 tablets by mouth 2 (two) times daily for 5 days. (Take nirmatrelvir 150 mg two tablets twice daily for 5 days and ritonavir 100 mg one tablet twice daily for 5 days) Patient GFR is 82 ? ?Rx Tessalon 100 mg twice daily PRN cough ?Stay on isolation for 5 days, take Tylenol 650 mg every 6 hours as needed for fever. ?Patient encouraged to get her COVID vaccine when she fully recovers, she verbalized understanding. ? ?Follow Up Instructions: ? ?  ?I discussed the assessment and treatment plan with the patient. The patient was provided an opportunity to ask questions and all were answered. The patient agreed with the plan and demonstrated an understanding of the instructions. ?  ?The patient was advised to call back or seek an in-person evaluation if the symptoms worsen or if the condition fails to improve as anticipated.  ?

## 2021-09-09 NOTE — Assessment & Plan Note (Signed)
Rx Paxlovid   20x'150mg'$ &10x'100mg'$  tablets.Take 3 tablets by mouth 2 (two) times daily for 5 days. (Take nirmatrelvir 150 mg two tablets twice daily for 5 days and ritonavir 100 mg one tablet twice daily for 5 days) Patient GFR is 82 ?Rx Tessalon 100 mg twice daily PRN cough  ?Stay on isolation for 5 days, take Tylenol 650 mg every 6 hours as needed for fever. ?Patient encouraged to get her COVID vaccine when she fully recovers, she verbalized understanding. ? ?

## 2021-10-11 NOTE — Progress Notes (Signed)
? ?Office Visit Note ? ?Patient: Laurie Wallace             ?Date of Birth: 1945/09/24           ?MRN: [id number]             ?PCP: Lindell Spar, MD ?Referring: Lindell Spar, MD ?Visit Date: 10/13/2021 ? ? ?Subjective:  ?Arthritis (Doing good) ? ? ?History of Present Illness: Laurie Wallace is a 76 y.o. female here for follow up for seropositive RA on methotrexate 20 mg PO weekly. She is doing well since last visit her shoulder pain increases on days with poor weather or if she uses her arms a lot more than usual. Otherwise pain free on some days. She had COVID infection last month treated with paxlovid and tessalon and tylenol with good improvement of symptoms.  ? ?Previous HPI ?07/10/21 ?Laurie Wallace is a 76 y.o. female here for follow up for seropositive RA on methotrexate 20 mg PO weekly which was increased after last visit.  She feels symptoms are overall improving she has 0 pain on some days occasionally swelling and stiffness affecting the left arm.  Left shoulder range of motion remains slightly reduced but with much less pain.  She denies any new side effects on increased methotrexate dose.  She has regained about 5 pounds since having some unintentional weight loss last year denies any GI complaints reports normal appetite. ?  ?Previous HPI   ?12/11/20 ?Laurie Wallace is a 76 y.o. female here for evaluation of joint pains in multiple areas with elevated inflammatory markers.  She believes these symptoms in her shoulder started since about 4 5 months ago without any specific preceding injury or event that she can recall.  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  She does not recall any past significant shoulder injuries or surgeries. Previous treatments tried include cymbalta that she does not notice a significant improvement.  Since last month she was started on  oral daily prednisone for the past few weeks and has noticed a significant improvement in her joint symptoms particularly resolution of the hand swelling although does continue having some left shoulder pain. ?  ?LAbs reviewed ?10/2020 ?ANA neg ?ACE neg ?RF neg ?ANCAs neg ?ESR 68 ?CRP 25 ?Alk phos 126 ?Plts 495 ?CK 55 ? ? ?Review of Systems  ?Constitutional:  Positive for fatigue.  ?HENT:  Positive for mouth dryness.   ?Eyes:  Negative for dryness.  ?Respiratory:  Negative for shortness of breath.   ?Cardiovascular:  Negative for swelling in legs/feet.  ?Gastrointestinal:  Positive for constipation.  ?Endocrine: Positive for cold intolerance, heat intolerance and excessive thirst.  ?Genitourinary:  Negative for difficulty urinating.  ?Musculoskeletal:  Positive for joint pain, joint pain, muscle weakness and morning stiffness.  ?Skin:  Negative for rash.  ?Allergic/Immunologic: Negative for susceptible to infections.  ?Neurological:  Positive for weakness.  ?Hematological:  Negative for bruising/bleeding tendency.  ?Psychiatric/Behavioral:  Positive for sleep disturbance.   ? ?PMFS History:  ?Patient Active Problem List  ? Diagnosis Date Noted  ? COVID-19 virus infection 09/09/2021  ? Encounter for general adult medical examination with abnormal findings 07/17/2021  ? Age-related osteoporosis without current pathological fracture 07/17/2021  ? Muscle cramps 02/28/2021  ? High risk medication use 12/26/2020  ? Pain in left shoulder 12/11/2020  ? Pain in left hand 12/11/2020  ? Seropositive  rheumatoid arthritis ([place]) 10/25/2020  ? Moderate protein-calorie malnutrition ([place]) 10/25/2020  ? Cervical spine pain 10/23/2020  ? Lumbar pain 10/23/2020  ? Other intervertebral disc displacement, lumbar region 04/07/2010  ?  ?Past Medical History:  ?Diagnosis Date  ? Allergy   ? Arthritis   ? COPD (chronic obstructive pulmonary disease) ([place])   ?  ?Family History  ?Problem Relation Age of Onset  ? Cancer Mother   ? Cancer Father    ? Heart failure Sister   ? COPD Sister   ? Dementia Sister   ? ?Past Surgical History:  ?Procedure Laterality Date  ? ABDOMINAL HYSTERECTOMY    ? TOE SURGERY Right   ? 1st toe  ? ?Social History  ? ?Social History Narrative  ? Lives with her son  ? Right Handed  ? Drinks 1-2 cups caffeine daily in winter  ? ?Immunization History  ?Administered Date(s) Administered  ? PNEUMOCOCCAL CONJUGATE-20 02/28/2021  ?  ? ?Objective: ?Vital Signs: BP 122/65 (BP Location: Left Arm, Patient Position: Sitting, Cuff Size: Normal)   Pulse (!) 101   Resp 15   Ht _0  (1.676 m)   Wt 124 lb (56.2 kg)   BMI 20.01 kg/m?   ? ?Physical Exam ?Eyes:  ?   Conjunctiva/sclera: Conjunctivae normal.  ?Cardiovascular:  ?   Rate and Rhythm: Normal rate and regular rhythm.  ?Pulmonary:  ?   Effort: Pulmonary effort is normal.  ?   Breath sounds: Normal breath sounds.  ?Musculoskeletal:  ?   Right lower leg: No edema.  ?   Left lower leg: No edema.  ?Skin: ?   General: Skin is warm and dry.  ?   Findings: No rash.  ?Neurological:  ?   Mental Status: She is alert.  ?Psychiatric:     ?   Mood and Affect: Mood normal.  ?  ? ?Musculoskeletal Exam:  ?Shoulders partially decreased abduction and external rotation on both sides ?Elbows full ROM no tenderness or swelling ?Wrists full ROM no tenderness or swelling ?Bilateral heberdon's nodes in both hands, z-shape thumb deformities on both hands without much tenderness to pressure ?Fingers full ROM no tenderness or swelling ?Knees full ROM no tenderness or swelling ? ? ?CDAI Exam: ?CDAI Score: 2  ?Patient Global: 10 mm; Provider Global: 10 mm ?Swollen: 0 ; Tender: 0  ?Joint Exam 10/13/2021  ? ?All documented joints were normal  ? ? ? ?Investigation: ?No additional findings. ? ?Imaging: ?No results found. ? ?Recent Labs: ?Lab Results  ?Component Value Date  ? WBC 13.2 (H) 07/10/2021  ? HGB 12.9 07/10/2021  ? PLT 356 07/10/2021  ? NA 141 07/10/2021  ? K 5.0 07/10/2021  ? CL 104 07/10/2021  ? CO2 29  07/10/2021  ? GLUCOSE 95 07/10/2021  ? BUN 14 07/10/2021  ? CREATININE 0.76 07/10/2021  ? BILITOT 0.3 07/10/2021  ? ALKPHOS 126 (H) 10/17/2020  ? AST 18 07/10/2021  ? ALT 15 07/10/2021  ? PROT 6.9 07/10/2021  ? ALBUMIN 3.9 10/17/2020  ? CALCIUM 9.5 07/10/2021  ? GFRAA 88 09/28/2014  ? ? ?Speciality Comments: No specialty comments available. ? ?Procedures:  ?No procedures performed ?Allergies: Patient has no known allergies.  ? ?Assessment / Plan:     ?Visit Diagnoses: Seropositive rheumatoid arthritis ([place]) - Plan: Sedimentation rate ? ?Appears to be very well controlled today she has minimal daily symptoms worst joints are the shoulders more affected with temperature and use related probably from more osteoarthritis.  Checking sed rate  for disease activity monitoring.  Plan to continue the methotrexate 20 mg p.o. weekly with folic acid 1 mg daily. ? ?High risk medication use - Plan: CBC with Differential/Platelet, COMPLETE METABOLIC PANEL WITH GFR ? ?Checking CBC and CMP for methotrexate toxicity monitoring.  She had interval COVID infection I do not suspect is related to her medications.  She was recommended to get COVID immunization by PCP if doing so would recommend holding methotrexate during 2 weeks immediately following vaccination. ? ?Orders: ?Orders Placed This Encounter  ?Procedures  ? Sedimentation rate  ? CBC with Differential/Platelet  ? COMPLETE METABOLIC PANEL WITH GFR  ? ?Meds ordered this encounter  ?Medications  ? folic acid (FOLVITE) 1 MG tablet  ?  Sig: Take 1 tablet (1 mg total) by mouth daily.  ?  Dispense:  90 tablet  ?  Refill:  3  ? methotrexate (RHEUMATREX) 2.5 MG tablet  ?  Sig: Take 8 tablets (20 mg total) by mouth once a week. Caution:Chemotherapy. Protect from light.  ?  Dispense:  104 tablet  ?  Refill:  0  ? ? ? ?Follow-Up Instructions: Return in about 3 months (around 01/12/2022) for RA on MTX f/u 54mo. ? ? ?CCollier Salina MD ? ?Note - This record has been created using [employer]  ?Chart creation errors have been sought, but may not always  ?have been located. Such creation errors do not reflect on  ?the standard of medical care. ? ?

## 2021-10-13 ENCOUNTER — Encounter: Payer: Self-pay | Admitting: Internal Medicine

## 2021-10-13 ENCOUNTER — Ambulatory Visit: Payer: Medicare Other | Admitting: Internal Medicine

## 2021-10-13 VITALS — BP 122/65 | HR 101 | Resp 15 | Ht 66.0 in | Wt 124.0 lb

## 2021-10-13 DIAGNOSIS — M059 Rheumatoid arthritis with rheumatoid factor, unspecified: Secondary | ICD-10-CM

## 2021-10-13 DIAGNOSIS — Z79899 Other long term (current) drug therapy: Secondary | ICD-10-CM

## 2021-10-13 MED ORDER — METHOTREXATE 2.5 MG PO TABS: 20.0000 mg | ORAL_TABLET | ORAL | 0 refills | Status: DC

## 2021-10-13 MED ORDER — FOLIC ACID 1 MG PO TABS: 1.0000 mg | ORAL_TABLET | Freq: Every day | ORAL | 3 refills | Status: DC

## 2021-10-14 LAB — CBC WITH DIFFERENTIAL/PLATELET
Absolute Monocytes: 458 cells/uL (ref 200–950)
Basophils Absolute: 52 cells/uL (ref 0–200)
Basophils Relative: 0.5 %
Eosinophils Absolute: 302 cells/uL (ref 15–500)
Eosinophils Relative: 2.9 %
HCT: 39 % (ref 35.0–45.0)
Hemoglobin: 13.1 g/dL (ref 11.7–15.5)
Lymphs Abs: 1924 cells/uL (ref 850–3900)
MCH: 30.3 pg (ref 27.0–33.0)
MCHC: 33.6 g/dL (ref 32.0–36.0)
MCV: 90.3 fL (ref 80.0–100.0)
MPV: 10.7 fL (ref 7.5–12.5)
Monocytes Relative: 4.4 %
Neutro Abs: 7665 cells/uL (ref 1500–7800)
Neutrophils Relative %: 73.7 %
Platelets: 381 10*3/uL (ref 140–400)
RBC: 4.32 10*6/uL (ref 3.80–5.10)
RDW: 15.1 % — ABNORMAL HIGH (ref 11.0–15.0)
Total Lymphocyte: 18.5 %
WBC: 10.4 10*3/uL (ref 3.8–10.8)

## 2021-10-14 LAB — COMPLETE METABOLIC PANEL WITH GFR
AG Ratio: 1.3 (calc) (ref 1.0–2.5)
ALT: 7 U/L (ref 6–29)
AST: 13 U/L (ref 10–35)
Albumin: 4 g/dL (ref 3.6–5.1)
Alkaline phosphatase (APISO): 103 U/L (ref 37–153)
BUN: 15 mg/dL (ref 7–25)
CO2: 28 mmol/L (ref 20–32)
Calcium: 9.2 mg/dL (ref 8.6–10.4)
Chloride: 105 mmol/L (ref 98–110)
Creat: 0.75 mg/dL (ref 0.60–1.00)
Globulin: 3.1 g/dL (calc) (ref 1.9–3.7)
Glucose, Bld: 100 mg/dL — ABNORMAL HIGH (ref 65–99)
Potassium: 4.9 mmol/L (ref 3.5–5.3)
Sodium: 140 mmol/L (ref 135–146)
Total Bilirubin: 0.2 mg/dL (ref 0.2–1.2)
Total Protein: 7.1 g/dL (ref 6.1–8.1)
eGFR: 83 mL/min/{1.73_m2} (ref >=60)

## 2021-10-14 LAB — SEDIMENTATION RATE: Sed Rate: 17 mm/h (ref 0–30)

## 2021-10-14 NOTE — Progress Notes (Signed)
Lab results look fine for blood count and liver and kidney function. Her sedimentation rate also looks good. No medication changes needed.

## 2021-11-18 ENCOUNTER — Telehealth: Payer: Self-pay | Admitting: Internal Medicine

## 2021-11-18 DIAGNOSIS — G8929 Other chronic pain: Secondary | ICD-10-CM

## 2021-11-18 DIAGNOSIS — M059 Rheumatoid arthritis with rheumatoid factor, unspecified: Secondary | ICD-10-CM

## 2021-11-18 DIAGNOSIS — Z79899 Other long term (current) drug therapy: Secondary | ICD-10-CM

## 2021-11-18 MED ORDER — PREDNISONE 10 MG PO TABS: ORAL_TABLET | ORAL | 0 refills | Status: AC

## 2021-11-18 NOTE — Telephone Encounter (Signed)
Patient called the office stating she is in a lot of pain and requests Prednisone be sent to [facility] in [place]. ?

## 2021-11-18 NOTE — Telephone Encounter (Signed)
I spoke with Laurie Wallace she is experiencing increased left shoulder pain again since waking up on Sunday.  She has been unable to go back to work missing work since Sunday evening due to this joint pain.  I will send a prescription for prednisone taper for 8 days starting 20 mg dose to her pharmacy for this.  I recommend if she does not improve with this medicine or symptoms come right back after completing she should contact us would need to see in the office for further planning.  She requests a note for return to work due to time missed with this symptom flareup, I recommended she contact us to update after her symptoms improve to be can account for the appropriate amount of time.

## 2021-11-24 ENCOUNTER — Telehealth: Payer: Self-pay | Admitting: Internal Medicine

## 2021-11-24 ENCOUNTER — Encounter: Payer: Self-pay | Admitting: *Deleted

## 2021-11-24 NOTE — Telephone Encounter (Signed)
Patient called stating Dr. Benjamine Mola told her to contact the office when she is feeling better and he would write a return to work note.  Patient states she wants to return to work today, 11/24/21.  Patient requested a return call when the note is ready and she will come by and pick it up.  [phone]

## 2021-11-24 NOTE — Telephone Encounter (Signed)
Letter written for patient and advised she may come by the office to pick it up.

## 2022-01-13 ENCOUNTER — Telehealth: Payer: Self-pay | Admitting: Internal Medicine

## 2022-01-13 NOTE — Telephone Encounter (Signed)
FMLA   Noted Copied  Sleeved

## 2022-01-14 ENCOUNTER — Encounter: Payer: Self-pay | Admitting: Internal Medicine

## 2022-01-14 ENCOUNTER — Ambulatory Visit (INDEPENDENT_AMBULATORY_CARE_PROVIDER_SITE_OTHER): Payer: Medicare Other | Admitting: Internal Medicine

## 2022-01-14 VITALS — BP 118/72 | HR 72 | Resp 18 | Ht 66.0 in | Wt 125.4 lb

## 2022-01-14 DIAGNOSIS — E440 Moderate protein-calorie malnutrition: Secondary | ICD-10-CM

## 2022-01-14 DIAGNOSIS — J309 Allergic rhinitis, unspecified: Secondary | ICD-10-CM | POA: Diagnosis not present

## 2022-01-14 DIAGNOSIS — M810 Age-related osteoporosis without current pathological fracture: Secondary | ICD-10-CM | POA: Diagnosis not present

## 2022-01-14 DIAGNOSIS — M059 Rheumatoid arthritis with rheumatoid factor, unspecified: Secondary | ICD-10-CM

## 2022-01-14 MED ORDER — LEVOCETIRIZINE DIHYDROCHLORIDE 5 MG PO TABS: 5.0000 mg | ORAL_TABLET | Freq: Every evening | ORAL | 5 refills | Status: DC

## 2022-01-14 MED ORDER — METHYLPREDNISOLONE 4 MG PO TBPK: ORAL_TABLET | ORAL | 0 refills | Status: DC

## 2022-01-14 MED ORDER — ALENDRONATE SODIUM 70 MG PO TABS: 70.0000 mg | ORAL_TABLET | ORAL | 11 refills | Status: DC

## 2022-01-14 NOTE — Assessment & Plan Note (Signed)
Followed by Rheumatology - last note reviewed Symptoms were better with Methotrexate, but needs to continue taking it Continue folic acid Started Prednisone taper for acute flareup

## 2022-01-14 NOTE — Assessment & Plan Note (Signed)
Advised to avoid skipping any meal Ensure supplements Increase fluid intake for proper hydration Avoid over-exertion at work

## 2022-01-14 NOTE — Progress Notes (Signed)
Established Patient Office Visit  Subjective:  Patient ID: Laurie Wallace, female    DOB: [birth year]  Age: 76 y.o. MRN: [id number]  CC:  Chief Complaint  Patient presents with   Follow-up    6 month follow up pt is having right shoulder pain and left leg pain worse for 2 weeks now also has fmla forms to fill out     HPI Laurie Wallace is a 76 y.o. female with past medical history of RA and osteoporosis who presents for f/u of her chronic medical conditions.  RA: She has recently run out of her methotrexate.  She complains of right shoulder pain, right knee pain, left hip and knee pain.  Denies any recent fever or chills.  Denies any new rash. She is followed by Rheumatology. Last blood tests were reviewed.  She takes Fosamax for osteoporosis.  She has brought FMLA paperwork for acute flareups, which will be filled out and faxed.  Past Medical History:  Diagnosis Date   Allergy    Arthritis    COPD (chronic obstructive pulmonary disease) ([facility])     Past Surgical History:  Procedure Laterality Date   ABDOMINAL HYSTERECTOMY     TOE SURGERY Right    1st toe    Family History  Problem Relation Age of Onset   Cancer Mother    Cancer Father    Heart failure Sister    COPD Sister    Dementia Sister     Social History   Socioeconomic History   Marital status: Single    Spouse name: Not on file   Number of children: Not on file   Years of education: Not on file   Highest education level: Not on file  Occupational History   Occupation: full time  Tobacco Use   Smoking status: Never   Smokeless tobacco: Never  Vaping Use   Vaping Use: Never used  Substance and Sexual Activity   Alcohol use: No   Drug use: No   Sexual activity: Not on file  Other Topics Concern   Not on file  Social History Narrative   Lives with her son   Right Handed   Drinks 1-2 cups caffeine daily in winter   Social Determinants of Health   Financial Resource  Strain: Low Risk  (05/07/2021)   Overall Financial Resource Strain (CARDIA)    Difficulty of Paying Living Expenses: Not hard at all  Food Insecurity: No Food Insecurity (05/07/2021)   Hunger Vital Sign    Worried About Running Out of Food in the Last Year: Never true    Ran Out of Food in the Last Year: Never true  Transportation Needs: No Transportation Needs (05/07/2021)   PRAPARE - Hydrologist (Medical): No    Lack of Transportation (Non-Medical): No  Physical Activity: Sufficiently Active (05/07/2021)   Exercise Vital Sign    Days of Exercise per Week: 5 days    Minutes of Exercise per Session: 30 min  Stress: No Stress Concern Present (05/07/2021)   [place]    Feeling of Stress : Not at all  Social Connections: Moderately Isolated (05/07/2021)   Social Connection and Isolation Panel [NHANES]    Frequency of Communication with Friends and Family: More than three times a week    Frequency of Social Gatherings with Friends and Family: Three times a week    Attends Religious Services: More than 4 times per  year    Active Member of Clubs or Organizations: No    Attends Archivist Meetings: Never    Marital Status: Widowed  Intimate Partner Violence: Not At Risk (05/07/2021)   Humiliation, Afraid, Rape, and Kick questionnaire    Fear of Current or Ex-Partner: No    Emotionally Abused: No    Physically Abused: No    Sexually Abused: No    Outpatient Medications Prior to Visit  Medication Sig Dispense Refill   Calcium Carbonate-Vit D-Min (CALTRATE 600+D PLUS MINERALS) 600-800 MG-UNIT CHEW Chew 1 tablet by mouth daily. 30 tablet 5   cyclobenzaprine (FLEXERIL) 5 MG tablet Take 1 tablet (5 mg total) by mouth 3 (three) times daily as needed for muscle spasms. (Patient not taking: Reported on 01/14/2022) 30 tablet 1   folic acid (FOLVITE) 1 MG tablet Take 1 tablet (1 mg total) by mouth  daily. (Patient not taking: Reported on 01/14/2022) 90 tablet 3   methotrexate (RHEUMATREX) 2.5 MG tablet Take 8 tablets (20 mg total) by mouth once a week. Caution:Chemotherapy. Protect from light. (Patient not taking: Reported on 01/14/2022) 104 tablet 0   alendronate (FOSAMAX) 70 MG tablet Take 1 tablet (70 mg total) by mouth every 7 (seven) days. Take with a full glass of water on an empty stomach. (Patient not taking: Reported on 01/14/2022) 4 tablet 11   benzonatate (TESSALON) 100 MG capsule Take 1 capsule (100 mg total) by mouth 2 (two) times daily as needed for cough. (Patient not taking: Reported on 01/14/2022) 20 capsule 0   levocetirizine (XYZAL) 5 MG tablet  (Patient not taking: Reported on 01/14/2022)  5   No facility-administered medications prior to visit.    No Known Allergies  ROS Review of Systems  Constitutional:  Negative for chills and fever.  HENT:  Positive for congestion and postnasal drip. Negative for sinus pressure, sinus pain and sore throat.   Eyes:  Negative for pain and discharge.  Respiratory:  Negative for cough and shortness of breath.   Cardiovascular:  Negative for chest pain and palpitations.  Gastrointestinal:  Negative for abdominal pain, constipation, diarrhea, nausea and vomiting.  Endocrine: Negative for polydipsia and polyuria.  Genitourinary:  Negative for dysuria and hematuria.  Musculoskeletal:  Positive for arthralgias, back pain and myalgias. Negative for neck pain and neck stiffness.  Skin:  Negative for rash.  Neurological:  Negative for dizziness and weakness.  Psychiatric/Behavioral:  Negative for agitation and behavioral problems.       Objective:    Physical Exam Vitals reviewed.  Constitutional:      General: She is not in acute distress.    Appearance: She is not diaphoretic.  HENT:     Head: Normocephalic and atraumatic.     Nose: Nose normal.     Mouth/Throat:     Mouth: Mucous membranes are moist.  Eyes:     General: No  scleral icterus.    Extraocular Movements: Extraocular movements intact.  Cardiovascular:     Rate and Rhythm: Normal rate and regular rhythm.     Pulses: Normal pulses.     Heart sounds: Normal heart sounds. No murmur heard. Pulmonary:     Breath sounds: Normal breath sounds. No wheezing or rales.  Abdominal:     Palpations: Abdomen is soft.     Tenderness: There is no abdominal tenderness.  Musculoskeletal:        General: Tenderness (R shoulder, right and left knee) and deformity (Small joints of b/l hands with  mild ulnar deviation) present.     Cervical back: Neck supple. No tenderness.     Right lower leg: No edema.     Left lower leg: No edema.  Skin:    General: Skin is warm.     Findings: No rash.  Neurological:     General: No focal deficit present.     Mental Status: She is alert and oriented to person, place, and time.     Cranial Nerves: No cranial nerve deficit.     Sensory: No sensory deficit.     Motor: No weakness.  Psychiatric:        Mood and Affect: Mood normal.        Behavior: Behavior normal.     BP 118/72 (BP Location: Right Arm, Patient Position: Sitting, Cuff Size: Normal)   Pulse 72   Resp 18   Ht _0  (1.676 m)   Wt 125 lb 6.4 oz (56.9 kg)   SpO2 98%   BMI 20.24 kg/m  Wt Readings from Last 3 Encounters:  01/14/22 125 lb 6.4 oz (56.9 kg)  10/13/21 124 lb (56.2 kg)  07/17/21 130 lb (59 kg)    Lab Results  Component Value Date   TSH 1.950 10/17/2020   Lab Results  Component Value Date   WBC 10.4 10/13/2021   HGB 13.1 10/13/2021   HCT 39.0 10/13/2021   MCV 90.3 10/13/2021   PLT 381 10/13/2021   Lab Results  Component Value Date   NA 140 10/13/2021   K 4.9 10/13/2021   CO2 28 10/13/2021   GLUCOSE 100 (H) 10/13/2021   BUN 15 10/13/2021   CREATININE 0.75 10/13/2021   BILITOT 0.2 10/13/2021   ALKPHOS 126 (H) 10/17/2020   AST 13 10/13/2021   ALT 7 10/13/2021   PROT 7.1 10/13/2021   ALBUMIN 3.9 10/17/2020   CALCIUM 9.2  10/13/2021   EGFR 83 10/13/2021   No results found for: "CHOL" No results found for: "HDL" No results found for: "LDLCALC" No results found for: "TRIG" No results found for: "CHOLHDL" No results found for: "HGBA1C"    Assessment & Plan:   Problem List Items Addressed This Visit       Respiratory   Allergic sinusitis    Continue Xyzal      Relevant Medications   levocetirizine (XYZAL) 5 MG tablet   methylPREDNISolone (MEDROL DOSEPAK) 4 MG TBPK tablet     Musculoskeletal and Integument   Seropositive rheumatoid arthritis ([place]) - Primary    Followed by Rheumatology - last note reviewed Symptoms were better with Methotrexate, but needs to continue taking it Continue folic acid Started Prednisone taper for acute flareup      Relevant Medications   methylPREDNISolone (MEDROL DOSEPAK) 4 MG TBPK tablet   Age-related osteoporosis without current pathological fracture    DEXA scan reviewed Was placed on alendronate, she needs to get refills from pharmacy. Continue Caltrate 600 + D3      Relevant Medications   alendronate (FOSAMAX) 70 MG tablet     Other   Moderate protein-calorie malnutrition ([facility])    Advised to avoid skipping any meal Ensure supplements Increase fluid intake for proper hydration Avoid over-exertion at work       Meds ordered this encounter  Medications   alendronate (FOSAMAX) 70 MG tablet    Sig: Take 1 tablet (70 mg total) by mouth every 7 (seven) days. Take with a full glass of water on an empty stomach.    Dispense:  4 tablet    Refill:  11   levocetirizine (XYZAL) 5 MG tablet    Sig: Take 1 tablet (5 mg total) by mouth every evening.    Dispense:  30 tablet    Refill:  5   methylPREDNISolone (MEDROL DOSEPAK) 4 MG TBPK tablet    Sig: Take as package instructions.    Dispense:  1 each    Refill:  0    Follow-up: Return in about 6 months (around 07/17/2022) for Annual physical (after 07/17/22).    Lindell Spar, MD

## 2022-01-14 NOTE — Assessment & Plan Note (Signed)
DEXA scan reviewed Was placed on alendronate, she needs to get refills from pharmacy. Continue Caltrate 600 + D3 

## 2022-01-14 NOTE — Assessment & Plan Note (Signed)
Continue Xyzal. 

## 2022-01-14 NOTE — Patient Instructions (Signed)
Please start taking Prednisone as prescribed.  Please contact Dr Rice's office for refills of your Rheumatoid arthritis medication.

## 2022-01-16 DIAGNOSIS — Z0279 Encounter for issue of other medical certificate: Secondary | ICD-10-CM

## 2022-01-16 NOTE — Telephone Encounter (Signed)
Called patient 7/14 3:30 [facility] for FMLA fpr pick up and  charge.  Called home phone.

## 2022-01-26 ENCOUNTER — Ambulatory Visit: Payer: Medicare Other | Admitting: Internal Medicine

## 2022-02-02 NOTE — Progress Notes (Deleted)
Office Visit Note  Patient: Laurie Wallace             Date of Birth: 08/31/1945           MRN: [id number]             PCP: Lindell Spar, MD Referring: Lindell Spar, MD Visit Date: 02/11/2022   Subjective:  No chief complaint on file.   History of Present Illness: Laurie Wallace is a 76 y.o. female here for follow up seropositive RA.   Previous HPI 10/13/2021  Laurie Wallace is a 76 y.o. female here for follow up for seropositive RA on methotrexate 20 mg PO weekly. She is doing well since last visit her shoulder pain increases on days with poor weather or if she uses her arms a lot more than usual. Otherwise pain free on some days. She had COVID infection last month treated with paxlovid and tessalon and tylenol with good improvement of symptoms.    Previous HPI 07/10/21 Laurie Wallace is a 76 y.o. female here for follow up for seropositive RA on methotrexate 20 mg PO weekly which was increased after last visit.  She feels symptoms are overall improving she has 0 pain on some days occasionally swelling and stiffness affecting the left arm.  Left shoulder range of motion remains slightly reduced but with much less pain.  She denies any new side effects on increased methotrexate dose.  She has regained about 5 pounds since having some unintentional weight loss last year denies any GI complaints reports normal appetite.   Previous HPI   12/11/20 Laurie Wallace is a 76 y.o. female here for evaluation of joint pains in multiple areas with elevated inflammatory markers.  She believes these symptoms in her shoulder started since about 4 5 months ago without any specific preceding injury or event that she can recall.  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  She does not recall any past significant shoulder injuries or surgeries. Previous  treatments tried include cymbalta that she does not notice a significant improvement.  Since last month she was started on oral daily prednisone for the past few weeks and has noticed a significant improvement in her joint symptoms particularly resolution of the hand swelling although does continue having some left shoulder pain.   LAbs reviewed 10/2020 ANA neg ACE neg RF neg ANCAs neg ESR 68 CRP 25 Alk phos 126 Plts 495 CK 55  No Rheumatology ROS completed.   PMFS History:  Patient Active Problem List   Diagnosis Date Noted   Allergic sinusitis 01/14/2022   Encounter for general adult medical examination with abnormal findings 07/17/2021   Age-related osteoporosis without current pathological fracture 07/17/2021   Muscle cramps 02/28/2021   High risk medication use 12/26/2020   Seropositive rheumatoid arthritis ([place]) 10/25/2020   Moderate protein-calorie malnutrition ([place]) 10/25/2020   Cervical spine pain 10/23/2020   Lumbar pain 10/23/2020   Other intervertebral disc displacement, lumbar region 04/07/2010    Past Medical History:  Diagnosis Date   Allergy    Arthritis    COPD (chronic obstructive pulmonary disease) ([place])     Family History  Problem Relation Age of Onset   Cancer Mother    Cancer Father    Heart failure Sister    COPD Sister    Dementia Sister    Past Surgical History:  Procedure  Laterality Date   ABDOMINAL HYSTERECTOMY     TOE SURGERY Right    1st toe   Social History   Social History Narrative   Lives with her son   Right Handed   Drinks 1-2 cups caffeine daily in winter   Immunization History  Administered Date(s) Administered   PNEUMOCOCCAL CONJUGATE-20 02/28/2021     Objective: Vital Signs: There were no vitals taken for this visit.   Physical Exam   Musculoskeletal Exam: ***  CDAI Exam: CDAI Score: -- Patient Global: --; Provider Global: -- Swollen: --; Tender: -- Joint Exam 02/11/2022   No joint exam has been  documented for this visit   There is currently no information documented on the homunculus. Go to the Rheumatology activity and complete the homunculus joint exam.  Investigation: No additional findings.  Imaging: No results found.  Recent Labs: Lab Results  Component Value Date   WBC 10.4 10/13/2021   HGB 13.1 10/13/2021   PLT 381 10/13/2021   NA 140 10/13/2021   K 4.9 10/13/2021   CL 105 10/13/2021   CO2 28 10/13/2021   GLUCOSE 100 (H) 10/13/2021   BUN 15 10/13/2021   CREATININE 0.75 10/13/2021   BILITOT 0.2 10/13/2021   ALKPHOS 126 (H) 10/17/2020   AST 13 10/13/2021   ALT 7 10/13/2021   PROT 7.1 10/13/2021   ALBUMIN 3.9 10/17/2020   CALCIUM 9.2 10/13/2021   GFRAA 88 09/28/2014    Speciality Comments: No specialty comments available.  Procedures:  No procedures performed Allergies: Patient has no known allergies.   Assessment / Plan:     Visit Diagnoses: No diagnosis found.  ***  Orders: No orders of the defined types were placed in this encounter.  No orders of the defined types were placed in this encounter.    Follow-Up Instructions: No follow-ups on file.   Bertram Savin, RT  Note - This record has been created using Editor, commissioning.  Chart creation errors have been sought, but may not always  have been located. Such creation errors do not reflect on  the standard of medical care.

## 2022-02-11 ENCOUNTER — Ambulatory Visit: Payer: Medicare Other | Admitting: Internal Medicine

## 2022-02-11 DIAGNOSIS — Z79899 Other long term (current) drug therapy: Secondary | ICD-10-CM

## 2022-02-11 DIAGNOSIS — M059 Rheumatoid arthritis with rheumatoid factor, unspecified: Secondary | ICD-10-CM

## 2022-02-19 NOTE — Progress Notes (Signed)
Office Visit Note  Patient: Laurie Wallace             Date of Birth: 1945/09/23           MRN: [id number]             PCP: Lindell Spar, MD Referring: Lindell Spar, MD Visit Date: 02/26/2022   Subjective:   History of Present Illness: Laurie Wallace is a 76 y.o. female here for follow up seropositive RA.  She has been off of the p.o. methotrexate for about a month.  She experienced increased joint pain in shoulders and knees off the medication.  She took a Medrol Dosepak which improved joint symptoms pretty quickly.  Previous HPI 10/13/2021 [name] is a 76 y.o. female here for follow up for seropositive RA on methotrexate 20 mg PO weekly. She is doing well since last visit her shoulder pain increases on days with poor weather or if she uses her arms a lot more than usual. Otherwise pain free on some days. She had COVID infection last month treated with paxlovid and tessalon and tylenol with good improvement of symptoms.    Previous HPI 07/10/21 [name] is a 76 y.o. female here for follow up for seropositive RA on methotrexate 20 mg PO weekly which was increased after last visit.  She feels symptoms are overall improving she has 0 pain on some days occasionally swelling and stiffness affecting the left arm.  Left shoulder range of motion remains slightly reduced but with much less pain.  She denies any new side effects on increased methotrexate dose.  She has regained about 5 pounds since having some unintentional weight loss last year denies any GI complaints reports normal appetite.   Previous HPI   12/11/20 EMMALIA HEYBOER is a 76 y.o. female here for evaluation of joint pains in multiple areas with elevated inflammatory markers.  She believes these symptoms in her shoulder started since about 4 5 months ago without any specific preceding injury or event that she can recall.  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  She does not recall any past significant shoulder injuries or surgeries. Previous treatments tried include cymbalta that she does not notice a significant improvement.  Since last month she was started on oral daily prednisone for the past few weeks and has noticed a significant improvement in her joint symptoms particularly resolution of the hand swelling although does continue having some left shoulder pain.   LAbs reviewed 10/2020 ANA neg ACE neg RF neg ANCAs neg ESR 68 CRP 25 Alk phos 126 Plts 495 CK 55    Review of Systems  Constitutional:  Negative for fatigue.  HENT:  Positive for mouth dryness. Negative for mouth sores.   Eyes:  Negative for dryness.  Respiratory:  Positive for shortness of breath.   Cardiovascular:  Negative for chest pain and palpitations.  Gastrointestinal:  Positive for constipation. Negative for blood in stool and diarrhea.  Endocrine: Negative for increased urination.  Genitourinary:  Negative for involuntary urination.  Musculoskeletal:  Positive for joint pain, joint pain, joint swelling, myalgias, morning stiffness, muscle tenderness and myalgias. Negative for gait problem and muscle weakness.  Skin:  Negative for color change, rash, hair loss and sensitivity to sunlight.  Allergic/Immunologic: Negative for susceptible to infections.  Neurological:  Negative for dizziness and headaches.  Hematological:  Negative for swollen glands.  Psychiatric/Behavioral:  Negative for depressed mood and sleep disturbance. The patient is not nervous/anxious.     PMFS History:  Patient Active Problem List   Diagnosis Date Noted   Allergic sinusitis 01/14/2022   Encounter for general adult medical examination with abnormal findings 07/17/2021   Age-related osteoporosis without current pathological fracture 07/17/2021   Muscle cramps 02/28/2021   High risk medication  use 12/26/2020   Seropositive rheumatoid arthritis ([place]) 10/25/2020   Moderate protein-calorie malnutrition ([place]) 10/25/2020   Cervical spine pain 10/23/2020   Lumbar pain 10/23/2020   Other intervertebral disc displacement, lumbar region 04/07/2010    Past Medical History:  Diagnosis Date   Allergy    Arthritis    COPD (chronic obstructive pulmonary disease) ([place])     Family History  Problem Relation Age of Onset   Cancer Mother    Cancer Father    Heart failure Sister    COPD Sister    Dementia Sister    Past Surgical History:  Procedure Laterality Date   ABDOMINAL HYSTERECTOMY     TOE SURGERY Right    1st toe   Social History   Social History Narrative   Lives with her son   Right Handed   Drinks 1-2 cups caffeine daily in winter   Immunization History  Administered Date(s) Administered   PNEUMOCOCCAL CONJUGATE-20 02/28/2021     Objective: Vital Signs: BP 128/61 (BP Location: Left Arm, Patient Position: Sitting, Cuff Size: Normal)   Pulse 76   Resp 16   Ht 5' 6"  (1.676 m)   Wt 127 lb 3.2 oz (57.7 kg)   BMI 20.53 kg/m    Physical Exam Cardiovascular:     Rate and Rhythm: Normal rate and regular rhythm.  Pulmonary:     Effort: Pulmonary effort is normal.     Breath sounds: Normal breath sounds.  Skin:    General: Skin is warm and dry.     Findings: No rash.     Comments: 2 bruises on the calf no surrounding swelling or edema  Neurological:     Mental Status: She is alert.  Psychiatric:        Mood and Affect: Mood normal.      Musculoskeletal Exam:  Shoulders tenderness to pressure and pain with reaching overhead Scapula and shoulder in a rolled forward position as compared to left side Elbows full ROM no tenderness or swelling Wrists full ROM no tenderness or swelling Fingers with first CMC joint squaring, right hand C-shaped thumb deformity, Heberden's nodes in PIP and DIPs Mild lateral hip tenderness to pressure and with rotation Knees full  ROM tenderness to pressure worse localized around the patellar tendon, no effusions Ankles full ROM no tenderness or swelling   CDAI Exam: CDAI Score: -- Patient Global: --; Provider Global: -- Swollen: 0 ; Tender: 4  Joint Exam 02/26/2022      Right  Left  Glenohumeral   Tender   Tender  Knee   Tender   Tender     Investigation: No additional findings.  Imaging: No results found.  Recent Labs: Lab Results  Component Value Date   WBC 11.1 (H) 02/26/2022   HGB 13.6 02/26/2022   PLT 399 02/26/2022   NA 141 02/26/2022   K 4.9 02/26/2022   CL 104 02/26/2022   CO2 27 02/26/2022   GLUCOSE 92 02/26/2022   BUN 16 02/26/2022   CREATININE 0.85 02/26/2022   BILITOT 0.3 02/26/2022   ALKPHOS  126 (H) 10/17/2020   AST 18 02/26/2022   ALT 23 02/26/2022   PROT 7.1 02/26/2022   ALBUMIN 3.9 10/17/2020   CALCIUM 9.1 02/26/2022   GFRAA 88 09/28/2014    Speciality Comments: No specialty comments available.  Procedures:  No procedures performed Allergies: Patient has no known allergies.   Assessment / Plan:     Visit Diagnoses: Seropositive rheumatoid arthritis ([place]) - Plan: methotrexate (RHEUMATREX) 2.5 MG tablet, folic acid (FOLVITE) 1 MG tablet, Sedimentation rate  Not too much objective synovitis on exam but is definitely reporting increased joint pain and stiffness off the methotrexate and required at least 1 recent steroid Dosepak.  Checking sedimentation rate for disease activity monitoring today.  Recommend her to restart the methotrexate 20 mg p.o. weekly and folic acid 1 mg daily.  High risk medication use - methotrexate (RHEUMATREX) 2.5 MG tablet 8 tablets (20 mg total) by mouth once a week - Plan: CBC with Differential/Platelet, COMPLETE METABOLIC PANEL WITH GFR  Check CBC and CMP for methotrexate toxicity monitoring.  Appears medication fell off more unintentionally due to delayed follow-up for monitoring versus also patient interested in seeing if this remains  necessary.  Orders: Orders Placed This Encounter  Procedures   Sedimentation rate   CBC with Differential/Platelet   COMPLETE METABOLIC PANEL WITH GFR   Meds ordered this encounter  Medications   methotrexate (RHEUMATREX) 2.5 MG tablet    Sig: Take 8 tablets (20 mg total) by mouth once a week. Caution:Chemotherapy. Protect from light.    Dispense:  104 tablet    Refill:  0    Patient requests home delivery   folic acid (FOLVITE) 1 MG tablet    Sig: Take 1 tablet (1 mg total) by mouth daily.    Dispense:  90 tablet    Refill:  3    Patient requests home delivery     Follow-Up Instructions: Return in about 3 months (around 05/29/2022) for RA on MTX f/u 58mo.   CCollier Salina MD  Note - This record has been created using [employer]  Chart creation errors have been sought, but may not always  have been located. Such creation errors do not reflect on  the standard of medical care.

## 2022-02-26 ENCOUNTER — Encounter: Payer: Self-pay | Admitting: Internal Medicine

## 2022-02-26 ENCOUNTER — Ambulatory Visit: Payer: Medicare Other | Attending: Internal Medicine | Admitting: Internal Medicine

## 2022-02-26 VITALS — BP 128/61 | HR 76 | Resp 16 | Ht 66.0 in | Wt 127.2 lb

## 2022-02-26 DIAGNOSIS — Z79899 Other long term (current) drug therapy: Secondary | ICD-10-CM | POA: Diagnosis not present

## 2022-02-26 DIAGNOSIS — M059 Rheumatoid arthritis with rheumatoid factor, unspecified: Secondary | ICD-10-CM

## 2022-02-26 MED ORDER — FOLIC ACID 1 MG PO TABS: 1.0000 mg | ORAL_TABLET | Freq: Every day | ORAL | 3 refills | Status: DC

## 2022-02-26 MED ORDER — METHOTREXATE 2.5 MG PO TABS: 20.0000 mg | ORAL_TABLET | ORAL | 0 refills | Status: DC

## 2022-02-27 LAB — COMPLETE METABOLIC PANEL WITH GFR
AG Ratio: 1.4 (calc) (ref 1.0–2.5)
ALT: 23 U/L (ref 6–29)
AST: 18 U/L (ref 10–35)
Albumin: 4.2 g/dL (ref 3.6–5.1)
Alkaline phosphatase (APISO): 119 U/L (ref 37–153)
BUN: 16 mg/dL (ref 7–25)
CO2: 27 mmol/L (ref 20–32)
Calcium: 9.1 mg/dL (ref 8.6–10.4)
Chloride: 104 mmol/L (ref 98–110)
Creat: 0.85 mg/dL (ref 0.60–1.00)
Globulin: 2.9 g/dL (calc) (ref 1.9–3.7)
Glucose, Bld: 92 mg/dL (ref 65–99)
Potassium: 4.9 mmol/L (ref 3.5–5.3)
Sodium: 141 mmol/L (ref 135–146)
Total Bilirubin: 0.3 mg/dL (ref 0.2–1.2)
Total Protein: 7.1 g/dL (ref 6.1–8.1)
eGFR: 71 mL/min/{1.73_m2} (ref >=60)

## 2022-02-27 LAB — CBC WITH DIFFERENTIAL/PLATELET
Absolute Monocytes: 722 cells/uL (ref 200–950)
Basophils Absolute: 67 cells/uL (ref 0–200)
Basophils Relative: 0.6 %
Eosinophils Absolute: 266 cells/uL (ref 15–500)
Eosinophils Relative: 2.4 %
HCT: 40.7 % (ref 35.0–45.0)
Hemoglobin: 13.6 g/dL (ref 11.7–15.5)
Lymphs Abs: 2375 cells/uL (ref 850–3900)
MCH: 30.7 pg (ref 27.0–33.0)
MCHC: 33.4 g/dL (ref 32.0–36.0)
MCV: 91.9 fL (ref 80.0–100.0)
MPV: 10.1 fL (ref 7.5–12.5)
Monocytes Relative: 6.5 %
Neutro Abs: 7670 cells/uL (ref 1500–7800)
Neutrophils Relative %: 69.1 %
Platelets: 399 10*3/uL (ref 140–400)
RBC: 4.43 10*6/uL (ref 3.80–5.10)
RDW: 15.5 % — ABNORMAL HIGH (ref 11.0–15.0)
Total Lymphocyte: 21.4 %
WBC: 11.1 10*3/uL — ABNORMAL HIGH (ref 3.8–10.8)

## 2022-02-27 LAB — SEDIMENTATION RATE: Sed Rate: 19 mm/h (ref 0–30)

## 2022-02-27 NOTE — Progress Notes (Signed)
Lab results look fine for continuing methotrexate at the same dose 8 tablets weekly and I have sent new Rx.

## 2022-04-29 ENCOUNTER — Other Ambulatory Visit: Payer: Self-pay | Admitting: Internal Medicine

## 2022-04-29 DIAGNOSIS — M059 Rheumatoid arthritis with rheumatoid factor, unspecified: Secondary | ICD-10-CM

## 2022-05-08 ENCOUNTER — Encounter: Payer: Medicare Other | Admitting: Internal Medicine

## 2022-05-11 ENCOUNTER — Emergency Department (HOSPITAL_COMMUNITY): Payer: Medicare Other

## 2022-05-11 ENCOUNTER — Encounter (HOSPITAL_COMMUNITY): Payer: Self-pay | Admitting: *Deleted

## 2022-05-11 ENCOUNTER — Other Ambulatory Visit: Payer: Self-pay

## 2022-05-11 ENCOUNTER — Emergency Department (HOSPITAL_COMMUNITY)
Admission: EM | Admit: 2022-05-11 | Discharge: 2022-05-11 | Disposition: A | Discharge status: Home / Self Care | Payer: Medicare Other | Attending: Student | Admitting: Student

## 2022-05-11 ENCOUNTER — Other Ambulatory Visit (HOSPITAL_COMMUNITY): Payer: Medicare Other

## 2022-05-11 ENCOUNTER — Other Ambulatory Visit (HOSPITAL_COMMUNITY): Payer: Self-pay

## 2022-05-11 DIAGNOSIS — M79601 Pain in right arm: Secondary | ICD-10-CM

## 2022-05-11 DIAGNOSIS — M069 Rheumatoid arthritis, unspecified: Secondary | ICD-10-CM | POA: Diagnosis not present

## 2022-05-11 DIAGNOSIS — S40911A Unspecified superficial injury of right shoulder, initial encounter: Secondary | ICD-10-CM | POA: Diagnosis not present

## 2022-05-11 DIAGNOSIS — M19021 Primary osteoarthritis, right elbow: Secondary | ICD-10-CM | POA: Diagnosis not present

## 2022-05-11 DIAGNOSIS — M25511 Pain in right shoulder: Secondary | ICD-10-CM

## 2022-05-11 DIAGNOSIS — M059 Rheumatoid arthritis with rheumatoid factor, unspecified: Secondary | ICD-10-CM

## 2022-05-11 MED ORDER — METHYLPREDNISOLONE 4 MG PO TBPK: ORAL_TABLET | ORAL | 0 refills | Status: DC

## 2022-05-11 MED ORDER — OXYCODONE-ACETAMINOPHEN 5-325 MG PO TABS
1.0000 | ORAL_TABLET | Freq: Once | ORAL | Status: AC
  Administered 2022-05-11: 1 via ORAL
  Filled 2022-05-11: qty 1

## 2022-05-11 NOTE — ED Triage Notes (Signed)
Pt c/o right arm pain x 2 weeks; pt states she does a lot of lifting and repetition with her right arm at work but does not have any obvious injury  Pt has limiting ROM and states she is not able to lift her arm

## 2022-05-11 NOTE — Discharge Instructions (Signed)
Please use Tylenol for pain.  You may use 1000 mg of Tylenol every 6 hours.  Not to exceed 4 g of Tylenol within 24 hours.  Continue using your flexeril, take your methotrexate, and begin the steroid taper. Please follow up with your rheumatologist at your earliest convenience.

## 2022-05-11 NOTE — ED Provider Notes (Signed)
[facility] EMERGENCY DEPARTMENT Provider Note   CSN: [id number] Arrival date & time: 05/11/22  1448     History  Chief Complaint  Patient presents with   Arm Pain    Laurie Wallace is a 76 y.o. female with past medical history significant for rheumatoid arthritis, osteoporosis who presents with concern for right shoulder, right elbow pain worsening for the last 2 weeks.  Patient reports she does a lot of lifting and repetition with right arm at work but denies any obvious injury.  She is being treated for rheumatoid arthritis, takes methotrexate, folate, reports that she has been taking his medications as prescribed but has not had any prednisone or other steroids since around July or August.  She reports she has ended plan to follow-up with her rheumatologist in around 6 days.   Arm Pain       Home Medications Prior to Admission medications   Medication Sig Start Date End Date Taking? Authorizing Provider  alendronate (FOSAMAX) 70 MG tablet Take 1 tablet (70 mg total) by mouth every 7 (seven) days. Take with a full glass of water on an empty stomach. 01/14/22   Lindell Spar, MD  Calcium Carbonate-Vit D-Min (CALTRATE 600+D PLUS MINERALS) 600-800 MG-UNIT CHEW Chew 1 tablet by mouth daily. 10/29/20   Lindell Spar, MD  cyclobenzaprine (FLEXERIL) 5 MG tablet Take 1 tablet (5 mg total) by mouth 3 (three) times daily as needed for muscle spasms. 02/28/21   Lindell Spar, MD  folic acid (FOLVITE) 1 MG tablet Take 1 tablet (1 mg total) by mouth daily. 02/26/22   Rice, Resa Miner, MD  levocetirizine (XYZAL) 5 MG tablet Take 1 tablet (5 mg total) by mouth every evening. 01/14/22   Lindell Spar, MD  methotrexate (RHEUMATREX) 2.5 MG tablet Take 8 tablets (20 mg total) by mouth once a week. Caution:Chemotherapy. Protect from light. 02/26/22   Rice, Resa Miner, MD  methylPREDNISolone (MEDROL DOSEPAK) 4 MG TBPK tablet Take as package instructions. 05/11/22   Graeme Menees,  Delania Ferg H, PA-C  brompheniramine (VAZOL) 2 MG/5ML LIQD Take 2 mg by mouth 2 (two) times daily.  07/23/20  [provider]  fluticasone (FLONASE) 50 MCG/ACT nasal spray Place 2 sprays into the nose daily.  07/23/20  [provider]      Allergies    Patient has no known allergies.    Review of Systems   Review of Systems  Musculoskeletal:  Positive for arthralgias and joint swelling.  All other systems reviewed and are negative.   Physical Exam Updated Vital Signs BP (!) 140/79   Pulse (!) 59   Temp 98.2 F (36.8 C) (Oral)   Resp 18   Ht '5\' 6"'$  (1.676 m)   Wt 57.2 kg   SpO2 100%   BMI 20.34 kg/m  Physical Exam Vitals and nursing note reviewed.  Constitutional:      General: She is not in acute distress.    Appearance: Normal appearance.  HENT:     Head: Normocephalic and atraumatic.  Eyes:     General:        Right eye: No discharge.        Left eye: No discharge.  Cardiovascular:     Rate and Rhythm: Normal rate and regular rhythm.     Pulses: Normal pulses.  Pulmonary:     Effort: Pulmonary effort is normal. No respiratory distress.  Musculoskeletal:        General: No deformity.  Comments: Patient with significant tenderness right upper extremity at the shoulder and elbow, decreased range of motion secondary to pain, she has intact passive range of motion but will not actively range the joint secondary to pain.  No significant joint effusion noted.  No step-off or deformity at the shoulder, elbow.  Skin:    General: Skin is warm and dry.     Capillary Refill: Capillary refill takes less than 2 seconds.  Neurological:     Mental Status: She is alert and oriented to person, place, and time.  Psychiatric:        Mood and Affect: Mood normal.        Behavior: Behavior normal.     ED Results / Procedures / Treatments   Labs (all labs ordered are listed, but only abnormal results are displayed) Labs Reviewed - No data to  display  EKG None  Radiology DG Shoulder Right  Result Date: 05/11/2022 CLINICAL DATA:  Right shoulder pain for 2 weeks.  Arthritis. EXAM: RIGHT SHOULDER - 2+ VIEW COMPARISON:  Right shoulder radiographs 12/11/2020 FINDINGS: There is no evidence of fracture or dislocation. There is no evidence of arthropathy or other focal bone abnormality. Soft tissues are unremarkable. IMPRESSION: Negative. Electronically Signed   By: Logan Bores M.D.   On: 05/11/2022 12:09   DG Elbow Complete Right  Result Date: 05/11/2022 CLINICAL DATA:  Pain, arthritis. EXAM: RIGHT ELBOW - COMPLETE 3+ VIEW COMPARISON:  None Available. FINDINGS: There is no acute fracture or dislocation. Elbow alignment is maintained. The joint spaces are preserved. There is mild degenerative change at the lateral epicondyle. The soft tissues are unremarkable. There is no effusion. IMPRESSION: Mild degenerative change of the lateral epicondyle. No acute finding. Electronically Signed   By: Valetta Mole M.D.   On: 05/11/2022 12:07    Procedures Procedures    Medications Ordered in ED Medications  oxyCODONE-acetaminophen (PERCOCET/ROXICET) 5-325 MG per tablet 1 tablet (has no administration in time range)    ED Course/ Medical Decision Making/ A&P                           Medical Decision Making Amount and/or Complexity of Data Reviewed Radiology: ordered.  Risk Prescription drug management.   This is an overall well-appearing 76 year old female with past medical history significant for rheumatoid arthritis who complains of acute on chronic right shoulder, right arm pain for the last 2 weeks.  Patient with history of repetitive motion of the right arm, reports that she does a lot of sweeping, cleaning, works in a custodial job.  Patient takes methotrexate chronically for her rheumatoid arthritis, has Flexeril at home, she additionally takes alendronate secondary to history of some osteoporosis.  She reports last Medrol Dosepak  in July, she has a plan to follow-up with her rheumatologist in around 6 days.  On my exam I note some tenderness of the right shoulder, right elbow, with intact passive but without significant active range of motion.  No step-off or deformities noted.  I independently interpreted imaging including plain film radiograph of the right shoulder, right elbow which shows some degenerative changes without acute fracture, dislocation, or effusion. I agree with the radiologist interpretation.  Patient symptoms are consistent with rheumatoid arthritis flare of the right upper extremity, exacerbated with her work.  I see no significant acute muscle strain, tear, fractures, dislocations, or effusion.  No signs of septic arthritis noted.  She is intact neurovascularly in the  affected extremity.  We will treat her pain in the emergency department with Percocet, plan to discharge with a Medrol Dosepak, and to encourage continuing her at home pain medications, encourage close follow-up with her rheumatologist in 6 days as planned.  Recommend the patient take some time off work for rest.  Patient understands and agrees to plan, and is discharged in stable condition at this time. Final Clinical Impression(s) / ED Diagnoses Final diagnoses:  Rheumatoid arthritis flare (HCC)  Right arm pain  Acute pain of right shoulder    Rx / DC Orders ED Discharge Orders          Ordered    methylPREDNISolone (MEDROL DOSEPAK) 4 MG TBPK tablet        05/11/22 1237              Annisha Baar, [place] H, PA-C 05/11/22 1259    Kommor, Debe Coder, MD 05/12/22 0730

## 2022-05-12 ENCOUNTER — Ambulatory Visit (INDEPENDENT_AMBULATORY_CARE_PROVIDER_SITE_OTHER): Payer: Medicare Other | Admitting: Internal Medicine

## 2022-05-12 ENCOUNTER — Encounter: Payer: Self-pay | Admitting: Internal Medicine

## 2022-05-12 DIAGNOSIS — Z0000 Encounter for general adult medical examination without abnormal findings: Secondary | ICD-10-CM

## 2022-05-12 NOTE — Patient Instructions (Addendum)
  Ms. Chiao , Thank you for taking time to come for your Medicare Wellness Visit. I appreciate your ongoing commitment to your health goals. Please review the following plan we discussed and let me know if I can assist you in the future.   These are the goals we discussed:Stay moving.   Follow up with Dr.Rice to discuss Flu Vaccine  Ask for Advanced directive paperwork at next visit  Plan to get grab bars in your bathroom. Shower chair order placed. Audiology referral placed to check hearing.   Follow up with Dr.Patel in January to discuss if you will need colon cancer screening.   This is a list of the screening recommended for you and due dates:  Health Maintenance  Topic Date Due   Tetanus Vaccine  Never done   Zoster (Shingles) Vaccine (1 of 2) Never done   Flu Shot  Never done   Medicare Annual Wellness Visit  05/07/2022   Pneumonia Vaccine  Completed   DEXA scan (bone density measurement)  Completed   Hepatitis C Screening: USPSTF Recommendation to screen - Ages 73-79 yo.  Completed   HPV Vaccine  Aged Out   COVID-19 Vaccine  Discontinued

## 2022-05-12 NOTE — Progress Notes (Signed)
Subjective:  This is a telephone encounter between Laurie Wallace and Laurie Wallace on 05/12/2022 for AWV. The visit was conducted with the patient located at home and Laurie Wallace at [facility]. The patient's identity was confirmed using their DOB and current address. The patient has consented to being evaluated through a telephone encounter and understands the associated risks (an examination cannot be done and the patient may need to come in for an appointment) / benefits (allows the patient to remain at home, decreasing exposure to coronavirus).    Laurie Wallace is a 76 y.o. female who presents for Medicare Annual (Subsequent) preventive examination.  Review of Systems    Review of Systems  All other systems reviewed and are negative.  Objective:    Today's Vitals   05/12/22 0835  PainSc: 7    There is no height or weight on file to calculate BMI.     05/12/2022    8:38 AM 05/11/2022   11:06 AM 05/07/2021    9:43 AM 03/28/2018    1:04 PM 02/27/2018    7:33 AM 08/18/2017    7:58 AM 08/17/2017   10:49 AM  Advanced Directives  Does Patient Have a Medical Advance Directive? No No Yes No No No No  Type of Advance Directive   [place]      Does patient want to make changes to medical advance directive?   Yes (MAU/Ambulatory/Procedural Areas - Information given)      Would patient like information on creating a medical advance directive?   Yes (MAU/Ambulatory/Procedural Areas - Information given) No - Patient declined No - Patient declined  No - Patient declined    Current Medications (verified) Outpatient Encounter Medications as of 05/12/2022  Medication Sig   [DISCONTINUED] methylPREDNISolone (MEDROL DOSEPAK) 4 MG TBPK tablet Take as package instructions.   alendronate (FOSAMAX) 70 MG tablet Take 1 tablet (70 mg total) by mouth every 7 (seven) days. Take with a full glass of water on an empty stomach.   Calcium Carbonate-Vit D-Min (CALTRATE 600+D  PLUS MINERALS) 600-800 MG-UNIT CHEW Chew 1 tablet by mouth daily.   cyclobenzaprine (FLEXERIL) 5 MG tablet Take 1 tablet (5 mg total) by mouth 3 (three) times daily as needed for muscle spasms.   folic acid (FOLVITE) 1 MG tablet Take 1 tablet (1 mg total) by mouth daily.   levocetirizine (XYZAL) 5 MG tablet Take 1 tablet (5 mg total) by mouth every evening.   methotrexate (RHEUMATREX) 2.5 MG tablet Take 8 tablets (20 mg total) by mouth once a week. Caution:Chemotherapy. Protect from light.   [DISCONTINUED] brompheniramine (VAZOL) 2 MG/5ML LIQD Take 2 mg by mouth 2 (two) times daily.   [DISCONTINUED] fluticasone (FLONASE) 50 MCG/ACT nasal spray Place 2 sprays into the nose daily.   No facility-administered encounter medications on file as of 05/12/2022.    Allergies (verified) Patient has no known allergies.   History: Past Medical History:  Diagnosis Date   Allergy    Arthritis    COPD (chronic obstructive pulmonary disease) (HCC)    Past Surgical History:  Procedure Laterality Date   ABDOMINAL HYSTERECTOMY     TOE SURGERY Right    1st toe   Family History  Problem Relation Age of Onset   Cancer Mother    Cancer Father    Heart failure Sister    COPD Sister    Dementia Sister    Social History   Socioeconomic History   Marital status: Single  Spouse name: Not on file   Number of children: Not on file   Years of education: Not on file   Highest education level: Not on file  Occupational History   Occupation: full time  Tobacco Use   Smoking status: Never    Passive exposure: Never   Smokeless tobacco: Never  Vaping Use   Vaping Use: Never used  Substance and Sexual Activity   Alcohol use: No   Drug use: No   Sexual activity: Not on file  Other Topics Concern   Not on file  Social History Narrative   Lives with her son   Right Handed   Drinks 1-2 cups caffeine daily in winter   Social Determinants of Health   Financial Resource Strain: Low Risk   (05/07/2021)   Overall Financial Resource Strain (CARDIA)    Difficulty of Paying Living Expenses: Not hard at all  Food Insecurity: No Food Insecurity (05/07/2021)   Hunger Vital Sign    Worried About Running Out of Food in the Last Year: Never true    [place] in the Last Year: Never true  Transportation Needs: No Transportation Needs (05/07/2021)   PRAPARE - Hydrologist (Medical): No    Lack of Transportation (Non-Medical): No  Physical Activity: Sufficiently Active (05/07/2021)   Exercise Vital Sign    Days of Exercise per Week: 5 days    Minutes of Exercise per Session: 30 min  Stress: No Stress Concern Present (05/07/2021)   [place]    Feeling of Stress : Not at all  Social Connections: Moderately Isolated (05/07/2021)   Social Connection and Isolation Panel [NHANES]    Frequency of Communication with Friends and Family: More than three times a week    Frequency of Social Gatherings with Friends and Family: Three times a week    Attends Religious Services: More than 4 times per year    Active Member of Clubs or Organizations: No    Attends Archivist Meetings: Never    Marital Status: Widowed    Tobacco Counseling Counseling given: Not Answered   Clinical Intake:  Pre-visit preparation completed: Yes  Pain : No/denies pain Pain Score: 7  (RA flair) Pain Type: Acute pain     Diabetes: No  How often do you need to have someone help you when you read instructions, pamphlets, or other written materials from your doctor or pharmacy?: 1 - Never What is the last grade level you completed in school?: 10th grade  Diabetic?No   Activities of Daily Living    05/12/2022    8:42 AM  In your present state of health, do you have any difficulty performing the following activities:  Hearing? 1  Difficulty concentrating or making decisions? 0  Walking or climbing  stairs? 0  Dressing or bathing? 0  Doing errands, shopping? 0    Patient Care Team: Lindell Spar, MD as PCP - General (Internal Medicine) Scot Jun, FNP as Nurse Practitioner (Family Medicine)  Indicate any recent Medical Services you may have received from other than [facility] providers in the past year (date may be approximate).     Assessment:   This is a routine wellness examination for Laurie Wallace.  Hearing/Vision screen No results found.  Dietary issues and exercise activities discussed:     Goals Addressed   None    Depression Screen    05/12/2022    8:42  AM 01/14/2022    8:48 AM 09/09/2021    8:13 AM 07/17/2021    9:42 AM 05/07/2021    9:43 AM 05/07/2021    9:41 AM 02/28/2021    9:04 AM  PHQ 2/9 Scores  PHQ - 2 Score 0 0 0 0 0 0 0    Fall Risk    05/12/2022    8:42 AM 01/14/2022    8:48 AM 09/09/2021    8:13 AM 07/17/2021    9:42 AM 05/07/2021    9:43 AM  Fall Risk   Falls in the past year? 0 0 0 0 0  Number falls in past yr: 0 0 0 0 0  Injury with Fall? 0 0 0 0 0  Risk for fall due to :  No Fall Risks No Fall Risks No Fall Risks No Fall Risks  Follow up  Falls evaluation completed Falls evaluation completed Falls evaluation completed Falls evaluation completed    [place]:  Any stairs in or around the home? Yes  If so, are there any without handrails? Yes  Home free of loose throw rugs in walkways, pet beds, electrical cords, etc? Yes  Adequate lighting in your home to reduce risk of falls? Yes   ASSISTIVE DEVICES UTILIZED TO PREVENT FALLS:  Life alert? No  Use of a cane, walker or w/c? No  Grab bars in the bathroom? No  Shower chair or bench in shower? No  Elevated toilet seat or a handicapped toilet? Yes    Cognitive Function:    05/07/2021    9:44 AM  MMSE - Mini Mental State Exam  Not completed: Unable to complete        05/07/2021    9:44 AM  6CIT Screen  What Year? 0 points  What month? 0 points   What time? 0 points  Count back from 20 4 points  Months in reverse 4 points  Repeat phrase 10 points  Total Score 18 points    Immunizations Immunization History  Administered Date(s) Administered   PNEUMOCOCCAL CONJUGATE-20 02/28/2021    TDAP status: Due, Education has been provided regarding the importance of this vaccine. Advised may receive this vaccine at local pharmacy or Health Dept. Aware to provide a copy of the vaccination record if obtained from local pharmacy or Health Dept. Verbalized acceptance and understanding.  Flu Vaccine status: Due, Education has been provided regarding the importance of this vaccine. Advised may receive this vaccine at local pharmacy or Health Dept. Aware to provide a copy of the vaccination record if obtained from local pharmacy or Health Dept. Verbalized acceptance and understanding. Patient on Methotrexate. She will discuss with Dr.Rice at upcoming appointment a plan for vaccinating.   Pneumococcal vaccine status: Up to date  Covid-19 vaccine status: Completed vaccines  Qualifies for Shingles Vaccine? Yes   Zostavax completed No   Shingrix Completed?: No.    Education has been provided regarding the importance of this vaccine. Patient has been advised to call insurance company to determine out of pocket expense if they have not yet received this vaccine. Advised may also receive vaccine at local pharmacy or Health Dept. Verbalized acceptance and understanding. Needs second vaccine in November  Screening Tests Health Maintenance  Topic Date Due   TETANUS/TDAP  Never done   Zoster Vaccines- Shingrix (1 of 2) Never done   INFLUENZA VACCINE  Never done   Medicare Annual Wellness (AWV)  05/07/2022   Pneumonia Vaccine 65+ Years  old  Completed   DEXA SCAN  Completed   Hepatitis C Screening  Completed   HPV VACCINES  Aged Out   COVID-19 Vaccine  Discontinued    Health Maintenance  Health Maintenance Due  Topic Date Due   TETANUS/TDAP   Never done   Zoster Vaccines- Shingrix (1 of 2) Never done   INFLUENZA VACCINE  Never done   Medicare Annual Wellness (AWV)  05/07/2022    Colorectal cancer screening: Patient has never had colon cancer screening to her knowledge. I can not find a record in chart. She is 76. Discusses risk/benefits. She will follow up with Dr.Patel in January at her physical to discuss further. She would like to do a Cologuard over colonoscopy. We discussed if positive we would recommend Colonoscopy.   Mammogram status: Completed 11/05/2020. Patient deferred until seeing PCP in January.   Bone Density status: Completed 10/28/2020. Results reflect: Bone density results: OSTEOPOROSIS. On fosfomax  Lung Cancer Screening: (Low Dose CT Chest recommended if Age 77-80 years, 30 pack-year currently smoking OR have quit w/in 15years.) does not qualify.    Additional Screening:  Hepatitis C Screening: does not qualify; Completed 12/26/2020  Vision Screening: Recommended annual ophthalmology exams for early detection of glaucoma and other disorders of the eye. Is the patient up to date with their annual eye exam?  Yes  Who is the provider or what is the name of the office in which the patient attends annual eye exams? [facility] If pt is not established with a provider, would they like to be referred to a provider to establish care? No .   Dental Screening: Recommended annual dental exams for proper oral hygiene  Community Resource Referral / Chronic Care Management: CRR required this visit?  No   CCM required this visit?  No      Plan:     I have personally reviewed and noted the following in the patient's chart:   Medical and social history Use of alcohol, tobacco or illicit drugs  Current medications and supplements including opioid prescriptions. Patient is not currently taking opioid prescriptions. Functional ability and status Nutritional status Physical activity Advanced directives List of other  physicians Hospitalizations, surgeries, and ER visits in previous 12 months Vitals Screenings to include cognitive, depression, and falls Referrals and appointments  In addition, I have reviewed and discussed with patient certain preventive protocols, quality metrics, and best practice recommendations. A written personalized care plan for preventive services as well as general preventive health recommendations were provided to patient.     Laurie Dy, MD   05/12/2022

## 2022-05-14 ENCOUNTER — Encounter: Payer: Self-pay | Admitting: *Deleted

## 2022-05-14 ENCOUNTER — Telehealth: Payer: Self-pay | Admitting: *Deleted

## 2022-05-14 DIAGNOSIS — E440 Moderate protein-calorie malnutrition: Secondary | ICD-10-CM

## 2022-05-14 NOTE — Patient Outreach (Signed)
  Care Coordination [facility] Note Transition Care Management Follow-up Telephone Call Date of discharge and from where: Monday, 05/11/22 Laurie Wallace ED visit- rheumatoid arthritis flare; EMMI Red Alert: "No scheduled follow up" How have you been since you were released from the hospital? "I am doing okay; the pain I have is to be expected with this rheumatoid arthritis and I have an appointment to see my Rheumatoid arthritis doctor at the end of the month.  I am doing okay and my granddaughter looks in on me every day; I am able to do everything I need to to take care of myself, I don't need any help.  I am taking my medicine like they  told me to and I have all of my medicine.  I went to my Primary Care doctor yesterday" Any questions or concerns? No  Items Reviewed: Did the pt receive and understand the discharge instructions provided? Yes  Medications obtained and verified? Yes  Other? No  Any new allergies since your discharge? No  Dietary orders reviewed? Yes Do you have support at home? Yes  granddaughter lives nearby and assists as/ if needed/ indicated; patient reports she is independent in self-care, lives alone  Home Care and Equipment/Supplies: Were home health services ordered? no If so, what is the name of the agency? N/A  Has the agency set up a time to come to the patient's home? not applicable Were any new equipment or medical supplies ordered?  No What is the name of the medical supply agency? N/A Were you able to get the supplies/equipment? not applicable Do you have any questions related to the use of the equipment or supplies? No N/A  Functional Questionnaire: (I = Independent and D = Dependent) ADLs: I  granddaughter lives nearby and assists as/ if needed  Bathing/Dressing- I  Meal Prep- I  Eating- I  Maintaining continence- I  Transferring/Ambulation- I  Managing Meds- I  Follow up appointments reviewed:  PCP Hospital f/u appt confirmed? Yes  Confirmed PCP  office visit attended on 05/12/22  Specialist Hospital f/u appt confirmed? Yes  Scheduled to see Rheumatoid arthritis provider, Dr. Benjamine Mola on Monday 06/01/22 @ 1:20 pm Are transportation arrangements needed? No  If their condition worsens, is the pt aware to call PCP or go to the Emergency Dept.? Yes Was the patient provided with contact information for the PCP's office or ED? Yes Was to pt encouraged to call back with questions or concerns? Yes  SDOH assessments and interventions completed:   Yes  Care Coordination Interventions Activated:  Yes   Care Coordination Interventions:  Referred for Care Coordination Services:  Community Resource care Guide for food insecurity    Encounter Outcome:  Pt. Visit Completed    Oneta Rack, RN, BSN, CCRN Alumnus RN CM Care Coordination/ Transition of [place] Management [phone]: direct office

## 2022-05-15 ENCOUNTER — Telehealth: Payer: Self-pay

## 2022-05-15 NOTE — Telephone Encounter (Signed)
   Telephone encounter was:  Successful.  05/15/2022 Name: Laurie Wallace MRN: [id number] DOB: Jun 11, 1946  Laurie Wallace is a 76 y.o. year old female who is a primary care patient of Lindell Spar, MD . The community resource team was consulted for assistance with [place] guide performed the following interventions: Spoke with Ms. Laurie Wallace verified home address to [employer] Letter saved in [place].  Follow Up Plan:  No further follow up planned at this time. The patient has been provided with needed resources.  [place] Resource Care Guide   [email or web address]  ?? [phone]   Website: [email or web address]  [email or web address]

## 2022-06-01 ENCOUNTER — Ambulatory Visit: Payer: Medicare Other | Admitting: Internal Medicine

## 2022-06-01 NOTE — Progress Notes (Deleted)
Office Visit Note  Patient: Laurie Wallace             Date of Birth: 06-14-1946           MRN: [id number]             PCP: Lindell Spar, MD Referring: Lindell Spar, MD Visit Date: 06/01/2022   Subjective:  No chief complaint on file.   History of Present Illness: Laurie Wallace is a 76 y.o. female here for follow up for seropositive RA on MTX 20 mg PO weekly and folic acid 1 mg daily. Since our last visit she had one ED visit for increased right shoulder and hand pain treated with medrol dose pack earlier this month. ***   Previous HPI 02/26/22 Laurie Wallace is a 76 y.o. female here for follow up seropositive RA.  She has been off of the p.o. methotrexate for about a month.  She experienced increased joint pain in shoulders and knees off the medication.  She took a Medrol Dosepak which improved joint symptoms pretty quickly.   Previous HPI 10/13/2021 Laurie Wallace is a 76 y.o. female here for follow up for seropositive RA on methotrexate 20 mg PO weekly. She is doing well since last visit her shoulder pain increases on days with poor weather or if she uses her arms a lot more than usual. Otherwise pain free on some days. She had COVID infection last month treated with paxlovid and tessalon and tylenol with good improvement of symptoms.    Previous HPI 07/10/21 Laurie Wallace is a 76 y.o. female here for follow up for seropositive RA on methotrexate 20 mg PO weekly which was increased after last visit.  She feels symptoms are overall improving she has 0 pain on some days occasionally swelling and stiffness affecting the left arm.  Left shoulder range of motion remains slightly reduced but with much less pain.  She denies any new side effects on increased methotrexate dose.  She has regained about 5 pounds since having some unintentional weight loss last year denies any GI complaints reports normal appetite.   Previous HPI    12/11/20 Laurie Wallace is a 76 y.o. female here for evaluation of joint pains in multiple areas with elevated inflammatory markers.  She believes these symptoms in her shoulder started since about 4 5 months ago without any specific preceding injury or event that she can recall.  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  She does not recall any past significant shoulder injuries or surgeries. Previous treatments tried include cymbalta that she does not notice a significant improvement.  Since last month she was started on oral daily prednisone for the past few weeks and has noticed a significant improvement in her joint symptoms particularly resolution of the hand swelling although does continue having some left shoulder pain.   No Rheumatology ROS completed.   PMFS History:  Patient Active Problem List   Diagnosis Date Noted   Allergic sinusitis 01/14/2022   Encounter for general adult medical examination with abnormal findings 07/17/2021   Age-related osteoporosis without current pathological fracture 07/17/2021   Muscle cramps 02/28/2021   High risk medication use 12/26/2020   Seropositive rheumatoid arthritis ([place]) 10/25/2020   Moderate protein-calorie malnutrition ([place]) 10/25/2020   Cervical spine pain 10/23/2020   Lumbar pain 10/23/2020   Other intervertebral disc displacement, lumbar region  04/07/2010    Past Medical History:  Diagnosis Date   Allergy    Arthritis    COPD (chronic obstructive pulmonary disease) ([place])     Family History  Problem Relation Age of Onset   Cancer Mother    Cancer Father    Heart failure Sister    COPD Sister    Dementia Sister    Past Surgical History:  Procedure Laterality Date   ABDOMINAL HYSTERECTOMY     TOE SURGERY Right    1st toe   Social History   Social History Narrative   Lives with her son   Right Handed   Drinks 1-2  cups caffeine daily in winter   Immunization History  Administered Date(s) Administered   PNEUMOCOCCAL CONJUGATE-20 02/28/2021     Objective: Vital Signs: There were no vitals taken for this visit.   Physical Exam   Musculoskeletal Exam: ***  CDAI Exam: CDAI Score: -- Patient Global: --; Provider Global: -- Swollen: --; Tender: -- Joint Exam 06/01/2022   No joint exam has been documented for this visit   There is currently no information documented on the homunculus. Go to the Rheumatology activity and complete the homunculus joint exam.  Investigation: No additional findings.  Imaging: DG Shoulder Right  Result Date: 05/11/2022 CLINICAL DATA:  Right shoulder pain for 2 weeks.  Arthritis. EXAM: RIGHT SHOULDER - 2+ VIEW COMPARISON:  Right shoulder radiographs 12/11/2020 FINDINGS: There is no evidence of fracture or dislocation. There is no evidence of arthropathy or other focal bone abnormality. Soft tissues are unremarkable. IMPRESSION: Negative. Electronically Signed   By: Logan Bores M.D.   On: 05/11/2022 12:09   DG Elbow Complete Right  Result Date: 05/11/2022 CLINICAL DATA:  Pain, arthritis. EXAM: RIGHT ELBOW - COMPLETE 3+ VIEW COMPARISON:  None Available. FINDINGS: There is no acute fracture or dislocation. Elbow alignment is maintained. The joint spaces are preserved. There is mild degenerative change at the lateral epicondyle. The soft tissues are unremarkable. There is no effusion. IMPRESSION: Mild degenerative change of the lateral epicondyle. No acute finding. Electronically Signed   By: Valetta Mole M.D.   On: 05/11/2022 12:07    Recent Labs: Lab Results  Component Value Date   WBC 11.1 (H) 02/26/2022   HGB 13.6 02/26/2022   PLT 399 02/26/2022   NA 141 02/26/2022   K 4.9 02/26/2022   CL 104 02/26/2022   CO2 27 02/26/2022   GLUCOSE 92 02/26/2022   BUN 16 02/26/2022   CREATININE 0.85 02/26/2022   BILITOT 0.3 02/26/2022   ALKPHOS 126 (H) 10/17/2020   AST 18  02/26/2022   ALT 23 02/26/2022   PROT 7.1 02/26/2022   ALBUMIN 3.9 10/17/2020   CALCIUM 9.1 02/26/2022   GFRAA 88 09/28/2014    Speciality Comments: No specialty comments available.  Procedures:  No procedures performed Allergies: Patient has no known allergies.   Assessment / Plan:     Visit Diagnoses: No diagnosis found.  ***  Orders: No orders of the defined types were placed in this encounter.  No orders of the defined types were placed in this encounter.    Follow-Up Instructions: No follow-ups on file.   Collier Salina, MD  Note - This record has been created using [employer].  Chart creation errors have been sought, but may not always  have been located. Such creation errors do not reflect on  the standard of medical care.

## 2022-06-21 NOTE — Progress Notes (Unsigned)
Office Visit Note  Patient: Laurie Wallace             Date of Birth: [birth year]           MRN: [id number]             PCP: Lindell Spar, MD Referring: Lindell Spar, MD Visit Date: 06/22/2022   Subjective:  No chief complaint on file.   History of Present Illness: Laurie Wallace is a 76 y.o. female here for follow up for seropositive RA on MTX 20 mg PO weekly and folic acid 1 mg daily. ***   Previous HPI 02/26/22 Laurie Wallace is a 76 y.o. female here for follow up seropositive RA.  She has been off of the p.o. methotrexate for about a month.  She experienced increased joint pain in shoulders and knees off the medication.  She took a Medrol Dosepak which improved joint symptoms pretty quickly.   Previous HPI 10/13/2021 Laurie Wallace is a 76 y.o. female here for follow up for seropositive RA on methotrexate 20 mg PO weekly. She is doing well since last visit her shoulder pain increases on days with poor weather or if she uses her arms a lot more than usual. Otherwise pain free on some days. She had COVID infection last month treated with paxlovid and tessalon and tylenol with good improvement of symptoms.    Previous HPI 07/10/21 Laurie Wallace is a 76 y.o. female here for follow up for seropositive RA on methotrexate 20 mg PO weekly which was increased after last visit.  She feels symptoms are overall improving she has 0 pain on some days occasionally swelling and stiffness affecting the left arm.  Left shoulder range of motion remains slightly reduced but with much less pain.  She denies any new side effects on increased methotrexate dose.  She has regained about 5 pounds since having some unintentional weight loss last year denies any GI complaints reports normal appetite.   Previous HPI   12/11/20 Laurie Wallace is a 76 y.o. female here for evaluation of joint pains in multiple areas with elevated inflammatory markers.   She believes these symptoms in her shoulder started since about 4 5 months ago without any specific preceding injury or event that she can recall.  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  She does not recall any past significant shoulder injuries or surgeries. Previous treatments tried include cymbalta that she does not notice a significant improvement.  Since last month she was started on oral daily prednisone for the past few weeks and has noticed a significant improvement in her joint symptoms particularly resolution of the hand swelling although does continue having some left shoulder pain.   LAbs reviewed 10/2020 ANA neg ACE neg RF neg ANCAs neg ESR 68 CRP 25 Alk phos 126 Plts 495 CK 55   No Rheumatology ROS completed.   PMFS History:  Patient Active Problem List   Diagnosis Date Noted   Allergic sinusitis 01/14/2022   Encounter for general adult medical examination with abnormal findings 07/17/2021   Age-related osteoporosis without current pathological fracture 07/17/2021   Muscle cramps 02/28/2021   High risk medication use 12/26/2020   Seropositive rheumatoid arthritis ([place]) 10/25/2020   Moderate protein-calorie malnutrition ([place]) 10/25/2020   Cervical spine pain 10/23/2020   Lumbar pain 10/23/2020   Other intervertebral disc displacement, lumbar region  04/07/2010    Past Medical History:  Diagnosis Date   Allergy    Arthritis    COPD (chronic obstructive pulmonary disease) ([place])     Family History  Problem Relation Age of Onset   Cancer Mother    Cancer Father    Heart failure Sister    COPD Sister    Dementia Sister    Past Surgical History:  Procedure Laterality Date   ABDOMINAL HYSTERECTOMY     TOE SURGERY Right    1st toe   Social History   Social History Narrative   Lives with her son   Right Handed   Drinks 1-2 cups caffeine daily in winter    Immunization History  Administered Date(s) Administered   PNEUMOCOCCAL CONJUGATE-20 02/28/2021     Objective: Vital Signs: There were no vitals taken for this visit.   Physical Exam   Musculoskeletal Exam: ***  CDAI Exam: CDAI Score: -- Patient Global: --; Provider Global: -- Swollen: --; Tender: -- Joint Exam 06/22/2022   No joint exam has been documented for this visit   There is currently no information documented on the homunculus. Go to the Rheumatology activity and complete the homunculus joint exam.  Investigation: No additional findings.  Imaging: No results found.  Recent Labs: Lab Results  Component Value Date   WBC 11.1 (H) 02/26/2022   HGB 13.6 02/26/2022   PLT 399 02/26/2022   NA 141 02/26/2022   K 4.9 02/26/2022   CL 104 02/26/2022   CO2 27 02/26/2022   GLUCOSE 92 02/26/2022   BUN 16 02/26/2022   CREATININE 0.85 02/26/2022   BILITOT 0.3 02/26/2022   ALKPHOS 126 (H) 10/17/2020   AST 18 02/26/2022   ALT 23 02/26/2022   PROT 7.1 02/26/2022   ALBUMIN 3.9 10/17/2020   CALCIUM 9.1 02/26/2022   GFRAA 88 09/28/2014    Speciality Comments: No specialty comments available.  Procedures:  No procedures performed Allergies: Patient has no known allergies.   Assessment / Plan:     Visit Diagnoses: No diagnosis found.  ***  Orders: No orders of the defined types were placed in this encounter.  No orders of the defined types were placed in this encounter.    Follow-Up Instructions: No follow-ups on file.   Collier Salina, MD  Note - This record has been created using [employer].  Chart creation errors have been sought, but may not always  have been located. Such creation errors do not reflect on  the standard of medical care.

## 2022-06-22 ENCOUNTER — Ambulatory Visit: Payer: Medicare Other | Attending: Internal Medicine | Admitting: Internal Medicine

## 2022-06-22 ENCOUNTER — Encounter: Payer: Self-pay | Admitting: Internal Medicine

## 2022-06-22 VITALS — BP 131/67 | HR 76 | Resp 16 | Ht 66.0 in | Wt 126.3 lb

## 2022-06-22 DIAGNOSIS — Z79899 Other long term (current) drug therapy: Secondary | ICD-10-CM | POA: Diagnosis not present

## 2022-06-22 DIAGNOSIS — M059 Rheumatoid arthritis with rheumatoid factor, unspecified: Secondary | ICD-10-CM

## 2022-06-22 MED ORDER — METHOTREXATE SODIUM 2.5 MG PO TABS: 20.0000 mg | ORAL_TABLET | ORAL | 0 refills | Status: DC

## 2022-06-23 LAB — COMPLETE METABOLIC PANEL WITH GFR
AG Ratio: 1.4 (calc) (ref 1.0–2.5)
ALT: 7 U/L (ref 6–29)
AST: 11 U/L (ref 10–35)
Albumin: 4 g/dL (ref 3.6–5.1)
Alkaline phosphatase (APISO): 103 U/L (ref 37–153)
BUN: 12 mg/dL (ref 7–25)
CO2: 29 mmol/L (ref 20–32)
Calcium: 9.2 mg/dL (ref 8.6–10.4)
Chloride: 106 mmol/L (ref 98–110)
Creat: 0.81 mg/dL (ref 0.60–1.00)
Globulin: 2.8 g/dL (calc) (ref 1.9–3.7)
Glucose, Bld: 86 mg/dL (ref 65–99)
Potassium: 4.3 mmol/L (ref 3.5–5.3)
Sodium: 143 mmol/L (ref 135–146)
Total Bilirubin: 0.4 mg/dL (ref 0.2–1.2)
Total Protein: 6.8 g/dL (ref 6.1–8.1)
eGFR: 75 mL/min/{1.73_m2} (ref >=60)

## 2022-06-23 LAB — CBC WITH DIFFERENTIAL/PLATELET
Absolute Monocytes: 607 cells/uL (ref 200–950)
Basophils Absolute: 44 cells/uL (ref 0–200)
Basophils Relative: 0.5 %
Eosinophils Absolute: 246 cells/uL (ref 15–500)
Eosinophils Relative: 2.8 %
HCT: 36.3 % (ref 35.0–45.0)
Hemoglobin: 12.9 g/dL (ref 11.7–15.5)
Lymphs Abs: 1910 cells/uL (ref 850–3900)
MCH: 32.3 pg (ref 27.0–33.0)
MCHC: 35.5 g/dL (ref 32.0–36.0)
MCV: 91 fL (ref 80.0–100.0)
MPV: 10.3 fL (ref 7.5–12.5)
Monocytes Relative: 6.9 %
Neutro Abs: 5993 cells/uL (ref 1500–7800)
Neutrophils Relative %: 68.1 %
Platelets: 391 10*3/uL (ref 140–400)
RBC: 3.99 10*6/uL (ref 3.80–5.10)
RDW: 15.4 % — ABNORMAL HIGH (ref 11.0–15.0)
Total Lymphocyte: 21.7 %
WBC: 8.8 10*3/uL (ref 3.8–10.8)

## 2022-06-23 LAB — SEDIMENTATION RATE: Sed Rate: 33 mm/h — ABNORMAL HIGH (ref 0–30)

## 2022-06-23 NOTE — Progress Notes (Signed)
CBC and CMP are normal with no problems from methotrexate. Sedimentation rate is slightly increased to 33 this might be related to some inflammation at her shoulder but no specific changes needed right now.

## 2022-07-02 ENCOUNTER — Telehealth: Payer: Self-pay | Admitting: Internal Medicine

## 2022-07-02 NOTE — Telephone Encounter (Signed)
Prescription Request  07/02/2022  Is this a "Controlled Substance" medicine? No  LOV: 01/14/2022  What is the name of the medication or equipment? methylPREDNISolone (MEDROL DOSEPAK) 4 MG TBPK tablet   Have you contacted your pharmacy to request a refill? No   Which pharmacy would you like this sent to?  [facility], [place] [address] Phone: [phone] Fax: [phone]    Patient notified that their request is being sent to the clinical staff for review and that they should receive a response within 2 business days.   Please advise at [place]

## 2022-07-02 NOTE — Telephone Encounter (Signed)
Left message for patient to schedule an appt if she needs refill

## 2022-07-07 ENCOUNTER — Ambulatory Visit (INDEPENDENT_AMBULATORY_CARE_PROVIDER_SITE_OTHER): Payer: Medicare Other | Admitting: Internal Medicine

## 2022-07-07 ENCOUNTER — Ambulatory Visit: Payer: Medicare Other | Admitting: Internal Medicine

## 2022-07-07 ENCOUNTER — Encounter: Payer: Self-pay | Admitting: Internal Medicine

## 2022-07-07 VITALS — BP 138/58 | HR 99 | Ht 66.0 in | Wt 129.0 lb

## 2022-07-07 DIAGNOSIS — L03113 Cellulitis of right upper limb: Secondary | ICD-10-CM

## 2022-07-07 NOTE — Progress Notes (Signed)
     HPI:Laurie Wallace is a 77 y.o. female who presents for follow up of right arm cellulitis. For the details of today's visit, please refer to the assessment and plan.   Past Medical History:  Diagnosis Date   Allergy    Arthritis    COPD (chronic obstructive pulmonary disease) (HCC)      Physical Exam: Vitals:   07/07/22 1548  BP: (!) 138/58  Pulse: 99  SpO2: 98%  Weight: 129 lb (58.5 kg)  Height: 5\' 6"  (1.676 m)     Physical Exam Constitutional:      Appearance: Laurie Wallace is well-developed and well-groomed.  Eyes:     General: No scleral icterus.    Conjunctiva/sclera: Conjunctivae normal.  Cardiovascular:     Rate and Rhythm: Normal rate and regular rhythm.     Heart sounds: No murmur heard.    No friction rub. No gallop.  Pulmonary:     Effort: Pulmonary effort is normal.     Breath sounds: No wheezing, rhonchi or rales.  Musculoskeletal:     Right lower leg: No edema.     Left lower leg: No edema.  Skin:    Comments: Erythematous patch on right wrist      Assessment & Plan:   Cellulitis of right arm Patient seen at [facility] for right arm cellulitis vs RA flair. Patient describes redness from wrist joint extending to upper arm. Started on doxycycline and prednisone. Improvement in pain and erythema.   Assessment/Plan: Acute uncomplicated illness.  - Continue antibiotics and prednisone as prescribed.  - Work excuse provided - Follow up if not better after treatments    Milus Banister, MD

## 2022-07-07 NOTE — Patient Instructions (Addendum)
Thank you for trusting me with your care. To recap, today we discussed the following:  Cellulitis of right upper extremity - Continue antibiotics and prednisone as prescribed.  - Work excuse provided - Follow up if not better after treatment

## 2022-07-09 DIAGNOSIS — L03113 Cellulitis of right upper limb: Secondary | ICD-10-CM | POA: Insufficient documentation

## 2022-07-09 NOTE — Assessment & Plan Note (Signed)
Patient seen at [facility] for right arm cellulitis vs RA flair. Patient describes redness from wrist joint extending to upper arm. Started on doxycycline and prednisone. Improvement in pain and erythema.   Assessment/Plan: Acute uncomplicated illness.  - Continue antibiotics and prednisone as prescribed.  - Work excuse provided - Follow up if not better after treatments

## 2022-07-20 ENCOUNTER — Encounter: Payer: Self-pay | Admitting: Internal Medicine

## 2022-07-20 ENCOUNTER — Ambulatory Visit (INDEPENDENT_AMBULATORY_CARE_PROVIDER_SITE_OTHER): Payer: Medicare Other | Admitting: Internal Medicine

## 2022-07-20 VITALS — BP 128/68 | HR 80 | Ht 66.0 in | Wt 127.8 lb

## 2022-07-20 DIAGNOSIS — Z0001 Encounter for general adult medical examination with abnormal findings: Secondary | ICD-10-CM

## 2022-07-20 DIAGNOSIS — M059 Rheumatoid arthritis with rheumatoid factor, unspecified: Secondary | ICD-10-CM | POA: Diagnosis not present

## 2022-07-20 DIAGNOSIS — M810 Age-related osteoporosis without current pathological fracture: Secondary | ICD-10-CM | POA: Diagnosis not present

## 2022-07-20 DIAGNOSIS — L729 Follicular cyst of the skin and subcutaneous tissue, unspecified: Secondary | ICD-10-CM | POA: Diagnosis not present

## 2022-07-20 DIAGNOSIS — Z2821 Immunization not carried out because of patient refusal: Secondary | ICD-10-CM

## 2022-07-20 NOTE — Assessment & Plan Note (Signed)
DEXA scan reviewed Was placed on alendronate, she needs to get refills from pharmacy. Continue Caltrate 600 + D3

## 2022-07-20 NOTE — Assessment & Plan Note (Addendum)
Right forearm bump likely subcutaneous cyst Was recently treated with doxycycline, could be infected cyst - pain and swelling improved now Advised to apply warm compresses for now for pain Referred to dermatology

## 2022-07-20 NOTE — Assessment & Plan Note (Signed)
Followed by Rheumatology - last note reviewed Symptoms were better with Methotrexate, but needs to continue taking it Continue folic acid

## 2022-07-20 NOTE — Patient Instructions (Signed)
Please start taking Alendronate for osteoporosis.  Please continue taking other medications as prescribed.  You are being referred to Dermatology for skin cyst.

## 2022-07-20 NOTE — Assessment & Plan Note (Signed)
Physical exam as documented. Counseling done  re healthy lifestyle involving commitment to 150 minutes exercise per week, heart healthy diet, and attaining healthy weight.The importance of adequate sleep also discussed. Changes in health habits are decided on by the patient with goals and time frames  set for achieving them. Immunization and cancer screening needs are specifically addressed at this visit.  Refused flu vaccine. Advised to get Shingrix vaccine at local pharmacy.

## 2022-07-20 NOTE — Progress Notes (Signed)
Established Patient Office Visit  Subjective:  Patient ID: Laurie Wallace, female    DOB: 1945-08-26  Age: 77 y.o. MRN: [id number]  CC:  Chief Complaint  Patient presents with   Annual Exam    Patient seen UC for her wrist, they told her to discuss with PCP about a possible xray being done    HPI Laurie Wallace is a 77 y.o. female with past medical history of RA and osteoporosis who presents for annual physical.  She is on Methotrexate for RA and has been feeling better now. She is followed by Rheumatology. Last blood tests were reviewed.  She reports having a bump on her right forearm X 2 weeks.  She was recently given doxycycline for cellulitis, which has improved the swelling, but she still complains of tenderness in the area.  No erythema noted today.  She denies any recent injury.  She had swelling of the right wrist about 2 weeks ago, which has improved with prednisone.  Denies any fever or chills.  Denies any recent insect bite.  She refused flu vaccine in the office today.  Past Medical History:  Diagnosis Date   Allergy    Arthritis    COPD (chronic obstructive pulmonary disease) (HCC)     Past Surgical History:  Procedure Laterality Date   ABDOMINAL HYSTERECTOMY     TOE SURGERY Right    1st toe    Family History  Problem Relation Age of Onset   Cancer Mother    Cancer Father    Heart failure Sister    COPD Sister    Dementia Sister     Social History   Socioeconomic History   Marital status: Single    Spouse name: Not on file   Number of children: Not on file   Years of education: Not on file   Highest education level: Not on file  Occupational History   Occupation: full time  Tobacco Use   Smoking status: Never    Passive exposure: Never   Smokeless tobacco: Never  Vaping Use   Vaping Use: Never used  Substance and Sexual Activity   Alcohol use: No   Drug use: No   Sexual activity: Not on file  Other Topics Concern    Not on file  Social History Narrative   Lives with her son   Right Handed   Drinks 1-2 cups caffeine daily in winter   Social Determinants of Health   Financial Resource Strain: Low Risk  (05/07/2021)   Overall Financial Resource Strain (CARDIA)    Difficulty of Paying Living Expenses: Not hard at all  Food Insecurity: Food Insecurity Present (05/15/2022)   Hunger Vital Sign    Worried About Running Out of Food in the Last Year: Sometimes true    Ran Out of Food in the Last Year: Never true  Transportation Needs: No Transportation Needs (05/14/2022)   PRAPARE - Hydrologist (Medical): No    Lack of Transportation (Non-Medical): No  Physical Activity: Sufficiently Active (05/07/2021)   Exercise Vital Sign    Days of Exercise per Week: 5 days    Minutes of Exercise per Session: 30 min  Stress: No Stress Concern Present (05/07/2021)   [place]    Feeling of Stress : Not at all  Social Connections: Moderately Isolated (05/07/2021)   Social Connection and Isolation Panel [NHANES]    Frequency of Communication with Friends and  Family: More than three times a week    Frequency of Social Gatherings with Friends and Family: Three times a week    Attends Religious Services: More than 4 times per year    Active Member of Clubs or Organizations: No    Attends Archivist Meetings: Never    Marital Status: Widowed  Intimate Partner Violence: Not At Risk (05/07/2021)   Humiliation, Afraid, Rape, and Kick questionnaire    Fear of Current or Ex-Partner: No    Emotionally Abused: No    Physically Abused: No    Sexually Abused: No    Outpatient Medications Prior to Visit  Medication Sig Dispense Refill   alendronate (FOSAMAX) 70 MG tablet Take 1 tablet (70 mg total) by mouth every 7 (seven) days. Take with a full glass of water on an empty stomach. 4 tablet 11   Calcium Carbonate-Vit D-Min  (CALTRATE 600+D PLUS MINERALS) 600-800 MG-UNIT CHEW Chew 1 tablet by mouth daily. 30 tablet 5   cyclobenzaprine (FLEXERIL) 5 MG tablet Take 1 tablet (5 mg total) by mouth 3 (three) times daily as needed for muscle spasms. 30 tablet 1   folic acid (FOLVITE) 1 MG tablet Take 1 tablet (1 mg total) by mouth daily. 90 tablet 3   levocetirizine (XYZAL) 5 MG tablet Take 1 tablet (5 mg total) by mouth every evening. 30 tablet 5   methotrexate (RHEUMATREX) 2.5 MG tablet Take 8 tablets (20 mg total) by mouth once a week. Caution:Chemotherapy. Protect from light. 104 tablet 0   No facility-administered medications prior to visit.    No Known Allergies  ROS Review of Systems  Constitutional:  Negative for chills and fever.  HENT:  Negative for congestion, sinus pressure, sinus pain and sore throat.   Eyes:  Negative for pain and discharge.  Respiratory:  Negative for cough and shortness of breath.   Cardiovascular:  Negative for chest pain and palpitations.  Gastrointestinal:  Negative for abdominal pain, constipation, diarrhea, nausea and vomiting.  Endocrine: Negative for polydipsia and polyuria.  Genitourinary:  Negative for dysuria and hematuria.  Musculoskeletal:  Positive for arthralgias, back pain and myalgias. Negative for neck pain and neck stiffness.  Skin:  Negative for rash.  Neurological:  Negative for dizziness and weakness.  Psychiatric/Behavioral:  Negative for agitation and behavioral problems.       Objective:    Physical Exam Vitals reviewed.  Constitutional:      General: She is not in acute distress.    Appearance: She is not diaphoretic.  HENT:     Head: Normocephalic and atraumatic.     Nose: Nose normal.     Mouth/Throat:     Mouth: Mucous membranes are moist.  Eyes:     General: No scleral icterus.    Extraocular Movements: Extraocular movements intact.  Cardiovascular:     Rate and Rhythm: Normal rate and regular rhythm.     Pulses: Normal pulses.     Heart  sounds: Normal heart sounds. No murmur heard. Pulmonary:     Breath sounds: Normal breath sounds. No wheezing or rales.  Abdominal:     Palpations: Abdomen is soft.     Tenderness: There is no abdominal tenderness.  Musculoskeletal:        General: Deformity (Small joints of b/l hands with mild ulnar deviation) present.     Cervical back: Neck supple. No tenderness.     Right lower leg: No edema.     Left lower leg: No edema.  Skin:    General: Skin is warm.     Findings: No rash.     Comments: About 2 cm in diameter slightly tender mass on dorsal aspect of right forearm, no erythema or surrounding edema  Neurological:     General: No focal deficit present.     Mental Status: She is alert and oriented to person, place, and time.     Cranial Nerves: No cranial nerve deficit.     Sensory: No sensory deficit.     Motor: No weakness.  Psychiatric:        Mood and Affect: Mood normal.        Behavior: Behavior normal.     BP 128/68 (BP Location: Right Arm, Cuff Size: Normal)   Pulse 80   Ht '5\' 6"'$  (1.676 m)   Wt 127 lb 12.8 oz (58 kg)   SpO2 98%   BMI 20.63 kg/m  Wt Readings from Last 3 Encounters:  07/20/22 127 lb 12.8 oz (58 kg)  07/07/22 129 lb (58.5 kg)  06/22/22 126 lb 4.8 oz (57.3 kg)    Lab Results  Component Value Date   TSH 1.950 10/17/2020   Lab Results  Component Value Date   WBC 8.8 06/22/2022   HGB 12.9 06/22/2022   HCT 36.3 06/22/2022   MCV 91.0 06/22/2022   PLT 391 06/22/2022   Lab Results  Component Value Date   NA 143 06/22/2022   K 4.3 06/22/2022   CO2 29 06/22/2022   GLUCOSE 86 06/22/2022   BUN 12 06/22/2022   CREATININE 0.81 06/22/2022   BILITOT 0.4 06/22/2022   ALKPHOS 126 (H) 10/17/2020   AST 11 06/22/2022   ALT 7 06/22/2022   PROT 6.8 06/22/2022   ALBUMIN 3.9 10/17/2020   CALCIUM 9.2 06/22/2022   EGFR 75 06/22/2022   No results found for: "CHOL" No results found for: "HDL" No results found for: "LDLCALC" No results found for:  "TRIG" No results found for: "CHOLHDL" No results found for: "HGBA1C"    Assessment & Plan:   Problem List Items Addressed This Visit       Musculoskeletal and Integument   Seropositive rheumatoid arthritis ([place])    Followed by Rheumatology - last note reviewed Symptoms were better with Methotrexate, but needs to continue taking it Continue folic acid      Age-related osteoporosis without current pathological fracture    DEXA scan reviewed Was placed on alendronate, she needs to get refills from pharmacy. Continue Caltrate 600 + D3        Other   Encounter for general adult medical examination with abnormal findings - Primary    Physical exam as documented. Counseling done  re healthy lifestyle involving commitment to 150 minutes exercise per week, heart healthy diet, and attaining healthy weight.The importance of adequate sleep also discussed. Changes in health habits are decided on by the patient with goals and time frames  set for achieving them. Immunization and cancer screening needs are specifically addressed at this visit.  Refused flu vaccine. Advised to get Shingrix vaccine at local pharmacy.      Subcutaneous cyst    Right forearm bump likely subcutaneous cyst Was recently treated with doxycycline, could be infected cyst - pain and swelling improved now Advised to apply warm compresses for now for pain Referred to dermatology      Relevant Orders   Ambulatory referral to Dermatology   Other Visit Diagnoses     Refused influenza vaccine  No orders of the defined types were placed in this encounter.   Follow-up: Return in about 6 months (around 01/18/2023) for RA and osteoporosis.    Lindell Spar, MD

## 2022-09-20 NOTE — Progress Notes (Signed)
Office Visit Note  Patient: Laurie Wallace             Date of Birth: 07-29-1945           MRN: [id number]             PCP: Lindell Spar, MD Referring: Lindell Spar, MD Visit Date: 09/21/2022   Subjective:  Follow-up   History of Present Illness: Laurie Wallace is a 77 y.o. female here for follow up for seropositive RA on methotrexate 20 mg p.o. weekly and folic acid 1 mg daily.  Symptoms were doing pretty well with some ongoing left shoulder pain around baseline until acutely developed pain and swelling on the back of her right forearm and wrist at the end of December.  Did not recall any preceding illness no preceding injury to the site or change in usual activities.  Concern was for cellulitis versus RA flareup and treated with course of oral doxycycline and oral prednisone medication for 5 days.  She felt symptoms almost entirely cleared up after the first 2 days of treatment.  There is still been a small persistent swelling or nodule on the affected area ever since. Occasionally notices tenderness at the site but no pain with use.  Previous HPI 06/22/22  Laurie Wallace is a 77 y.o. female here for follow up for seropositive RA on MTX 20 mg PO weekly and folic acid 1 mg daily. She is doing well recently, slightly less busy with work so less lifting and shoulder pain is improved. Right upper arm still with some pain and stiffness. Not seeing any swelling and hand and knee pain is all doing well. Notices small increase in symptoms with the recent cooler weather usually just with the rain.   Previous HPI 02/26/22 Laurie Wallace is a 77 y.o. female here for follow up seropositive RA.  She has been off of the p.o. methotrexate for about a month.  She experienced increased joint pain in shoulders and knees off the medication.  She took a Medrol Dosepak which improved joint symptoms pretty quickly.   Previous HPI 10/13/2021 Laurie Reischl  Wallace is a 77 y.o. female here for follow up for seropositive RA on methotrexate 20 mg PO weekly. She is doing well since last visit her shoulder pain increases on days with poor weather or if she uses her arms a lot more than usual. Otherwise pain free on some days. She had COVID infection last month treated with paxlovid and tessalon and tylenol with good improvement of symptoms.    Previous HPI 07/10/21 Laurie Wallace is a 77 y.o. female here for follow up for seropositive RA on methotrexate 20 mg PO weekly which was increased after last visit.  She feels symptoms are overall improving she has 0 pain on some days occasionally swelling and stiffness affecting the left arm.  Left shoulder range of motion remains slightly reduced but with much less pain.  She denies any new side effects on increased methotrexate dose.  She has regained about 5 pounds since having some unintentional weight loss last year denies any GI complaints reports normal appetite.   Previous HPI   12/11/20 Laurie Wallace is a 77 y.o. female here for evaluation of joint pains in multiple areas with elevated inflammatory markers.  She believes these symptoms in her shoulder started since about 4 5 months ago without any specific preceding injury or event that she can recall.  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  She does not recall any past significant shoulder injuries or surgeries. Previous treatments tried include cymbalta that she does not notice a significant improvement.  Since last month she was started on oral daily prednisone for the past few weeks and has noticed a significant improvement in her joint symptoms particularly resolution of the hand swelling although does continue having some left shoulder pain.   LAbs reviewed 10/2020 ANA neg ACE neg RF neg ANCAs neg ESR 68 CRP 25 Alk phos  126 Plts 495 CK 55   Review of Systems  Constitutional:  Negative for fatigue.  HENT:  Positive for mouth dryness. Negative for mouth sores.   Eyes:  Negative for dryness.  Respiratory:  Positive for shortness of breath.   Cardiovascular:  Negative for chest pain and palpitations.  Gastrointestinal:  Positive for constipation. Negative for blood in stool and diarrhea.  Endocrine: Negative for increased urination.  Genitourinary:  Negative for involuntary urination.  Musculoskeletal:  Positive for joint pain, joint pain, joint swelling and morning stiffness. Negative for gait problem, myalgias, muscle weakness, muscle tenderness and myalgias.  Skin:  Negative for color change, rash, hair loss and sensitivity to sunlight.  Allergic/Immunologic: Negative for susceptible to infections.  Neurological:  Negative for dizziness and headaches.  Hematological:  Negative for swollen glands.  Psychiatric/Behavioral:  Positive for sleep disturbance. Negative for depressed mood. The patient is nervous/anxious.     PMFS History:  Patient Active Problem List   Diagnosis Date Noted   Localized swelling of right forearm 09/21/2022   Subcutaneous cyst 07/20/2022   Allergic sinusitis 01/14/2022   Encounter for general adult medical examination with abnormal findings 07/17/2021   Age-related osteoporosis without current pathological fracture 07/17/2021   Muscle cramps 02/28/2021   High risk medication use 12/26/2020   Seropositive rheumatoid arthritis ([place]) 10/25/2020   Moderate protein-calorie malnutrition ([place]) 10/25/2020   Cervical spine pain 10/23/2020   Lumbar pain 10/23/2020   Other intervertebral disc displacement, lumbar region 04/07/2010    Past Medical History:  Diagnosis Date   Allergy    Arthritis    COPD (chronic obstructive pulmonary disease) ([place])     Family History  Problem Relation Age of Onset   Cancer Mother    Cancer Father    Heart failure Sister    COPD Sister     Dementia Sister    Past Surgical History:  Procedure Laterality Date   ABDOMINAL HYSTERECTOMY     TOE SURGERY Right    1st toe   Social History   Social History Narrative   Lives with her son   Right Handed   Drinks 1-2 cups caffeine daily in winter   Immunization History  Administered Date(s) Administered   PNEUMOCOCCAL CONJUGATE-20 02/28/2021     Objective: Vital Signs: BP 122/74 (BP Location: Left Arm, Patient Position: Sitting, Cuff Size: Normal)   Pulse 77   Resp 14   Ht 5\' 6"  (1.676 m)   Wt 125 lb (56.7 kg)   BMI 20.18 kg/m    Physical Exam Cardiovascular:     Rate and Rhythm: Normal rate and regular rhythm.  Pulmonary:     Effort: Pulmonary effort is normal.     Breath sounds: Normal breath sounds.  Skin:    General: Skin is warm and dry.  Neurological:     Mental Status: She is alert.  Psychiatric:  Mood and Affect: Mood normal.      Musculoskeletal Exam:  Shoulders full ROM left shoulder pain with full abduction and tenderness to pressure, no swelling Elbows full ROM no tenderness or swelling Wrists full ROM no tenderness or swelling Small nodule on right forearm extensor surface proximal to wrist, immobile Fingers full ROM no tenderness or swelling Knees full ROM no tenderness or swelling Negative MTP squeeze tenderness  CDAI Exam: CDAI Score: 7  Patient Global: 20 mm; Provider Global: 30 mm Swollen: 0 ; Tender: 2  Joint Exam 09/21/2022      Right  Left  Glenohumeral      Tender  Wrist   Tender        Investigation: No additional findings.  Imaging: No results found.  Recent Labs: Lab Results  Component Value Date   WBC 8.8 06/22/2022   HGB 12.9 06/22/2022   PLT 391 06/22/2022   NA 143 06/22/2022   K 4.3 06/22/2022   CL 106 06/22/2022   CO2 29 06/22/2022   GLUCOSE 86 06/22/2022   BUN 12 06/22/2022   CREATININE 0.81 06/22/2022   BILITOT 0.4 06/22/2022   ALKPHOS 126 (H) 10/17/2020   AST 11 06/22/2022   ALT 7  06/22/2022   PROT 6.8 06/22/2022   ALBUMIN 3.9 10/17/2020   CALCIUM 9.2 06/22/2022   GFRAA 88 09/28/2014    Speciality Comments: No specialty comments available.  Procedures:  No procedures performed Allergies: Patient has no known allergies.   Assessment / Plan:     Visit Diagnoses: Seropositive rheumatoid arthritis ([place]) - Plan: C-reactive protein, Sedimentation rate, methotrexate (RHEUMATREX) 2.5 MG tablet  Inflammation appears well-controlled today.  She had the interval episode with forearm pain and swelling.  Exam today does still shows appears to be a small cyst though not directly associated with any specific tendon or joint structure.  Checking sed rate and CRP for disease activity monitoring.  Plan to continue methotrexate 20 mg p.o. weekly folic acid 1 mg daily.  High risk medication use - Plan: CBC with Differential/Platelet, COMPLETE METABOLIC PANEL WITH GFR  Checking CBC and CMP for medication monitoring on continued methotrexate.  No serious interval infections confirmed although there was a concern about possible cellulitis with her right forearm episode.  Localized swelling of right forearm  Not entirely clear what provoked this swelling, I suspect most likely RA disease activity.  No red flags for an active infection on exam today.  There is a small cyst organized fluid collection but is very small with minimal symptoms do not recommend aspiration.  Recommended she contact us again ASAP if starting to increase symptoms again.  Orders: Orders Placed This Encounter  Procedures   C-reactive protein   Sedimentation rate   CBC with Differential/Platelet   COMPLETE METABOLIC PANEL WITH GFR   Meds ordered this encounter  Medications   methotrexate (RHEUMATREX) 2.5 MG tablet    Sig: Take 8 tablets (20 mg total) by mouth once a week. Caution:Chemotherapy. Protect from light.    Dispense:  104 tablet    Refill:  0     Follow-Up Instructions: Return in about 3 months  (around 12/22/2022) for RA on MTX f/u 32mos.   Collier Salina, MD  Note - This record has been created using [employer].  Chart creation errors have been sought, but may not always  have been located. Such creation errors do not reflect on  the standard of medical care.

## 2022-09-21 ENCOUNTER — Ambulatory Visit: Payer: Medicare HMO | Attending: Internal Medicine | Admitting: Internal Medicine

## 2022-09-21 ENCOUNTER — Encounter: Payer: Self-pay | Admitting: Internal Medicine

## 2022-09-21 VITALS — BP 122/74 | HR 77 | Resp 14 | Ht 66.0 in | Wt 125.0 lb

## 2022-09-21 DIAGNOSIS — Z79899 Other long term (current) drug therapy: Secondary | ICD-10-CM | POA: Diagnosis not present

## 2022-09-21 DIAGNOSIS — R2231 Localized swelling, mass and lump, right upper limb: Secondary | ICD-10-CM

## 2022-09-21 DIAGNOSIS — M059 Rheumatoid arthritis with rheumatoid factor, unspecified: Secondary | ICD-10-CM

## 2022-09-21 MED ORDER — METHOTREXATE SODIUM 2.5 MG PO TABS: 20.0000 mg | ORAL_TABLET | ORAL | 0 refills | Status: DC

## 2022-09-22 LAB — COMPLETE METABOLIC PANEL WITH GFR
AG Ratio: 1.3 (calc) (ref 1.0–2.5)
ALT: 6 U/L (ref 6–29)
AST: 10 U/L (ref 10–35)
Albumin: 4 g/dL (ref 3.6–5.1)
Alkaline phosphatase (APISO): 103 U/L (ref 37–153)
BUN: 9 mg/dL (ref 7–25)
CO2: 29 mmol/L (ref 20–32)
Calcium: 9.2 mg/dL (ref 8.6–10.4)
Chloride: 106 mmol/L (ref 98–110)
Creat: 0.71 mg/dL (ref 0.60–1.00)
Globulin: 3 g/dL (calc) (ref 1.9–3.7)
Glucose, Bld: 95 mg/dL (ref 65–99)
Potassium: 4.9 mmol/L (ref 3.5–5.3)
Sodium: 141 mmol/L (ref 135–146)
Total Bilirubin: 0.3 mg/dL (ref 0.2–1.2)
Total Protein: 7 g/dL (ref 6.1–8.1)
eGFR: 88 mL/min/{1.73_m2} (ref >=60)

## 2022-09-22 LAB — CBC WITH DIFFERENTIAL/PLATELET
Absolute Monocytes: 761 cells/uL (ref 200–950)
Basophils Absolute: 38 cells/uL (ref 0–200)
Basophils Relative: 0.4 %
Eosinophils Absolute: 254 cells/uL (ref 15–500)
Eosinophils Relative: 2.7 %
HCT: 39.2 % (ref 35.0–45.0)
Hemoglobin: 13.5 g/dL (ref 11.7–15.5)
Lymphs Abs: 2002 cells/uL (ref 850–3900)
MCH: 31.1 pg (ref 27.0–33.0)
MCHC: 34.4 g/dL (ref 32.0–36.0)
MCV: 90.3 fL (ref 80.0–100.0)
MPV: 10.6 fL (ref 7.5–12.5)
Monocytes Relative: 8.1 %
Neutro Abs: 6345 cells/uL (ref 1500–7800)
Neutrophils Relative %: 67.5 %
Platelets: 443 10*3/uL — ABNORMAL HIGH (ref 140–400)
RBC: 4.34 10*6/uL (ref 3.80–5.10)
RDW: 15.6 % — ABNORMAL HIGH (ref 11.0–15.0)
Total Lymphocyte: 21.3 %
WBC: 9.4 10*3/uL (ref 3.8–10.8)

## 2022-09-22 LAB — SEDIMENTATION RATE: Sed Rate: 41 mm/h — ABNORMAL HIGH (ref 0–30)

## 2022-09-22 LAB — C-REACTIVE PROTEIN: CRP: 9.5 mg/L — ABNORMAL HIGH (ref <8.0)

## 2022-09-22 NOTE — Progress Notes (Signed)
Sed rate is 41 slightly increased from 33 at last visit. Platelet count of 443 is slightly high as well. I suspect there is some RA disease activity but I don't think we need to change medications unless she has some increase in symptoms such as the right wrist swelling again.

## 2022-10-21 ENCOUNTER — Other Ambulatory Visit: Payer: Self-pay | Admitting: Internal Medicine

## 2022-10-21 ENCOUNTER — Telehealth: Payer: Self-pay | Admitting: Internal Medicine

## 2022-10-21 DIAGNOSIS — M059 Rheumatoid arthritis with rheumatoid factor, unspecified: Secondary | ICD-10-CM

## 2022-10-21 MED ORDER — MELOXICAM 7.5 MG PO TABS: 7.5000 mg | ORAL_TABLET | Freq: Every day | ORAL | 0 refills | Status: DC

## 2022-10-21 NOTE — Telephone Encounter (Signed)
Spoke to patient

## 2022-10-21 NOTE — Telephone Encounter (Signed)
Pt called in requesting something for pain, pt states her shoulders are giving her issues again. Wants a call back.

## 2022-10-28 ENCOUNTER — Ambulatory Visit (INDEPENDENT_AMBULATORY_CARE_PROVIDER_SITE_OTHER): Payer: Medicare HMO | Admitting: Family Medicine

## 2022-10-28 ENCOUNTER — Encounter: Payer: Self-pay | Admitting: Family Medicine

## 2022-10-28 VITALS — BP 101/62 | HR 97 | Ht 66.0 in | Wt 122.0 lb

## 2022-10-28 DIAGNOSIS — J069 Acute upper respiratory infection, unspecified: Secondary | ICD-10-CM

## 2022-10-28 MED ORDER — BENZONATATE 100 MG PO CAPS: 100.0000 mg | ORAL_CAPSULE | Freq: Two times a day (BID) | ORAL | 0 refills | Status: DC | PRN

## 2022-10-28 MED ORDER — AMOXICILLIN-POT CLAVULANATE 875-125 MG PO TABS: 1.0000 | ORAL_TABLET | Freq: Two times a day (BID) | ORAL | 0 refills | Status: AC

## 2022-10-28 MED ORDER — PREDNISONE 20 MG PO TABS: 20.0000 mg | ORAL_TABLET | Freq: Two times a day (BID) | ORAL | 0 refills | Status: AC

## 2022-10-28 MED ORDER — AMOXICILLIN-POT CLAVULANATE 875-125 MG PO TABS: 1.0000 | ORAL_TABLET | Freq: Two times a day (BID) | ORAL | 0 refills | Status: DC

## 2022-10-28 NOTE — Assessment & Plan Note (Signed)
Augmentin 875-125 mg x 7 days Prednisone 20 mg x 5 days, Benzonanate 100 mg PRN for cough Discussed symptomatic treatment, rest, increase oral fluid intake. Follow-up for worsening or persistent symptoms. Patient verbalizes understanding regarding plan of care and all questions answered

## 2022-10-28 NOTE — Progress Notes (Signed)
Patient Office Visit   Subjective   Patient ID: Laurie Wallace, female    DOB: [birth year]  Age: 77 y.o. MRN: [id number]  CC:  Chief Complaint  Patient presents with   Cough    Pt c/o cough, nose bleeding, dizziness, weakness, sweats x 4 days;     HPI Laurie Wallace 77 year old female, presents to the clinic for cough and sweats for the past 4 days.She  has a past medical history of Allergy, Arthritis, and COPD (chronic obstructive pulmonary disease).  Patient here evaluation of fever. Patient describes symptoms of chills without rigors, cough, nausea,headache, malaise, myalgias, sputum production, and sweats. Symptoms began 4 days ago and are gradually worsening since that time. Patient denies sinus pressure, dyspnea or vomiting. Treatment thus far includes OTC analgesics/antipyretics: not very effective Past pulmonary history is significant for no history of pneumonia or bronchitis.     Outpatient Encounter Medications as of 10/28/2022  Medication Sig   alendronate (FOSAMAX) 70 MG tablet Take 1 tablet (70 mg total) by mouth every 7 (seven) days. Take with a full glass of water on an empty stomach.   benzonatate (TESSALON) 100 MG capsule Take 1 capsule (100 mg total) by mouth 2 (two) times daily as needed for cough.   Calcium Carbonate-Vit D-Min (CALTRATE 600+D PLUS MINERALS) 600-800 MG-UNIT CHEW Chew 1 tablet by mouth daily.   cyclobenzaprine (FLEXERIL) 5 MG tablet Take 1 tablet (5 mg total) by mouth 3 (three) times daily as needed for muscle spasms.   folic acid (FOLVITE) 1 MG tablet Take 1 tablet (1 mg total) by mouth daily.   levocetirizine (XYZAL) 5 MG tablet Take 1 tablet (5 mg total) by mouth every evening.   meloxicam (MOBIC) 7.5 MG tablet Take 1 tablet (7.5 mg total) by mouth daily.   methotrexate (RHEUMATREX) 2.5 MG tablet Take 8 tablets (20 mg total) by mouth once a week. Caution:Chemotherapy. Protect from light.   predniSONE (DELTASONE) 20 MG tablet  Take 1 tablet (20 mg total) by mouth 2 (two) times daily with breakfast and lunch for 5 days.   [DISCONTINUED] amoxicillin-clavulanate (AUGMENTIN) 875-125 MG tablet Take 1 tablet by mouth 2 (two) times daily.   [DISCONTINUED] predniSONE (DELTASONE) 10 MG tablet 6-day taper; 6 pills day one, 5 pills day 2, 4 pills day 3, etc. Follow package instructions.   amoxicillin-clavulanate (AUGMENTIN) 875-125 MG tablet Take 1 tablet by mouth 2 (two) times daily for 7 days.   [DISCONTINUED] brompheniramine (VAZOL) 2 MG/5ML LIQD Take 2 mg by mouth 2 (two) times daily.   [DISCONTINUED] fluticasone (FLONASE) 50 MCG/ACT nasal spray Place 2 sprays into the nose daily.   No facility-administered encounter medications on file as of 10/28/2022.    Past Surgical History:  Procedure Laterality Date   ABDOMINAL HYSTERECTOMY     TOE SURGERY Right    1st toe    Review of Systems  Constitutional:  Positive for chills and fever.  Respiratory:  Positive for cough, sputum production, shortness of breath and wheezing. Negative for hemoptysis.   Cardiovascular:  Negative for chest pain.  Gastrointestinal:  Negative for abdominal pain.  Genitourinary:  Negative for dysuria.  Musculoskeletal:  Negative for myalgias.  Neurological:  Positive for dizziness and headaches.      Objective    BP 101/62   Pulse 97   Ht  (1.676 m)   Wt 122 lb (55.3 kg)   SpO2 99%   BMI 19.69 kg/m   Physical Exam Vitals  reviewed.  Constitutional:      General: She is not in acute distress.    Appearance: Normal appearance. She is not ill-appearing, toxic-appearing or diaphoretic.  HENT:     Head: Normocephalic.  Eyes:     General:        Right eye: No discharge.        Left eye: No discharge.     Conjunctiva/sclera: Conjunctivae normal.  Cardiovascular:     Rate and Rhythm: Normal rate.     Pulses: Normal pulses.     Heart sounds: Normal heart sounds.  Pulmonary:     Effort: Pulmonary effort is normal. No respiratory  distress.     Breath sounds: Normal breath sounds.  Musculoskeletal:        General: Normal range of motion.     Cervical back: Normal range of motion.  Skin:    General: Skin is warm and dry.     Capillary Refill: Capillary refill takes less than 2 seconds.  Neurological:     General: No focal deficit present.     Mental Status: She is alert and oriented to person, place, and time.     Coordination: Coordination normal.     Gait: Gait normal.  Psychiatric:        Mood and Affect: Mood normal.        Behavior: Behavior normal.       Assessment & Plan:  Upper respiratory tract infection, unspecified type Assessment & Plan: Augmentin 875-125 mg x 7 days Prednisone 20 mg x 5 days, Benzonanate 100 mg PRN for cough Discussed symptomatic treatment, rest, increase oral fluid intake. Follow-up for worsening or persistent symptoms. Patient verbalizes understanding regarding plan of care and all questions answered   Orders: -     predniSONE; Take 1 tablet (20 mg total) by mouth 2 (two) times daily with breakfast and lunch for 5 days.  Dispense: 10 tablet; Refill: 0 -     Benzonatate; Take 1 capsule (100 mg total) by mouth 2 (two) times daily as needed for cough.  Dispense: 20 capsule; Refill: 0 -     Amoxicillin-Pot Clavulanate; Take 1 tablet by mouth 2 (two) times daily for 7 days.  Dispense: 14 tablet; Refill: 0    No follow-ups on file.   Cruzita Lederer Newman Nip, FNP

## 2022-10-28 NOTE — Patient Instructions (Signed)
        Great to see you today.   - Please take medications as prescribed. - Follow up with your primary health provider if any health concerns arises. - If symptoms worsen please contact your primary care provider and/or visit the emergency department.  

## 2022-12-21 NOTE — Progress Notes (Unsigned)
Office Visit Note  Patient: Laurie Wallace             Date of Birth: 09-03-1945           MRN: [id number]             PCP: Anabel Halon, MD Referring: Anabel Halon, MD Visit Date: 12/22/2022   Subjective:  No chief complaint on file.   History of Present Illness: Amdrea Saker is a 77 y.o. female here for follow up ***   Previous HPI 09/21/22 Laurie Wallace is a 77 y.o. female here for follow up for seropositive RA on methotrexate 20 mg p.o. weekly and folic acid 1 mg daily.  Symptoms were doing pretty well with some ongoing left shoulder pain around baseline until acutely developed pain and swelling on the back of her right forearm and wrist at the end of December.  Did not recall any preceding illness no preceding injury to the site or change in usual activities.  Concern was for cellulitis versus RA flareup and treated with course of oral doxycycline and oral prednisone medication for 5 days.  She felt symptoms almost entirely cleared up after the first 2 days of treatment.  There is still been a small persistent swelling or nodule on the affected area ever since. Occasionally notices tenderness at the site but no pain with use.   Previous HPI 06/22/22  Laurie Wallace is a 77 y.o. female here for follow up for seropositive RA on MTX 20 mg PO weekly and folic acid 1 mg daily. She is doing well recently, slightly less busy with work so less lifting and shoulder pain is improved. Right upper arm still with some pain and stiffness. Not seeing any swelling and hand and knee pain is all doing well. Notices small increase in symptoms with the recent cooler weather usually just with the rain.   Previous HPI 02/26/22 Laurie Wallace is a 77 y.o. female here for follow up seropositive RA.  She has been off of the p.o. methotrexate for about a month.  She experienced increased joint pain in shoulders and knees off the medication.  She  took a Medrol Dosepak which improved joint symptoms pretty quickly.   Previous HPI 10/13/2021 Laurie Wallace is a 77 y.o. female here for follow up for seropositive RA on methotrexate 20 mg PO weekly. She is doing well since last visit her shoulder pain increases on days with poor weather or if she uses her arms a lot more than usual. Otherwise pain free on some days. She had COVID infection last month treated with paxlovid and tessalon and tylenol with good improvement of symptoms.    Previous HPI 07/10/21 Laurie Wallace is a 77 y.o. female here for follow up for seropositive RA on methotrexate 20 mg PO weekly which was increased after last visit.  She feels symptoms are overall improving she has 0 pain on some days occasionally swelling and stiffness affecting the left arm.  Left shoulder range of motion remains slightly reduced but with much less pain.  She denies any new side effects on increased methotrexate dose.  She has regained about 5 pounds since having some unintentional weight loss last year denies any GI complaints reports normal appetite.   Previous HPI   12/11/20 Laurie Wallace is a 77 y.o. female here for evaluation of joint pains in multiple areas with elevated inflammatory markers.  She believes these symptoms in her shoulder  started since about 4 5 months ago without any specific preceding injury or event that she can recall.  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  She does not recall any past significant shoulder injuries or surgeries. Previous treatments tried include cymbalta that she does not notice a significant improvement.  Since last month she was started on oral daily prednisone for the past few weeks and has noticed a significant improvement in her joint symptoms particularly resolution of the hand swelling although does continue having some left  shoulder pain.   LAbs reviewed 10/2020 ANA neg ACE neg RF neg ANCAs neg ESR 68 CRP 25 Alk phos 126 Plts 495 CK 55   No Rheumatology ROS completed.   PMFS History:  Patient Active Problem List   Diagnosis Date Noted   Upper respiratory infection 10/28/2022   Localized swelling of right forearm 09/21/2022   Subcutaneous cyst 07/20/2022   Allergic sinusitis 01/14/2022   Encounter for general adult medical examination with abnormal findings 07/17/2021   Age-related osteoporosis without current pathological fracture 07/17/2021   Muscle cramps 02/28/2021   High risk medication use 12/26/2020   Seropositive rheumatoid arthritis ([facility]) 10/25/2020   Moderate protein-calorie malnutrition ([facility]) 10/25/2020   Cervical spine pain 10/23/2020   Lumbar pain 10/23/2020   Other intervertebral disc displacement, lumbar region 04/07/2010    Past Medical History:  Diagnosis Date   Allergy    Arthritis    COPD (chronic obstructive pulmonary disease) ([facility])     Family History  Problem Relation Age of Onset   Cancer Mother    Cancer Father    Heart failure Sister    COPD Sister    Dementia Sister    Past Surgical History:  Procedure Laterality Date   ABDOMINAL HYSTERECTOMY     TOE SURGERY Right    1st toe   Social History   Social History Narrative   Lives with her son   Right Handed   Drinks 1-2 cups caffeine daily in winter   Immunization History  Administered Date(s) Administered   PNEUMOCOCCAL CONJUGATE-20 02/28/2021     Objective: Vital Signs: There were no vitals taken for this visit.   Physical Exam   Musculoskeletal Exam: ***  CDAI Exam: CDAI Score: -- Patient Global: --; Provider Global: -- Swollen: --; Tender: -- Joint Exam 12/22/2022   No joint exam has been documented for this visit   There is currently no information documented on the homunculus. Go to the Rheumatology activity and complete the homunculus joint exam.  Investigation: No additional  findings.  Imaging: No results found.  Recent Labs: Lab Results  Component Value Date   WBC 9.4 09/21/2022   HGB 13.5 09/21/2022   PLT 443 (H) 09/21/2022   NA 141 09/21/2022   K 4.9 09/21/2022   CL 106 09/21/2022   CO2 29 09/21/2022   GLUCOSE 95 09/21/2022   BUN 9 09/21/2022   CREATININE 0.71 09/21/2022   BILITOT 0.3 09/21/2022   ALKPHOS 126 (H) 10/17/2020   AST 10 09/21/2022   ALT 6 09/21/2022   PROT 7.0 09/21/2022   ALBUMIN 3.9 10/17/2020   CALCIUM 9.2 09/21/2022   GFRAA 88 09/28/2014    Speciality Comments: No specialty comments available.  Procedures:  No procedures performed Allergies: Patient has no known allergies.   Assessment / Plan:     Visit Diagnoses: No diagnosis found.  ***  Orders: No  orders of the defined types were placed in this encounter.  No orders of the defined types were placed in this encounter.    Follow-Up Instructions: No follow-ups on file.   Collier Salina, MD  Note - This record has been created using [employer].  Chart creation errors have been sought, but may not always  have been located. Such creation errors do not reflect on  the standard of medical care.

## 2022-12-22 ENCOUNTER — Encounter: Payer: Self-pay | Admitting: Internal Medicine

## 2022-12-22 ENCOUNTER — Ambulatory Visit: Payer: Medicare HMO | Attending: Internal Medicine | Admitting: Internal Medicine

## 2022-12-22 VITALS — BP 116/75 | HR 70 | Resp 15 | Ht 66.0 in | Wt 124.0 lb

## 2022-12-22 DIAGNOSIS — G8929 Other chronic pain: Secondary | ICD-10-CM

## 2022-12-22 DIAGNOSIS — M25512 Pain in left shoulder: Secondary | ICD-10-CM

## 2022-12-22 DIAGNOSIS — M059 Rheumatoid arthritis with rheumatoid factor, unspecified: Secondary | ICD-10-CM | POA: Diagnosis not present

## 2022-12-22 DIAGNOSIS — M25511 Pain in right shoulder: Secondary | ICD-10-CM | POA: Diagnosis not present

## 2022-12-22 DIAGNOSIS — Z79899 Other long term (current) drug therapy: Secondary | ICD-10-CM | POA: Diagnosis not present

## 2022-12-22 MED ORDER — METHOTREXATE SODIUM 2.5 MG PO TABS: 20.0000 mg | ORAL_TABLET | ORAL | 0 refills | Status: DC

## 2022-12-22 MED ORDER — FOLIC ACID 1 MG PO TABS: 1.0000 mg | ORAL_TABLET | Freq: Every day | ORAL | 3 refills | Status: DC

## 2022-12-23 LAB — COMPLETE METABOLIC PANEL WITH GFR
AG Ratio: 1.2 (calc) (ref 1.0–2.5)
ALT: 9 U/L (ref 6–29)
AST: 10 U/L (ref 10–35)
Albumin: 3.7 g/dL (ref 3.6–5.1)
Alkaline phosphatase (APISO): 102 U/L (ref 37–153)
BUN: 8 mg/dL (ref 7–25)
CO2: 31 mmol/L (ref 20–32)
Calcium: 8.8 mg/dL (ref 8.6–10.4)
Chloride: 105 mmol/L (ref 98–110)
Creat: 0.73 mg/dL (ref 0.60–1.00)
Globulin: 3.1 g/dL (calc) (ref 1.9–3.7)
Glucose, Bld: 86 mg/dL (ref 65–99)
Potassium: 4.9 mmol/L (ref 3.5–5.3)
Sodium: 141 mmol/L (ref 135–146)
Total Bilirubin: 0.2 mg/dL (ref 0.2–1.2)
Total Protein: 6.8 g/dL (ref 6.1–8.1)
eGFR: 85 mL/min/{1.73_m2} (ref >=60)

## 2022-12-23 LAB — CBC WITH DIFFERENTIAL/PLATELET
Absolute Monocytes: 1109 cells/uL — ABNORMAL HIGH (ref 200–950)
Basophils Absolute: 45 cells/uL (ref 0–200)
Basophils Relative: 0.4 %
Eosinophils Absolute: 235 cells/uL (ref 15–500)
Eosinophils Relative: 2.1 %
HCT: 33.3 % — ABNORMAL LOW (ref 35.0–45.0)
Hemoglobin: 11.1 g/dL — ABNORMAL LOW (ref 11.7–15.5)
Lymphs Abs: 2184 cells/uL (ref 850–3900)
MCH: 30.1 pg (ref 27.0–33.0)
MCHC: 33.3 g/dL (ref 32.0–36.0)
MCV: 90.2 fL (ref 80.0–100.0)
MPV: 10.3 fL (ref 7.5–12.5)
Monocytes Relative: 9.9 %
Neutro Abs: 7627 cells/uL (ref 1500–7800)
Neutrophils Relative %: 68.1 %
Platelets: 307 10*3/uL (ref 140–400)
RBC: 3.69 10*6/uL — ABNORMAL LOW (ref 3.80–5.10)
RDW: 16.1 % — ABNORMAL HIGH (ref 11.0–15.0)
Total Lymphocyte: 19.5 %
WBC: 11.2 10*3/uL — ABNORMAL HIGH (ref 3.8–10.8)

## 2022-12-23 LAB — SEDIMENTATION RATE: Sed Rate: 62 mm/h — ABNORMAL HIGH (ref 0–30)

## 2022-12-23 NOTE — Progress Notes (Signed)
Sedimentation rate is increased to 62 indicating some increased inflammation. This has been a trend since last visit.  Her symptoms did not look too bad at our visit. If having more trouble before our next appointment, we could also add a low dose prednisone temporarily.

## 2023-01-09 ENCOUNTER — Inpatient Hospital Stay (HOSPITAL_COMMUNITY)
Admission: EM | Admit: 2023-01-09 | Discharge: 2023-01-14 | DRG: 872 | Disposition: A | Discharge status: Home / Self Care | Payer: Medicare HMO | Attending: Internal Medicine | Admitting: Internal Medicine

## 2023-01-09 ENCOUNTER — Emergency Department (HOSPITAL_COMMUNITY): Payer: Medicare HMO

## 2023-01-09 ENCOUNTER — Other Ambulatory Visit: Payer: Self-pay

## 2023-01-09 ENCOUNTER — Encounter (HOSPITAL_COMMUNITY): Payer: Self-pay

## 2023-01-09 DIAGNOSIS — R590 Localized enlarged lymph nodes: Secondary | ICD-10-CM | POA: Diagnosis not present

## 2023-01-09 DIAGNOSIS — R112 Nausea with vomiting, unspecified: Secondary | ICD-10-CM | POA: Insufficient documentation

## 2023-01-09 DIAGNOSIS — Z8249 Family history of ischemic heart disease and other diseases of the circulatory system: Secondary | ICD-10-CM | POA: Diagnosis not present

## 2023-01-09 DIAGNOSIS — Z90710 Acquired absence of both cervix and uterus: Secondary | ICD-10-CM | POA: Diagnosis not present

## 2023-01-09 DIAGNOSIS — K529 Noninfective gastroenteritis and colitis, unspecified: Secondary | ICD-10-CM | POA: Diagnosis not present

## 2023-01-09 DIAGNOSIS — E876 Hypokalemia: Secondary | ICD-10-CM | POA: Diagnosis not present

## 2023-01-09 DIAGNOSIS — M059 Rheumatoid arthritis with rheumatoid factor, unspecified: Secondary | ICD-10-CM | POA: Diagnosis present

## 2023-01-09 DIAGNOSIS — A09 Infectious gastroenteritis and colitis, unspecified: Secondary | ICD-10-CM | POA: Diagnosis not present

## 2023-01-09 DIAGNOSIS — E872 Acidosis, unspecified: Secondary | ICD-10-CM | POA: Diagnosis present

## 2023-01-09 DIAGNOSIS — Z791 Long term (current) use of non-steroidal anti-inflammatories (NSAID): Secondary | ICD-10-CM | POA: Diagnosis not present

## 2023-01-09 DIAGNOSIS — Z79899 Other long term (current) drug therapy: Secondary | ICD-10-CM | POA: Diagnosis not present

## 2023-01-09 DIAGNOSIS — D75839 Thrombocytosis, unspecified: Secondary | ICD-10-CM | POA: Insufficient documentation

## 2023-01-09 DIAGNOSIS — E871 Hypo-osmolality and hyponatremia: Secondary | ICD-10-CM | POA: Diagnosis not present

## 2023-01-09 DIAGNOSIS — R6520 Severe sepsis without septic shock: Secondary | ICD-10-CM | POA: Diagnosis present

## 2023-01-09 DIAGNOSIS — D75838 Other thrombocytosis: Secondary | ICD-10-CM | POA: Diagnosis not present

## 2023-01-09 DIAGNOSIS — K6389 Other specified diseases of intestine: Secondary | ICD-10-CM | POA: Diagnosis not present

## 2023-01-09 DIAGNOSIS — J449 Chronic obstructive pulmonary disease, unspecified: Secondary | ICD-10-CM | POA: Diagnosis not present

## 2023-01-09 DIAGNOSIS — K5900 Constipation, unspecified: Secondary | ICD-10-CM | POA: Diagnosis present

## 2023-01-09 DIAGNOSIS — Z7983 Long term (current) use of bisphosphonates: Secondary | ICD-10-CM

## 2023-01-09 DIAGNOSIS — Z825 Family history of asthma and other chronic lower respiratory diseases: Secondary | ICD-10-CM

## 2023-01-09 DIAGNOSIS — R739 Hyperglycemia, unspecified: Secondary | ICD-10-CM | POA: Diagnosis present

## 2023-01-09 DIAGNOSIS — Z79631 Long term (current) use of antimetabolite agent: Secondary | ICD-10-CM

## 2023-01-09 DIAGNOSIS — A419 Sepsis, unspecified organism: Principal | ICD-10-CM | POA: Diagnosis present

## 2023-01-09 DIAGNOSIS — R000 Tachycardia, unspecified: Secondary | ICD-10-CM | POA: Diagnosis not present

## 2023-01-09 DIAGNOSIS — R1013 Epigastric pain: Secondary | ICD-10-CM | POA: Diagnosis not present

## 2023-01-09 DIAGNOSIS — I700 Atherosclerosis of aorta: Secondary | ICD-10-CM | POA: Diagnosis not present

## 2023-01-09 DIAGNOSIS — E1165 Type 2 diabetes mellitus with hyperglycemia: Secondary | ICD-10-CM | POA: Diagnosis not present

## 2023-01-09 DIAGNOSIS — N281 Cyst of kidney, acquired: Secondary | ICD-10-CM | POA: Diagnosis not present

## 2023-01-09 DIAGNOSIS — Q272 Other congenital malformations of renal artery: Secondary | ICD-10-CM | POA: Diagnosis not present

## 2023-01-09 DIAGNOSIS — R1084 Generalized abdominal pain: Secondary | ICD-10-CM | POA: Diagnosis not present

## 2023-01-09 DIAGNOSIS — R81 Glycosuria: Secondary | ICD-10-CM | POA: Diagnosis present

## 2023-01-09 DIAGNOSIS — R109 Unspecified abdominal pain: Secondary | ICD-10-CM | POA: Insufficient documentation

## 2023-01-09 LAB — CBC WITH DIFFERENTIAL/PLATELET
Abs Immature Granulocytes: 0.34 10*3/uL — ABNORMAL HIGH (ref 0.00–0.07)
Basophils Absolute: 0.1 10*3/uL (ref 0.0–0.1)
Basophils Relative: 0 %
Eosinophils Absolute: 0 10*3/uL (ref 0.0–0.5)
Eosinophils Relative: 0 %
HCT: 31.9 % — ABNORMAL LOW (ref 36.0–46.0)
Hemoglobin: 10.7 g/dL — ABNORMAL LOW (ref 12.0–15.0)
Immature Granulocytes: 2 %
Lymphocytes Relative: 4 %
Lymphs Abs: 0.7 10*3/uL (ref 0.7–4.0)
MCH: 30.3 pg (ref 26.0–34.0)
MCHC: 33.5 g/dL (ref 30.0–36.0)
MCV: 90.4 fL (ref 80.0–100.0)
Monocytes Absolute: 0.6 10*3/uL (ref 0.1–1.0)
Monocytes Relative: 3 %
Neutro Abs: 18.8 10*3/uL — ABNORMAL HIGH (ref 1.7–7.7)
Neutrophils Relative %: 91 %
Platelets: 516 10*3/uL — ABNORMAL HIGH (ref 150–400)
RBC: 3.53 MIL/uL — ABNORMAL LOW (ref 3.87–5.11)
RDW: 16 % — ABNORMAL HIGH (ref 11.5–15.5)
WBC: 20.5 10*3/uL — ABNORMAL HIGH (ref 4.0–10.5)
nRBC: 0 % (ref 0.0–0.2)

## 2023-01-09 LAB — URINALYSIS, ROUTINE W REFLEX MICROSCOPIC
Bacteria, UA: NONE SEEN
Bilirubin Urine: NEGATIVE
Glucose, UA: >=500 mg/dL — AB
Ketones, ur: 5 mg/dL — AB
Nitrite: NEGATIVE
Protein, ur: 100 mg/dL — AB
Specific Gravity, Urine: 1.016 (ref 1.005–1.030)
pH: 6 (ref 5.0–8.0)

## 2023-01-09 LAB — COMPREHENSIVE METABOLIC PANEL
ALT: 9 U/L (ref 0–44)
AST: 19 U/L (ref 15–41)
Albumin: 3.6 g/dL (ref 3.5–5.0)
Alkaline Phosphatase: 94 U/L (ref 38–126)
Anion gap: 13 (ref 5–15)
BUN: 12 mg/dL (ref 8–23)
CO2: 20 mmol/L — ABNORMAL LOW (ref 22–32)
Calcium: 8.6 mg/dL — ABNORMAL LOW (ref 8.9–10.3)
Chloride: 101 mmol/L (ref 98–111)
Creatinine, Ser: 0.91 mg/dL (ref 0.44–1.00)
GFR, Estimated: >60 mL/min (ref >60)
Glucose, Bld: 300 mg/dL — ABNORMAL HIGH (ref 70–99)
Potassium: 2.8 mmol/L — ABNORMAL LOW (ref 3.5–5.1)
Sodium: 134 mmol/L — ABNORMAL LOW (ref 135–145)
Total Bilirubin: 0.4 mg/dL (ref 0.3–1.2)
Total Protein: 7.9 g/dL (ref 6.5–8.1)

## 2023-01-09 LAB — MAGNESIUM: Magnesium: 1.8 mg/dL (ref 1.7–2.4)

## 2023-01-09 LAB — LIPASE, BLOOD: Lipase: 25 U/L (ref 11–51)

## 2023-01-09 MED ORDER — ONDANSETRON HCL 4 MG/2ML IJ SOLN
4.0000 mg | Freq: Once | INTRAMUSCULAR | Status: AC
  Administered 2023-01-09: 4 mg via INTRAVENOUS
  Filled 2023-01-09: qty 2

## 2023-01-09 MED ORDER — IOHEXOL 300 MG/ML  SOLN
100.0000 mL | Freq: Once | INTRAMUSCULAR | Status: AC | PRN
  Administered 2023-01-09: 80 mL via INTRAVENOUS

## 2023-01-09 MED ORDER — SODIUM CHLORIDE 0.9 % IV BOLUS
500.0000 mL | Freq: Once | INTRAVENOUS | Status: AC
  Administered 2023-01-09: 500 mL via INTRAVENOUS

## 2023-01-09 MED ORDER — FENTANYL CITRATE PF 50 MCG/ML IJ SOSY
50.0000 ug | PREFILLED_SYRINGE | Freq: Once | INTRAMUSCULAR | Status: AC
  Administered 2023-01-09: 50 ug via INTRAVENOUS
  Filled 2023-01-09: qty 1

## 2023-01-09 MED ORDER — POTASSIUM CHLORIDE 10 MEQ/100ML IV SOLN
10.0000 meq | INTRAVENOUS | Status: AC
  Administered 2023-01-09 – 2023-01-10 (×3): 10 meq via INTRAVENOUS
  Filled 2023-01-09 (×3): qty 100

## 2023-01-09 MED ORDER — FAMOTIDINE IN NACL 20-0.9 MG/50ML-% IV SOLN
20.0000 mg | Freq: Once | INTRAVENOUS | Status: AC
  Administered 2023-01-09: 20 mg via INTRAVENOUS
  Filled 2023-01-09: qty 50

## 2023-01-09 MED ORDER — IOHEXOL 350 MG/ML SOLN
100.0000 mL | Freq: Once | INTRAVENOUS | Status: AC | PRN
  Administered 2023-01-09: 75 mL via INTRAVENOUS

## 2023-01-09 NOTE — ED Provider Notes (Signed)
[place] EMERGENCY DEPARTMENT AT [facility] Provider Note   CSN: [id number] Arrival date & time: 01/09/23  1935     History  Chief Complaint  Patient presents with   Emesis    Laurie Wallace is a 77 y.o. female. He has past medical history of rheumatoid arthritis, on methotrexate.  Presents the ER for epigastric abdominal pain that started suddenly today.  She states she was eating some watermelon and began to have immediate pain that she felt like it was gas. Pain nausea as well, no fever or chills, no chest pain or shortness of breath.  Emesis      Home Medications Prior to Admission medications   Medication Sig Start Date End Date Taking? Authorizing Provider  alendronate (FOSAMAX) 70 MG tablet Take 1 tablet (70 mg total) by mouth every 7 (seven) days. Take with a full glass of water on an empty stomach. 01/14/22   Anabel Halon, MD  benzonatate (TESSALON) 100 MG capsule Take 1 capsule (100 mg total) by mouth 2 (two) times daily as needed for cough. 10/28/22   Del Nigel Berthold, FNP  Calcium Carbonate-Vit D-Min (CALTRATE 600+D PLUS MINERALS) 600-800 MG-UNIT CHEW Chew 1 tablet by mouth daily. 10/29/20   Anabel Halon, MD  cyclobenzaprine (FLEXERIL) 5 MG tablet Take 1 tablet (5 mg total) by mouth 3 (three) times daily as needed for muscle spasms. 02/28/21   Anabel Halon, MD  folic acid (FOLVITE) 1 MG tablet Take 1 tablet (1 mg total) by mouth daily. 12/22/22   Rice, Jamesetta Orleans, MD  levocetirizine (XYZAL) 5 MG tablet Take 1 tablet (5 mg total) by mouth every evening. 01/14/22   Anabel Halon, MD  meloxicam (MOBIC) 7.5 MG tablet Take 1 tablet (7.5 mg total) by mouth daily. 10/21/22   Anabel Halon, MD  methotrexate (RHEUMATREX) 2.5 MG tablet Take 8 tablets (20 mg total) by mouth once a week. Caution:Chemotherapy. Protect from light. 12/22/22   Rice, Jamesetta Orleans, MD  brompheniramine (VAZOL) 2 MG/5ML LIQD Take 2 mg by mouth 2 (two) times daily.   07/23/20  [provider]  fluticasone (FLONASE) 50 MCG/ACT nasal spray Place 2 sprays into the nose daily.  07/23/20  [provider]      Allergies    Patient has no known allergies.    Review of Systems   Review of Systems  Gastrointestinal:  Positive for vomiting.    Physical Exam Updated Vital Signs BP 134/87 (BP Location: Right Arm)   Pulse (!) 104   Temp 98 F (36.7 C) (Oral)   Resp 17   Ht 5\' 6"  (1.676 m)   Wt 56 kg   SpO2 100%   BMI 19.93 kg/m  Physical Exam Vitals and nursing note reviewed.  Constitutional:      Appearance: She is well-developed.     Comments: Patient uncomfortable, holding her abdomen  HENT:     Head: Normocephalic and atraumatic.     Mouth/Throat:     Mouth: Mucous membranes are moist.  Eyes:     Conjunctiva/sclera: Conjunctivae normal.  Cardiovascular:     Rate and Rhythm: Normal rate and regular rhythm.     Heart sounds: No murmur heard. Pulmonary:     Effort: Pulmonary effort is normal. No respiratory distress.     Breath sounds: Normal breath sounds.  Abdominal:     Palpations: Abdomen is soft.     Tenderness: There is abdominal tenderness.  Musculoskeletal:  General: No swelling.     Cervical back: Neck supple.  Skin:    General: Skin is warm and dry.     Capillary Refill: Capillary refill takes less than 2 seconds.  Neurological:     General: No focal deficit present.     Mental Status: She is alert and oriented to person, place, and time.  Psychiatric:        Mood and Affect: Mood normal.     ED Results / Procedures / Treatments   Labs (all labs ordered are listed, but only abnormal results are displayed) Labs Reviewed  URINALYSIS, ROUTINE W REFLEX MICROSCOPIC - Abnormal; Notable for the following components:      Result Value   Glucose, UA >=500 (*)    Hgb urine dipstick SMALL (*)    Ketones, ur 5 (*)    Protein, ur 100 (*)    Leukocytes,Ua TRACE (*)    All other components within normal  limits  COMPREHENSIVE METABOLIC PANEL - Abnormal; Notable for the following components:   Sodium 134 (*)    Potassium 2.8 (*)    CO2 20 (*)    Glucose, Bld 300 (*)    Calcium 8.6 (*)    All other components within normal limits  CBC WITH DIFFERENTIAL/PLATELET - Abnormal; Notable for the following components:   WBC 20.5 (*)    RBC 3.53 (*)    Hemoglobin 10.7 (*)    HCT 31.9 (*)    RDW 16.0 (*)    Platelets 516 (*)    Neutro Abs 18.8 (*)    Abs Immature Granulocytes 0.34 (*)    All other components within normal limits  LIPASE, BLOOD  MAGNESIUM    EKG EKG Interpretation Date/Time:  Saturday January 09 2023 21:38:00 EDT Ventricular Rate:  103 PR Interval:  195 QRS Duration:  94 QT Interval:  356 QTC Calculation: 469 R Axis:   64  Text Interpretation: Sinus tachycardia Ventricular premature complex Baseline wander in lead(s) V1 V4 Confirmed by Eber Hong ([id number]) on 01/09/2023 10:10:50 PM  Radiology CT ABDOMEN PELVIS W CONTRAST  Result Date: 01/09/2023 CLINICAL DATA:  Abdominal pain and vomiting. EXAM: CT ABDOMEN AND PELVIS WITH CONTRAST TECHNIQUE: Multidetector CT imaging of the abdomen and pelvis was performed using the standard protocol following bolus administration of intravenous contrast. RADIATION DOSE REDUCTION: This exam was performed according to the departmental dose-optimization program which includes automated exposure control, adjustment of the mA and/or kV according to patient size and/or use of iterative reconstruction technique. CONTRAST:  80mL OMNIPAQUE IOHEXOL 300 MG/ML  SOLN COMPARISON:  None Available. FINDINGS: Lower chest: No acute abnormality. Hepatobiliary: A punctate focus of parenchymal low attenuation is seen within the posterolateral aspect of the liver dome. No gallstones, gallbladder wall thickening, or biliary dilatation. Pancreas: Unremarkable. No pancreatic ductal dilatation or surrounding inflammatory changes. Spleen: Normal in size without focal  abnormality. Adrenals/Urinary Tract: There is mild, diffuse, bilateral adrenal gland enlargement. Kidneys are normal in size, without renal calculi or hydronephrosis. A subcentimeter simple cyst is seen within the anterior aspect of the mid left kidney. Bladder is unremarkable. Stomach/Bowel: Stomach is within normal limits. Appendix appears normal. No evidence of bowel dilatation. The splenic flexure is mildly thickened and poorly distended. Vascular/Lymphatic: No significant vascular findings are present. No enlarged abdominal or pelvic lymph nodes. Reproductive: Status post hysterectomy. No adnexal masses. Other: No abdominal wall hernia or abnormality. No abdominopelvic ascites. Musculoskeletal: Marked severity degenerative changes seen at the level of L5-S1. IMPRESSION: 1.  No acute or active process within the abdomen or pelvis. 2. Subcentimeter simple left renal cyst. No follow-up imaging is recommended. This recommendation follows ACR consensus guidelines: Management of the Incidental Renal Mass on CT: A White Paper of the ACR Incidental Findings Committee. J Am Coll Radiol 2018;15:264-273. 3. Mild thickening of the splenic flexure which may be secondary to poor bowel distention. Mild colitis cannot be excluded. 4. Evidence of prior hysterectomy. 5. Marked severity degenerative changes at the level of L5-S1. Electronically Signed   By: Aram Candela M.D.   On: 01/09/2023 22:23    Procedures Procedures    Medications Ordered in ED Medications  potassium chloride 10 mEq in 100 mL IVPB (10 mEq Intravenous New Bag/Given 01/09/23 2324)  ondansetron (ZOFRAN) injection 4 mg (4 mg Intravenous Given 01/09/23 2142)  fentaNYL (SUBLIMAZE) injection 50 mcg (50 mcg Intravenous Given 01/09/23 2143)  iohexol (OMNIPAQUE) 300 MG/ML solution 100 mL (80 mLs Intravenous Contrast Given 01/09/23 2157)  sodium chloride 0.9 % bolus 500 mL (500 mLs Intravenous New Bag/Given 01/09/23 2252)    ED Course/ Medical Decision  Making/ A&P                             Medical Decision Making DDx: Appendicitis, cholecystitis, cholelithiasis, pancreatitis, AAA, tach ischemia, gastroenteritis, gastritis, peptic ulcer disease, perforated ulcer, other  ED course: Patient presents the ER for abdominal pain started suddenly today.  No history of the same.  Significant for white blood cell count of 20.  She is not sure if she has not been on steroids.  I do not see recent steroid prescription in her chart.  Also significant for hyperglycemia at 300, she does not have signs of diabetes, potassium noted to be 2.8.  Patient noted to be in a lot of discomfort on exam, was initially refusing IV and wanting oral nausea medicines and felt she only had gas.  Discussed with patient her lab abnormalities and my concern for an acute intra-abdominal process given her discomfort.  She was ultimately willing to undergo further workup.  CT abdomen pelvis was reassuring showed possible colitis versus underdistention of the colon and patient has no diarrhea but patient still having some discomfort, still mildly tachycardic, given her pain was postprandial and pain was out of proportion to exam concern for possible mesenteric ischemia so CTA abdomen pelvis ordered.  Patient was maintained on cardiac monitor showed sinus tachycardia  Amount and/or Complexity of Data Reviewed External Data Reviewed: labs and notes. Labs: ordered. Decision-making details documented in ED Course. Radiology: ordered.  Risk Prescription drug management. Parenteral controlled substances.   Signed out to Dr. Clayborne Dana pending CT and lactic acid.         Final Clinical Impression(s) / ED Diagnoses Final diagnoses:  Hypokalemia  Hyperglycemia    Rx / DC Orders ED Discharge Orders     None         Josem Kaufmann 01/09/23 2328    Mesner, Barbara Cower, MD 01/10/23 0001

## 2023-01-09 NOTE — ED Notes (Signed)
Pt reminded of need for urine sample. Pt stated she did not feel the urge to void at this time. RN requested that pt ring call bell when she feels she can provide urine sample. Pt verbalized understanding.

## 2023-01-09 NOTE — ED Triage Notes (Signed)
Pt arrived from home, c/o abdominal pain, and emesis that began earlier this afternoon around 1330. Pt presents with full body tremors she reports is related to pain.

## 2023-01-09 NOTE — Discharge Instructions (Signed)
You are seen today for abdominal pain, we noted that you had low potassium and this was repleted.  Make sure you follow-up close with your primary care doctor, you are given prescription for potassium for next several days to take to help increase your potassium.  We also noted that your blood sugar was high.  It was 300.  You to follow-up closely with your primary care doctor for further evaluation for possible diabetes.

## 2023-01-09 NOTE — ED Notes (Signed)
To pt CT at this time

## 2023-01-10 DIAGNOSIS — R739 Hyperglycemia, unspecified: Secondary | ICD-10-CM | POA: Insufficient documentation

## 2023-01-10 DIAGNOSIS — Z79631 Long term (current) use of antimetabolite agent: Secondary | ICD-10-CM | POA: Diagnosis not present

## 2023-01-10 DIAGNOSIS — A09 Infectious gastroenteritis and colitis, unspecified: Secondary | ICD-10-CM | POA: Diagnosis present

## 2023-01-10 DIAGNOSIS — R112 Nausea with vomiting, unspecified: Secondary | ICD-10-CM | POA: Diagnosis not present

## 2023-01-10 DIAGNOSIS — K5900 Constipation, unspecified: Secondary | ICD-10-CM | POA: Diagnosis present

## 2023-01-10 DIAGNOSIS — E876 Hypokalemia: Principal | ICD-10-CM | POA: Insufficient documentation

## 2023-01-10 DIAGNOSIS — Z79899 Other long term (current) drug therapy: Secondary | ICD-10-CM | POA: Diagnosis not present

## 2023-01-10 DIAGNOSIS — J449 Chronic obstructive pulmonary disease, unspecified: Secondary | ICD-10-CM | POA: Diagnosis present

## 2023-01-10 DIAGNOSIS — Z8249 Family history of ischemic heart disease and other diseases of the circulatory system: Secondary | ICD-10-CM | POA: Diagnosis not present

## 2023-01-10 DIAGNOSIS — Z825 Family history of asthma and other chronic lower respiratory diseases: Secondary | ICD-10-CM | POA: Diagnosis not present

## 2023-01-10 DIAGNOSIS — D75839 Thrombocytosis, unspecified: Secondary | ICD-10-CM | POA: Diagnosis not present

## 2023-01-10 DIAGNOSIS — R6520 Severe sepsis without septic shock: Secondary | ICD-10-CM | POA: Diagnosis present

## 2023-01-10 DIAGNOSIS — E872 Acidosis, unspecified: Secondary | ICD-10-CM | POA: Diagnosis present

## 2023-01-10 DIAGNOSIS — R109 Unspecified abdominal pain: Secondary | ICD-10-CM | POA: Insufficient documentation

## 2023-01-10 DIAGNOSIS — E871 Hypo-osmolality and hyponatremia: Secondary | ICD-10-CM | POA: Diagnosis not present

## 2023-01-10 DIAGNOSIS — Z7983 Long term (current) use of bisphosphonates: Secondary | ICD-10-CM | POA: Diagnosis not present

## 2023-01-10 DIAGNOSIS — A419 Sepsis, unspecified organism: Secondary | ICD-10-CM | POA: Insufficient documentation

## 2023-01-10 DIAGNOSIS — D75838 Other thrombocytosis: Secondary | ICD-10-CM | POA: Diagnosis present

## 2023-01-10 DIAGNOSIS — R81 Glycosuria: Secondary | ICD-10-CM | POA: Diagnosis present

## 2023-01-10 DIAGNOSIS — M059 Rheumatoid arthritis with rheumatoid factor, unspecified: Secondary | ICD-10-CM | POA: Diagnosis present

## 2023-01-10 DIAGNOSIS — Z90710 Acquired absence of both cervix and uterus: Secondary | ICD-10-CM | POA: Diagnosis not present

## 2023-01-10 DIAGNOSIS — R1084 Generalized abdominal pain: Secondary | ICD-10-CM | POA: Diagnosis not present

## 2023-01-10 DIAGNOSIS — Z791 Long term (current) use of non-steroidal anti-inflammatories (NSAID): Secondary | ICD-10-CM | POA: Diagnosis not present

## 2023-01-10 LAB — COMPREHENSIVE METABOLIC PANEL
ALT: 7 U/L (ref 0–44)
AST: 14 U/L — ABNORMAL LOW (ref 15–41)
Albumin: 3 g/dL — ABNORMAL LOW (ref 3.5–5.0)
Alkaline Phosphatase: 81 U/L (ref 38–126)
Anion gap: 10 (ref 5–15)
BUN: 8 mg/dL (ref 8–23)
CO2: 22 mmol/L (ref 22–32)
Calcium: 8.2 mg/dL — ABNORMAL LOW (ref 8.9–10.3)
Chloride: 100 mmol/L (ref 98–111)
Creatinine, Ser: 0.63 mg/dL (ref 0.44–1.00)
GFR, Estimated: >60 mL/min (ref >60)
Glucose, Bld: 164 mg/dL — ABNORMAL HIGH (ref 70–99)
Potassium: 3.8 mmol/L (ref 3.5–5.1)
Sodium: 132 mmol/L — ABNORMAL LOW (ref 135–145)
Total Bilirubin: 0.3 mg/dL (ref 0.3–1.2)
Total Protein: 6.9 g/dL (ref 6.5–8.1)

## 2023-01-10 LAB — CBC
HCT: 30.6 % — ABNORMAL LOW (ref 36.0–46.0)
Hemoglobin: 10.5 g/dL — ABNORMAL LOW (ref 12.0–15.0)
MCH: 30.7 pg (ref 26.0–34.0)
MCHC: 34.3 g/dL (ref 30.0–36.0)
MCV: 89.5 fL (ref 80.0–100.0)
Platelets: 407 10*3/uL — ABNORMAL HIGH (ref 150–400)
RBC: 3.42 MIL/uL — ABNORMAL LOW (ref 3.87–5.11)
RDW: 16.3 % — ABNORMAL HIGH (ref 11.5–15.5)
WBC: 18 10*3/uL — ABNORMAL HIGH (ref 4.0–10.5)
nRBC: 0 % (ref 0.0–0.2)

## 2023-01-10 LAB — PHOSPHORUS: Phosphorus: 2.1 mg/dL — ABNORMAL LOW (ref 2.5–4.6)

## 2023-01-10 LAB — LACTIC ACID, PLASMA
Lactic Acid, Venous: 1 mmol/L (ref 0.5–1.9)
Lactic Acid, Venous: 1.8 mmol/L (ref 0.5–1.9)
Lactic Acid, Venous: 4.3 mmol/L (ref 0.5–1.9)
Lactic Acid, Venous: 5.1 mmol/L (ref 0.5–1.9)
Lactic Acid, Venous: 5.9 mmol/L (ref 0.5–1.9)

## 2023-01-10 LAB — PROCALCITONIN: Procalcitonin: <0.1 ng/mL

## 2023-01-10 MED ORDER — CIPROFLOXACIN IN D5W 400 MG/200ML IV SOLN
400.0000 mg | Freq: Once | INTRAVENOUS | Status: AC
  Administered 2023-01-10: 400 mg via INTRAVENOUS
  Filled 2023-01-10: qty 200

## 2023-01-10 MED ORDER — HYDROMORPHONE HCL 1 MG/ML IJ SOLN
0.2500 mg | INTRAMUSCULAR | Status: DC | PRN
  Administered 2023-01-10 – 2023-01-11 (×3): 0.5 mg via INTRAVENOUS
  Filled 2023-01-10 (×3): qty 0.5

## 2023-01-10 MED ORDER — LORATADINE 10 MG PO TABS
10.0000 mg | ORAL_TABLET | Freq: Every day | ORAL | Status: DC
  Administered 2023-01-10 – 2023-01-13 (×4): 10 mg via ORAL
  Filled 2023-01-10 (×4): qty 1

## 2023-01-10 MED ORDER — PANTOPRAZOLE SODIUM 40 MG PO TBEC
40.0000 mg | DELAYED_RELEASE_TABLET | Freq: Every evening | ORAL | Status: DC
  Administered 2023-01-10 – 2023-01-13 (×4): 40 mg via ORAL
  Filled 2023-01-10 (×3): qty 1

## 2023-01-10 MED ORDER — METRONIDAZOLE 500 MG/100ML IV SOLN
500.0000 mg | Freq: Once | INTRAVENOUS | Status: AC
  Administered 2023-01-10: 500 mg via INTRAVENOUS
  Filled 2023-01-10: qty 100

## 2023-01-10 MED ORDER — HYDROMORPHONE HCL 1 MG/ML IJ SOLN
0.5000 mg | Freq: Once | INTRAMUSCULAR | Status: AC
  Administered 2023-01-10: 0.5 mg via INTRAVENOUS
  Filled 2023-01-10: qty 0.5

## 2023-01-10 MED ORDER — SODIUM CHLORIDE 0.9 % IV SOLN: INTRAVENOUS | Status: DC

## 2023-01-10 MED ORDER — ACETAMINOPHEN 325 MG PO TABS
650.0000 mg | ORAL_TABLET | Freq: Four times a day (QID) | ORAL | Status: DC | PRN
  Administered 2023-01-13 (×2): 650 mg via ORAL
  Filled 2023-01-10 (×2): qty 2

## 2023-01-10 MED ORDER — FOLIC ACID 1 MG PO TABS
1.0000 mg | ORAL_TABLET | Freq: Every day | ORAL | Status: DC
  Administered 2023-01-10 – 2023-01-14 (×5): 1 mg via ORAL
  Filled 2023-01-10 (×5): qty 1

## 2023-01-10 MED ORDER — METRONIDAZOLE 500 MG/100ML IV SOLN
500.0000 mg | Freq: Two times a day (BID) | INTRAVENOUS | Status: DC
  Administered 2023-01-10 – 2023-01-14 (×7): 500 mg via INTRAVENOUS
  Filled 2023-01-10 (×8): qty 100

## 2023-01-10 MED ORDER — PROCHLORPERAZINE EDISYLATE 10 MG/2ML IJ SOLN
10.0000 mg | INTRAMUSCULAR | Status: DC | PRN
  Administered 2023-01-10: 10 mg via INTRAVENOUS
  Filled 2023-01-10: qty 2

## 2023-01-10 MED ORDER — ENOXAPARIN SODIUM 40 MG/0.4ML IJ SOSY
40.0000 mg | PREFILLED_SYRINGE | INTRAMUSCULAR | Status: DC
  Administered 2023-01-10 – 2023-01-13 (×2): 40 mg via SUBCUTANEOUS
  Filled 2023-01-10 (×5): qty 0.4

## 2023-01-10 MED ORDER — ONDANSETRON HCL 4 MG PO TABS: 4.0000 mg | ORAL_TABLET | Freq: Four times a day (QID) | ORAL | Status: DC | PRN

## 2023-01-10 MED ORDER — CIPROFLOXACIN IN D5W 400 MG/200ML IV SOLN
400.0000 mg | Freq: Two times a day (BID) | INTRAVENOUS | Status: DC
  Administered 2023-01-10 – 2023-01-13 (×7): 400 mg via INTRAVENOUS
  Filled 2023-01-10 (×8): qty 200

## 2023-01-10 MED ORDER — LEVOCETIRIZINE DIHYDROCHLORIDE 5 MG PO TABS: 5.0000 mg | ORAL_TABLET | Freq: Every evening | ORAL | Status: DC

## 2023-01-10 MED ORDER — BOOST / RESOURCE BREEZE PO LIQD CUSTOM
1.0000 | Freq: Three times a day (TID) | ORAL | Status: DC
  Administered 2023-01-11 – 2023-01-14 (×3): 1 via ORAL

## 2023-01-10 MED ORDER — LACTATED RINGERS IV BOLUS
1000.0000 mL | Freq: Once | INTRAVENOUS | Status: AC
  Administered 2023-01-10: 1000 mL via INTRAVENOUS

## 2023-01-10 MED ORDER — ACETAMINOPHEN 650 MG RE SUPP: 650.0000 mg | Freq: Four times a day (QID) | RECTAL | Status: DC | PRN

## 2023-01-10 MED ORDER — ONDANSETRON HCL 4 MG/2ML IJ SOLN
4.0000 mg | Freq: Four times a day (QID) | INTRAMUSCULAR | Status: DC | PRN
  Administered 2023-01-10 – 2023-01-13 (×4): 4 mg via INTRAVENOUS
  Filled 2023-01-10 (×4): qty 2

## 2023-01-10 MED ORDER — MAGNESIUM SULFATE 2 GM/50ML IV SOLN
2.0000 g | Freq: Once | INTRAVENOUS | Status: AC
  Administered 2023-01-10: 2 g via INTRAVENOUS
  Filled 2023-01-10: qty 50

## 2023-01-10 NOTE — Progress Notes (Signed)
Pharmacy Antibiotic Note  Laurie Wallace is a 77 y.o. female admitted on 01/09/2023 with infectious colitis.  Pharmacy has been consulted for ciprofloxacin dosing.  Plan: Ciprofloxacin 400mg  IV Q12H. Also started on metronidazole appropriately per admitting DO.  Height: 5\' 6"  (167.6 cm) Weight: 56 kg (123 lb 7.3 oz) IBW/kg (Calculated) : 59.3  Temp (24hrs), Avg:98 F (36.7 C), Min:98 F (36.7 C), Max:98.1 F (36.7 C)  Recent Labs  Lab 01/09/23 2021 01/09/23 2355 01/10/23 0149 01/10/23 0430  WBC 20.5*  --   --   --   CREATININE 0.91  --   --   --   LATICACIDVEN  --  5.1* 5.9* 4.3*    Estimated Creatinine Clearance: 45.8 mL/min (by C-G formula based on SCr of 0.91 mg/dL).    No Known Allergies  Thank you for allowing pharmacy to be a part of this patient's care.  Vernard Gambles, PharmD, BCPS  01/10/2023 5:53 AM

## 2023-01-10 NOTE — Progress Notes (Signed)
Pt arrived to room 305 via stretcher from ED. Pt ambulatory from stretcher to bed with standby assist. Does c/o dizziness with sitting up or standing. Pt oriented to room and safety procedures. Bed alarm on for safety. Advised to call for needs, states understanding.

## 2023-01-10 NOTE — ED Provider Notes (Signed)
I provided a substantive portion of the care of this patient.  I personally made/approved the management plan for this patient and take responsibility for the patient management.   Here with pain out of proportion to exam after eating a meal. Pain improved quite a bit but CT w/ contrast concerning for colitis. 2/2 leukocytosis and level of pain, getting CTA to r/o ischemic causes for colitis.   EKG Interpretation Date/Time:  Saturday January 09 2023 21:38:00 EDT Ventricular Rate:  103 PR Interval:  195 QRS Duration:  94 QT Interval:  356 QTC Calculation: 469 R Axis:   64  Text Interpretation: Sinus tachycardia Ventricular premature complex Baseline wander in lead(s) V1 V4 Confirmed by Eber Hong ([id number]) on 01/09/2023 10:10:50 PM

## 2023-01-10 NOTE — H&P (Signed)
History and Physical    Patient: Laurie Wallace ZOX:[id number] DOB: September 02, 1945 DOA: 01/09/2023 DOS: the patient was seen and examined on 01/10/2023 PCP: Anabel Halon, MD  Patient coming from: Home  Chief Complaint:  Chief Complaint  Patient presents with   Emesis   HPI: Laurie Wallace is a 77 y.o. female with medical history significant of rheumatoid arthritis to the emergency department accompanied by son due to pain which started this afternoon around 1:30 PM after eating watermelon abdominal pain was epigastric, pain was severe and was associated with nausea and nonbloody vomiting.  She denies chest pain, shortness of breath, fever, chills.  ED Course:  In the emergency department, she was intermittently tachypneic and tachycardic, but other vital signs were within normal range.  Workup in the ED showed WBC 20.5 with left shift, hemoglobin 10.7, hematocrit 31.9, MCV 90.4, platelets 516.  BMP showed sodium 134, potassium 2.8, bicarb 20, blood glucose 300.  Lipase level 25, magnesium 1.8, urinalysis was positive for glycosuria, lactic acid 5.1 > 5.9 > 4.3.  Blood culture pending CT angiography of abdomen and pelvis without and with contrast showed no evidence of aortic aneurysm or dissection as enteric vessels widely patent.  No significant vascular abnormality to suggest mesenteric ischemia. Mild apparent colonic wall thickening at the splenic flexure and proximal descending colon which may reflect mild infectious or inflammatory colitis. CT abdomen and pelvis with contrast showed no acute or active process within the abdomen or pelvis.  Mild thickening of the splenic flexure which may be secondary to poor bowel distention. Mild colitis cannot be excluded. Patient was treated with IV ciprofloxacin and metronidazole, pain medication was given, potassium was replenished, Zofran was also given.  Hospitalist was asked to admit patient for further evaluation and  management.   Review of Systems: Review of systems as noted in the HPI. All other systems reviewed and are negative.   Past Medical History:  Diagnosis Date   Allergy    Arthritis    COPD (chronic obstructive pulmonary disease) (HCC)    Past Surgical History:  Procedure Laterality Date   ABDOMINAL HYSTERECTOMY     TOE SURGERY Right    1st toe    Social History:  reports that she has never smoked. She has never been exposed to tobacco smoke. She has never used smokeless tobacco. She reports that she does not drink alcohol and does not use drugs.   No Known Allergies  Family History  Problem Relation Age of Onset   Cancer Mother    Cancer Father    Heart failure Sister    COPD Sister    Dementia Sister      Prior to Admission medications   Medication Sig Start Date End Date Taking? Authorizing Provider  alendronate (FOSAMAX) 70 MG tablet Take 1 tablet (70 mg total) by mouth every 7 (seven) days. Take with a full glass of water on an empty stomach. 01/14/22   Anabel Halon, MD  benzonatate (TESSALON) 100 MG capsule Take 1 capsule (100 mg total) by mouth 2 (two) times daily as needed for cough. 10/28/22   Del Nigel Berthold, FNP  Calcium Carbonate-Vit D-Min (CALTRATE 600+D PLUS MINERALS) 600-800 MG-UNIT CHEW Chew 1 tablet by mouth daily. 10/29/20   Anabel Halon, MD  cyclobenzaprine (FLEXERIL) 5 MG tablet Take 1 tablet (5 mg total) by mouth 3 (three) times daily as needed for muscle spasms. 02/28/21   Anabel Halon, MD  folic acid (FOLVITE) 1  MG tablet Take 1 tablet (1 mg total) by mouth daily. 12/22/22   Rice, Jamesetta Orleans, MD  levocetirizine (XYZAL) 5 MG tablet Take 1 tablet (5 mg total) by mouth every evening. 01/14/22   Anabel Halon, MD  meloxicam (MOBIC) 7.5 MG tablet Take 1 tablet (7.5 mg total) by mouth daily. 10/21/22   Anabel Halon, MD  methotrexate (RHEUMATREX) 2.5 MG tablet Take 8 tablets (20 mg total) by mouth once a week. Caution:Chemotherapy. Protect  from light. 12/22/22   Rice, Jamesetta Orleans, MD  brompheniramine (VAZOL) 2 MG/5ML LIQD Take 2 mg by mouth 2 (two) times daily.  07/23/20  [provider]  fluticasone (FLONASE) 50 MCG/ACT nasal spray Place 2 sprays into the nose daily.  07/23/20  [provider]    Physical Exam: BP (!) 164/76   Pulse (!) 103   Temp 98 F (36.7 C) (Rectal)   Resp 20   Ht 5\' 6"  (1.676 m)   Wt 56 kg   SpO2 94%   BMI 19.93 kg/m   General: 77 y.o. year-old female well developed well nourished in no acute distress.  Alert and oriented x3. HEENT: NCAT, EOMI Neck: Supple, trachea medial Cardiovascular: Regular rate and rhythm with no rubs or gallops.  No thyromegaly or JVD noted.  No lower extremity edema. 2/4 pulses in all 4 extremities. Respiratory: Clear to auscultation with no wheezes or rales. Good inspiratory effort. Abdomen: Soft, nontender nondistended with normal bowel sounds x4 quadrants. Muskuloskeletal: No cyanosis, clubbing or edema noted bilaterally Neuro: CN II-XII intact, strength 5/5 x 4, sensation, reflexes intact Skin: No ulcerative lesions noted or rashes Psychiatry: Judgement and insight appear normal. Mood is appropriate for condition and setting          Labs on Admission:  Basic Metabolic Panel: Recent Labs  Lab 01/09/23 2021  NA 134*  K 2.8*  CL 101  CO2 20*  GLUCOSE 300*  BUN 12  CREATININE 0.91  CALCIUM 8.6*  MG 1.8   Liver Function Tests: Recent Labs  Lab 01/09/23 2021  AST 19  ALT 9  ALKPHOS 94  BILITOT 0.4  PROT 7.9  ALBUMIN 3.6   Recent Labs  Lab 01/09/23 2021  LIPASE 25   No results for input(s): "AMMONIA" in the last 168 hours. CBC: Recent Labs  Lab 01/09/23 2021  WBC 20.5*  NEUTROABS 18.8*  HGB 10.7*  HCT 31.9*  MCV 90.4  PLT 516*   Cardiac Enzymes: No results for input(s): "CKTOTAL", "CKMB", "CKMBINDEX", "TROPONINI" in the last 168 hours.  BNP (last 3 results) No results for input(s): "BNP" in the last 8760  hours.  ProBNP (last 3 results) No results for input(s): "PROBNP" in the last 8760 hours.  CBG: No results for input(s): "GLUCAP" in the last 168 hours.  Radiological Exams on Admission: CT Angio Abd/Pel W and/or Wo Contrast  Result Date: 01/10/2023 CLINICAL DATA:  Mesenteric ischemia, acute.  Epigastric pain EXAM: CTA ABDOMEN AND PELVIS WITHOUT AND WITH CONTRAST TECHNIQUE: Multidetector CT imaging of the abdomen and pelvis was performed using the standard protocol during bolus administration of intravenous contrast. Multiplanar reconstructed images and MIPs were obtained and reviewed to evaluate the vascular anatomy. RADIATION DOSE REDUCTION: This exam was performed according to the departmental dose-optimization program which includes automated exposure control, adjustment of the mA and/or kV according to patient size and/or use of iterative reconstruction technique. CONTRAST:  75mL OMNIPAQUE IOHEXOL 350 MG/ML SOLN COMPARISON:  01/09/2023 FINDINGS: VASCULAR Aorta: Atherosclerotic irregularity with  predominantly noncalcified plaque. No aneurysm or dissection. Celiac: Widely patent SMA: Widely patent Renals: 2 renal arteries bilaterally. Both right renal arteries are widely patent. The dominant superior left renal artery is widely patent. Small accessory renal artery to the left lower pole demonstrates mild narrowing at the origin. IMA: Widely patent Inflow: Patent without evidence of aneurysm, dissection, vasculitis or significant stenosis. Proximal Outflow: Bilateral common femoral and visualized portions of the superficial and profunda femoral arteries are patent without evidence of aneurysm, dissection, vasculitis or significant stenosis. Veins: No obvious venous abnormality within the limitations of this arterial phase study. Review of the MIP images confirms the above findings. NON-VASCULAR Lower chest: No acute abnormality Hepatobiliary: No focal hepatic abnormality. Gallbladder unremarkable.  Pancreas: No focal abnormality or ductal dilatation. Spleen: No focal abnormality.  Normal size. Adrenals/Urinary Tract: Low-density enlargement of the adrenal glands bilaterally, likely hyperplasia. No renal mass or hydronephrosis. Urinary bladder unremarkable. Stomach/Bowel: While the colon is decompressed and difficult to evaluate, there is questionable wall thickening at the splenic flexure and proximal descending colon suggesting infectious or inflammatory colitis. Stomach and small bowel decompressed. No bowel obstruction. Lymphatic: No adenopathy Reproductive: Prior hysterectomy.  No adnexal masses. Other: No free fluid or free air. Musculoskeletal: No acute bony abnormality. IMPRESSION: VASCULAR No evidence of aortic aneurysm or dissection. Mesenteric vessels widely patent. No significant vascular abnormality to suggest mesenteric ischemia. NON-VASCULAR Mild apparent colonic wall thickening at the splenic flexure and proximal descending colon which may reflect mild infectious or inflammatory colitis. Electronically Signed   By: Charlett Nose M.D.   On: 01/10/2023 00:06   CT ABDOMEN PELVIS W CONTRAST  Result Date: 01/09/2023 CLINICAL DATA:  Abdominal pain and vomiting. EXAM: CT ABDOMEN AND PELVIS WITH CONTRAST TECHNIQUE: Multidetector CT imaging of the abdomen and pelvis was performed using the standard protocol following bolus administration of intravenous contrast. RADIATION DOSE REDUCTION: This exam was performed according to the departmental dose-optimization program which includes automated exposure control, adjustment of the mA and/or kV according to patient size and/or use of iterative reconstruction technique. CONTRAST:  80mL OMNIPAQUE IOHEXOL 300 MG/ML  SOLN COMPARISON:  None Available. FINDINGS: Lower chest: No acute abnormality. Hepatobiliary: A punctate focus of parenchymal low attenuation is seen within the posterolateral aspect of the liver dome. No gallstones, gallbladder wall thickening, or  biliary dilatation. Pancreas: Unremarkable. No pancreatic ductal dilatation or surrounding inflammatory changes. Spleen: Normal in size without focal abnormality. Adrenals/Urinary Tract: There is mild, diffuse, bilateral adrenal gland enlargement. Kidneys are normal in size, without renal calculi or hydronephrosis. A subcentimeter simple cyst is seen within the anterior aspect of the mid left kidney. Bladder is unremarkable. Stomach/Bowel: Stomach is within normal limits. Appendix appears normal. No evidence of bowel dilatation. The splenic flexure is mildly thickened and poorly distended. Vascular/Lymphatic: No significant vascular findings are present. No enlarged abdominal or pelvic lymph nodes. Reproductive: Status post hysterectomy. No adnexal masses. Other: No abdominal wall hernia or abnormality. No abdominopelvic ascites. Musculoskeletal: Marked severity degenerative changes seen at the level of L5-S1. IMPRESSION: 1. No acute or active process within the abdomen or pelvis. 2. Subcentimeter simple left renal cyst. No follow-up imaging is recommended. This recommendation follows ACR consensus guidelines: Management of the Incidental Renal Mass on CT: A White Paper of the ACR Incidental Findings Committee. J Am Coll Radiol 2018;15:264-273. 3. Mild thickening of the splenic flexure which may be secondary to poor bowel distention. Mild colitis cannot be excluded. 4. Evidence of prior hysterectomy. 5. Marked severity degenerative changes  at the level of L5-S1. Electronically Signed   By: Aram Candela M.D.   On: 01/09/2023 22:23    EKG: I independently viewed the EKG done and my findings are as followed: Currently rate of 103 bpm with VPCs  Assessment/Plan Present on Admission:  Infectious colitis  Principal Problem:   Infectious colitis Active Problems:   Severe sepsis (HCC)   Abdominal pain   Nausea & vomiting   Hypokalemia   Hyperglycemia   Lactic acidosis   Thrombocytosis  Severe sepsis  secondary to infectious colitis Patient met sepsis criteria due to leukocytosis, tachycardia.  Source of infection was abdomen.  Patient also presents with lactic acidosis thereby meeting criteria for severe sepsis. CT abdomen and pelvis was suggestive of infectious colitis Patient was started on IV ciprofloxacin and metronidazole, we shall continue with same at this time Continue IV Dilaudid 0.5 mg every 3 hours as needed for moderate/severe pain Blood culture and procalcitonin pending  Lactic acidosis in the setting of above Lactic acid 5.1 > 5.9 > 4.3. Continue to trend lactic acid  Abdominal pain, nausea and vomiting possibly secondary to infectious colitis Continue IV NS at 75 mLs/Hr Continue IV antibiotics as described above Continue IV Dilaudid 0.5 mg q.3h p.r.n. for moderate to severe pain Continue IV Zofran p.r.n. Continue clear liquid diet with plan to advanced diet as tolerated  Hypokalemia K+ is 2.8 K+ will be replenished Please monitor for AM K+ for further replenishmemnt  Hyperglycemia Blood glucose was 300, patient denies any history of type 2 diabetes mellitus Hemoglobin A1c will be checked  Thrombocytosis possibly reactive Platelets 516, continue to monitor platelet levels with morning labs  DVT prophylaxis: Lovenox   Advance Care Planning: Full code (confirmed with patient and son at bedside)  Consults: None  Family Communication: Son at bedside (all questions answered to satisfaction)  Severity of Illness: The appropriate patient status for this patient is INPATIENT. Inpatient status is judged to be reasonable and necessary in order to provide the required intensity of service to ensure the patient's safety. The patient's presenting symptoms, physical exam findings, and initial radiographic and laboratory data in the context of their chronic comorbidities is felt to place them at high risk for further clinical deterioration. Furthermore, it is not anticipated  that the patient will be medically stable for discharge from the hospital within 2 midnights of admission.   * I certify that at the point of admission it is my clinical judgment that the patient will require inpatient hospital care spanning beyond 2 midnights from the point of admission due to high intensity of service, high risk for further deterioration and high frequency of surveillance required.*  Author: Frankey Shown, DO 01/10/2023 5:57 AM  For on call review [email or web address].

## 2023-01-10 NOTE — ED Notes (Signed)
Date and time results received: 01/10/23 0240   Test: Lactic Critical Value: 5.9  Name of Provider Notified: Mesner, MD

## 2023-01-10 NOTE — Hospital Course (Signed)
77 y.o. female with medical history significant of rheumatoid arthritis to the emergency department accompanied by son due to pain which started this afternoon around 1:30 PM after eating watermelon abdominal pain was epigastric, pain was severe and was associated with nausea and nonbloody vomiting.  She denies chest pain, shortness of breath, fever, chills.   ED Course:  In the emergency department, she was intermittently tachypneic and tachycardic, but other vital signs were within normal range.  Workup in the ED showed WBC 20.5 with left shift, hemoglobin 10.7, hematocrit 31.9, MCV 90.4, platelets 516.  BMP showed sodium 134, potassium 2.8, bicarb 20, blood glucose 300.  Lipase level 25, magnesium 1.8, urinalysis was positive for glycosuria, lactic acid 5.1 > 5.9 > 4.3.  Blood culture pending.  CT angiography of abdomen and pelvis without and with contrast showed no evidence of aortic aneurysm or dissection as enteric vessels widely patent.  No significant vascular abnormality to suggest mesenteric ischemia. Mild apparent colonic wall thickening at the splenic flexure and proximal descending colon which may reflect mild infectious or inflammatory colitis.  CT abdomen and pelvis with contrast showed no acute or active process within the abdomen or pelvis.  Mild thickening of the splenic flexure which may be secondary to poor bowel distention. Mild colitis cannot be excluded.  Patient was treated with IV ciprofloxacin and metronidazole, pain medication was given, potassium was replenished, Zofran was also given.  Hospitalist was asked to admit patient for further evaluation and management.

## 2023-01-10 NOTE — Progress Notes (Signed)
ASSUMPTION OF CARE NOTE   01/10/2023 1:19 PM  Tomma Rakers Zeller was seen and examined.  The H&P by the admitting provider, orders, imaging was reviewed.  Please see new orders.  Will continue to follow.   Vitals:   01/10/23 1035 01/10/23 1100  BP: (!) 143/57 (!) 153/66  Pulse: 81 83  Resp: 13 (!) 21  Temp: (!) 97.4 F (36.3 C)   SpO2: 98% 98%    Results for orders placed or performed during the hospital encounter of 01/09/23  Blood culture (routine x 2)   Specimen: Right Antecubital; Blood  Result Value Ref Range   Specimen Description RIGHT ANTECUBITAL    Special Requests      BOTTLES DRAWN AEROBIC AND ANAEROBIC Blood Culture adequate volume   Culture      NO GROWTH < 12 HOURS Performed at [facility], [address] [place], [place] [address]    Report Status PENDING   Blood culture (routine x 2)   Specimen: BLOOD LEFT HAND  Result Value Ref Range   Specimen Description BLOOD LEFT HAND    Special Requests      BOTTLES DRAWN AEROBIC AND ANAEROBIC Blood Culture adequate volume   Culture      NO GROWTH < 12 HOURS Performed at [facility] [address], [place], [place] [address]    Report Status PENDING   Urinalysis, Routine w reflex microscopic -Urine, Clean Catch  Result Value Ref Range   Color, Urine YELLOW YELLOW   APPearance CLEAR CLEAR   Specific Gravity, Urine 1.016 1.005 - 1.030   pH 6.0 5.0 - 8.0   Glucose, UA >=500 (A) NEGATIVE mg/dL   Hgb urine dipstick SMALL (A) NEGATIVE   Bilirubin Urine NEGATIVE NEGATIVE   Ketones, ur 5 (A) NEGATIVE mg/dL   Protein, ur 098 (A) NEGATIVE mg/dL   Nitrite NEGATIVE NEGATIVE   Leukocytes,Ua TRACE (A) NEGATIVE   RBC / HPF 6-10 0 - 5 RBC/hpf   WBC, UA 0-5 0 - 5 WBC/hpf   Bacteria, UA NONE SEEN NONE SEEN   Squamous Epithelial / HPF 0-5 0 - 5 /HPF   Mucus PRESENT   Lipase, blood  Result Value Ref Range   Lipase 25 11 - 51 U/L  Comprehensive metabolic panel  Result Value Ref Range   Sodium 134 (L) 135 - 145  mmol/L   Potassium 2.8 (L) 3.5 - 5.1 mmol/L   Chloride 101 98 - 111 mmol/L   CO2 20 (L) 22 - 32 mmol/L   Glucose, Bld 300 (H) 70 - 99 mg/dL   BUN 12 8 - 23 mg/dL   Creatinine, Ser 1.19 0.44 - 1.00 mg/dL   Calcium 8.6 (L) 8.9 - 10.3 mg/dL   Total Protein 7.9 6.5 - 8.1 g/dL   Albumin 3.6 3.5 - 5.0 g/dL   AST 19 15 - 41 U/L   ALT 9 0 - 44 U/L   Alkaline Phosphatase 94 38 - 126 U/L   Total Bilirubin 0.4 0.3 - 1.2 mg/dL   GFR, Estimated >14 >78 mL/min   Anion gap 13 5 - 15  CBC with Differential  Result Value Ref Range   WBC 20.5 (H) 4.0 - 10.5 K/uL   RBC 3.53 (L) 3.87 - 5.11 MIL/uL   Hemoglobin 10.7 (L) 12.0 - 15.0 g/dL   HCT 29.5 (L) 62.1 - 30.8 %   MCV 90.4 80.0 - 100.0 fL   MCH 30.3 26.0 - 34.0 pg   MCHC 33.5 30.0 - 36.0  g/dL   RDW 16.1 (H) 09.6 - 04.5 %   Platelets 516 (H) 150 - 400 K/uL   nRBC 0.0 0.0 - 0.2 %   Neutrophils Relative % 91 %   Neutro Abs 18.8 (H) 1.7 - 7.7 K/uL   Lymphocytes Relative 4 %   Lymphs Abs 0.7 0.7 - 4.0 K/uL   Monocytes Relative 3 %   Monocytes Absolute 0.6 0.1 - 1.0 K/uL   Eosinophils Relative 0 %   Eosinophils Absolute 0.0 0.0 - 0.5 K/uL   Basophils Relative 0 %   Basophils Absolute 0.1 0.0 - 0.1 K/uL   Immature Granulocytes 2 %   Abs Immature Granulocytes 0.34 (H) 0.00 - 0.07 K/uL  Magnesium  Result Value Ref Range   Magnesium 1.8 1.7 - 2.4 mg/dL  Lactic acid, plasma  Result Value Ref Range   Lactic Acid, Venous 5.1 (HH) 0.5 - 1.9 mmol/L  Lactic acid, plasma  Result Value Ref Range   Lactic Acid, Venous 5.9 (HH) 0.5 - 1.9 mmol/L  Lactic acid, plasma  Result Value Ref Range   Lactic Acid, Venous 4.3 (HH) 0.5 - 1.9 mmol/L  Lactic acid, plasma  Result Value Ref Range   Lactic Acid, Venous 1.8 0.5 - 1.9 mmol/L  Lactic acid, plasma  Result Value Ref Range   Lactic Acid, Venous 1.0 0.5 - 1.9 mmol/L  Procalcitonin  Result Value Ref Range   Procalcitonin <0.10 ng/mL  CBC  Result Value Ref Range   WBC 18.0 (H) 4.0 - 10.5 K/uL   RBC  3.42 (L) 3.87 - 5.11 MIL/uL   Hemoglobin 10.5 (L) 12.0 - 15.0 g/dL   HCT 40.9 (L) 81.1 - 91.4 %   MCV 89.5 80.0 - 100.0 fL   MCH 30.7 26.0 - 34.0 pg   MCHC 34.3 30.0 - 36.0 g/dL   RDW 78.2 (H) 95.6 - 21.3 %   Platelets 407 (H) 150 - 400 K/uL   nRBC 0.0 0.0 - 0.2 %  Comprehensive metabolic panel  Result Value Ref Range   Sodium 132 (L) 135 - 145 mmol/L   Potassium 3.8 3.5 - 5.1 mmol/L   Chloride 100 98 - 111 mmol/L   CO2 22 22 - 32 mmol/L   Glucose, Bld 164 (H) 70 - 99 mg/dL   BUN 8 8 - 23 mg/dL   Creatinine, Ser 0.86 0.44 - 1.00 mg/dL   Calcium 8.2 (L) 8.9 - 10.3 mg/dL   Total Protein 6.9 6.5 - 8.1 g/dL   Albumin 3.0 (L) 3.5 - 5.0 g/dL   AST 14 (L) 15 - 41 U/L   ALT 7 0 - 44 U/L   Alkaline Phosphatase 81 38 - 126 U/L   Total Bilirubin 0.3 0.3 - 1.2 mg/dL   GFR, Estimated >57 >84 mL/min   Anion gap 10 5 - 15  Phosphorus  Result Value Ref Range   Phosphorus 2.1 (L) 2.5 - 4.6 mg/dL     Maryln Manuel, MD [facility]   01/09/2023  8:10 PM How to contact the [facility] Attending or Consulting provider 7A - 7P or covering provider during after hours 7P -7A, for this patient?  Check the care team in [facility] and look for a) attending/consulting [facility] provider listed and b) the [facility] team listed Log into [email or web address] and use [place]'s universal password to access. If you do not have the password, please contact the hospital operator. Locate the [facility] provider you are looking for under [facility] and page to a number  that you can be directly reached. If you still have difficulty reaching the provider, please page the [facility] (Director on Call) for the Hospitalists listed on amion for assistance.

## 2023-01-10 NOTE — ED Notes (Signed)
Pt took a sip of liquid and began to vomit. Also c/o of epigastric pain. MD made aware

## 2023-01-10 NOTE — Progress Notes (Signed)
   01/10/23 1056  TOC Brief Assessment  Insurance and Status Reviewed  Patient has primary care physician Yes  Home environment has been reviewed Home  Prior level of function: Ind  Prior/Current Home Services No current home services  Social Determinants of Health Reivew SDOH reviewed no interventions necessary  Readmission risk has been reviewed Yes [place] Si)  Transition of care needs no transition of care needs at this time

## 2023-01-11 DIAGNOSIS — A09 Infectious gastroenteritis and colitis, unspecified: Secondary | ICD-10-CM | POA: Diagnosis not present

## 2023-01-11 DIAGNOSIS — R1084 Generalized abdominal pain: Secondary | ICD-10-CM

## 2023-01-11 DIAGNOSIS — E876 Hypokalemia: Secondary | ICD-10-CM | POA: Diagnosis not present

## 2023-01-11 LAB — BASIC METABOLIC PANEL
Anion gap: 8 (ref 5–15)
BUN: 5 mg/dL — ABNORMAL LOW (ref 8–23)
CO2: 24 mmol/L (ref 22–32)
Calcium: 8.6 mg/dL — ABNORMAL LOW (ref 8.9–10.3)
Chloride: 96 mmol/L — ABNORMAL LOW (ref 98–111)
Creatinine, Ser: 0.8 mg/dL (ref 0.44–1.00)
GFR, Estimated: >60 mL/min (ref >60)
Glucose, Bld: 109 mg/dL — ABNORMAL HIGH (ref 70–99)
Potassium: 3.8 mmol/L (ref 3.5–5.1)
Sodium: 128 mmol/L — ABNORMAL LOW (ref 135–145)

## 2023-01-11 LAB — CBC WITH DIFFERENTIAL/PLATELET
Abs Immature Granulocytes: 0.6 10*3/uL — ABNORMAL HIGH (ref 0.00–0.07)
Basophils Absolute: 0.1 10*3/uL (ref 0.0–0.1)
Basophils Relative: 0 %
Eosinophils Absolute: 0 10*3/uL (ref 0.0–0.5)
Eosinophils Relative: 0 %
HCT: 33 % — ABNORMAL LOW (ref 36.0–46.0)
Hemoglobin: 11.2 g/dL — ABNORMAL LOW (ref 12.0–15.0)
Immature Granulocytes: 4 %
Lymphocytes Relative: 5 %
Lymphs Abs: 0.8 10*3/uL (ref 0.7–4.0)
MCH: 30.4 pg (ref 26.0–34.0)
MCHC: 33.9 g/dL (ref 30.0–36.0)
MCV: 89.7 fL (ref 80.0–100.0)
Monocytes Absolute: 1.4 10*3/uL — ABNORMAL HIGH (ref 0.1–1.0)
Monocytes Relative: 9 %
Neutro Abs: 13.1 10*3/uL — ABNORMAL HIGH (ref 1.7–7.7)
Neutrophils Relative %: 82 %
Platelets: 308 10*3/uL (ref 150–400)
RBC: 3.68 MIL/uL — ABNORMAL LOW (ref 3.87–5.11)
RDW: 16.5 % — ABNORMAL HIGH (ref 11.5–15.5)
WBC: 16 10*3/uL — ABNORMAL HIGH (ref 4.0–10.5)
nRBC: 0 % (ref 0.0–0.2)

## 2023-01-11 LAB — HEMOGLOBIN A1C
Hgb A1c MFr Bld: 5.8 % — ABNORMAL HIGH (ref 4.8–5.6)
Mean Plasma Glucose: 120 mg/dL

## 2023-01-11 LAB — MAGNESIUM: Magnesium: 2 mg/dL (ref 1.7–2.4)

## 2023-01-11 NOTE — Progress Notes (Signed)
   01/11/23 1510  Spiritual Encounters  Type of Visit Initial  Care provided to: Pt and family  Conversation partners present during encounter Nurse  Referral source Patient request  Reason for visit Routine spiritual support  OnCall Visit No  Spiritual Framework  Presenting Themes Meaning/purpose/sources of inspiration;Impactful experiences and emotions;Courage hope and growth;Significant life change  Community/Connection Family;Friend(s);Faith community  Patient Stress Factors Health changes  Family Stress Factors Health changes  Interventions  Spiritual Care Interventions Made Established relationship of care and support;Compassionate presence;Reflective listening;Normalization of emotions;Prayer;Encouragement  Intervention Outcomes  Outcomes Connection to spiritual care;Awareness of support;Reduced fear;Autonomy/agency  Spiritual Care Plan  Spiritual Care Issues Still Outstanding Chaplain will continue to follow   Referred to patient through consult for prayer. Found Laurie Wallace sitting in recliner in hospital room with son [name] bedside today. She smiled when Chaplain introduced self and welcomed this Clinical research associate warmly to bedside. Chaplain engaged her in reflection around her recent illness that brought her to the hospital and what her hopes are for returning home. She shared she worked for 36 years at [employer] and retired recently in March of 2024. She stated that she is feeling much better and looks forward to returning home and enjoying some retirement time. Chaplain provided prayer to close the visit and will remain available in order to provide spiritual support and to assess for spiritual need.   Rev. Jolyn Lent, M.Div Chaplain

## 2023-01-11 NOTE — Progress Notes (Signed)
PROGRESS NOTE    Laurie Wallace  ZOX:[id number] DOB: 06-20-1946 DOA: 01/09/2023 PCP: Anabel Halon, MD   Brief Narrative:  Laurie Wallace is a 77 y.o. female with medical history significant of rheumatoid arthritis who presented with severe  abdominal pain. nausea and nonbloody vomiting.   In the emergency department, she was intermittently tachypneic and tachycardic, but other vital signs were within normal range.  Workup in the ED showed WBC 20.5 with left shift, hemoglobin 10.7, platelets 516.  BMP showed potassium 2.8, lactic acid peaked at 5.9 and normalized after volume resuscitation.  7/6 CT angiography of abdomen and pelvis with mild apparent colonic wall thickening at the splenic flexure and proximal descending colon which may reflect mild infectious or inflammatory colitis. Patient was started on IV ciprofloxacin and metronidazole on 7/7, pain medication was given, potassium was replenished, Zofran was also given. This morning Laurie Wallace notes that her nausea continues but Zofran and Compazine are helping.    Assessment & Plan:   Principal Problem:   Infectious colitis Active Problems:   Severe sepsis (HCC)   Abdominal pain   Nausea & vomiting   Hypokalemia   Hyperglycemia   Lactic acidosis   Thrombocytosis  Assessment and Plan:  Infectious colitis Presented with severe sepsis criteria due to leukocytosis, tachycardia, lactate and source of infection from 7/6 CT A/P c/w infectious colitis at splenic flexure and proximal descending colon. WBCs have improved to 16k, Procal <.10, lactate has normalized, and her tachycardia has resolved. On Ciprofloxacin and Metronidazole (initiated 7/7), pain is much better, but nausea has continued with improvement alongside Zofran and Compazine. On clear liquid diet but not tolerating this AM, will seek to advance slowly. Following blood cultures x2. - Advance clear liquid diet as tolerated - Continue prn Zofran  and Compazine - Continue pain control with Tylenol for mild, Dilaudid 0.25-0.5mg  for mod to severe pain - Continue Ciprofloxacin and Metronidazole (Initiated 7/7)  Hyponatremia Likely 2/2 poor po intake secondary to above infectious colitis. Sodium at 128 today, down from 134 yesterday. Would like to continue mIVF today with expectation for PO intake to improve. - Resume 17ml/hr mIVF NS - Boost TID between meals - Advance diet as toletrated  Hypokalemia K normalized today after supplementation.  - Will continue to monitor  Thrombocytosis Was >500k on presentation, likely reactive thrombocytosis 2/2 infectious colitis, and has normalized today to 308k.  - Will continue to monitor   DVT prophylaxis: Lovenox Code Status: Full Family Communication: Will ask pt   Consultants:  None  Procedures:  None  Antimicrobials:  Initiated Ciprofloxacin and Metronidazole on 7/7   Subjective: Patient seen and evaluated today. She notes that she has not vomited overnight nor this morning. She began to eat a bite of her soup this AM but felt nauseous and asked for prn anti-nausea medication, and wasn't able to continue. Notes that her previous abdominal pain is much improved.   Objective: Vitals:   01/10/23 2007 01/10/23 2025 01/10/23 2309 01/11/23 0455  BP: (!) 160/66 (!) 171/79  (!) 146/94  Pulse: 83 81 86 87  Resp: 20 20  16   Temp: 99.4 F (37.4 C) 99 F (37.2 C)  98.9 F (37.2 C)  TempSrc:    Oral  SpO2: 97% 98%  99%  Weight:      Height:        Intake/Output Summary (Last 24 hours) at 01/11/2023 0944 Last data filed at 01/11/2023 0610 Gross per 24 hour  Intake 240  ml  Output --  Net 240 ml   Filed Weights   01/09/23 2007 01/10/23 1350  Weight: 56 kg 56.7 kg    Examination:  General exam: Appears calm and mildly uncomfortable  Respiratory system: Clear to auscultation. Respiratory effort normal. Cardiovascular system: S1 & S2 heard, RRR.  Gastrointestinal system:  Abdomen is soft, tender, bowel sounds present Central nervous system: Alert and awake Extremities: No edema Skin: No significant lesions noted Psychiatry: Flat affect.    Data Reviewed: I have personally reviewed following labs and imaging studies  CBC: Recent Labs  Lab 01/09/23 2021 01/10/23 0610 01/11/23 0358  WBC 20.5* 18.0* 16.0*  NEUTROABS 18.8*  --  13.1*  HGB 10.7* 10.5* 11.2*  HCT 31.9* 30.6* 33.0*  MCV 90.4 89.5 89.7  PLT 516* 407* 308   Basic Metabolic Panel: Recent Labs  Lab 01/09/23 2021 01/10/23 0610 01/11/23 0358  NA 134* 132* 128*  K 2.8* 3.8 3.8  CL 101 100 96*  CO2 20* 22 24  GLUCOSE 300* 164* 109*  BUN 12 8 5*  CREATININE 0.91 0.63 0.80  CALCIUM 8.6* 8.2* 8.6*  MG 1.8  --  2.0  PHOS  --  2.1*  --    GFR: Estimated Creatinine Clearance: 52.7 mL/min (by C-G formula based on SCr of 0.8 mg/dL). Liver Function Tests: Recent Labs  Lab 01/09/23 2021 01/10/23 0610  AST 19 14*  ALT 9 7  ALKPHOS 94 81  BILITOT 0.4 0.3  PROT 7.9 6.9  ALBUMIN 3.6 3.0*   Recent Labs  Lab 01/09/23 2021  LIPASE 25   No results for input(s): "AMMONIA" in the last 168 hours. Coagulation Profile: No results for input(s): "INR", "PROTIME" in the last 168 hours. Cardiac Enzymes: No results for input(s): "CKTOTAL", "CKMB", "CKMBINDEX", "TROPONINI" in the last 168 hours. BNP (last 3 results) No results for input(s): "PROBNP" in the last 8760 hours. HbA1C: Recent Labs    01/10/23 0610  HGBA1C 5.8*   CBG: No results for input(s): "GLUCAP" in the last 168 hours. Lipid Profile: No results for input(s): "CHOL", "HDL", "LDLCALC", "TRIG", "CHOLHDL", "LDLDIRECT" in the last 72 hours. Thyroid Function Tests: No results for input(s): "TSH", "T4TOTAL", "FREET4", "T3FREE", "THYROIDAB" in the last 72 hours. Anemia Panel: No results for input(s): "VITAMINB12", "FOLATE", "FERRITIN", "TIBC", "IRON", "RETICCTPCT" in the last 72 hours. Sepsis Labs: Recent Labs  Lab  01/10/23 0149 01/10/23 0430 01/10/23 0610 01/10/23 1030  PROCALCITON  --   --  <0.10  --   LATICACIDVEN 5.9* 4.3* 1.8 1.0    Recent Results (from the past 240 hour(s))  Blood culture (routine x 2)     Status: None (Preliminary result)   Collection Time: 01/10/23  1:49 AM   Specimen: Right Antecubital; Blood  Result Value Ref Range Status   Specimen Description RIGHT ANTECUBITAL  Final   Special Requests   Final    BOTTLES DRAWN AEROBIC AND ANAEROBIC Blood Culture adequate volume   Culture   Final    NO GROWTH 1 DAY Performed at [facility], [address] [place], [place] [address]    Report Status PENDING  Incomplete  Blood culture (routine x 2)     Status: None (Preliminary result)   Collection Time: 01/10/23  3:17 AM   Specimen: BLOOD LEFT HAND  Result Value Ref Range Status   Specimen Description BLOOD LEFT HAND  Final   Special Requests   Final    BOTTLES DRAWN AEROBIC AND ANAEROBIC Blood Culture adequate volume  Culture   Final    NO GROWTH 1 DAY Performed at [facility], [address], [place], [place] [address]    Report Status PENDING  Incomplete         Radiology Studies: CT Angio Abd/Pel W and/or Wo Contrast  Result Date: 01/10/2023 CLINICAL DATA:  Mesenteric ischemia, acute.  Epigastric pain EXAM: CTA ABDOMEN AND PELVIS WITHOUT AND WITH CONTRAST TECHNIQUE: Multidetector CT imaging of the abdomen and pelvis was performed using the standard protocol during bolus administration of intravenous contrast. Multiplanar reconstructed images and MIPs were obtained and reviewed to evaluate the vascular anatomy. RADIATION DOSE REDUCTION: This exam was performed according to the departmental dose-optimization program which includes automated exposure control, adjustment of the mA and/or kV according to patient size and/or use of iterative reconstruction technique. CONTRAST:  75mL OMNIPAQUE IOHEXOL 350 MG/ML SOLN COMPARISON:  01/09/2023 FINDINGS: VASCULAR Aorta:  Atherosclerotic irregularity with predominantly noncalcified plaque. No aneurysm or dissection. Celiac: Widely patent SMA: Widely patent Renals: 2 renal arteries bilaterally. Both right renal arteries are widely patent. The dominant superior left renal artery is widely patent. Small accessory renal artery to the left lower pole demonstrates mild narrowing at the origin. IMA: Widely patent Inflow: Patent without evidence of aneurysm, dissection, vasculitis or significant stenosis. Proximal Outflow: Bilateral common femoral and visualized portions of the superficial and profunda femoral arteries are patent without evidence of aneurysm, dissection, vasculitis or significant stenosis. Veins: No obvious venous abnormality within the limitations of this arterial phase study. Review of the MIP images confirms the above findings. NON-VASCULAR Lower chest: No acute abnormality Hepatobiliary: No focal hepatic abnormality. Gallbladder unremarkable. Pancreas: No focal abnormality or ductal dilatation. Spleen: No focal abnormality.  Normal size. Adrenals/Urinary Tract: Low-density enlargement of the adrenal glands bilaterally, likely hyperplasia. No renal mass or hydronephrosis. Urinary bladder unremarkable. Stomach/Bowel: While the colon is decompressed and difficult to evaluate, there is questionable wall thickening at the splenic flexure and proximal descending colon suggesting infectious or inflammatory colitis. Stomach and small bowel decompressed. No bowel obstruction. Lymphatic: No adenopathy Reproductive: Prior hysterectomy.  No adnexal masses. Other: No free fluid or free air. Musculoskeletal: No acute bony abnormality. IMPRESSION: VASCULAR No evidence of aortic aneurysm or dissection. Mesenteric vessels widely patent. No significant vascular abnormality to suggest mesenteric ischemia. NON-VASCULAR Mild apparent colonic wall thickening at the splenic flexure and proximal descending colon which may reflect mild  infectious or inflammatory colitis. Electronically Signed   By: Charlett Nose M.D.   On: 01/10/2023 00:06   CT ABDOMEN PELVIS W CONTRAST  Result Date: 01/09/2023 CLINICAL DATA:  Abdominal pain and vomiting. EXAM: CT ABDOMEN AND PELVIS WITH CONTRAST TECHNIQUE: Multidetector CT imaging of the abdomen and pelvis was performed using the standard protocol following bolus administration of intravenous contrast. RADIATION DOSE REDUCTION: This exam was performed according to the departmental dose-optimization program which includes automated exposure control, adjustment of the mA and/or kV according to patient size and/or use of iterative reconstruction technique. CONTRAST:  80mL OMNIPAQUE IOHEXOL 300 MG/ML  SOLN COMPARISON:  None Available. FINDINGS: Lower chest: No acute abnormality. Hepatobiliary: A punctate focus of parenchymal low attenuation is seen within the posterolateral aspect of the liver dome. No gallstones, gallbladder wall thickening, or biliary dilatation. Pancreas: Unremarkable. No pancreatic ductal dilatation or surrounding inflammatory changes. Spleen: Normal in size without focal abnormality. Adrenals/Urinary Tract: There is mild, diffuse, bilateral adrenal gland enlargement. Kidneys are normal in size, without renal calculi or hydronephrosis. A subcentimeter simple cyst is seen within the anterior  aspect of the mid left kidney. Bladder is unremarkable. Stomach/Bowel: Stomach is within normal limits. Appendix appears normal. No evidence of bowel dilatation. The splenic flexure is mildly thickened and poorly distended. Vascular/Lymphatic: No significant vascular findings are present. No enlarged abdominal or pelvic lymph nodes. Reproductive: Status post hysterectomy. No adnexal masses. Other: No abdominal wall hernia or abnormality. No abdominopelvic ascites. Musculoskeletal: Marked severity degenerative changes seen at the level of L5-S1. IMPRESSION: 1. No acute or active process within the abdomen or  pelvis. 2. Subcentimeter simple left renal cyst. No follow-up imaging is recommended. This recommendation follows ACR consensus guidelines: Management of the Incidental Renal Mass on CT: A White Paper of the ACR Incidental Findings Committee. J Am Coll Radiol 2018;15:264-273. 3. Mild thickening of the splenic flexure which may be secondary to poor bowel distention. Mild colitis cannot be excluded. 4. Evidence of prior hysterectomy. 5. Marked severity degenerative changes at the level of L5-S1. Electronically Signed   By: Aram Candela M.D.   On: 01/09/2023 22:23        Scheduled Meds:  enoxaparin (LOVENOX) injection  40 mg Subcutaneous Q24H   feeding supplement  1 Container Oral TID BM   folic acid  1 mg Oral Daily   loratadine  10 mg Oral q1800   pantoprazole  40 mg Oral QPM   Continuous Infusions:  sodium chloride 60 mL/hr at 01/11/23 0829   ciprofloxacin 400 mg (01/11/23 0301)   metronidazole 500 mg (01/11/23 0548)     LOS: 1 day    Time spent: 35 minutes  Signed,  Marcelline Mates, MS4 Working with Dr. Standley Dakins

## 2023-01-11 NOTE — Evaluation (Signed)
Physical Therapy Evaluation Patient Details Name: Laurie Wallace MRN: [id number] DOB: [birth year] Today's Date: 01/11/2023  History of Present Illness  Laurie Wallace is a 77 y.o. female with medical history significant of rheumatoid arthritis to the emergency department accompanied by son due to pain which started this afternoon around 1:30 PM after eating watermelon abdominal pain was epigastric, pain was severe and was associated with nausea and nonbloody vomiting.  She denies chest pain, shortness of breath, fever, chills.   Clinical Impression  Patient functioning near baseline for functional mobility and gait demonstrating good return for bed mobility, transfers and ambulating in room/hallway without loss of balance.  Plan:  Patient discharged from physical therapy to care of nursing for ambulation daily as tolerated for length of stay.          Assistance Recommended at Discharge PRN  If plan is discharge home, recommend the following:  Can travel by private vehicle  Other (comment) (near baseline)        Equipment Recommendations None recommended by PT  Recommendations for Other Services       Functional Status Assessment Patient has not had a recent decline in their functional status     Precautions / Restrictions Precautions Precautions: None Restrictions Weight Bearing Restrictions: No      Mobility  Bed Mobility Overal bed mobility: Modified Independent                  Transfers Overall transfer level: Modified independent                      Ambulation/Gait Ambulation/Gait assistance: Modified independent (Device/Increase time) Gait Distance (Feet): 100 Feet Assistive device: None Gait Pattern/deviations: WFL(Within Functional Limits) Gait velocity: slightly decreased     General Gait Details: grossly WFL with slightly labored cadence, good return for ambulating in room, hallway without requiring use of an AD and  no loss of balance  Stairs            Wheelchair Mobility     Tilt Bed    Modified Rankin (Stroke Patients Only)       Balance Overall balance assessment: No apparent balance deficits (not formally assessed)                                           Pertinent Vitals/Pain Pain Assessment Pain Assessment: No/denies pain    Home Living Family/patient expects to be discharged to:: Private residence Living Arrangements: Alone Available Help at Discharge: Family;Available PRN/intermittently Type of Home: House Home Access: Stairs to enter Entrance Stairs-Rails: Right;Left;Can reach both Entrance Stairs-Number of Steps: 2-3   Home Layout: One level Home Equipment: Grab bars - tub/shower;Shower seat;BSC/3in1      Prior Function Prior Level of Function : Independent/Modified Independent;Driving             Mobility Comments: Tourist information centre manager, drive, shops ADLs Comments: Independent     Hand Dominance        Extremity/Trunk Assessment   Upper Extremity Assessment Upper Extremity Assessment: Overall WFL for tasks assessed    Lower Extremity Assessment Lower Extremity Assessment: Overall WFL for tasks assessed    Cervical / Trunk Assessment Cervical / Trunk Assessment: Normal  Communication   Communication: No difficulties  Cognition Arousal/Alertness: Awake/alert Behavior During Therapy: WFL for tasks assessed/performed Overall Cognitive Status: Within Functional Limits for tasks assessed  General Comments      Exercises     Assessment/Plan    PT Assessment Patient does not need any further PT services  PT Problem List         PT Treatment Interventions      PT Goals (Current goals can be found in the Care Plan section)  Acute Rehab PT Goals Patient Stated Goal: return home with family to assist PT Goal Formulation: With patient Time For Goal Achievement:  01/11/23 Potential to Achieve Goals: Good    Frequency       Co-evaluation               AM-PAC PT "6 Clicks" Mobility  Outcome Measure Help needed turning from your back to your side while in a flat bed without using bedrails?: None Help needed moving from lying on your back to sitting on the side of a flat bed without using bedrails?: None Help needed moving to and from a bed to a chair (including a wheelchair)?: None Help needed standing up from a chair using your arms (e.g., wheelchair or bedside chair)?: None Help needed to walk in hospital room?: None Help needed climbing 3-5 steps with a railing? : None 6 Click Score: 24    End of Session   Activity Tolerance: Patient tolerated treatment well Patient left: in chair;with call bell/phone within reach Nurse Communication: Mobility status PT Visit Diagnosis: Unsteadiness on feet (R26.81);Other abnormalities of gait and mobility (R26.89);Muscle weakness (generalized) (M62.81)    Time: 1610-9604 PT Time Calculation (min) (ACUTE ONLY): 16 min   Charges:   PT Evaluation $PT Eval Low Complexity: 1 Low PT Treatments $Therapeutic Activity: 8-22 mins PT General Charges $$ ACUTE PT VISIT: 1 Visit         2:58 PM, 01/11/23 Ocie Bob, MPT Physical Therapist with [facility] [phone] office [phone] mobile phone

## 2023-01-11 NOTE — Hospital Course (Signed)
Reganne Gering is a 77 y.o. female with medical history significant of rheumatoid arthritis who presented with severe  abdominal pain. nausea and nonbloody vomiting.   In the emergency department, she was intermittently tachypneic and tachycardic, but other vital signs were within normal range.  Workup in the ED showed WBC 20.5 with left shift, hemoglobin 10.7, platelets 516.  BMP showed potassium 2.8, lactic acid peaked at 5.9 and normalized after volume resuscitation.  7/6 CT angiography of abdomen and pelvis with mild apparent colonic wall thickening at the splenic flexure and proximal descending colon which may reflect mild infectious or inflammatory colitis. Patient was started on IV ciprofloxacin and metronidazole on 7/7, pain medication was given, potassium was replenished, Zofran was also given. This morning Ms Narcisse notes that she had some apple sauce and yogurt but was limited by nausea and some abdominal pain, both of which are improving with prns.

## 2023-01-12 DIAGNOSIS — A419 Sepsis, unspecified organism: Secondary | ICD-10-CM | POA: Diagnosis not present

## 2023-01-12 DIAGNOSIS — A09 Infectious gastroenteritis and colitis, unspecified: Secondary | ICD-10-CM | POA: Diagnosis not present

## 2023-01-12 DIAGNOSIS — R112 Nausea with vomiting, unspecified: Secondary | ICD-10-CM | POA: Diagnosis not present

## 2023-01-12 DIAGNOSIS — E876 Hypokalemia: Secondary | ICD-10-CM | POA: Diagnosis not present

## 2023-01-12 LAB — CBC WITH DIFFERENTIAL/PLATELET
Abs Immature Granulocytes: 0.3 10*3/uL — ABNORMAL HIGH (ref 0.00–0.07)
Basophils Absolute: 0 10*3/uL (ref 0.0–0.1)
Basophils Relative: 0 %
Eosinophils Absolute: 0.2 10*3/uL (ref 0.0–0.5)
Eosinophils Relative: 1 %
HCT: 34.4 % — ABNORMAL LOW (ref 36.0–46.0)
Hemoglobin: 11.9 g/dL — ABNORMAL LOW (ref 12.0–15.0)
Lymphocytes Relative: 4 %
Lymphs Abs: 0.6 10*3/uL — ABNORMAL LOW (ref 0.7–4.0)
MCH: 30.4 pg (ref 26.0–34.0)
MCHC: 34.6 g/dL (ref 30.0–36.0)
MCV: 88 fL (ref 80.0–100.0)
Metamyelocytes Relative: 1 %
Monocytes Absolute: 1.3 10*3/uL — ABNORMAL HIGH (ref 0.1–1.0)
Monocytes Relative: 8 %
Myelocytes: 1 %
Neutro Abs: 13.6 10*3/uL — ABNORMAL HIGH (ref 1.7–7.7)
Neutrophils Relative %: 85 %
Platelets: 325 10*3/uL (ref 150–400)
RBC: 3.91 MIL/uL (ref 3.87–5.11)
RDW: 16.6 % — ABNORMAL HIGH (ref 11.5–15.5)
WBC: 16 10*3/uL — ABNORMAL HIGH (ref 4.0–10.5)
nRBC: 0 % (ref 0.0–0.2)

## 2023-01-12 LAB — BASIC METABOLIC PANEL
Anion gap: 12 (ref 5–15)
BUN: 8 mg/dL (ref 8–23)
CO2: 21 mmol/L — ABNORMAL LOW (ref 22–32)
Calcium: 8.4 mg/dL — ABNORMAL LOW (ref 8.9–10.3)
Chloride: 99 mmol/L (ref 98–111)
Creatinine, Ser: 0.84 mg/dL (ref 0.44–1.00)
GFR, Estimated: >60 mL/min (ref >60)
Glucose, Bld: 89 mg/dL (ref 70–99)
Potassium: 3.2 mmol/L — ABNORMAL LOW (ref 3.5–5.1)
Sodium: 132 mmol/L — ABNORMAL LOW (ref 135–145)

## 2023-01-12 MED ORDER — ALUM & MAG HYDROXIDE-SIMETH 200-200-20 MG/5ML PO SUSP
15.0000 mL | Freq: Once | ORAL | Status: AC
  Administered 2023-01-12: 15 mL via ORAL
  Filled 2023-01-12: qty 30

## 2023-01-12 MED ORDER — SIMETHICONE 80 MG PO CHEW
80.0000 mg | CHEWABLE_TABLET | Freq: Four times a day (QID) | ORAL | Status: DC | PRN
  Administered 2023-01-14: 80 mg via ORAL
  Filled 2023-01-12: qty 1

## 2023-01-12 MED ORDER — SACCHAROMYCES BOULARDII 250 MG PO CAPS
250.0000 mg | ORAL_CAPSULE | Freq: Two times a day (BID) | ORAL | Status: DC
  Administered 2023-01-12 – 2023-01-14 (×4): 250 mg via ORAL
  Filled 2023-01-12 (×4): qty 1

## 2023-01-12 MED ORDER — POTASSIUM CHLORIDE CRYS ER 20 MEQ PO TBCR
30.0000 meq | EXTENDED_RELEASE_TABLET | Freq: Once | ORAL | Status: AC
  Administered 2023-01-12: 30 meq via ORAL
  Filled 2023-01-12: qty 1

## 2023-01-12 MED ORDER — POTASSIUM CHLORIDE CRYS ER 20 MEQ PO TBCR
40.0000 meq | EXTENDED_RELEASE_TABLET | Freq: Once | ORAL | Status: AC
  Administered 2023-01-12: 40 meq via ORAL
  Filled 2023-01-12: qty 2

## 2023-01-12 NOTE — Progress Notes (Signed)
Patient tolerated medications whole, except for potassium given dissolved with applesauce. Ambulated patient in the hallway noted patient has steady gait. Patient verbalized some complaints of nausea and flatus. Patient given PRN Zofran and one time dose of Maalox, patient informed this writer it was helpful.

## 2023-01-12 NOTE — Progress Notes (Signed)
PROGRESS NOTE    Laurie Wallace  UJW:[id number] DOB: June 07, 1946 DOA: 01/09/2023 PCP: Anabel Halon, MD   Brief Narrative:  Laurie Wallace is a 77 y.o. female with medical history significant of rheumatoid arthritis who presented with severe  abdominal pain. nausea and nonbloody vomiting.   In the emergency department, she was intermittently tachypneic and tachycardic, but other vital signs were within normal range.  Workup in the ED showed WBC 20.5 with left shift, hemoglobin 10.7, platelets 516.  BMP showed potassium 2.8, lactic acid peaked at 5.9 and normalized after volume resuscitation.  7/6 CT angiography of abdomen and pelvis with mild apparent colonic wall thickening at the splenic flexure and proximal descending colon which may reflect mild infectious or inflammatory colitis. Patient was started on IV ciprofloxacin and metronidazole on 7/7, pain medication was given, potassium was replenished, Zofran was also given. This morning Laurie Wallace notes that she had some apple sauce and yogurt but was limited by nausea and some abdominal pain, both of which are improving with prns.    Assessment & Plan:   Principal Problem:   Infectious colitis Active Problems:   Severe sepsis (HCC)   Abdominal pain   Nausea & vomiting   Hypokalemia   Hyperglycemia   Lactic acidosis   Thrombocytosis  Assessment and Plan:  Infectious colitis Presented with severe sepsis criteria due to leukocytosis, tachycardia, lactate and source of infection from 7/6 CT A/P c/w infectious colitis at splenic flexure and proximal descending colon. WBCs have been stable at 16k, Procal <.10. On Ciprofloxacin and Metronidazole (initiated 7/7), pain is overall much better. Advanced to soft diet but limited by some nausea this AM, will continue to advance slowly. Blood cultures x2 with ngtd at 2 days. - Advance soft diet as tolerated - Continue prn Zofran and Compazine - Continue pain control  with Tylenol for mild, Dilaudid 0.25-0.5mg  for mod to severe pain - Continue Ciprofloxacin and Metronidazole (Initiated 7/7)  Hyponatremia Likely 2/2 poor po intake secondary to above infectious colitis. Sodium improved to 132 from 128 yesterday. Would like to continue mIVF today with expectation for PO intake to continue improve. - Continue 63ml/hr mIVF NS - Boost TID between meals - Advance diet as toletrated  Hypokalemia K lower again, at 3.2 today.   - Will replete  Thrombocytosis Resolved x2 days. Likely reactive 2/2 infectious colitis.    DVT prophylaxis: Lovenox Code Status: Full Family Communication: Will ask pt   Consultants:  None  Procedures:  None  Antimicrobials:  Initiated Ciprofloxacin and Metronidazole on 7/7   Subjective: Patient seen and evaluated today. She notes that she has not vomited overnight nor this morning. She began to eat a bite of her applesauce this AM but felt nauseous and asked for prn anti-nausea medication, and wasn't able to continue. Notes that her previous abdominal pain is much improved overall. Has not moved her bowels since Saturday morning, but denies feeling constipated, instead citing low PO intake over the last few days.  Objective: Vitals:   01/11/23 0455 01/11/23 1749 01/11/23 1956 01/12/23 0413  BP: (!) 146/94 (!) 175/88 (!) 169/92 137/86  Pulse: 87 93 96 (!) 109  Resp: 16 18 20 18   Temp: 98.9 F (37.2 C) 98.5 F (36.9 C)  98.2 F (36.8 C)  TempSrc: Oral Oral  Oral  SpO2: 99% 99% 99% 99%  Weight:      Height:        Intake/Output Summary (Last 24 hours) at 01/12/2023 1105  Last data filed at 01/12/2023 1100 Gross per 24 hour  Intake 2959.26 ml  Output --  Net 2959.26 ml   Filed Weights   01/09/23 2007 01/10/23 1350  Weight: 56 kg 56.7 kg    Examination:  General exam: Appears calm and mildly uncomfortable  Respiratory system: Clear to auscultation. Respiratory effort normal. Cardiovascular system: S1 & S2  heard, RRR.  Gastrointestinal system: Abdomen is soft, tender over LLQ, bowel sounds present Central nervous system: Alert and awake Extremities: No edema Skin: No significant lesions noted Psychiatry: Flat affect.    Data Reviewed: I have personally reviewed following labs and imaging studies  CBC: Recent Labs  Lab 01/09/23 2021 01/10/23 0610 01/11/23 0358 01/12/23 0400  WBC 20.5* 18.0* 16.0* 16.0*  NEUTROABS 18.8*  --  13.1* 13.6*  HGB 10.7* 10.5* 11.2* 11.9*  HCT 31.9* 30.6* 33.0* 34.4*  MCV 90.4 89.5 89.7 88.0  PLT 516* 407* 308 325   Basic Metabolic Panel: Recent Labs  Lab 01/09/23 2021 01/10/23 0610 01/11/23 0358 01/12/23 0400  NA 134* 132* 128* 132*  K 2.8* 3.8 3.8 3.2*  CL 101 100 96* 99  CO2 20* 22 24 21*  GLUCOSE 300* 164* 109* 89  BUN 12 8 5* 8  CREATININE 0.91 0.63 0.80 0.84  CALCIUM 8.6* 8.2* 8.6* 8.4*  MG 1.8  --  2.0  --   PHOS  --  2.1*  --   --    GFR: Estimated Creatinine Clearance: 50.2 mL/min (by C-G formula based on SCr of 0.84 mg/dL). Liver Function Tests: Recent Labs  Lab 01/09/23 2021 01/10/23 0610  AST 19 14*  ALT 9 7  ALKPHOS 94 81  BILITOT 0.4 0.3  PROT 7.9 6.9  ALBUMIN 3.6 3.0*   Recent Labs  Lab 01/09/23 2021  LIPASE 25   No results for input(s): "AMMONIA" in the last 168 hours. Coagulation Profile: No results for input(s): "INR", "PROTIME" in the last 168 hours. Cardiac Enzymes: No results for input(s): "CKTOTAL", "CKMB", "CKMBINDEX", "TROPONINI" in the last 168 hours. BNP (last 3 results) No results for input(s): "PROBNP" in the last 8760 hours. HbA1C: Recent Labs    01/10/23 0610  HGBA1C 5.8*   CBG: No results for input(s): "GLUCAP" in the last 168 hours. Lipid Profile: No results for input(s): "CHOL", "HDL", "LDLCALC", "TRIG", "CHOLHDL", "LDLDIRECT" in the last 72 hours. Thyroid Function Tests: No results for input(s): "TSH", "T4TOTAL", "FREET4", "T3FREE", "THYROIDAB" in the last 72 hours. Anemia  Panel: No results for input(s): "VITAMINB12", "FOLATE", "FERRITIN", "TIBC", "IRON", "RETICCTPCT" in the last 72 hours. Sepsis Labs: Recent Labs  Lab 01/10/23 0149 01/10/23 0430 01/10/23 0610 01/10/23 1030  PROCALCITON  --   --  <0.10  --   LATICACIDVEN 5.9* 4.3* 1.8 1.0    Recent Results (from the past 240 hour(s))  Blood culture (routine x 2)     Status: None (Preliminary result)   Collection Time: 01/10/23  1:49 AM   Specimen: Right Antecubital; Blood  Result Value Ref Range Status   Specimen Description RIGHT ANTECUBITAL  Final   Special Requests   Final    BOTTLES DRAWN AEROBIC AND ANAEROBIC Blood Culture adequate volume   Culture   Final    NO GROWTH 2 DAYS Performed at [facility], [address] [place], [place] [address]    Report Status PENDING  Incomplete  Blood culture (routine x 2)     Status: None (Preliminary result)   Collection Time: 01/10/23  3:17 AM  Specimen: BLOOD LEFT HAND  Result Value Ref Range Status   Specimen Description BLOOD LEFT HAND  Final   Special Requests   Final    BOTTLES DRAWN AEROBIC AND ANAEROBIC Blood Culture adequate volume   Culture   Final    NO GROWTH 2 DAYS Performed at [facility], [address] [place], [place] [address]    Report Status PENDING  Incomplete         Radiology Studies: No results found.      Scheduled Meds:  enoxaparin (LOVENOX) injection  40 mg Subcutaneous Q24H   feeding supplement  1 Container Oral TID BM   folic acid  1 mg Oral Daily   loratadine  10 mg Oral q1800   pantoprazole  40 mg Oral QPM   Continuous Infusions:  sodium chloride 60 mL/hr at 01/11/23 0829   ciprofloxacin 400 mg (01/12/23 0349)   metronidazole 500 mg (01/11/23 1711)     LOS: 2 days    Time spent: 35 minutes  Signed,  Marcelline Mates, MS4 Working with Dr. Standley Dakins

## 2023-01-13 DIAGNOSIS — E871 Hypo-osmolality and hyponatremia: Secondary | ICD-10-CM

## 2023-01-13 LAB — CBC WITH DIFFERENTIAL/PLATELET
Abs Immature Granulocytes: 1.07 10*3/uL — ABNORMAL HIGH (ref 0.00–0.07)
Basophils Absolute: 0.1 10*3/uL (ref 0.0–0.1)
Basophils Relative: 0 %
Eosinophils Absolute: 0.1 10*3/uL (ref 0.0–0.5)
Eosinophils Relative: 0 %
HCT: 33.7 % — ABNORMAL LOW (ref 36.0–46.0)
Hemoglobin: 11.4 g/dL — ABNORMAL LOW (ref 12.0–15.0)
Immature Granulocytes: 5 %
Lymphocytes Relative: 5 %
Lymphs Abs: 1 10*3/uL (ref 0.7–4.0)
MCH: 30.2 pg (ref 26.0–34.0)
MCHC: 33.8 g/dL (ref 30.0–36.0)
MCV: 89.2 fL (ref 80.0–100.0)
Monocytes Absolute: 1.7 10*3/uL — ABNORMAL HIGH (ref 0.1–1.0)
Monocytes Relative: 8 %
Neutro Abs: 16.5 10*3/uL — ABNORMAL HIGH (ref 1.7–7.7)
Neutrophils Relative %: 82 %
Platelets: 354 10*3/uL (ref 150–400)
RBC: 3.78 MIL/uL — ABNORMAL LOW (ref 3.87–5.11)
RDW: 16.6 % — ABNORMAL HIGH (ref 11.5–15.5)
WBC: 20.4 10*3/uL — ABNORMAL HIGH (ref 4.0–10.5)
nRBC: 0 % (ref 0.0–0.2)

## 2023-01-13 LAB — BASIC METABOLIC PANEL
Anion gap: 10 (ref 5–15)
BUN: 9 mg/dL (ref 8–23)
CO2: 24 mmol/L (ref 22–32)
Calcium: 8.3 mg/dL — ABNORMAL LOW (ref 8.9–10.3)
Chloride: 98 mmol/L (ref 98–111)
Creatinine, Ser: 0.84 mg/dL (ref 0.44–1.00)
GFR, Estimated: >60 mL/min (ref >60)
Glucose, Bld: 115 mg/dL — ABNORMAL HIGH (ref 70–99)
Potassium: 3.8 mmol/L (ref 3.5–5.1)
Sodium: 132 mmol/L — ABNORMAL LOW (ref 135–145)

## 2023-01-13 LAB — MAGNESIUM: Magnesium: 1.8 mg/dL (ref 1.7–2.4)

## 2023-01-13 MED ORDER — SENNA 8.6 MG PO TABS: 1.0000 | ORAL_TABLET | Freq: Every day | ORAL | Status: DC | PRN

## 2023-01-13 MED ORDER — POLYETHYLENE GLYCOL 3350 17 G PO PACK: 17.0000 g | PACK | Freq: Every day | ORAL | Status: DC | PRN

## 2023-01-13 MED ORDER — ORAL CARE MOUTH RINSE: 15.0000 mL | OROMUCOSAL | Status: DC | PRN

## 2023-01-13 NOTE — Progress Notes (Signed)
   01/13/23 1355  Vitals  Temp 98.5 F (36.9 C)  Temp Source Oral  BP 134/67  MAP (mmHg) 83  BP Location Left Arm  BP Method Automatic  Patient Position (if appropriate) Lying  Pulse Rate (!) 114  Pulse Rate Source Monitor  MEWS COLOR  MEWS Score Color Yellow  Oxygen Therapy  SpO2 98 %  O2 Device Room Air  MEWS Score  MEWS Temp 0  MEWS Systolic 0  MEWS Pulse 2  MEWS RR 0  MEWS LOC 0  MEWS Score 2   MD Sherryll Burger notified of yellow mews.

## 2023-01-13 NOTE — Progress Notes (Signed)
Patient states she does not want anything to make her have a bm right now.

## 2023-01-13 NOTE — Progress Notes (Signed)
PROGRESS NOTE    Laurie Wallace  ZOX:[id number] DOB: 08-03-1945 DOA: 01/09/2023 PCP: Anabel Halon, MD   Brief Narrative:  Laurie Wallace is a 77 y.o. female with medical history significant of rheumatoid arthritis who presented with severe  abdominal pain. nausea and nonbloody vomiting.   In the emergency department, she was intermittently tachypneic and tachycardic, but other vital signs were within normal range.  Workup in the ED showed WBC 20.5 with left shift, hemoglobin 10.7, platelets 516.  BMP showed potassium 2.8, lactic acid peaked at 5.9 and normalized after volume resuscitation.  7/6 CT angiography of abdomen and pelvis with mild apparent colonic wall thickening at the splenic flexure and proximal descending colon which may reflect mild infectious or inflammatory colitis. Patient was started on IV ciprofloxacin and metronidazole on 7/7, pain medication was given, potassium was replenished, Zofran was also given. Her diet has been slowly advanced during this admission but she continues to have nausea. This morning Laurie Wallace notes that she had some of her breakfast but was limited by nausea and some abdominal pain, both of which are improving with prns. She has not moved her bowels since Saturday morning.    Assessment & Plan:   Principal Problem:   Infectious colitis Active Problems:   Severe sepsis (HCC)   Abdominal pain   Nausea & vomiting   Hypokalemia   Hyperglycemia   Lactic acidosis   Thrombocytosis  Assessment and Plan:  Infectious colitis Presented with severe sepsis criteria due to leukocytosis, tachycardia, lactate and source of infection from 7/6 CT A/P c/w infectious colitis at splenic flexure and proximal descending colon. WBCs have slightly increased to 20k from 16k, Procal <.10. On Ciprofloxacin and Metronidazole (initiated 7/7), pain is overall much better. Advanced to regular diet but limited by some nausea this AM. Blood cultures  x2 with ngtd at 3 days. - Continue prn Zofran and Compazine - Continue pain control with Tylenol for mild, hasn't needed prn Dilaudid in two days so will discontinue - Continue Ciprofloxacin and Metronidazole (Initiated 7/7)  Hyponatremia Likely 2/2 poor po intake secondary to above infectious colitis. Sodium stable at 130 Would like to continue mIVF today with expectation for PO intake to continue improve. - Continue 19ml/hr mIVF NS - Boost TID between meals - Advance diet as toletrated  Hypokalemia K improved to 3.8 this morning after repletion yesterday. - Will continue to monitor  Seropositive Rheumatoid [employer] weekly 20mg  Methotrexate in setting of active infection.  Mild Constipation Has not moved bowels since Saturday AM, 7/6. - Adding Miralax and Senna prn   DVT prophylaxis: Lovenox Code Status: Full Family Communication: Will ask pt   Consultants:  None  Procedures:  None  Antimicrobials:  Initiated Ciprofloxacin and Metronidazole on 7/7   Subjective: Patient seen and evaluated today. She notes that she has not vomited overnight nor this morning. She began to eat her breakfast this AM but felt nauseous and asked for prn anti-nausea medication, and wasn't able to continue. Notes that her previous abdominal pain is much improved overall. Has still not moved her bowels since Saturday morning.  Objective: Vitals:   01/12/23 0413 01/12/23 1316 01/12/23 2238 01/13/23 0433  BP: 137/86 (!) 145/77 (!) 155/76 129/74  Pulse: (!) 109 91 95 (!) 106  Resp: 18 16 14 18   Temp: 98.2 F (36.8 C) 98.7 F (37.1 C) 99.1 F (37.3 C) 98.3 F (36.8 C)  TempSrc: Oral Oral  Oral  SpO2: 99% 100% 96% 100%  Weight:      Height:        Intake/Output Summary (Last 24 hours) at 01/13/2023 1117 Last data filed at 01/13/2023 1610 Gross per 24 hour  Intake 2373.37 ml  Output --  Net 2373.37 ml   Filed Weights   01/09/23 2007 01/10/23 1350  Weight: 56 kg 56.7 kg     Examination:  General exam: Appears calm and mildly uncomfortable  Respiratory system: Clear to auscultation. Respiratory effort normal. Cardiovascular system: S1 & S2 heard, RRR.  Gastrointestinal system: Abdomen is soft, tender over LLQ, bowel sounds present Central nervous system: Alert and awake Extremities: No edema Skin: No significant lesions noted Psychiatry: Responsive affect.    Data Reviewed: I have personally reviewed following labs and imaging studies  CBC: Recent Labs  Lab 01/09/23 2021 01/10/23 0610 01/11/23 0358 01/12/23 0400 01/13/23 0405  WBC 20.5* 18.0* 16.0* 16.0* 20.4*  NEUTROABS 18.8*  --  13.1* 13.6* 16.5*  HGB 10.7* 10.5* 11.2* 11.9* 11.4*  HCT 31.9* 30.6* 33.0* 34.4* 33.7*  MCV 90.4 89.5 89.7 88.0 89.2  PLT 516* 407* 308 325 354   Basic Metabolic Panel: Recent Labs  Lab 01/09/23 2021 01/10/23 0610 01/11/23 0358 01/12/23 0400 01/13/23 0405  NA 134* 132* 128* 132* 132*  K 2.8* 3.8 3.8 3.2* 3.8  CL 101 100 96* 99 98  CO2 20* 22 24 21* 24  GLUCOSE 300* 164* 109* 89 115*  BUN 12 8 5* 8 9  CREATININE 0.91 0.63 0.80 0.84 0.84  CALCIUM 8.6* 8.2* 8.6* 8.4* 8.3*  MG 1.8  --  2.0  --  1.8  PHOS  --  2.1*  --   --   --    GFR: Estimated Creatinine Clearance: 50.2 mL/min (by C-G formula based on SCr of 0.84 mg/dL). Liver Function Tests: Recent Labs  Lab 01/09/23 2021 01/10/23 0610  AST 19 14*  ALT 9 7  ALKPHOS 94 81  BILITOT 0.4 0.3  PROT 7.9 6.9  ALBUMIN 3.6 3.0*   Recent Labs  Lab 01/09/23 2021  LIPASE 25   No results for input(s): "AMMONIA" in the last 168 hours. Coagulation Profile: No results for input(s): "INR", "PROTIME" in the last 168 hours. Cardiac Enzymes: No results for input(s): "CKTOTAL", "CKMB", "CKMBINDEX", "TROPONINI" in the last 168 hours. BNP (last 3 results) No results for input(s): "PROBNP" in the last 8760 hours. HbA1C: No results for input(s): "HGBA1C" in the last 72 hours.  CBG: No results for  input(s): "GLUCAP" in the last 168 hours. Lipid Profile: No results for input(s): "CHOL", "HDL", "LDLCALC", "TRIG", "CHOLHDL", "LDLDIRECT" in the last 72 hours. Thyroid Function Tests: No results for input(s): "TSH", "T4TOTAL", "FREET4", "T3FREE", "THYROIDAB" in the last 72 hours. Anemia Panel: No results for input(s): "VITAMINB12", "FOLATE", "FERRITIN", "TIBC", "IRON", "RETICCTPCT" in the last 72 hours. Sepsis Labs: Recent Labs  Lab 01/10/23 0149 01/10/23 0430 01/10/23 0610 01/10/23 1030  PROCALCITON  --   --  <0.10  --   LATICACIDVEN 5.9* 4.3* 1.8 1.0    Recent Results (from the past 240 hour(s))  Blood culture (routine x 2)     Status: None (Preliminary result)   Collection Time: 01/10/23  1:49 AM   Specimen: Right Antecubital; Blood  Result Value Ref Range Status   Specimen Description RIGHT ANTECUBITAL  Final   Special Requests   Final    BOTTLES DRAWN AEROBIC AND ANAEROBIC Blood Culture adequate volume   Culture   Final    NO GROWTH 3 DAYS  Performed at [facility], [address], [place], [place] [address]    Report Status PENDING  Incomplete  Blood culture (routine x 2)     Status: None (Preliminary result)   Collection Time: 01/10/23  3:17 AM   Specimen: BLOOD LEFT HAND  Result Value Ref Range Status   Specimen Description BLOOD LEFT HAND  Final   Special Requests   Final    BOTTLES DRAWN AEROBIC AND ANAEROBIC Blood Culture adequate volume   Culture   Final    NO GROWTH 3 DAYS Performed at [facility], [address] [place], [place] [address]    Report Status PENDING  Incomplete         Radiology Studies: No results found.      Scheduled Meds:  enoxaparin (LOVENOX) injection  40 mg Subcutaneous Q24H   feeding supplement  1 Container Oral TID BM   folic acid  1 mg Oral Daily   loratadine  10 mg Oral q1800   pantoprazole  40 mg Oral QPM   saccharomyces boulardii  250 mg Oral BID   Continuous Infusions:  sodium chloride 50 mL/hr at 01/13/23  0444   ciprofloxacin Stopped (01/13/23 0441)   metronidazole Stopped (01/13/23 0329)     LOS: 3 days    Time spent: 35 minutes  Signed,  Marcelline Mates, MS4 Working with Dr. Maurilio Lovely

## 2023-01-14 LAB — CBC WITH DIFFERENTIAL/PLATELET
Abs Immature Granulocytes: 1.58 10*3/uL — ABNORMAL HIGH (ref 0.00–0.07)
Basophils Absolute: 0.1 10*3/uL (ref 0.0–0.1)
Basophils Relative: 0 %
Eosinophils Absolute: 0.1 10*3/uL (ref 0.0–0.5)
Eosinophils Relative: 1 %
HCT: 34.3 % — ABNORMAL LOW (ref 36.0–46.0)
Hemoglobin: 11.6 g/dL — ABNORMAL LOW (ref 12.0–15.0)
Immature Granulocytes: 6 %
Lymphocytes Relative: 6 %
MCH: 30.1 pg (ref 26.0–34.0)
MCHC: 33.8 g/dL (ref 30.0–36.0)
MCV: 89.1 fL (ref 80.0–100.0)
Metamyelocytes Relative: 1 %
Monocytes Relative: 2 %
Myelocytes: 2 %
Neutrophils Relative %: 85 %
Other: 2 %
Platelets: 394 10*3/uL (ref 150–400)
RBC: 3.85 MIL/uL — ABNORMAL LOW (ref 3.87–5.11)
RDW: 17 % — ABNORMAL HIGH (ref 11.5–15.5)
WBC: 27.6 10*3/uL — ABNORMAL HIGH (ref 4.0–10.5)
nRBC: 0 % (ref 0.0–0.2)

## 2023-01-14 LAB — BASIC METABOLIC PANEL
Anion gap: 9 (ref 5–15)
BUN: 6 mg/dL — ABNORMAL LOW (ref 8–23)
CO2: 26 mmol/L (ref 22–32)
Calcium: 8.1 mg/dL — ABNORMAL LOW (ref 8.9–10.3)
Chloride: 98 mmol/L (ref 98–111)
Creatinine, Ser: 0.89 mg/dL (ref 0.44–1.00)
GFR, Estimated: >60 mL/min (ref >60)
Glucose, Bld: 89 mg/dL (ref 70–99)
Potassium: 3.5 mmol/L (ref 3.5–5.1)
Sodium: 133 mmol/L — ABNORMAL LOW (ref 135–145)

## 2023-01-14 LAB — MAGNESIUM: Magnesium: 1.8 mg/dL (ref 1.7–2.4)

## 2023-01-14 MED ORDER — SACCHAROMYCES BOULARDII 250 MG PO CAPS: 250.0000 mg | ORAL_CAPSULE | Freq: Two times a day (BID) | ORAL | 0 refills | Status: AC

## 2023-01-14 MED ORDER — CIPROFLOXACIN HCL 500 MG PO TABS: 500.0000 mg | ORAL_TABLET | Freq: Two times a day (BID) | ORAL | 0 refills | Status: AC

## 2023-01-14 MED ORDER — METRONIDAZOLE 500 MG PO TABS: 500.0000 mg | ORAL_TABLET | Freq: Two times a day (BID) | ORAL | 0 refills | Status: AC

## 2023-01-14 NOTE — Care Management Important Message (Signed)
Important Message  Patient Details  Name: Laurie Wallace MRN: [id number] Date of Birth: 07/07/1945   Medicare Important Message Given:  Yes     Corey Harold 01/14/2023, 10:16 AM

## 2023-01-14 NOTE — Progress Notes (Signed)
Pt has not had any relief from tylenol for pain management and would like to get something else ordered on dayshift.

## 2023-01-14 NOTE — Discharge Summary (Signed)
Physician Discharge Summary  Laurie Wallace EAV:[id number] DOB: [birth year] DOA: 01/09/2023  PCP: Anabel Halon, MD  Admit date: 01/09/2023  Discharge date: 01/14/2023  Admitted From: Home  Disposition:    Recommendations for Outpatient Follow-up:  Follow up with PCP within the next 1 week Please obtain BMP/CBC in one week Please follow up on the following pending results: None at this time Take these two antibiotics for the next 6 days (7/12-7/17): Ciprofloxacin 500mg  twice a day for 6 days Metronidazole 500mg  twice a day for 6 days Take Florastor daily (has probiotics) We held your weekly Methotrexate during your admission because you had an active infection. Do not resume this medicine until you are told you may safely do so by your PCP or Rheumatologist.  Home Health: None  Equipment/Devices: None  Discharge Condition:Stable  CODE STATUS: Full  Diet recommendation: Heart Healthy  Brief/Interim Summary: Laurie Wallace is a 77 y.o. female with medical history significant of rheumatoid arthritis who presented with severe  abdominal pain, nausea, and nonbloody vomiting and was admitted for treatment of infectious colitis.   In the emergency department, she was intermittently tachypneic and tachycardic, but other vital signs were within normal range.  Workup in the ED showed WBC 20.5 with left shift, hemoglobin 10.7, platelets 516.  BMP showed potassium 2.8, lactic acid peaked at 5.9 and normalized after volume resuscitation.  7/6 CT angiography of abdomen and pelvis with mild apparent colonic wall thickening at the splenic flexure and proximal descending colon which may reflect mild infectious or inflammatory colitis.  Patient was started on IV ciprofloxacin and metronidazole on 7/7, pain medication was given, potassium was replenished, Zofran was also given. Her diet was slowly advanced during this admission while nausea slowly improved. By day of discharge she  had not moved her bowels since Saturday morning, had not wanted to take stool softeners but encouraged her to do so. By day of discharge she was very keen to return home with close outpatient follow up, and had returned to regular diet with well controlled nausea.   Discharge Diagnoses:  Principal Problem:   Infectious colitis Active Problems:   Severe sepsis (HCC)   Abdominal pain   Nausea & vomiting   Hypokalemia   Hyperglycemia   Lactic acidosis   Thrombocytosis    Discharge Instructions  Discharge Instructions     Diet - low sodium heart healthy   Complete by: As directed    Increase activity slowly   Complete by: As directed       Allergies as of 01/14/2023   No Known Allergies      Medication List     STOP taking these medications    methotrexate 2.5 MG tablet Commonly known as: RHEUMATREX       TAKE these medications    alendronate 70 MG tablet Commonly known as: FOSAMAX Take 1 tablet (70 mg total) by mouth every 7 (seven) days. Take with a full glass of water on an empty stomach. What changed:  when to take this additional instructions   ciprofloxacin 500 MG tablet Commonly known as: Cipro Take 1 tablet (500 mg total) by mouth 2 (two) times daily for 6 days.   folic acid 1 MG tablet Commonly known as: FOLVITE Take 1 tablet (1 mg total) by mouth daily.   levocetirizine 5 MG tablet Commonly known as: XYZAL Take 1 tablet (5 mg total) by mouth every evening.   metroNIDAZOLE 500 MG tablet Commonly known as: Flagyl Take 1  tablet (500 mg total) by mouth 2 (two) times daily for 6 days.   saccharomyces boulardii 250 MG capsule Commonly known as: FLORASTOR Take 1 capsule (250 mg total) by mouth 2 (two) times daily.        Follow-up Information     Anabel Halon, MD. Schedule an appointment as soon as possible for a visit in 1 week(s).   Specialty: Internal Medicine Contact information: [address] [place]  [address] [phone]                No Known Allergies  Consultations: None   Procedures/Studies: CT Angio Abd/Pel W and/or Wo Contrast  Result Date: 01/10/2023 CLINICAL DATA:  Mesenteric ischemia, acute.  Epigastric pain EXAM: CTA ABDOMEN AND PELVIS WITHOUT AND WITH CONTRAST TECHNIQUE: Multidetector CT imaging of the abdomen and pelvis was performed using the standard protocol during bolus administration of intravenous contrast. Multiplanar reconstructed images and MIPs were obtained and reviewed to evaluate the vascular anatomy. RADIATION DOSE REDUCTION: This exam was performed according to the departmental dose-optimization program which includes automated exposure control, adjustment of the mA and/or kV according to patient size and/or use of iterative reconstruction technique. CONTRAST:  75mL OMNIPAQUE IOHEXOL 350 MG/ML SOLN COMPARISON:  01/09/2023 FINDINGS: VASCULAR Aorta: Atherosclerotic irregularity with predominantly noncalcified plaque. No aneurysm or dissection. Celiac: Widely patent SMA: Widely patent Renals: 2 renal arteries bilaterally. Both right renal arteries are widely patent. The dominant superior left renal artery is widely patent. Small accessory renal artery to the left lower pole demonstrates mild narrowing at the origin. IMA: Widely patent Inflow: Patent without evidence of aneurysm, dissection, vasculitis or significant stenosis. Proximal Outflow: Bilateral common femoral and visualized portions of the superficial and profunda femoral arteries are patent without evidence of aneurysm, dissection, vasculitis or significant stenosis. Veins: No obvious venous abnormality within the limitations of this arterial phase study. Review of the MIP images confirms the above findings. NON-VASCULAR Lower chest: No acute abnormality Hepatobiliary: No focal hepatic abnormality. Gallbladder unremarkable. Pancreas: No focal abnormality or ductal dilatation. Spleen: No focal abnormality.   Normal size. Adrenals/Urinary Tract: Low-density enlargement of the adrenal glands bilaterally, likely hyperplasia. No renal mass or hydronephrosis. Urinary bladder unremarkable. Stomach/Bowel: While the colon is decompressed and difficult to evaluate, there is questionable wall thickening at the splenic flexure and proximal descending colon suggesting infectious or inflammatory colitis. Stomach and small bowel decompressed. No bowel obstruction. Lymphatic: No adenopathy Reproductive: Prior hysterectomy.  No adnexal masses. Other: No free fluid or free air. Musculoskeletal: No acute bony abnormality. IMPRESSION: VASCULAR No evidence of aortic aneurysm or dissection. Mesenteric vessels widely patent. No significant vascular abnormality to suggest mesenteric ischemia. NON-VASCULAR Mild apparent colonic wall thickening at the splenic flexure and proximal descending colon which may reflect mild infectious or inflammatory colitis. Electronically Signed   By: Charlett Nose M.D.   On: 01/10/2023 00:06   CT ABDOMEN PELVIS W CONTRAST  Result Date: 01/09/2023 CLINICAL DATA:  Abdominal pain and vomiting. EXAM: CT ABDOMEN AND PELVIS WITH CONTRAST TECHNIQUE: Multidetector CT imaging of the abdomen and pelvis was performed using the standard protocol following bolus administration of intravenous contrast. RADIATION DOSE REDUCTION: This exam was performed according to the departmental dose-optimization program which includes automated exposure control, adjustment of the mA and/or kV according to patient size and/or use of iterative reconstruction technique. CONTRAST:  80mL OMNIPAQUE IOHEXOL 300 MG/ML  SOLN COMPARISON:  None Available. FINDINGS: Lower chest: No acute abnormality. Hepatobiliary: A punctate focus of parenchymal low  attenuation is seen within the posterolateral aspect of the liver dome. No gallstones, gallbladder wall thickening, or biliary dilatation. Pancreas: Unremarkable. No pancreatic ductal dilatation or  surrounding inflammatory changes. Spleen: Normal in size without focal abnormality. Adrenals/Urinary Tract: There is mild, diffuse, bilateral adrenal gland enlargement. Kidneys are normal in size, without renal calculi or hydronephrosis. A subcentimeter simple cyst is seen within the anterior aspect of the mid left kidney. Bladder is unremarkable. Stomach/Bowel: Stomach is within normal limits. Appendix appears normal. No evidence of bowel dilatation. The splenic flexure is mildly thickened and poorly distended. Vascular/Lymphatic: No significant vascular findings are present. No enlarged abdominal or pelvic lymph nodes. Reproductive: Status post hysterectomy. No adnexal masses. Other: No abdominal wall hernia or abnormality. No abdominopelvic ascites. Musculoskeletal: Marked severity degenerative changes seen at the level of L5-S1. IMPRESSION: 1. No acute or active process within the abdomen or pelvis. 2. Subcentimeter simple left renal cyst. No follow-up imaging is recommended. This recommendation follows ACR consensus guidelines: Management of the Incidental Renal Mass on CT: A White Paper of the ACR Incidental Findings Committee. J Am Coll Radiol 2018;15:264-273. 3. Mild thickening of the splenic flexure which may be secondary to poor bowel distention. Mild colitis cannot be excluded. 4. Evidence of prior hysterectomy. 5. Marked severity degenerative changes at the level of L5-S1. Electronically Signed   By: Aram Candela M.D.   On: 01/09/2023 22:23     Discharge Exam: Vitals:   01/13/23 2325 01/14/23 0349  BP: 124/69 134/80  Pulse: (!) 101 (!) 107  Resp: 18 16  Temp: 98.2 F (36.8 C) 99.8 F (37.7 C)  SpO2: 99% 99%   Vitals:   01/13/23 1656 01/13/23 1957 01/13/23 2325 01/14/23 0349  BP: 135/64 138/67 124/69 134/80  Pulse: (!) 113 (!) 108 (!) 101 (!) 107  Resp:  20 18 16   Temp: 99.4 F (37.4 C) 99 F (37.2 C) 98.2 F (36.8 C) 99.8 F (37.7 C)  TempSrc: Oral Oral    SpO2: 96% 99% 99%  99%  Weight:      Height:        General: Pt is alert, awake, not in acute distress Cardiovascular: RRR, S1/S2 +, no rubs, no gallops Respiratory: CTA bilaterally, no wheezing, no rhonchi Abdominal: Soft, mildly tender over LLQ, ND, bowel sounds + Extremities: no edema, no cyanosis    The results of significant diagnostics from this hospitalization (including imaging, microbiology, ancillary and laboratory) are listed below for reference.     Microbiology: Recent Results (from the past 240 hour(s))  Blood culture (routine x 2)     Status: None (Preliminary result)   Collection Time: 01/10/23  1:49 AM   Specimen: Right Antecubital; Blood  Result Value Ref Range Status   Specimen Description RIGHT ANTECUBITAL  Final   Special Requests   Final    BOTTLES DRAWN AEROBIC AND ANAEROBIC Blood Culture adequate volume   Culture   Final    NO GROWTH 4 DAYS Performed at [facility], [address] [place], [place] [address]    Report Status PENDING  Incomplete  Blood culture (routine x 2)     Status: None (Preliminary result)   Collection Time: 01/10/23  3:17 AM   Specimen: BLOOD LEFT HAND  Result Value Ref Range Status   Specimen Description BLOOD LEFT HAND  Final   Special Requests   Final    BOTTLES DRAWN AEROBIC AND ANAEROBIC Blood Culture adequate volume   Culture   Final    NO  GROWTH 4 DAYS Performed at [facility], [address] [place], [place] [address]    Report Status PENDING  Incomplete     Labs: BNP (last 3 results) No results for input(s): "BNP" in the last 8760 hours. Basic Metabolic Panel: Recent Labs  Lab 01/09/23 2021 01/10/23 0610 01/11/23 0358 01/12/23 0400 01/13/23 0405 01/14/23 0403  NA 134* 132* 128* 132* 132* 133*  K 2.8* 3.8 3.8 3.2* 3.8 3.5  CL 101 100 96* 99 98 98  CO2 20* 22 24 21* 24 26  GLUCOSE 300* 164* 109* 89 115* 89  BUN 12 8 5* 8 9 6*  CREATININE 0.91 0.63 0.80 0.84 0.84 0.89  CALCIUM 8.6* 8.2* 8.6* 8.4* 8.3* 8.1*  MG 1.8  --   2.0  --  1.8 1.8  PHOS  --  2.1*  --   --   --   --    Liver Function Tests: Recent Labs  Lab 01/09/23 2021 01/10/23 0610  AST 19 14*  ALT 9 7  ALKPHOS 94 81  BILITOT 0.4 0.3  PROT 7.9 6.9  ALBUMIN 3.6 3.0*   Recent Labs  Lab 01/09/23 2021  LIPASE 25   No results for input(s): "AMMONIA" in the last 168 hours. CBC: Recent Labs  Lab 01/09/23 2021 01/10/23 0610 01/11/23 0358 01/12/23 0400 01/13/23 0405 01/14/23 0403  WBC 20.5* 18.0* 16.0* 16.0* 20.4* 27.6*  NEUTROABS 18.8*  --  13.1* 13.6* 16.5*  --   HGB 10.7* 10.5* 11.2* 11.9* 11.4* 11.6*  HCT 31.9* 30.6* 33.0* 34.4* 33.7* 34.3*  MCV 90.4 89.5 89.7 88.0 89.2 89.1  PLT 516* 407* 308 325 354 394   Cardiac Enzymes: No results for input(s): "CKTOTAL", "CKMB", "CKMBINDEX", "TROPONINI" in the last 168 hours. BNP: Invalid input(s): "POCBNP" CBG: No results for input(s): "GLUCAP" in the last 168 hours. D-Dimer No results for input(s): "DDIMER" in the last 72 hours. Hgb A1c No results for input(s): "HGBA1C" in the last 72 hours. Lipid Profile No results for input(s): "CHOL", "HDL", "LDLCALC", "TRIG", "CHOLHDL", "LDLDIRECT" in the last 72 hours. Thyroid function studies No results for input(s): "TSH", "T4TOTAL", "T3FREE", "THYROIDAB" in the last 72 hours.  Invalid input(s): "FREET3" Anemia work up No results for input(s): "VITAMINB12", "FOLATE", "FERRITIN", "TIBC", "IRON", "RETICCTPCT" in the last 72 hours. Urinalysis    Component Value Date/Time   COLORURINE YELLOW 01/09/2023 2132   APPEARANCEUR CLEAR 01/09/2023 2132   LABSPEC 1.016 01/09/2023 2132   PHURINE 6.0 01/09/2023 2132   GLUCOSEU >=500 (A) 01/09/2023 2132   HGBUR SMALL (A) 01/09/2023 2132   BILIRUBINUR NEGATIVE 01/09/2023 2132   KETONESUR 5 (A) 01/09/2023 2132   PROTEINUR 100 (A) 01/09/2023 2132   NITRITE NEGATIVE 01/09/2023 2132   LEUKOCYTESUR TRACE (A) 01/09/2023 2132   Sepsis Labs Recent Labs  Lab 01/11/23 0358 01/12/23 0400 01/13/23 0405  01/14/23 0403  WBC 16.0* 16.0* 20.4* 27.6*   Microbiology Recent Results (from the past 240 hour(s))  Blood culture (routine x 2)     Status: None (Preliminary result)   Collection Time: 01/10/23  1:49 AM   Specimen: Right Antecubital; Blood  Result Value Ref Range Status   Specimen Description RIGHT ANTECUBITAL  Final   Special Requests   Final    BOTTLES DRAWN AEROBIC AND ANAEROBIC Blood Culture adequate volume   Culture   Final    NO GROWTH 4 DAYS Performed at [facility], [address], [place], [place] [address]    Report Status PENDING  Incomplete  Blood  culture (routine x 2)     Status: None (Preliminary result)   Collection Time: 01/10/23  3:17 AM   Specimen: BLOOD LEFT HAND  Result Value Ref Range Status   Specimen Description BLOOD LEFT HAND  Final   Special Requests   Final    BOTTLES DRAWN AEROBIC AND ANAEROBIC Blood Culture adequate volume   Culture   Final    NO GROWTH 4 DAYS Performed at [facility], [address], [place], [place] [address]    Report Status PENDING  Incomplete     Time coordinating discharge: 35 minutes  SIGNED:  Marcelline Mates, MS4 Working with Dr. Maurilio Lovely

## 2023-01-15 ENCOUNTER — Telehealth: Payer: Self-pay

## 2023-01-15 LAB — CULTURE, BLOOD (ROUTINE X 2)
Culture: NO GROWTH
Culture: NO GROWTH
Special Requests: ADEQUATE
Special Requests: ADEQUATE

## 2023-01-15 NOTE — Transitions of Care (Post Inpatient/ED Visit) (Signed)
   01/15/2023  Name: Rachiel Seratt MRN: [id number] DOB: 08-18-1945  Today's TOC FU Call Status: Today's TOC FU Call Status:: Unsuccessul Call (1st Attempt) Unsuccessful Call (1st Attempt) Date: 01/15/23  Attempted to reach the patient regarding the most recent Inpatient/ED visit.  Follow Up Plan: Additional outreach attempts will be made to reach the patient to complete the Transitions of Care (Post Inpatient/ED visit) call.   Jodelle Gross, RN, BSN, CCM Care Management Coordinator [place] Phone: [phone]/Fax: [phone]

## 2023-01-18 ENCOUNTER — Telehealth: Payer: Self-pay

## 2023-01-18 ENCOUNTER — Ambulatory Visit (INDEPENDENT_AMBULATORY_CARE_PROVIDER_SITE_OTHER): Payer: Medicare HMO | Admitting: Internal Medicine

## 2023-01-18 ENCOUNTER — Encounter: Payer: Self-pay | Admitting: Internal Medicine

## 2023-01-18 VITALS — BP 107/68 | HR 95 | Ht 66.0 in

## 2023-01-18 DIAGNOSIS — K219 Gastro-esophageal reflux disease without esophagitis: Secondary | ICD-10-CM

## 2023-01-18 DIAGNOSIS — E876 Hypokalemia: Secondary | ICD-10-CM | POA: Diagnosis not present

## 2023-01-18 DIAGNOSIS — M059 Rheumatoid arthritis with rheumatoid factor, unspecified: Secondary | ICD-10-CM

## 2023-01-18 DIAGNOSIS — R269 Unspecified abnormalities of gait and mobility: Secondary | ICD-10-CM | POA: Diagnosis not present

## 2023-01-18 DIAGNOSIS — A09 Infectious gastroenteritis and colitis, unspecified: Secondary | ICD-10-CM | POA: Diagnosis not present

## 2023-01-18 DIAGNOSIS — Z09 Encounter for follow-up examination after completed treatment for conditions other than malignant neoplasm: Secondary | ICD-10-CM | POA: Diagnosis not present

## 2023-01-18 DIAGNOSIS — R519 Headache, unspecified: Secondary | ICD-10-CM | POA: Diagnosis not present

## 2023-01-18 MED ORDER — ONDANSETRON HCL 4 MG PO TABS: 4.0000 mg | ORAL_TABLET | Freq: Three times a day (TID) | ORAL | 0 refills | Status: DC | PRN

## 2023-01-18 MED ORDER — FAMOTIDINE 40 MG PO TABS: 40.0000 mg | ORAL_TABLET | Freq: Every day | ORAL | 1 refills | Status: DC

## 2023-01-18 NOTE — Assessment & Plan Note (Signed)
On oral ciprofloxacin and metronidazole Zofran as needed for nausea Needs to at regular intervals and maintain adequate hydration

## 2023-01-18 NOTE — Assessment & Plan Note (Addendum)
Resolved during hospitalization Check CMP

## 2023-01-18 NOTE — Assessment & Plan Note (Signed)
Could be due to lack of sleep and adjustment due to recent hospitalization Advised to take Tylenol as needed

## 2023-01-18 NOTE — Assessment & Plan Note (Signed)
Her gastric pain is likely due to gastritis/GERD due to poor p.o. intake Needs to eat at regular intervals Started Pepcid 40 mg QD as needed

## 2023-01-18 NOTE — Transitions of Care (Post Inpatient/ED Visit) (Signed)
   01/18/2023  Name: Kelcie Currie MRN: [id number] DOB: 1945/11/17  Today's TOC FU Call Status: Today's TOC FU Call Status:: Unsuccessful Call (2nd Attempt) Unsuccessful Call (2nd Attempt) Date: 01/18/23  Attempted to reach the patient regarding the most recent Inpatient/ED visit.  Follow Up Plan: Additional outreach attempts will be made to reach the patient to complete the Transitions of Care (Post Inpatient/ED visit) call.   Jodelle Gross, RN, BSN, CCM Care Management Coordinator [place] Phone: [phone]/Fax: [phone]

## 2023-01-18 NOTE — Progress Notes (Addendum)
Established Patient Office Visit  Subjective:  Patient ID: Laurie Wallace, female    DOB: 1945/11/21  Age: 77 y.o. MRN: [id number]  CC:  Chief Complaint  Patient presents with   Abdominal Pain    Patient is having abdominal pain and stays nauseous    Headache    Patient states she has off and on headaches   Hospitalization Follow-up    HPI Laurie Wallace is a 77 y.o. female with past medical history of RA and osteoporosis who presents for follow-up after recent hospitalization from 01/09/23-01/14/23.  She was admitted with severe sepsis due to acute colitis.  She was given IV ciprofloxacin and metronidazole with IV fluids.  She was also found to have hypokalemia and was given potassium supplement.  Her diet was slowly upgraded to regular diet.  Since being discharged, she still has mild epigastric pain and nausea, but denies any vomiting, diarrhea, melena or hematochezia.  She admits that she has not been eating at regular intervals.  Denies any fever or chills.  She also reports intermittent generalized headache, but denies any visual disturbance.  Past Medical History:  Diagnosis Date   Allergy    Arthritis    COPD (chronic obstructive pulmonary disease) (HCC)     Past Surgical History:  Procedure Laterality Date   ABDOMINAL HYSTERECTOMY     TOE SURGERY Right    1st toe    Family History  Problem Relation Age of Onset   Cancer Mother    Cancer Father    Heart failure Sister    COPD Sister    Dementia Sister     Social History   Socioeconomic History   Marital status: Single    Spouse name: Not on file   Number of children: Not on file   Years of education: Not on file   Highest education level: Not on file  Occupational History   Occupation: full time  Tobacco Use   Smoking status: Never    Passive exposure: Never   Smokeless tobacco: Never  Vaping Use   Vaping status: Never Used  Substance and Sexual Activity   Alcohol use: No    Drug use: No   Sexual activity: Not Currently  Other Topics Concern   Not on file  Social History Narrative   Lives with her son   Right Handed   Drinks 1-2 cups caffeine daily in winter   Social Determinants of Health   Financial Resource Strain: Low Risk  (05/07/2021)   Overall Financial Resource Strain (CARDIA)    Difficulty of Paying Living Expenses: Not hard at all  Food Insecurity: No Food Insecurity (01/10/2023)   Hunger Vital Sign    Worried About Running Out of Food in the Last Year: Never true    Ran Out of Food in the Last Year: Never true  Transportation Needs: No Transportation Needs (01/10/2023)   PRAPARE - Administrator, Civil Service (Medical): No    Lack of Transportation (Non-Medical): No  Physical Activity: Sufficiently Active (05/07/2021)   Exercise Vital Sign    Days of Exercise per Week: 5 days    Minutes of Exercise per Session: 30 min  Stress: No Stress Concern Present (05/07/2021)   [employer] of Occupational Health - Occupational Stress Questionnaire    Feeling of Stress : Not at all  Social Connections: Moderately Isolated (05/07/2021)   Social Connection and Isolation Panel [NHANES]    Frequency of Communication with Friends and Family: More  than three times a week    Frequency of Social Gatherings with Friends and Family: Three times a week    Attends Religious Services: More than 4 times per year    Active Member of Clubs or Organizations: No    Attends Banker Meetings: Never    Marital Status: Widowed  Intimate Partner Violence: Not At Risk (05/07/2021)   Humiliation, Afraid, Rape, and Kick questionnaire    Fear of Current or Ex-Partner: No    Emotionally Abused: No    Physically Abused: No    Sexually Abused: No    Outpatient Medications Prior to Visit  Medication Sig Dispense Refill   alendronate (FOSAMAX) 70 MG tablet Take 1 tablet (70 mg total) by mouth every 7 (seven) days. Take with a full glass of water on an  empty stomach. (Patient taking differently: Take 70 mg by mouth every Monday.) 4 tablet 11   ciprofloxacin (CIPRO) 500 MG tablet Take 1 tablet (500 mg total) by mouth 2 (two) times daily for 6 days. 12 tablet 0   folic acid (FOLVITE) 1 MG tablet Take 1 tablet (1 mg total) by mouth daily. 90 tablet 3   levocetirizine (XYZAL) 5 MG tablet Take 1 tablet (5 mg total) by mouth every evening. 30 tablet 5   metroNIDAZOLE (FLAGYL) 500 MG tablet Take 1 tablet (500 mg total) by mouth 2 (two) times daily for 6 days. 12 tablet 0   saccharomyces boulardii (FLORASTOR) 250 MG capsule Take 1 capsule (250 mg total) by mouth 2 (two) times daily. 60 capsule 0   No facility-administered medications prior to visit.    No Known Allergies  ROS Review of Systems  Constitutional:  Negative for chills and fever.  HENT:  Negative for congestion, sinus pressure, sinus pain and sore throat.   Eyes:  Negative for pain and discharge.  Respiratory:  Negative for cough and shortness of breath.   Cardiovascular:  Negative for chest pain and palpitations.  Gastrointestinal:  Positive for abdominal pain and nausea. Negative for constipation, diarrhea and vomiting.  Endocrine: Negative for polydipsia and polyuria.  Genitourinary:  Negative for dysuria and hematuria.  Musculoskeletal:  Positive for arthralgias, back pain and myalgias. Negative for neck pain and neck stiffness.  Skin:  Negative for rash.  Neurological:  Positive for headaches. Negative for weakness.  Psychiatric/Behavioral:  Negative for agitation and behavioral problems.       Objective:    Physical Exam Vitals reviewed.  Constitutional:      General: She is not in acute distress.    Appearance: She is not diaphoretic.     Comments: In wheelchair  HENT:     Head: Normocephalic and atraumatic.     Nose: Nose normal.     Mouth/Throat:     Mouth: Mucous membranes are moist.  Eyes:     General: No scleral icterus.    Extraocular Movements:  Extraocular movements intact.  Cardiovascular:     Rate and Rhythm: Normal rate and regular rhythm.     Pulses: Normal pulses.     Heart sounds: Normal heart sounds. No murmur heard. Pulmonary:     Breath sounds: Normal breath sounds. No wheezing or rales.  Abdominal:     Palpations: Abdomen is soft.     Tenderness: There is abdominal tenderness (Mild, epigastric).  Musculoskeletal:        General: Deformity (Small joints of b/l hands with mild ulnar deviation) present.     Cervical back: Neck supple.  No tenderness.     Right lower leg: No edema.     Left lower leg: No edema.  Skin:    General: Skin is warm.     Findings: No rash.  Neurological:     General: No focal deficit present.     Mental Status: She is alert and oriented to person, place, and time.     Cranial Nerves: No cranial nerve deficit.     Sensory: No sensory deficit.     Motor: Weakness (B/l LE- 3/5) present.  Psychiatric:        Mood and Affect: Mood normal.        Behavior: Behavior normal.     BP 107/68 (BP Location: Left Arm, Patient Position: Sitting, Cuff Size: Normal)   Pulse 95   Ht 5\' 6"  (1.676 m)   SpO2 98%   BMI 20.18 kg/m  Wt Readings from Last 3 Encounters:  01/10/23 125 lb (56.7 kg)  12/22/22 124 lb (56.2 kg)  10/28/22 122 lb (55.3 kg)    Lab Results  Component Value Date   TSH 1.950 10/17/2020   Lab Results  Component Value Date   WBC 27.6 (H) 01/14/2023   HGB 11.6 (L) 01/14/2023   HCT 34.3 (L) 01/14/2023   MCV 89.1 01/14/2023   PLT 394 01/14/2023   Lab Results  Component Value Date   NA 133 (L) 01/14/2023   K 3.5 01/14/2023   CO2 26 01/14/2023   GLUCOSE 89 01/14/2023   BUN 6 (L) 01/14/2023   CREATININE 0.89 01/14/2023   BILITOT 0.3 01/10/2023   ALKPHOS 81 01/10/2023   AST 14 (L) 01/10/2023   ALT 7 01/10/2023   PROT 6.9 01/10/2023   ALBUMIN 3.0 (L) 01/10/2023   CALCIUM 8.1 (L) 01/14/2023   ANIONGAP 9 01/14/2023   EGFR 85 12/22/2022   No results found for:  "CHOL" No results found for: "HDL" No results found for: "LDLCALC" No results found for: "TRIG" No results found for: "CHOLHDL" Lab Results  Component Value Date   HGBA1C 5.8 (H) 01/10/2023      Assessment & Plan:   Problem List Items Addressed This Visit       Digestive   Infectious colitis - Primary    On oral ciprofloxacin and metronidazole Zofran as needed for nausea Needs to at regular intervals and maintain adequate hydration      Relevant Medications   ondansetron (ZOFRAN) 4 MG tablet   Other Relevant Orders   CBC with Differential/Platelet   CMP14+EGFR   Gastroesophageal reflux disease    Her gastric pain is likely due to gastritis/GERD due to poor p.o. intake Needs to eat at regular intervals Started Pepcid 40 mg QD as needed      Relevant Medications   ondansetron (ZOFRAN) 4 MG tablet   famotidine (PEPCID) 40 MG tablet     Musculoskeletal and Integument   Seropositive rheumatoid arthritis (HCC)    Followed by Rheumatology - last note reviewed Symptoms were better with Methotrexate, but was held due to recent acute colitis Continue folic acid        Other   Hypokalemia    Resolved during hospitalization Check [facility]      Hospital discharge follow-up    Hospital chart reviewed, including discharge summary Medications reconciled and reviewed with the patient in detail      Gait disturbance    She has physical deconditioning and gait disturbance She was sitting in wheelchair today, has bilateral LE weakness Would benefit  from walker to avoid fall and for better mobility -rolling walker prescription sent      Relevant Orders   Walker rolling   Nonintractable episodic headache    Could be due to lack of sleep and adjustment due to recent hospitalization Advised to take Tylenol as needed       Meds ordered this encounter  Medications   ondansetron (ZOFRAN) 4 MG tablet    Sig: Take 1 tablet (4 mg total) by mouth every 8 (eight) hours as needed  for nausea or vomiting.    Dispense:  20 tablet    Refill:  0   famotidine (PEPCID) 40 MG tablet    Sig: Take 1 tablet (40 mg total) by mouth daily.    Dispense:  30 tablet    Refill:  1    Follow-up: Return in about 4 weeks (around 02/15/2023).    Anabel Halon, MD

## 2023-01-18 NOTE — Patient Instructions (Addendum)
Please start taking Pepcid once daily for 2 weeks and then as needed for acid reflux/abdominal pain.  Please take Ondansetron/Zofran as needed for nausea.  Please eat at regular intervals and maintain at least 64 ounces of fluid intake.  Please get blood tests done in the next week.

## 2023-01-18 NOTE — Assessment & Plan Note (Signed)
She has physical deconditioning and gait disturbance She was sitting in wheelchair today, has bilateral LE weakness Would benefit from walker to avoid fall and for better mobility -rolling walker prescription sent

## 2023-01-18 NOTE — Assessment & Plan Note (Signed)
Hospital chart reviewed, including discharge summary Medications reconciled and reviewed with the patient in detail 

## 2023-01-18 NOTE — Assessment & Plan Note (Signed)
Followed by Rheumatology - last note reviewed Symptoms were better with Methotrexate, but was held due to recent acute colitis Continue folic acid

## 2023-01-19 ENCOUNTER — Telehealth: Payer: Self-pay

## 2023-01-19 NOTE — Transitions of Care (Post Inpatient/ED Visit) (Signed)
   01/19/2023  Name: Laurie Wallace MRN: [id number] DOB: 29-Sep-1945  Today's TOC FU Call Status: Today's TOC FU Call Status:: Unsuccessful Call (3rd Attempt) Unsuccessful Call (3rd Attempt) Date: 01/19/23  Attempted to reach the patient regarding the most recent Inpatient/ED visit.  Follow Up Plan: Additional outreach attempts will be made to reach the patient to complete the Transitions of Care (Post Inpatient/ED visit) call.   Jodelle Gross, RN, BSN, CCM Care Management Coordinator [facility] Phone: [phone]/Fax: [phone]

## 2023-01-29 DIAGNOSIS — A09 Infectious gastroenteritis and colitis, unspecified: Secondary | ICD-10-CM | POA: Diagnosis not present

## 2023-01-29 IMAGING — MG MM DIGITAL SCREENING BILAT W/ TOMO AND CAD
8 series · 9 of 24 positions shown · non-contrast
Comparison: None.

CLINICAL DATA: Screening.

EXAM:
DIGITAL SCREENING BILATERAL MAMMOGRAM WITH TOMOSYNTHESIS AND CAD
TECHNIQUE: Bilateral screening digital craniocaudal and mediolateral oblique
mammograms were obtained. Bilateral screening digital breast
tomosynthesis was performed. The images were evaluated with
computer-aided detection.

[L MLO synth-2D]
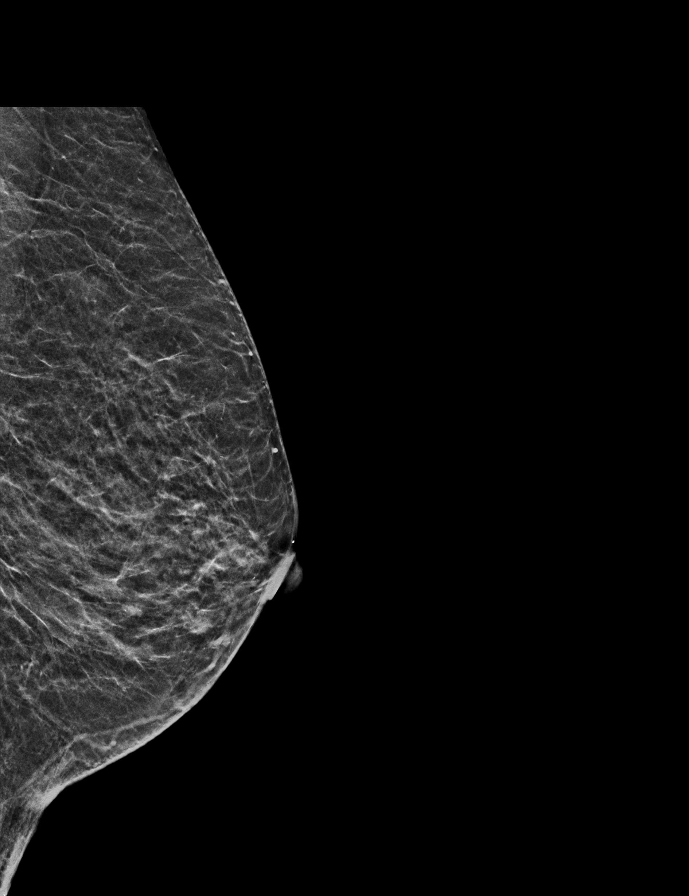

[L CC synth-2D]
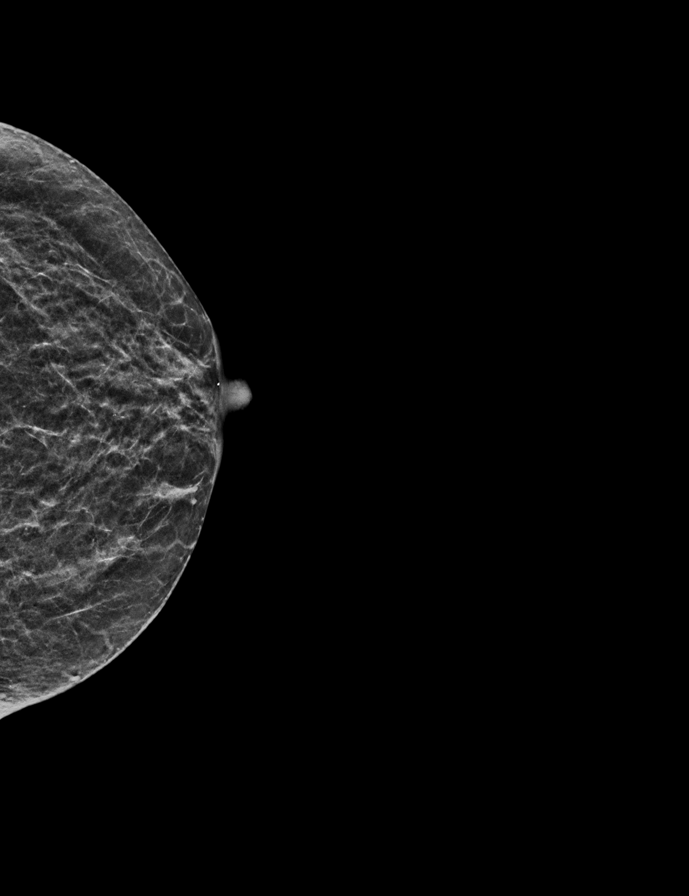

[R CC synth-2D]
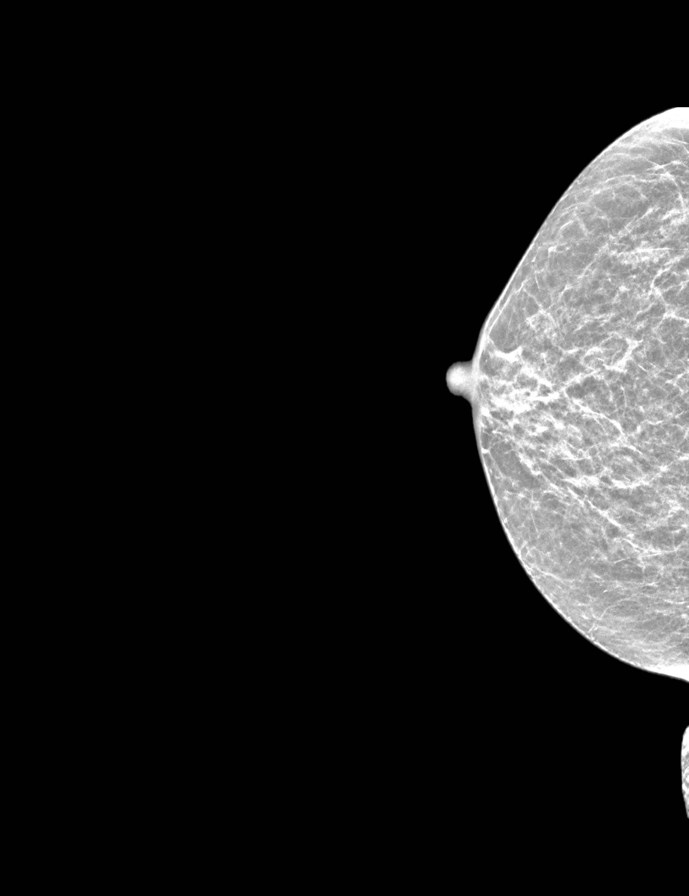

[R MLO synth-2D]
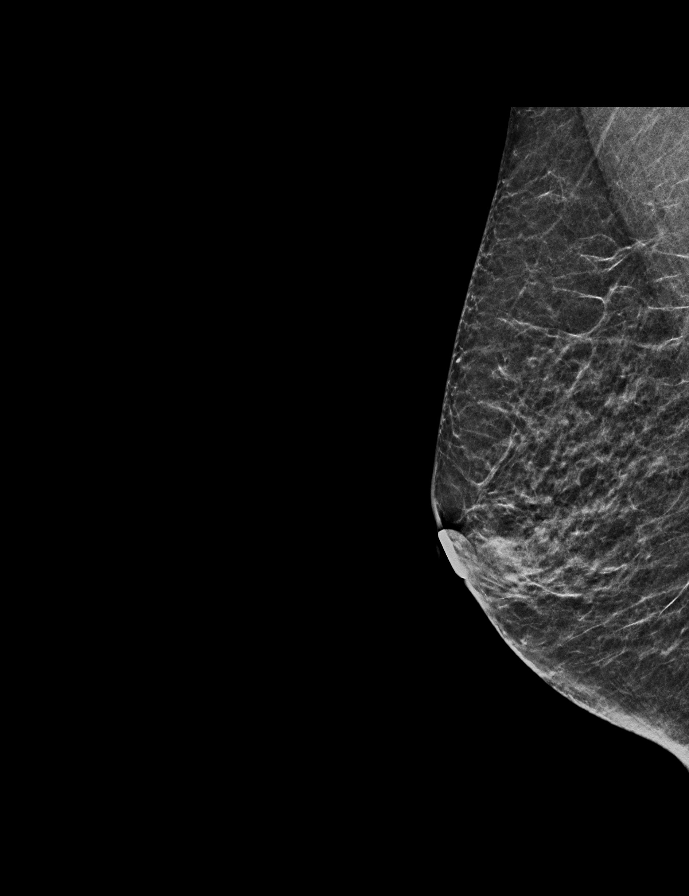

[L MLO tomo · 2 of 38 frames shown]
[frame 13/38]
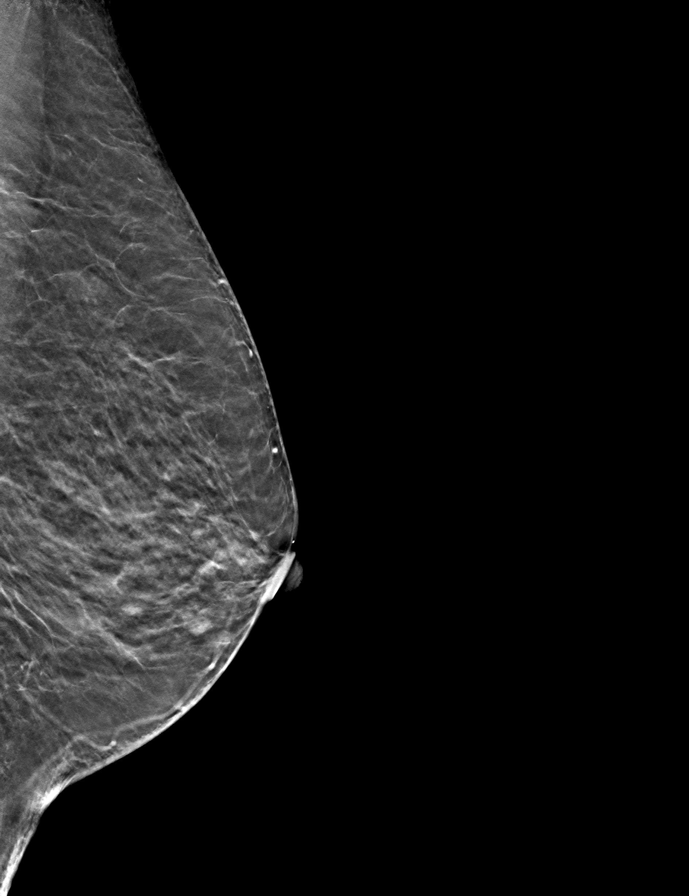
[frame 19/38]
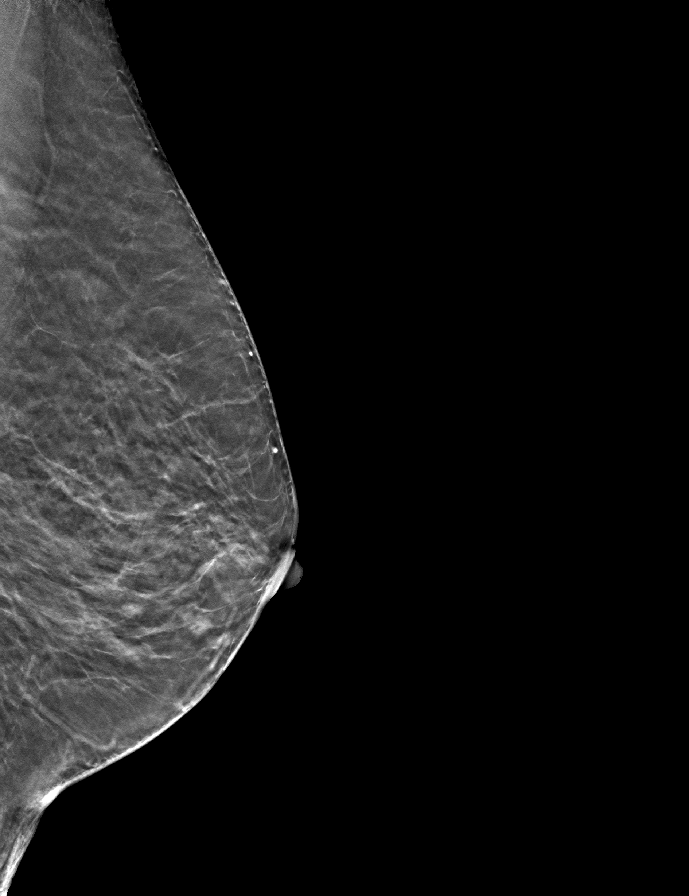

[R CC tomo · tomo slice 19/37.0]
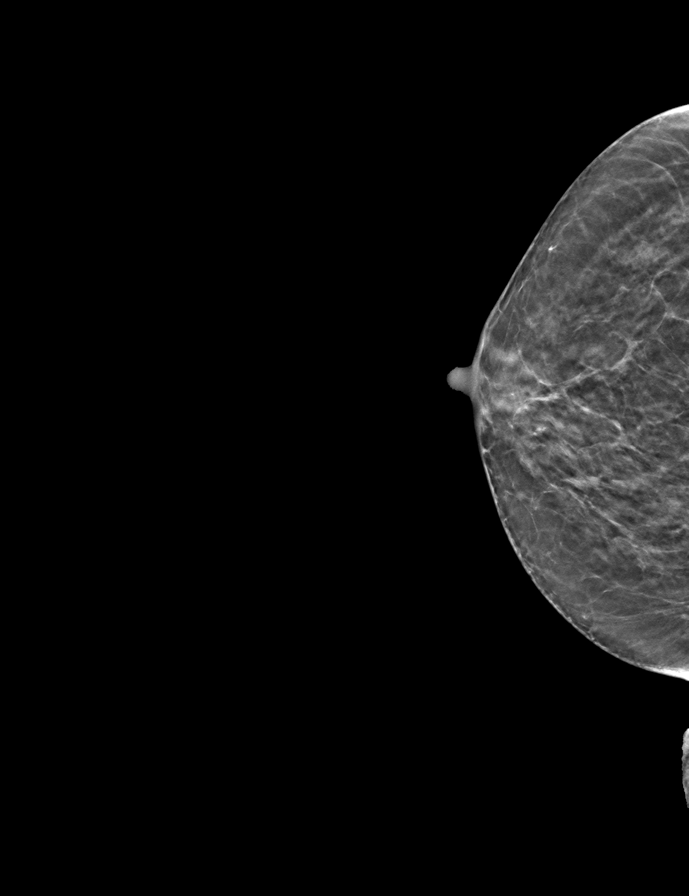

[L CC tomo · tomo slice 19/38.0]
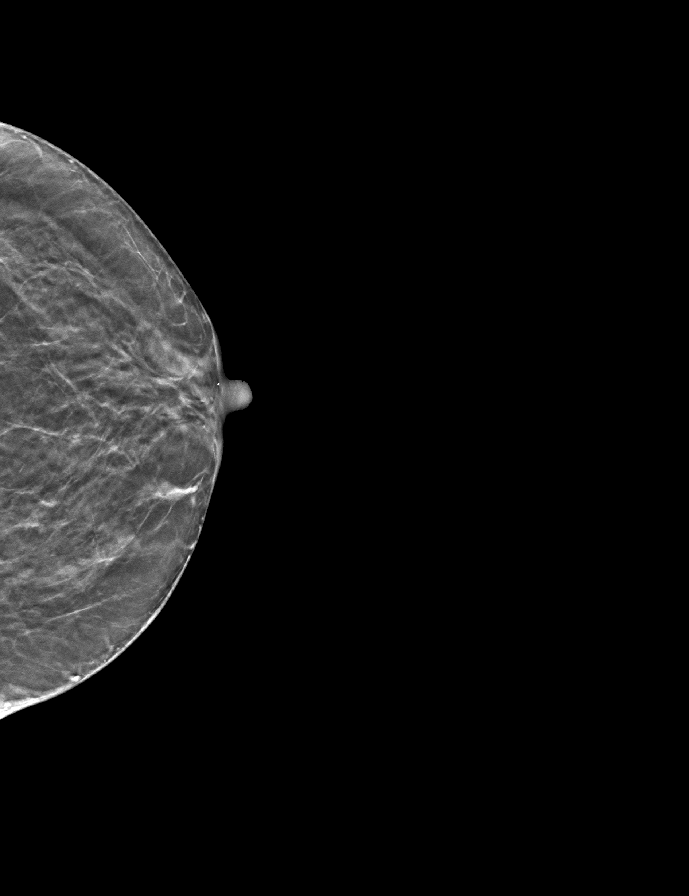

[R MLO tomo · tomo slice 19/37.0]
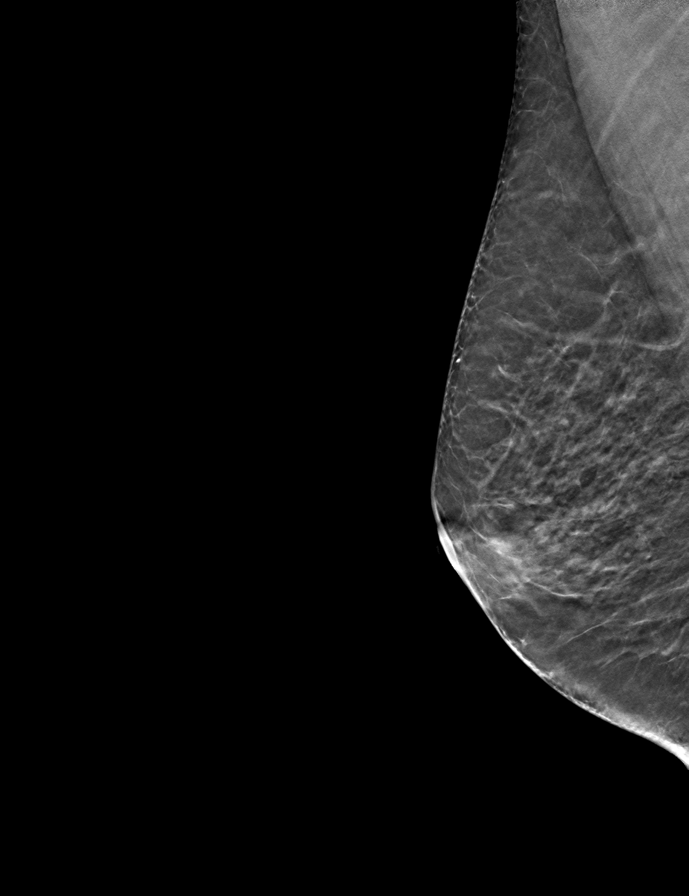

[9 of 24 positions shown; findings below may reference images not displayed]

ACR Breast Density Category b: There are scattered areas of
fibroglandular density.
FINDINGS: In the left breast, a possible asymmetry warrants further
evaluation. In the right breast, no findings suspicious for
malignancy.
IMPRESSION: Further evaluation is suggested for possible asymmetry in the left
breast.

RECOMMENDATION:
Diagnostic mammogram and possibly ultrasound of the left breast.
(Code:[id number])

The patient will be contacted regarding the findings, and additional
imaging will be scheduled.

BI-RADS CATEGORY  0: Incomplete. Need additional imaging evaluation
and/or prior mammograms for comparison.

## 2023-02-04 ENCOUNTER — Telehealth: Payer: Self-pay | Admitting: Internal Medicine

## 2023-02-04 NOTE — Telephone Encounter (Signed)
Patient is calling said she was told by Dr Allena Katz to call us back regarding her medication that she is taking to see whether she needs to keep taking it or not. Patient does not know the name of medication. Please advise, thank you

## 2023-02-05 ENCOUNTER — Telehealth: Payer: Self-pay | Admitting: Internal Medicine

## 2023-02-05 DIAGNOSIS — M059 Rheumatoid arthritis with rheumatoid factor, unspecified: Secondary | ICD-10-CM

## 2023-02-05 NOTE — Telephone Encounter (Signed)
Patient called stating when she was admitted to the hospital on 01/09/23 they stopped her Methotrexate and Folic Acid.  Patient states she was told to contact Dr. Dimple Casey to find out if she can restart the medications.

## 2023-02-05 NOTE — Telephone Encounter (Signed)
Patient was admitted to the hospital on 01/09/2023. Please advise.

## 2023-02-05 NOTE — Telephone Encounter (Signed)
Attempted to contact the patient and left a message to call the office back. 

## 2023-02-05 NOTE — Telephone Encounter (Signed)
Spoke to patient , she is questioning a medication her rheumatologist prescribes, I told her to contact that office

## 2023-02-05 NOTE — Telephone Encounter (Signed)
She appears to have completed all antibiotics since more than a week ago. She had normal kidney and liver function tests rechecked on July 26th. She should be fine to resume the methotrexate can restart at 15 mg (6 tablets) weekly.

## 2023-02-08 NOTE — Telephone Encounter (Signed)
Patient advised She appears to have completed all antibiotics since more than a week ago. She had normal kidney and liver function tests rechecked on July 26th. She should be fine to resume the methotrexate can restart at 15 mg (6 tablets) weekly. Patient verbalized understanding.

## 2023-02-17 ENCOUNTER — Encounter: Payer: Self-pay | Admitting: Internal Medicine

## 2023-02-17 ENCOUNTER — Ambulatory Visit (INDEPENDENT_AMBULATORY_CARE_PROVIDER_SITE_OTHER): Payer: Medicare HMO | Admitting: Internal Medicine

## 2023-02-17 VITALS — BP 130/70 | HR 98 | Ht 66.0 in | Wt 114.6 lb

## 2023-02-17 DIAGNOSIS — M810 Age-related osteoporosis without current pathological fracture: Secondary | ICD-10-CM | POA: Diagnosis not present

## 2023-02-17 DIAGNOSIS — D509 Iron deficiency anemia, unspecified: Secondary | ICD-10-CM | POA: Insufficient documentation

## 2023-02-17 DIAGNOSIS — M059 Rheumatoid arthritis with rheumatoid factor, unspecified: Secondary | ICD-10-CM

## 2023-02-17 MED ORDER — IRON (FERROUS SULFATE) 325 (65 FE) MG PO TABS
325.0000 mg | ORAL_TABLET | Freq: Every day | ORAL | 5 refills | Status: DC
Start: 2023-02-17 — End: 2024-04-11

## 2023-02-17 MED ORDER — PREDNISONE 20 MG PO TABS: 20.0000 mg | ORAL_TABLET | Freq: Every day | ORAL | 0 refills | Status: DC

## 2023-02-17 NOTE — Assessment & Plan Note (Signed)
Likely has iron deficiency in addition to anemia of chronic disease Needs to start iron supplement Continue folic acid

## 2023-02-17 NOTE — Progress Notes (Signed)
Established Patient Office Visit  Subjective:  Patient ID: Laurie Wallace, female    DOB: 01/03/1946  Age: 77 y.o. MRN: [id number]  CC:  Chief Complaint  Patient presents with   Back Pain    Patient is still having back pain     HPI Laurie Wallace is a 77 y.o. female with past medical history of RA and osteoporosis who presents for f/u of her chronic medical conditions.  RA: She reports acute on chronic low back pain since 02/10/23.  Pain is constant, dull, worse with movement and is nonradiating.  Of note, she has not started taking methotrexate yet since her episode of infectious colitis in 07/24.  She was confused about her new dose.  She agrees to start taking methotrexate 15 mg qw now.  She takes alendronate for osteoporosis.  She has history of anemia of chronic disease.  She takes folic acid supplement currently, but has not started taking iron supplement yet.  Denies any signs of active bleeding.  Past Medical History:  Diagnosis Date   Allergy    Arthritis    COPD (chronic obstructive pulmonary disease) (HCC)     Past Surgical History:  Procedure Laterality Date   ABDOMINAL HYSTERECTOMY     TOE SURGERY Right    1st toe    Family History  Problem Relation Age of Onset   Cancer Mother    Cancer Father    Heart failure Sister    COPD Sister    Dementia Sister     Social History   Socioeconomic History   Marital status: Single    Spouse name: Not on file   Number of children: Not on file   Years of education: Not on file   Highest education level: Not on file  Occupational History   Occupation: full time  Tobacco Use   Smoking status: Never    Passive exposure: Never   Smokeless tobacco: Never  Vaping Use   Vaping status: Never Used  Substance and Sexual Activity   Alcohol use: No   Drug use: No   Sexual activity: Not Currently  Other Topics Concern   Not on file  Social History Narrative   Lives with her son   Right  Handed   Drinks 1-2 cups caffeine daily in winter   Social Determinants of Health   Financial Resource Strain: Low Risk  (05/07/2021)   Overall Financial Resource Strain (CARDIA)    Difficulty of Paying Living Expenses: Not hard at all  Food Insecurity: No Food Insecurity (01/10/2023)   Hunger Vital Sign    Worried About Running Out of Food in the Last Year: Never true    Ran Out of Food in the Last Year: Never true  Transportation Needs: No Transportation Needs (01/10/2023)   PRAPARE - Administrator, Civil Service (Medical): No    Lack of Transportation (Non-Medical): No  Physical Activity: Sufficiently Active (05/07/2021)   Exercise Vital Sign    Days of Exercise per Week: 5 days    Minutes of Exercise per Session: 30 min  Stress: No Stress Concern Present (05/07/2021)   [employer] of Occupational Health - Occupational Stress Questionnaire    Feeling of Stress : Not at all  Social Connections: Moderately Isolated (05/07/2021)   Social Connection and Isolation Panel [NHANES]    Frequency of Communication with Friends and Family: More than three times a week    Frequency of Social Gatherings with Friends and Family: Three  times a week    Attends Religious Services: More than 4 times per year    Active Member of Clubs or Organizations: No    Attends Banker Meetings: Never    Marital Status: Widowed  Intimate Partner Violence: Not At Risk (05/07/2021)   Humiliation, Afraid, Rape, and Kick questionnaire    Fear of Current or Ex-Partner: No    Emotionally Abused: No    Physically Abused: No    Sexually Abused: No    Outpatient Medications Prior to Visit  Medication Sig Dispense Refill   methotrexate (RHEUMATREX) 2.5 MG tablet Take 15 mg by mouth once a week. Caution:Chemotherapy. Protect from light.     alendronate (FOSAMAX) 70 MG tablet Take 1 tablet (70 mg total) by mouth every 7 (seven) days. Take with a full glass of water on an empty stomach.  (Patient taking differently: Take 70 mg by mouth every Monday.) 4 tablet 11   famotidine (PEPCID) 40 MG tablet Take 1 tablet (40 mg total) by mouth daily. 30 tablet 1   folic acid (FOLVITE) 1 MG tablet Take 1 tablet (1 mg total) by mouth daily. 90 tablet 3   levocetirizine (XYZAL) 5 MG tablet Take 1 tablet (5 mg total) by mouth every evening. 30 tablet 5   ondansetron (ZOFRAN) 4 MG tablet Take 1 tablet (4 mg total) by mouth every 8 (eight) hours as needed for nausea or vomiting. 20 tablet 0   No facility-administered medications prior to visit.    No Known Allergies  ROS Review of Systems  Constitutional:  Negative for chills and fever.  HENT:  Negative for congestion, sinus pressure, sinus pain and sore throat.   Eyes:  Negative for pain and discharge.  Respiratory:  Negative for cough and shortness of breath.   Cardiovascular:  Negative for chest pain and palpitations.  Gastrointestinal:  Negative for constipation, diarrhea, nausea and vomiting.  Endocrine: Negative for polydipsia and polyuria.  Genitourinary:  Negative for dysuria and hematuria.  Musculoskeletal:  Positive for arthralgias, back pain and myalgias. Negative for neck pain and neck stiffness.  Skin:  Negative for rash.  Neurological:  Negative for dizziness and weakness.  Psychiatric/Behavioral:  Negative for agitation and behavioral problems.       Objective:    Physical Exam Vitals reviewed.  Constitutional:      General: She is not in acute distress.    Appearance: She is not diaphoretic.  HENT:     Head: Normocephalic and atraumatic.     Nose: Nose normal.     Mouth/Throat:     Mouth: Mucous membranes are moist.  Eyes:     General: No scleral icterus.    Extraocular Movements: Extraocular movements intact.  Cardiovascular:     Rate and Rhythm: Normal rate and regular rhythm.     Pulses: Normal pulses.     Heart sounds: Normal heart sounds. No murmur heard. Pulmonary:     Breath sounds: Normal breath  sounds. No wheezing or rales.  Abdominal:     Palpations: Abdomen is soft.     Tenderness: There is no abdominal tenderness.  Musculoskeletal:        General: Deformity (Small joints of b/l hands with mild ulnar deviation) present.     Cervical back: Neck supple. No tenderness.     Lumbar back: Tenderness present. Negative right straight leg raise test and negative left straight leg raise test.     Right lower leg: No edema.  Left lower leg: No edema.  Skin:    General: Skin is warm.     Findings: No rash.  Neurological:     General: No focal deficit present.     Mental Status: She is alert and oriented to person, place, and time.     Cranial Nerves: No cranial nerve deficit.     Sensory: No sensory deficit.     Motor: No weakness.  Psychiatric:        Mood and Affect: Mood normal.        Behavior: Behavior normal.     BP 130/70 (BP Location: Right Arm, Patient Position: Sitting, Cuff Size: Normal)   Pulse 98   Ht 5\' 6"  (1.676 m)   Wt 114 lb 9.6 oz (52 kg)   SpO2 97%   BMI 18.50 kg/m  Wt Readings from Last 3 Encounters:  02/17/23 114 lb 9.6 oz (52 kg)  01/10/23 125 lb (56.7 kg)  12/22/22 124 lb (56.2 kg)    Lab Results  Component Value Date   TSH 1.950 10/17/2020   Lab Results  Component Value Date   WBC 9.3 01/29/2023   HGB 10.8 (L) 01/29/2023   HCT 33.0 (L) 01/29/2023   MCV 90 01/29/2023   PLT 311 01/29/2023   Lab Results  Component Value Date   NA 142 01/29/2023   K 4.8 01/29/2023   CO2 23 01/29/2023   GLUCOSE 82 01/29/2023   BUN 8 01/29/2023   CREATININE 0.84 01/29/2023   BILITOT 0.3 01/29/2023   ALKPHOS 113 01/29/2023   AST 13 01/29/2023   ALT 5 01/29/2023   PROT 7.0 01/29/2023   ALBUMIN 3.8 01/29/2023   CALCIUM 9.3 01/29/2023   ANIONGAP 9 01/14/2023   EGFR 72 01/29/2023   No results found for: "CHOL" No results found for: "HDL" No results found for: "LDLCALC" No results found for: "TRIG" No results found for: "CHOLHDL" Lab Results   Component Value Date   HGBA1C 5.8 (H) 01/10/2023      Assessment & Plan:   Problem List Items Addressed This Visit       Musculoskeletal and Integument   Seropositive rheumatoid arthritis (HCC) - Primary    Followed by Rheumatology - last note reviewed Symptoms were better with Methotrexate, but was held due to recent acute colitis -needs to restart at 15 mg qw dose Continue folic acid Her back pain is likely due to RA in addition to DDD of lumbar spine, prescribed prednisone 20 mg X 5 days      Relevant Medications   methotrexate (RHEUMATREX) 2.5 MG tablet   predniSONE (DELTASONE) 20 MG tablet   Age-related osteoporosis without current pathological fracture    DEXA scan reviewed Was placed on alendronate, she needs to get refills from pharmacy. Continue Caltrate 600 + D3        Other   Microcytic anemia    Likely has iron deficiency in addition to anemia of chronic disease Needs to start iron supplement Continue folic acid      Relevant Medications   Iron, Ferrous Sulfate, 325 (65 Fe) MG TABS    Meds ordered this encounter  Medications   predniSONE (DELTASONE) 20 MG tablet    Sig: Take 1 tablet (20 mg total) by mouth daily with breakfast.    Dispense:  5 tablet    Refill:  0   Iron, Ferrous Sulfate, 325 (65 Fe) MG TABS    Sig: Take 325 mg by mouth daily.    Dispense:  30 tablet    Refill:  5    Follow-up: Return in about 6 months (around 08/20/2023) for Annual physical.    Anabel Halon, MD

## 2023-02-17 NOTE — Patient Instructions (Signed)
Please start taking Prednisone 20 mg as prescribed.  Please start taking Methotrexate 6 tablets once weekly.  Please start taking Ferrous sulfate 325 mg once daily.

## 2023-02-17 NOTE — Assessment & Plan Note (Addendum)
Followed by Rheumatology - last note reviewed Symptoms were better with Methotrexate, but was held due to recent acute colitis -needs to restart at 15 mg qw dose Continue folic acid Her back pain is likely due to RA in addition to DDD of lumbar spine, prescribed prednisone 20 mg X 5 days

## 2023-02-17 NOTE — Assessment & Plan Note (Signed)
DEXA scan reviewed Was placed on alendronate, she needs to get refills from pharmacy. Continue Caltrate 600 + D3 

## 2023-03-11 NOTE — Progress Notes (Signed)
Office Visit Note  Patient: Laurie Wallace             Date of Birth: [birth year]           MRN: [id number]             PCP: Anabel Halon, MD Referring: Anabel Halon, MD Visit Date: 03/24/2023   Subjective:  Follow-up (Doing good)   History of Present Illness: Laurie Wallace is a 77 y.o. female here for follow up for seropositive RA on methotrexate 15 mg p.o. weekly and folic acid 1 mg daily.  At our last visit had discussed increasing medication due to increased symptoms especially in upper extremities and elevated sedimentation rate.  However she was sick with colitis with severe diarrhea weakness hypokalemia and interrupted treatment for this.  She resumed her methotrexate at 15 mg p.o. weekly after follow-up with PCP currently feels like her arthritis is doing well.  GI symptoms are mostly resolved she still has reduced appetite has been unable to regain any her unintentional weight loss.  Previous HPI 12/22/2022 Laurie Wallace is a 77 y.o. female here for follow up for seropositive RA on methotrexate 20 mg p.o. weekly and folic acid 1 mg daily.  Overall she feels symptoms are doing pretty well though still describes some pain in both shoulders and stiffness in her hands in the mornings.  Some pain around the back of her neck and shoulders often more bothersome at night.  No flareup of joint swelling has not required any additional steroid treatments.  She denies any interval significant infections or antibiotics treatment.   Previous HPI 09/21/22 Laurie Wallace is a 77 y.o. female here for follow up for seropositive RA on methotrexate 20 mg p.o. weekly and folic acid 1 mg daily.  Symptoms were doing pretty well with some ongoing left shoulder pain around baseline until acutely developed pain and swelling on the back of her right forearm and wrist at the end of December.  Did not recall any preceding illness no preceding injury to the site or  change in usual activities.  Concern was for cellulitis versus RA flareup and treated with course of oral doxycycline and oral prednisone medication for 5 days.  She felt symptoms almost entirely cleared up after the first 2 days of treatment.  There is still been a small persistent swelling or nodule on the affected area ever since. Occasionally notices tenderness at the site but no pain with use.   Previous HPI 06/22/22 Laurie Wallace is a 77 y.o. female here for follow up for seropositive RA on MTX 20 mg PO weekly and folic acid 1 mg daily. She is doing well recently, slightly less busy with work so less lifting and shoulder pain is improved. Right upper arm still with some pain and stiffness. Not seeing any swelling and hand and knee pain is all doing well. Notices small increase in symptoms with the recent cooler weather usually just with the rain.   Previous HPI 02/26/22 Laurie Wallace is a 77 y.o. female here for follow up seropositive RA.  She has been off of the p.o. methotrexate for about a month.  She experienced increased joint pain in shoulders and knees off the medication.  She took a Medrol Dosepak which improved joint symptoms pretty quickly.   Previous HPI 10/13/2021 Laurie Wallace is a 77 y.o. female here for follow up for seropositive RA on methotrexate 20 mg PO weekly. She is  doing well since last visit her shoulder pain increases on days with poor weather or if she uses her arms a lot more than usual. Otherwise pain free on some days. She had COVID infection last month treated with paxlovid and tessalon and tylenol with good improvement of symptoms.    Previous HPI 07/10/21 Laurie Wallace is a 77 y.o. female here for follow up for seropositive RA on methotrexate 20 mg PO weekly which was increased after last visit.  She feels symptoms are overall improving she has 0 pain on some days occasionally swelling and stiffness affecting the left  arm.  Left shoulder range of motion remains slightly reduced but with much less pain.  She denies any new side effects on increased methotrexate dose.  She has regained about 5 pounds since having some unintentional weight loss last year denies any GI complaints reports normal appetite.   Previous HPI   12/11/20 Laurie Wallace is a 77 y.o. female here for evaluation of joint pains in multiple areas with elevated inflammatory markers.  She believes these symptoms in her shoulder started since about 4 5 months ago without any specific preceding injury or event that she can recall.  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  She does not recall any past significant shoulder injuries or surgeries. Previous treatments tried include cymbalta that she does not notice a significant improvement.  Since last month she was started on oral daily prednisone for the past few weeks and has noticed a significant improvement in her joint symptoms particularly resolution of the hand swelling although does continue having some left shoulder pain.   LAbs reviewed 10/2020 ANA neg ACE neg RF neg ANCAs neg ESR 68 CRP 25 Alk phos 126 Plts 495 CK 55   Review of Systems  Constitutional:  Positive for fatigue.  HENT:  Negative for mouth sores and mouth dryness.   Eyes:  Negative for dryness.  Respiratory:  Positive for shortness of breath.   Cardiovascular:  Negative for chest pain and palpitations.  Gastrointestinal:  Positive for constipation. Negative for blood in stool and diarrhea.  Endocrine: Negative for increased urination.  Genitourinary:  Negative for involuntary urination.  Musculoskeletal:  Positive for joint pain, gait problem, joint pain and morning stiffness. Negative for joint swelling, myalgias, muscle weakness, muscle tenderness and myalgias.  Skin:  Negative for color change, rash, hair loss  and sensitivity to sunlight.  Allergic/Immunologic: Negative for susceptible to infections.  Neurological:  Negative for dizziness and headaches.  Hematological:  Negative for swollen glands.  Psychiatric/Behavioral:  Positive for sleep disturbance. Negative for depressed mood. The patient is not nervous/anxious.     PMFS History:  Patient Active Problem List   Diagnosis Date Noted   Microcytic anemia 02/17/2023   Hospital discharge follow-up 01/18/2023   Gastroesophageal reflux disease 01/18/2023   Gait disturbance 01/18/2023   Nonintractable episodic headache 01/18/2023   Infectious colitis 01/10/2023   Hypokalemia 01/10/2023   Thrombocytosis 01/10/2023   Bilateral shoulder pain 12/22/2022   Localized swelling of right forearm 09/21/2022   Subcutaneous cyst 07/20/2022   Allergic sinusitis 01/14/2022   Encounter for general adult medical examination with abnormal findings 07/17/2021   Age-related osteoporosis without current pathological fracture 07/17/2021   Muscle cramps 02/28/2021   High risk medication use 12/26/2020   Seropositive rheumatoid arthritis (HCC) 10/25/2020   Moderate protein-calorie malnutrition (HCC) 10/25/2020   Cervical  spine pain 10/23/2020   Lumbar pain 10/23/2020   Other intervertebral disc displacement, lumbar region 04/07/2010    Past Medical History:  Diagnosis Date   Allergy    Arthritis    COPD (chronic obstructive pulmonary disease) (HCC)     Family History  Problem Relation Age of Onset   Cancer Mother    Cancer Father    Heart failure Sister    COPD Sister    Dementia Sister    Past Surgical History:  Procedure Laterality Date   ABDOMINAL HYSTERECTOMY     TOE SURGERY Right    1st toe   Social History   Social History Narrative   Lives with her son   Right Handed   Drinks 1-2 cups caffeine daily in winter   Immunization History  Administered Date(s) Administered   PNEUMOCOCCAL CONJUGATE-20 02/28/2021     Objective: Vital  Signs: BP 120/68 (BP Location: Left Arm, Patient Position: Sitting, Cuff Size: Normal)   Pulse 96   Resp 15   Ht 5\' 6"  (1.676 m)   Wt 113 lb (51.3 kg)   BMI 18.24 kg/m    Physical Exam Eyes:     Conjunctiva/sclera: Conjunctivae normal.  Cardiovascular:     Rate and Rhythm: Normal rate and regular rhythm.  Pulmonary:     Effort: Pulmonary effort is normal.     Breath sounds: Normal breath sounds.  Musculoskeletal:     Right lower leg: No edema.     Left lower leg: No edema.  Lymphadenopathy:     Cervical: No cervical adenopathy.  Skin:    General: Skin is warm and dry.     Findings: No rash.  Neurological:     Mental Status: She is alert.  Psychiatric:        Mood and Affect: Mood normal.      Musculoskeletal Exam:  Shoulders full ROM no tenderness or swelling Elbows full ROM no tenderness or swelling Wrists full ROM no tenderness or swelling Fingers full ROM no tenderness or swelling Knees full ROM no tenderness or swelling Ankles full ROM no tenderness or swelling   Investigation: No additional findings.  Imaging: No results found.  Recent Labs: Lab Results  Component Value Date   WBC 9.3 01/29/2023   HGB 10.8 (L) 01/29/2023   PLT 311 01/29/2023   NA 142 01/29/2023   K 4.8 01/29/2023   CL 104 01/29/2023   CO2 23 01/29/2023   GLUCOSE 82 01/29/2023   BUN 8 01/29/2023   CREATININE 0.84 01/29/2023   BILITOT 0.3 01/29/2023   ALKPHOS 113 01/29/2023   AST 13 01/29/2023   ALT 5 01/29/2023   PROT 7.0 01/29/2023   ALBUMIN 3.8 01/29/2023   CALCIUM 9.3 01/29/2023   GFRAA 88 09/28/2014    Speciality Comments: No specialty comments available.  Procedures:  No procedures performed Allergies: Patient has no known allergies.   Assessment / Plan:     Visit Diagnoses: Seropositive rheumatoid arthritis (HCC) - Plan: Sedimentation rate  Joint inflammation currently appears improved no peripheral synovitis appreciable on exam. Will recheck sedimentation rate  for disease activity monitoring.  Especially with recent illness and unintentional weight loss we will hold off on trying to titrate any treatment dose.  Plan to continue methotrexate 15 mg p.o. weekly and folic acid 1 mg daily.  High risk medication use - methotrexate 20 mg p.o. weekly folic acid 1 mg daily. - Plan: CBC with Differential/Platelet, COMPLETE METABOLIC PANEL WITH GFR  Checking CBC and  CMP for medication monitoring now back on methotrexate.  Had the episode of colitis but has not experienced any other new infections.  This is not commonly associated side effect for methotrexate treatment if she keeps having persistent GI symptoms might benefit to try switching to subcutaneous administration.  Chronic pain of both shoulders  The previous shoulder pain and range of motion currently doing better as well.   Might be due to decreased use recently.  No additional treatment or as needed medications at this time.  Orders: Orders Placed This Encounter  Procedures   Sedimentation rate   CBC with Differential/Platelet   COMPLETE METABOLIC PANEL WITH GFR   Meds ordered this encounter  Medications   methotrexate (RHEUMATREX) 2.5 MG tablet    Sig: Take 6 tablets (15 mg total) by mouth once a week. Caution:Chemotherapy. Protect from light.    Dispense:  72 tablet    Refill:  0     Follow-Up Instructions: Return in about 3 months (around 06/23/2023) for RA on MTX f/u 3mos.   Fuller Plan, MD  Note - This record has been created using [employer].  Chart creation errors have been sought, but may not always  have been located. Such creation errors do not reflect on  the standard of medical care.

## 2023-03-24 ENCOUNTER — Encounter: Payer: Self-pay | Admitting: Internal Medicine

## 2023-03-24 ENCOUNTER — Ambulatory Visit: Payer: Medicare HMO | Attending: Internal Medicine | Admitting: Internal Medicine

## 2023-03-24 VITALS — BP 120/68 | HR 96 | Resp 15 | Ht 66.0 in | Wt 113.0 lb

## 2023-03-24 DIAGNOSIS — M25511 Pain in right shoulder: Secondary | ICD-10-CM | POA: Diagnosis not present

## 2023-03-24 DIAGNOSIS — G8929 Other chronic pain: Secondary | ICD-10-CM

## 2023-03-24 DIAGNOSIS — M25512 Pain in left shoulder: Secondary | ICD-10-CM

## 2023-03-24 DIAGNOSIS — M059 Rheumatoid arthritis with rheumatoid factor, unspecified: Secondary | ICD-10-CM

## 2023-03-24 DIAGNOSIS — Z79899 Other long term (current) drug therapy: Secondary | ICD-10-CM | POA: Diagnosis not present

## 2023-03-24 MED ORDER — METHOTREXATE SODIUM 2.5 MG PO TABS
15.0000 mg | ORAL_TABLET | ORAL | 0 refills | Status: DC
Start: 2023-03-24 — End: 2023-06-24

## 2023-03-25 LAB — CBC WITH DIFFERENTIAL/PLATELET
Absolute Monocytes: 1318 cells/uL — ABNORMAL HIGH (ref 200–950)
Basophils Absolute: 64 cells/uL (ref 0–200)
Basophils Relative: 0.5 %
Eosinophils Absolute: 166 cells/uL (ref 15–500)
Eosinophils Relative: 1.3 %
HCT: 34.4 % — ABNORMAL LOW (ref 35.0–45.0)
Hemoglobin: 11.1 g/dL — ABNORMAL LOW (ref 11.7–15.5)
Lymphs Abs: 2355 cells/uL (ref 850–3900)
MCH: 29.2 pg (ref 27.0–33.0)
MCHC: 32.3 g/dL (ref 32.0–36.0)
MCV: 90.5 fL (ref 80.0–100.0)
MPV: 11.2 fL (ref 7.5–12.5)
Monocytes Relative: 10.3 %
Neutro Abs: 8896 cells/uL — ABNORMAL HIGH (ref 1500–7800)
Neutrophils Relative %: 69.5 %
Platelets: 330 10*3/uL (ref 140–400)
RBC: 3.8 10*6/uL (ref 3.80–5.10)
RDW: 16.8 % — ABNORMAL HIGH (ref 11.0–15.0)
Total Lymphocyte: 18.4 %
WBC: 12.8 10*3/uL — ABNORMAL HIGH (ref 3.8–10.8)

## 2023-03-25 LAB — SEDIMENTATION RATE: Sed Rate: 117 mm/h — ABNORMAL HIGH (ref 0–30)

## 2023-03-25 LAB — COMPLETE METABOLIC PANEL WITH GFR
AG Ratio: 1 (calc) (ref 1.0–2.5)
ALT: 6 U/L (ref 6–29)
AST: 14 U/L (ref 10–35)
Albumin: 3.7 g/dL (ref 3.6–5.1)
Alkaline phosphatase (APISO): 106 U/L (ref 37–153)
BUN: 15 mg/dL (ref 7–25)
CO2: 25 mmol/L (ref 20–32)
Calcium: 9.4 mg/dL (ref 8.6–10.4)
Chloride: 102 mmol/L (ref 98–110)
Creat: 0.74 mg/dL (ref 0.60–1.00)
Globulin: 3.7 g/dL (calc) (ref 1.9–3.7)
Glucose, Bld: 86 mg/dL (ref 65–99)
Potassium: 4.7 mmol/L (ref 3.5–5.3)
Sodium: 139 mmol/L (ref 135–146)
Total Bilirubin: 0.3 mg/dL (ref 0.2–1.2)
Total Protein: 7.4 g/dL (ref 6.1–8.1)
eGFR: 83 mL/min/{1.73_m2} (ref >=60)

## 2023-03-25 NOTE — Progress Notes (Signed)
Sedimentation rate is highly elevated at 117.  White blood cell count is slightly high at 12.8.  These indicate active inflammation.  Kidney and liver function are normal. I recommend continuing the methotrexate at this time.  I am not sure about the large increase in lab results despite not that much joint inflammation on our exam.  She should let us know if there is a flareup of arthritis symptoms.

## 2023-04-13 ENCOUNTER — Other Ambulatory Visit: Payer: Self-pay | Admitting: Internal Medicine

## 2023-04-13 DIAGNOSIS — J309 Allergic rhinitis, unspecified: Secondary | ICD-10-CM

## 2023-04-29 ENCOUNTER — Ambulatory Visit: Payer: Medicare HMO | Admitting: Family Medicine

## 2023-04-29 ENCOUNTER — Encounter: Payer: Self-pay | Admitting: Family Medicine

## 2023-04-29 VITALS — BP 125/57 | HR 79 | Ht 66.0 in | Wt 115.0 lb

## 2023-04-29 DIAGNOSIS — J309 Allergic rhinitis, unspecified: Secondary | ICD-10-CM | POA: Diagnosis not present

## 2023-04-29 DIAGNOSIS — J069 Acute upper respiratory infection, unspecified: Secondary | ICD-10-CM | POA: Diagnosis not present

## 2023-04-29 MED ORDER — PREDNISONE 20 MG PO TABS
20.0000 mg | ORAL_TABLET | Freq: Two times a day (BID) | ORAL | 0 refills | Status: AC
Start: 2023-04-29 — End: 2023-05-04

## 2023-04-29 MED ORDER — AZITHROMYCIN 250 MG PO TABS
ORAL_TABLET | ORAL | 0 refills | Status: DC
Start: 2023-04-29 — End: 2023-06-02

## 2023-04-29 MED ORDER — LEVOCETIRIZINE DIHYDROCHLORIDE 5 MG PO TABS
5.0000 mg | ORAL_TABLET | Freq: Every evening | ORAL | 5 refills | Status: DC
Start: 2023-04-29 — End: 2023-10-13

## 2023-04-29 NOTE — Progress Notes (Signed)
Established Patient Office Visit   Subjective  Patient ID: Laurie Wallace, female    DOB: [birth year]  Age: 77 y.o. MRN: [id number]  Chief Complaint  Patient presents with   Nausea    Starting Tuesday throwing up, pain across chest shoulder ribs back, sinus issues; nose running, not able to cough up phlem * patel pt      She  has a past medical history of Allergy, Arthritis, and COPD (chronic obstructive pulmonary disease) (HCC).  The patient presents with concerns of chest congestion and cough, reporting symptoms of cough, runny nose, vomiting, fatigue, headache, malaise, muscle aches, nausea, and wheezing. These symptoms started 9 days ago and have progressively worsened. The patient denies experiencing shortness of breath or chest pain. Current treatment includes over-the-counter analgesics and antipyretics, which have provided minimal relief. Past pulmonary history with no prior occurrences of pneumonia or bronchitis.    Review of Systems  Constitutional:  Positive for malaise/fatigue. Negative for chills and fever.  Respiratory:  Positive for cough, shortness of breath and wheezing. Negative for sputum production.   Cardiovascular:  Negative for chest pain.  Gastrointestinal:  Positive for nausea and vomiting.  Genitourinary:  Negative for dysuria.  Musculoskeletal:  Positive for myalgias.      Objective:     BP (!) 125/57   Pulse 79   Ht 5\' 6"  (1.676 m)   Wt 115 lb (52.2 kg)   SpO2 98%   BMI 18.56 kg/m  BP Readings from Last 3 Encounters:  04/29/23 (!) 125/57  03/24/23 120/68  02/17/23 130/70      Physical Exam Vitals reviewed.  Constitutional:      General: She is not in acute distress.    Appearance: Normal appearance. She is not ill-appearing, toxic-appearing or diaphoretic.  HENT:     Head: Normocephalic.     Nose: Congestion and rhinorrhea present.     Mouth/Throat:     Pharynx: Posterior oropharyngeal erythema present.  Eyes:      General:        Right eye: No discharge.        Left eye: No discharge.     Conjunctiva/sclera: Conjunctivae normal.  Cardiovascular:     Rate and Rhythm: Normal rate.     Pulses: Normal pulses.     Heart sounds: Normal heart sounds.  Pulmonary:     Effort: No respiratory distress.     Breath sounds: Rhonchi present.  Abdominal:     General: Bowel sounds are normal.     Palpations: Abdomen is soft.     Tenderness: There is no abdominal tenderness. There is no right CVA tenderness, left CVA tenderness or guarding.  Musculoskeletal:     Cervical back: Normal range of motion.  Skin:    General: Skin is warm and dry.     Capillary Refill: Capillary refill takes less than 2 seconds.  Neurological:     Mental Status: She is alert.     Coordination: Coordination normal.     Gait: Gait normal.  Psychiatric:        Mood and Affect: Mood normal.        Behavior: Behavior normal.      No results found for any visits on 04/29/23.  The ASCVD Risk score (Arnett DK, et al., 2019) failed to calculate for the following reasons:   Cannot find a previous HDL lab   Cannot find a previous total cholesterol lab    Assessment & Plan:  Upper respiratory  tract infection, unspecified type Assessment & Plan: Azithromycin 250 mg twice daily x 5 days,  Prednisone 20 mg twice day, Xyzal PRN Advise patient to rest to support your body's recovery. Stay hydrated by drinking water, tea, or broth. Using a humidifier can help soothe throat irritation and ease nasal congestion. For fever or pain, acetaminophen (Tylenol) is recommended. To relieve other symptoms, try saline nasal sprays, throat lozenges, or gargling with saltwater. Focus on eating light, healthy meals like fruits and vegetables to keep your strength up. Practice good hygiene by washing your hands frequently and covering your mouth when coughing or sneezing.Follow-up for worsening or persistent symptoms. Patient verbalizes understanding regarding  plan of care and all questions answered    Orders: -     predniSONE; Take 1 tablet (20 mg total) by mouth 2 (two) times daily with a meal for 5 days.  Dispense: 10 tablet; Refill: 0 -     Azithromycin; Take 2 tablets on day 1, then 1 tablet daily on days 2 through 5  Dispense: 6 tablet; Refill: 0  Allergic sinusitis -     Levocetirizine Dihydrochloride; Take 1 tablet (5 mg total) by mouth every evening.  Dispense: 30 tablet; Refill: 5    Return if symptoms worsen or fail to improve.   Cruzita Lederer Newman Nip, FNP

## 2023-04-29 NOTE — Assessment & Plan Note (Signed)
Azithromycin 250 mg twice daily x 5 days,  Prednisone 20 mg twice day, Xyzal PRN Advise patient to rest to support your body's recovery. Stay hydrated by drinking water, tea, or broth. Using a humidifier can help soothe throat irritation and ease nasal congestion. For fever or pain, acetaminophen (Tylenol) is recommended. To relieve other symptoms, try saline nasal sprays, throat lozenges, or gargling with saltwater. Focus on eating light, healthy meals like fruits and vegetables to keep your strength up. Practice good hygiene by washing your hands frequently and covering your mouth when coughing or sneezing.Follow-up for worsening or persistent symptoms. Patient verbalizes understanding regarding plan of care and all questions answered

## 2023-04-29 NOTE — Patient Instructions (Signed)

## 2023-05-03 ENCOUNTER — Other Ambulatory Visit: Payer: Self-pay | Admitting: Internal Medicine

## 2023-05-03 DIAGNOSIS — K219 Gastro-esophageal reflux disease without esophagitis: Secondary | ICD-10-CM

## 2023-05-14 ENCOUNTER — Telehealth: Payer: Self-pay | Admitting: Internal Medicine

## 2023-05-14 NOTE — Telephone Encounter (Signed)
Copied from CRM [id number]. Topic: General - Billing Inquiry >> May 13, 2023 10:59 AM Herbert Seta B wrote:     Reason for CRM: Peyton Najjar from [employer] called with patient on line, wants to know if Dr. Allena Katz will accept the Medicare Advantage plan?

## 2023-05-21 DIAGNOSIS — R911 Solitary pulmonary nodule: Secondary | ICD-10-CM | POA: Diagnosis not present

## 2023-05-21 DIAGNOSIS — J189 Pneumonia, unspecified organism: Secondary | ICD-10-CM | POA: Diagnosis not present

## 2023-05-21 DIAGNOSIS — R052 Subacute cough: Secondary | ICD-10-CM | POA: Diagnosis not present

## 2023-06-02 ENCOUNTER — Encounter: Payer: Self-pay | Admitting: Internal Medicine

## 2023-06-02 ENCOUNTER — Ambulatory Visit (INDEPENDENT_AMBULATORY_CARE_PROVIDER_SITE_OTHER): Payer: Medicare HMO | Admitting: Internal Medicine

## 2023-06-02 VITALS — BP 132/81 | HR 109 | Ht 66.0 in | Wt 118.4 lb

## 2023-06-02 DIAGNOSIS — J309 Allergic rhinitis, unspecified: Secondary | ICD-10-CM | POA: Diagnosis not present

## 2023-06-02 DIAGNOSIS — M059 Rheumatoid arthritis with rheumatoid factor, unspecified: Secondary | ICD-10-CM

## 2023-06-02 DIAGNOSIS — R911 Solitary pulmonary nodule: Secondary | ICD-10-CM | POA: Insufficient documentation

## 2023-06-02 DIAGNOSIS — J189 Pneumonia, unspecified organism: Secondary | ICD-10-CM | POA: Insufficient documentation

## 2023-06-02 MED ORDER — PSEUDOEPHEDRINE-CODEINE-GG 30-10-100 MG/5ML PO SOLN
10.0000 mL | Freq: Four times a day (QID) | ORAL | 0 refills | Status: DC | PRN
Start: 2023-06-02 — End: 2023-09-29

## 2023-06-02 MED ORDER — AZELASTINE HCL 0.1 % NA SOLN
2.0000 | Freq: Two times a day (BID) | NASAL | 3 refills | Status: DC
Start: 2023-06-02 — End: 2023-09-29

## 2023-06-02 NOTE — Assessment & Plan Note (Addendum)
Tried Azithromycin and later Moxifloxacin Urgent care chart reviewed Symptoms overall improved, but has residual cough Prescribed Cheratussin as needed for cough Needs repeat imaging after 2 weeks - will get CT chest as CXR showed 1.5 cm RLL lung nodule, as per chart review from Urgent care

## 2023-06-02 NOTE — Progress Notes (Signed)
Established Patient Office Visit  Subjective:  Patient ID: Laurie Wallace, female    DOB: [birth year]  Age: 77 y.o. MRN: [id number]  CC:  Chief Complaint  Patient presents with   Follow-up    Urgent care visit for CAP on 05/21/23    HPI Laurie Wallace is a 77 y.o. female with past medical history of RA and osteoporosis who presents for f/u of recent urgent care visit for CAP on 05/21/23.  She went to urgent care as her symptoms of URTI did not improve after taking azithromycin.  She had CXR done, which showed LLL infiltrates and RLL pulmonary nodule - 1.5 cm in size. She was given moxifloxacin, prednisone and promethazine-DM syrup.  She has noticed improvement in nasal congestion, but still has dry cough.  Denies any dyspnea or wheezing.  Denies fever, chills, nausea or vomiting.    Past Medical History:  Diagnosis Date   Allergy    Arthritis    COPD (chronic obstructive pulmonary disease) (HCC)     Past Surgical History:  Procedure Laterality Date   ABDOMINAL HYSTERECTOMY     TOE SURGERY Right    1st toe    Family History  Problem Relation Age of Onset   Cancer Mother    Cancer Father    Heart failure Sister    COPD Sister    Dementia Sister     Social History   Socioeconomic History   Marital status: Single    Spouse name: Not on file   Number of children: Not on file   Years of education: Not on file   Highest education level: Not on file  Occupational History   Occupation: full time  Tobacco Use   Smoking status: Never    Passive exposure: Never   Smokeless tobacco: Never  Vaping Use   Vaping status: Never Used  Substance and Sexual Activity   Alcohol use: No   Drug use: No   Sexual activity: Not Currently  Other Topics Concern   Not on file  Social History Narrative   Lives with her son   Right Handed   Drinks 1-2 cups caffeine daily in winter   Social Determinants of Health   Financial Resource Strain: Low Risk   (05/07/2021)   Overall Financial Resource Strain (CARDIA)    Difficulty of Paying Living Expenses: Not hard at all  Food Insecurity: No Food Insecurity (01/10/2023)   Hunger Vital Sign    Worried About Running Out of Food in the Last Year: Never true    Ran Out of Food in the Last Year: Never true  Transportation Needs: No Transportation Needs (01/10/2023)   PRAPARE - Administrator, Civil Service (Medical): No    Lack of Transportation (Non-Medical): No  Physical Activity: Sufficiently Active (05/07/2021)   Exercise Vital Sign    Days of Exercise per Week: 5 days    Minutes of Exercise per Session: 30 min  Stress: No Stress Concern Present (05/07/2021)   [employer] of Occupational Health - Occupational Stress Questionnaire    Feeling of Stress : Not at all  Social Connections: Moderately Isolated (05/07/2021)   Social Connection and Isolation Panel [NHANES]    Frequency of Communication with Friends and Family: More than three times a week    Frequency of Social Gatherings with Friends and Family: Three times a week    Attends Religious Services: More than 4 times per year    Active Member of Clubs or  Organizations: No    Attends Banker Meetings: Never    Marital Status: Widowed  Intimate Partner Violence: Not At Risk (05/07/2021)   Humiliation, Afraid, Rape, and Kick questionnaire    Fear of Current or Ex-Partner: No    Emotionally Abused: No    Physically Abused: No    Sexually Abused: No    Outpatient Medications Prior to Visit  Medication Sig Dispense Refill   alendronate (FOSAMAX) 70 MG tablet Take 1 tablet (70 mg total) by mouth every 7 (seven) days. Take with a full glass of water on an empty stomach. (Patient taking differently: Take 70 mg by mouth every Monday.) 4 tablet 11   famotidine (PEPCID) 40 MG tablet TAKE ONE TABLET BY MOUTH EVERY DAY 30 tablet 1   folic acid (FOLVITE) 1 MG tablet Take 1 tablet (1 mg total) by mouth daily. 90 tablet 3    Iron, Ferrous Sulfate, 325 (65 Fe) MG TABS Take 325 mg by mouth daily. 30 tablet 5   levocetirizine (XYZAL) 5 MG tablet Take 1 tablet (5 mg total) by mouth every evening. 30 tablet 5   methotrexate (RHEUMATREX) 2.5 MG tablet Take 6 tablets (15 mg total) by mouth once a week. Caution:Chemotherapy. Protect from light. 72 tablet 0   ondansetron (ZOFRAN) 4 MG tablet Take 1 tablet (4 mg total) by mouth every 8 (eight) hours as needed for nausea or vomiting. 20 tablet 0   azithromycin (ZITHROMAX) 250 MG tablet Take 2 tablets on day 1, then 1 tablet daily on days 2 through 5 6 tablet 0   No facility-administered medications prior to visit.    No Known Allergies  ROS Review of Systems  Constitutional:  Negative for chills and fever.  HENT:  Positive for congestion and postnasal drip. Negative for sinus pressure, sinus pain and sore throat.   Eyes:  Negative for pain and discharge.  Respiratory:  Positive for cough. Negative for shortness of breath.   Cardiovascular:  Negative for chest pain and palpitations.  Gastrointestinal:  Negative for constipation, diarrhea, nausea and vomiting.  Endocrine: Negative for polydipsia and polyuria.  Genitourinary:  Negative for dysuria and hematuria.  Musculoskeletal:  Positive for arthralgias, back pain and myalgias. Negative for neck pain and neck stiffness.  Skin:  Negative for rash.  Neurological:  Negative for dizziness and weakness.  Psychiatric/Behavioral:  Negative for agitation and behavioral problems.       Objective:    Physical Exam Vitals reviewed.  Constitutional:      General: She is not in acute distress.    Appearance: She is not diaphoretic.  HENT:     Head: Normocephalic and atraumatic.     Nose: Nose normal.     Mouth/Throat:     Mouth: Mucous membranes are moist.  Eyes:     General: No scleral icterus.    Extraocular Movements: Extraocular movements intact.  Cardiovascular:     Rate and Rhythm: Normal rate and regular  rhythm.     Pulses: Normal pulses.     Heart sounds: Normal heart sounds. No murmur heard. Pulmonary:     Breath sounds: Normal breath sounds. No wheezing or rales.  Musculoskeletal:        General: Deformity (Small joints of b/l hands with mild ulnar deviation) present.     Cervical back: Neck supple. No tenderness.     Lumbar back: Tenderness present. Negative right straight leg raise test and negative left straight leg raise test.  Right lower leg: No edema.     Left lower leg: No edema.  Skin:    General: Skin is warm.     Findings: No rash.  Neurological:     General: No focal deficit present.     Mental Status: She is alert and oriented to person, place, and time.     Sensory: No sensory deficit.     Motor: No weakness.  Psychiatric:        Mood and Affect: Mood normal.        Behavior: Behavior normal.     BP 132/81 (BP Location: Left Arm, Patient Position: Sitting, Cuff Size: Normal)   Pulse (!) 109   Ht 5\' 6"  (1.676 m)   Wt 118 lb 6.4 oz (53.7 kg)   SpO2 95%   BMI 19.11 kg/m  Wt Readings from Last 3 Encounters:  06/02/23 118 lb 6.4 oz (53.7 kg)  04/29/23 115 lb (52.2 kg)  03/24/23 113 lb (51.3 kg)    Lab Results  Component Value Date   TSH 1.950 10/17/2020   Lab Results  Component Value Date   WBC 12.8 (H) 03/24/2023   HGB 11.1 (L) 03/24/2023   HCT 34.4 (L) 03/24/2023   MCV 90.5 03/24/2023   PLT 330 03/24/2023   Lab Results  Component Value Date   NA 139 03/24/2023   K 4.7 03/24/2023   CO2 25 03/24/2023   GLUCOSE 86 03/24/2023   BUN 15 03/24/2023   CREATININE 0.74 03/24/2023   BILITOT 0.3 03/24/2023   ALKPHOS 113 01/29/2023   AST 14 03/24/2023   ALT 6 03/24/2023   PROT 7.4 03/24/2023   ALBUMIN 3.8 01/29/2023   CALCIUM 9.4 03/24/2023   ANIONGAP 9 01/14/2023   EGFR 83 03/24/2023   No results found for: "CHOL" No results found for: "HDL" No results found for: "LDLCALC" No results found for: "TRIG" No results found for: "CHOLHDL" Lab  Results  Component Value Date   HGBA1C 5.8 (H) 01/10/2023      Assessment & Plan:   Problem List Items Addressed This Visit       Respiratory   Allergic sinusitis    Continue Xyzal Prescribed Astelin nasal spray      Relevant Medications   azelastine (ASTELIN) 0.1 % nasal spray   pseudoephedrine-codeine-guaifenesin (MYTUSSIN DAC) 30-10-100 MG/5ML solution   Pulmonary nodule 1 cm or greater in diameter    Has 1.5 cm RLL lung nodule, noted on CXR Check CT chest      Relevant Orders   CT Chest Wo Contrast   Community acquired pneumonia of left lower lobe of lung - Primary    Tried Azithromycin and later Moxifloxacin Urgent care chart reviewed Symptoms overall improved, but has residual cough Prescribed Cheratussin as needed for cough Needs repeat imaging after 2 weeks - will get CT chest as CXR showed 1.5 cm RLL lung nodule, as per chart review from Urgent care      Relevant Medications   azelastine (ASTELIN) 0.1 % nasal spray   pseudoephedrine-codeine-guaifenesin (MYTUSSIN DAC) 30-10-100 MG/5ML solution     Musculoskeletal and Integument   Seropositive rheumatoid arthritis (HCC)    Followed by Rheumatology - last note reviewed Symptoms better with Methotrexate Continue folic acid       Meds ordered this encounter  Medications   azelastine (ASTELIN) 0.1 % nasal spray    Sig: Place 2 sprays into both nostrils 2 (two) times daily. Use in each nostril as directed    Dispense:  30 mL    Refill:  3   pseudoephedrine-codeine-guaifenesin (MYTUSSIN DAC) 30-10-100 MG/5ML solution    Sig: Take 10 mLs by mouth 4 (four) times daily as needed for cough.    Dispense:  120 mL    Refill:  0    Follow-up: Return if symptoms worsen or fail to improve.    Anabel Halon, MD

## 2023-06-02 NOTE — Assessment & Plan Note (Signed)
Continue Xyzal Prescribed Astelin nasal spray

## 2023-06-02 NOTE — Patient Instructions (Signed)
Please take Cheratussin as needed for cough. It can cause sleepiness.  Please start using Azelastine nasal spray for nasal congestion and allergies.  You are being scheduled to get CT chest done.

## 2023-06-02 NOTE — Assessment & Plan Note (Signed)
Followed by Rheumatology - last note reviewed Symptoms better with Methotrexate Continue folic acid

## 2023-06-02 NOTE — Assessment & Plan Note (Signed)
Has 1.5 cm RLL lung nodule, noted on CXR Check CT chest

## 2023-06-11 NOTE — Progress Notes (Signed)
Office Visit Note  Patient: Laurie Wallace             Date of Birth: 1945-12-16           MRN: [id number]             PCP: Anabel Halon, MD Referring: Anabel Halon, MD Visit Date: 06/24/2023   Subjective:  Follow-up   History of Present Illness:   Discussed the use of AI scribe software for clinical note transcription with the patient, who gave verbal consent to proceed.  History of Present Illness   Laurie Wallace is a 77 y.o. female here for follow up for follow up for seropositive RA on methotrexate 15 mg p.o. weekly and folic acid 1 mg daily.  She was recently ill with pneumonia three weeks ago, but reports overall good health. They have noticed a contracture on the palm of their hand, which they describe as a little stiff at times but not painful. They also mention occasional soreness in their knee, but it does not seem to significantly bother them.  In terms of their rheumatoid arthritis, they report that it has been well-managed and they have not experienced significant pain or swelling.   Previous HPI 03/24/2023 Laurie Wallace is a 77 y.o. female here for follow up for seropositive RA on methotrexate 15 mg p.o. weekly and folic acid 1 mg daily.  At our last visit had discussed increasing medication due to increased symptoms especially in upper extremities and elevated sedimentation rate.  However she was sick with colitis with severe diarrhea weakness hypokalemia and interrupted treatment for this.  She resumed her methotrexate at 15 mg p.o. weekly after follow-up with PCP currently feels like her arthritis is doing well.  GI symptoms are mostly resolved she still has reduced appetite has been unable to regain any her unintentional weight loss.   Previous HPI 12/22/2022 Laurie Wallace is a 77 y.o. female here for follow up for seropositive RA on methotrexate 20 mg p.o. weekly and folic acid 1 mg daily.  Overall she feels  symptoms are doing pretty well though still describes some pain in both shoulders and stiffness in her hands in the mornings.  Some pain around the back of her neck and shoulders often more bothersome at night.  No flareup of joint swelling has not required any additional steroid treatments.  She denies any interval significant infections or antibiotics treatment.   Previous HPI 09/21/22 Laurie Wallace is a 77 y.o. female here for follow up for seropositive RA on methotrexate 20 mg p.o. weekly and folic acid 1 mg daily.  Symptoms were doing pretty well with some ongoing left shoulder pain around baseline until acutely developed pain and swelling on the back of her right forearm and wrist at the end of December.  Did not recall any preceding illness no preceding injury to the site or change in usual activities.  Concern was for cellulitis versus RA flareup and treated with course of oral doxycycline and oral prednisone medication for 5 days.  She felt symptoms almost entirely cleared up after the first 2 days of treatment.  There is still been a small persistent swelling or nodule on the affected area ever since. Occasionally notices tenderness at the site but no pain with use.   Previous HPI 06/22/22 Laurie Wallace is a 77 y.o. female here for follow up for seropositive RA on MTX 20 mg PO weekly and folic acid 1 mg  daily. She is doing well recently, slightly less busy with work so less lifting and shoulder pain is improved. Right upper arm still with some pain and stiffness. Not seeing any swelling and hand and knee pain is all doing well. Notices small increase in symptoms with the recent cooler weather usually just with the rain.   Previous HPI 02/26/22 Laurie Wallace is a 77 y.o. female here for follow up seropositive RA.  She has been off of the p.o. methotrexate for about a month.  She experienced increased joint pain in shoulders and knees off the medication.  She  took a Medrol Dosepak which improved joint symptoms pretty quickly.   Previous HPI 10/13/2021 Laurie Wallace is a 77 y.o. female here for follow up for seropositive RA on methotrexate 20 mg PO weekly. She is doing well since last visit her shoulder pain increases on days with poor weather or if she uses her arms a lot more than usual. Otherwise pain free on some days. She had COVID infection last month treated with paxlovid and tessalon and tylenol with good improvement of symptoms.    Previous HPI 07/10/21 Laurie Wallace is a 77 y.o. female here for follow up for seropositive RA on methotrexate 20 mg PO weekly which was increased after last visit.  She feels symptoms are overall improving she has 0 pain on some days occasionally swelling and stiffness affecting the left arm.  Left shoulder range of motion remains slightly reduced but with much less pain.  She denies any new side effects on increased methotrexate dose.  She has regained about 5 pounds since having some unintentional weight loss last year denies any GI complaints reports normal appetite.   Previous HPI   12/11/20 Laurie Wallace is a 77 y.o. female here for evaluation of joint pains in multiple areas with elevated inflammatory markers.  She believes these symptoms in her shoulder started since about 4 5 months ago without any specific preceding injury or event that she can recall.  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  She does not recall any past significant shoulder injuries or surgeries. Previous treatments tried include cymbalta that she does not notice a significant improvement.  Since last month she was started on oral daily prednisone for the past few weeks and has noticed a significant improvement in her joint symptoms particularly resolution of the hand swelling although does continue having some left  shoulder pain.   LAbs reviewed 10/2020 ANA neg ACE neg RF neg ANCAs neg ESR 68 CRP 25 Alk phos 126 Plts 495 CK 55   Review of Systems  Constitutional:  Positive for fatigue.  HENT:  Negative for mouth sores and mouth dryness.   Eyes:  Negative for dryness.  Respiratory:  Positive for shortness of breath.   Cardiovascular:  Negative for chest pain and palpitations.  Gastrointestinal:  Negative for blood in stool, constipation and diarrhea.  Endocrine: Negative for increased urination.  Genitourinary:  Negative for involuntary urination.  Musculoskeletal:  Positive for joint pain, joint pain and muscle weakness. Negative for gait problem, joint swelling, myalgias, morning stiffness, muscle tenderness and myalgias.  Skin:  Negative for color change, rash, hair loss and sensitivity to sunlight.  Allergic/Immunologic: Positive for susceptible to infections.  Neurological:  Negative for dizziness and headaches.  Hematological:  Negative for swollen glands.  Psychiatric/Behavioral:  Negative for depressed mood and  sleep disturbance. The patient is nervous/anxious.     PMFS History:  Patient Active Problem List   Diagnosis Date Noted   Pulmonary nodule 1 cm or greater in diameter 06/02/2023   Community acquired pneumonia of left lower lobe of lung 06/02/2023   Microcytic anemia 02/17/2023   Hospital discharge follow-up 01/18/2023   Gastroesophageal reflux disease 01/18/2023   Gait disturbance 01/18/2023   Nonintractable episodic headache 01/18/2023   Infectious colitis 01/10/2023   Hypokalemia 01/10/2023   Thrombocytosis 01/10/2023   Bilateral shoulder pain 12/22/2022   Localized swelling of right forearm 09/21/2022   Subcutaneous cyst 07/20/2022   Allergic sinusitis 01/14/2022   Encounter for general adult medical examination with abnormal findings 07/17/2021   Age-related osteoporosis without current pathological fracture 07/17/2021   Muscle cramps 02/28/2021   High risk  medication use 12/26/2020   Seropositive rheumatoid arthritis (HCC) 10/25/2020   Moderate protein-calorie malnutrition (HCC) 10/25/2020   Cervical spine pain 10/23/2020   Lumbar pain 10/23/2020   Other intervertebral disc displacement, lumbar region 04/07/2010    Past Medical History:  Diagnosis Date   Allergy    Arthritis    COPD (chronic obstructive pulmonary disease) (HCC)     Family History  Problem Relation Age of Onset   Cancer Mother    Cancer Father    Heart failure Sister    COPD Sister    Dementia Sister    Past Surgical History:  Procedure Laterality Date   ABDOMINAL HYSTERECTOMY     TOE SURGERY Right    1st toe   Social History   Social History Narrative   Lives with her son   Right Handed   Drinks 1-2 cups caffeine daily in winter   Immunization History  Administered Date(s) Administered   PNEUMOCOCCAL CONJUGATE-20 02/28/2021     Objective: Vital Signs: BP 128/74 (BP Location: Left Arm, Patient Position: Sitting, Cuff Size: Normal)   Pulse (!) 102   Resp 14   Ht 5\' 6"  (1.676 m)   Wt 120 lb (54.4 kg)   BMI 19.37 kg/m    Physical Exam Cardiovascular:     Rate and Rhythm: Normal rate and regular rhythm.  Pulmonary:     Effort: Pulmonary effort is normal.     Breath sounds: Normal breath sounds.  Skin:    General: Skin is warm and dry.  Neurological:     Mental Status: She is alert.  Psychiatric:        Mood and Affect: Mood normal.      Musculoskeletal Exam:  Shoulders full ROM no tenderness or swelling Elbows full ROM no tenderness or swelling Wrists full ROM no tenderness or swelling Fingers severe 1st CMC joint osteoarthritis milder heberdon's nodes in other digits, no palpable synovitis Right 3rd finger palmar contracture but passive full extension near normal, no pain Knees full ROM no tenderness or swelling    Investigation: No additional findings.  Imaging: CT Chest Wo Contrast Result Date: 07/03/2023 CLINICAL DATA:   Right lower lobe pulmonary nodule on recent chest x-ray. EXAM: CT CHEST WITHOUT CONTRAST TECHNIQUE: Multidetector CT imaging of the chest was performed following the standard protocol without IV contrast. RADIATION DOSE REDUCTION: This exam was performed according to the departmental dose-optimization program which includes automated exposure control, adjustment of the mA and/or kV according to patient size and/or use of iterative reconstruction technique. COMPARISON:  Previous chest x-ray is not available for comparison. FINDINGS: Cardiovascular: Limited due to lack of IV contrast. No aneurysmal dilatation of the  aorta is seen. No cardiac enlargement is noted. Minimal pericardial fluid is seen. No coronary calcifications are noted. Mediastinum/Nodes: Thoracic inlet is within normal limits. No hilar or mediastinal adenopathy is noted. The esophagus as visualized is within normal limits. Lungs/Pleura: Lungs demonstrate biapical pleural and parenchymal scarring. Moderate left-sided pleural effusion is seen without focal infiltrate or parenchymal nodule. The right lung shows a subpleural somewhat nodular density which measures approximately 6 mm in greatest dimension. In comparison with the prior CT of the abdomen and pelvis from 01/09/2023 this lesion was not visualized suggesting that this is postinflammatory in nature. No other focal nodule is seen. Upper Abdomen: Visualized upper abdomen is within normal limits. Musculoskeletal: No chest wall mass or suspicious bone lesions identified. IMPRESSION: 6 mm nodular density along the pleural margin of the right lower lobe laterally. This was not seen on a prior CT of the abdomen and pelvis from 01/09/2023. this suggests an inflammatory etiology although Per Fleischner Society Guidelines, recommend a non-contrast Chest CT at 6-12 months. If patient is high risk for malignancy, consider an additional non-contrast Chest CT at 18-24 months. If patient is low risk for  malignancy, non-contrast Chest CT at 18-24 months is optional. These guidelines do not apply to immunocompromised patients and patients with cancer. Follow up in patients with significant comorbidities as clinically warranted. For lung cancer screening, adhere to Lung-RADS guidelines. Reference: Radiology. 2017; 284(1):228-43. Electronically Signed   By: Alcide Clever M.D.   On: 07/03/2023 21:41    Recent Labs: Lab Results  Component Value Date   WBC 8.2 06/24/2023   HGB 10.1 (L) 06/24/2023   PLT 387 06/24/2023   NA 140 06/24/2023   K 4.7 06/24/2023   CL 106 06/24/2023   CO2 27 06/24/2023   GLUCOSE 87 06/24/2023   BUN 8 06/24/2023   CREATININE 0.76 06/24/2023   BILITOT 0.3 06/24/2023   ALKPHOS 113 01/29/2023   AST 12 06/24/2023   ALT 10 06/24/2023   PROT 6.6 06/24/2023   PROT 6.6 06/24/2023   ALBUMIN 3.8 01/29/2023   CALCIUM 8.5 (L) 06/24/2023   GFRAA 88 09/28/2014    Speciality Comments: No specialty comments available.  Procedures:  No procedures performed Allergies: Patient has no known allergies.   Assessment / Plan:     Visit Diagnoses: Seropositive rheumatoid arthritis (HCC) - Plan: Sedimentation rate, methotrexate (RHEUMATREX) 2.5 MG tablet Previously elevated sedimentation rate (117) in the absence of significant joint swelling or pain. Discussed the potential causes including infection, injury, or other systemic issues. -Repeat sedimentation rate to assess for changes. -Plan to continued methotrexate 15 mg PO weekly folic acid 1 mg daily  High risk medication use - methotrexate 15 mg p.o. weekly and folic acid 1 mg daily. - Plan: CBC with Differential/Platelet, COMPLETE METABOLIC PANEL WITH GFR Sick with a recent case of penumonia, resolving uncomplicated. -Check CBC and CMP for medication monitoring  Sedimentation rate elevation - Plan: Protein electrophoresis, serum Also checking SPEP screening for alternate causes with persistent ESR high out of proportion to  symptoms.  Dupuytren's Contracture Mild contracture of the palm causing stiffness but not pain or functional impairment. Discussed the pathophysiology and treatment options including surgery or injections if it becomes symptomatic. -Continue with regular hand and wrist stretches.   Orders: Orders Placed This Encounter  Procedures   Sedimentation rate   CBC with Differential/Platelet   COMPLETE METABOLIC PANEL WITH GFR   Protein electrophoresis, serum   Protein electrophoresis, serum   Meds ordered this encounter  Medications   methotrexate (RHEUMATREX) 2.5 MG tablet    Sig: Take 6 tablets (15 mg total) by mouth once a week. Caution:Chemotherapy. Protect from light.    Dispense:  72 tablet    Refill:  0     Follow-Up Instructions: Return in about 3 months (around 09/22/2023) for RA on MTX f/u 3mos.   Fuller Plan, MD  Note - This record has been created using [employer].  Chart creation errors have been sought, but may not always  have been located. Such creation errors do not reflect on  the standard of medical care.

## 2023-06-14 ENCOUNTER — Ambulatory Visit (INDEPENDENT_AMBULATORY_CARE_PROVIDER_SITE_OTHER): Payer: Medicare HMO

## 2023-06-14 VITALS — Ht 66.0 in | Wt 118.0 lb

## 2023-06-14 DIAGNOSIS — Z0000 Encounter for general adult medical examination without abnormal findings: Secondary | ICD-10-CM

## 2023-06-14 DIAGNOSIS — Z780 Asymptomatic menopausal state: Secondary | ICD-10-CM

## 2023-06-14 DIAGNOSIS — H9193 Unspecified hearing loss, bilateral: Secondary | ICD-10-CM

## 2023-06-14 DIAGNOSIS — Z1231 Encounter for screening mammogram for malignant neoplasm of breast: Secondary | ICD-10-CM

## 2023-06-14 NOTE — Progress Notes (Signed)
Please attest and cosign this visit due to patients primary care provider not being in the office at the time the visit was completed.   Because this visit was a virtual/telehealth visit,  certain criteria was not obtained, such a blood pressure, CBG if applicable, and timed get up and go. Any medications not marked as "taking" were not mentioned during the medication reconciliation part of the visit. Any vitals not documented were not able to be obtained due to this being a telehealth visit or patient was unable to self-report a recent blood pressure reading due to a lack of equipment at home via telehealth. Vitals that have been documented are verbally provided by the patient.   Subjective:   Laurie Wallace is a 77 y.o. female who presents for Medicare Annual (Subsequent) preventive examination.  Visit Complete: Virtual I connected with  Laurie Wallace on 06/14/23 by a audio enabled telemedicine application and verified that I am speaking with the correct person using two identifiers.  Patient Location: Home  Provider Location: Home Office  I discussed the limitations of evaluation and management by telemedicine. The patient expressed understanding and agreed to proceed.  Vital Signs: Because this visit was a virtual/telehealth visit, some criteria may be missing or patient reported. Any vitals not documented were not able to be obtained and vitals that have been documented are patient reported.  Patient Medicare AWV questionnaire was completed by the patient on na; I have confirmed that all information answered by patient is correct and no changes since this date.  Cardiac Risk Factors include: advanced age (>103men, >33 women)     Objective:    Today's Vitals   06/14/23 0929 06/14/23 0931  Weight: 118 lb (53.5 kg)   Height: 5\' 6"  (1.676 m)   PainSc:  4    Body mass index is 19.05 kg/m.     06/14/2023    9:28 AM 01/09/2023    8:08 PM 05/12/2022    8:38 AM  05/11/2022   11:06 AM 05/07/2021    9:43 AM 03/28/2018    1:04 PM 02/27/2018    7:33 AM  Advanced Directives  Does Patient Have a Medical Advance Directive? No No No No Yes No No  Type of Advance Directive     Healthcare Power of Attorney    Does patient want to make changes to medical advance directive?     Yes (MAU/Ambulatory/Procedural Areas - Information given)    Would patient like information on creating a medical advance directive? Yes (MAU/Ambulatory/Procedural Areas - Information given) No - Patient declined --  Yes (MAU/Ambulatory/Procedural Areas - Information given) No - Patient declined No - Patient declined    Current Medications (verified) Outpatient Encounter Medications as of 06/14/2023  Medication Sig   alendronate (FOSAMAX) 70 MG tablet Take 1 tablet (70 mg total) by mouth every 7 (seven) days. Take with a full glass of water on an empty stomach. (Patient taking differently: Take 70 mg by mouth every Monday.)   azelastine (ASTELIN) 0.1 % nasal spray Place 2 sprays into both nostrils 2 (two) times daily. Use in each nostril as directed   famotidine (PEPCID) 40 MG tablet TAKE ONE TABLET BY MOUTH EVERY DAY   folic acid (FOLVITE) 1 MG tablet Take 1 tablet (1 mg total) by mouth daily.   Iron, Ferrous Sulfate, 325 (65 Fe) MG TABS Take 325 mg by mouth daily.   levocetirizine (XYZAL) 5 MG tablet Take 1 tablet (5 mg total) by mouth every evening.  methotrexate (RHEUMATREX) 2.5 MG tablet Take 6 tablets (15 mg total) by mouth once a week. Caution:Chemotherapy. Protect from light.   ondansetron (ZOFRAN) 4 MG tablet Take 1 tablet (4 mg total) by mouth every 8 (eight) hours as needed for nausea or vomiting.   pseudoephedrine-codeine-guaifenesin (MYTUSSIN DAC) 30-10-100 MG/5ML solution Take 10 mLs by mouth 4 (four) times daily as needed for cough.   [DISCONTINUED] brompheniramine (VAZOL) 2 MG/5ML LIQD Take 2 mg by mouth 2 (two) times daily.   [DISCONTINUED] fluticasone (FLONASE) 50 MCG/ACT  nasal spray Place 2 sprays into the nose daily.   No facility-administered encounter medications on file as of 06/14/2023.    Allergies (verified) Patient has no known allergies.   History: Past Medical History:  Diagnosis Date   Allergy    Arthritis    COPD (chronic obstructive pulmonary disease) (HCC)    Past Surgical History:  Procedure Laterality Date   ABDOMINAL HYSTERECTOMY     TOE SURGERY Right    1st toe   Family History  Problem Relation Age of Onset   Cancer Mother    Cancer Father    Heart failure Sister    COPD Sister    Dementia Sister    Social History   Socioeconomic History   Marital status: Single    Spouse name: Not on file   Number of children: Not on file   Years of education: Not on file   Highest education level: Not on file  Occupational History   Occupation: full time  Tobacco Use   Smoking status: Never    Passive exposure: Never   Smokeless tobacco: Never  Vaping Use   Vaping status: Never Used  Substance and Sexual Activity   Alcohol use: No   Drug use: No   Sexual activity: Not Currently  Other Topics Concern   Not on file  Social History Narrative   Lives with her son   Right Handed   Drinks 1-2 cups caffeine daily in winter   Social Determinants of Health   Financial Resource Strain: Low Risk  (06/14/2023)   Overall Financial Resource Strain (CARDIA)    Difficulty of Paying Living Expenses: Not hard at all  Food Insecurity: No Food Insecurity (06/14/2023)   Hunger Vital Sign    Worried About Running Out of Food in the Last Year: Never true    Ran Out of Food in the Last Year: Never true  Transportation Needs: No Transportation Needs (06/14/2023)   PRAPARE - Administrator, Civil Service (Medical): No    Lack of Transportation (Non-Medical): No  Physical Activity: Sufficiently Active (06/14/2023)   Exercise Vital Sign    Days of Exercise per Week: 5 days    Minutes of Exercise per Session: 30 min  Stress: No  Stress Concern Present (06/14/2023)   [employer] of Occupational Health - Occupational Stress Questionnaire    Feeling of Stress : Not at all  Social Connections: Moderately Isolated (06/14/2023)   Social Connection and Isolation Panel [NHANES]    Frequency of Communication with Friends and Family: More than three times a week    Frequency of Social Gatherings with Friends and Family: More than three times a week    Attends Religious Services: More than 4 times per year    Active Member of [employer] or Organizations: No    Attends Banker Meetings: Never    Marital Status: Widowed    Tobacco Counseling Counseling given: Yes   Clinical Intake:  Pre-visit preparation completed: Yes  Pain : 0-10 Pain Score: 4  Pain Type: Acute pain Pain Location: Back Pain Orientation: Lower Pain Descriptors / Indicators: Aching Pain Onset: 1 to 4 weeks ago Pain Frequency: Intermittent     BMI - recorded: 19.05 Nutritional Status: BMI of 19-24  Normal Nutritional Risks: None Diabetes: No  How often do you need to have someone help you when you read instructions, pamphlets, or other written materials from your doctor or pharmacy?: 1 - Never  Interpreter Needed?: No  Information entered by :: Abby Deneisha Dade, CMA   Activities of Daily Living    06/14/2023    9:38 AM 01/10/2023    1:55 PM  In your present state of health, do you have any difficulty performing the following activities:  Hearing? 0 0  Vision? 0 0  Difficulty concentrating or making decisions? 0 0  Walking or climbing stairs? 0 1  Dressing or bathing? 0 0  Doing errands, shopping? 0 0  Preparing Food and eating ? N   Using the Toilet? N   In the past six months, have you accidently leaked urine? N   Do you have problems with loss of bowel control? N   Managing your Medications? N   Managing your Finances? N   Housekeeping or managing your Housekeeping? N     Patient Care Team: Anabel Halon, MD as  PCP - General (Internal Medicine) Bing Neighbors, NP as Nurse Practitioner (Family Medicine)  Indicate any recent Medical Services you may have received from other than [facility] providers in the past year (date may be approximate).     Assessment:   This is a routine wellness examination for Andie.  Hearing/Vision screen Hearing Screening - Comments:: Patient complains of hearing difficulty. Referral placed for audiology today and patient in agreement with treatment plan Vision Screening - Comments:: Wears rx glasses - up to date with routine eye exams     Goals Addressed             This Visit's Progress    Patient Stated       Get better and have better health       Depression Screen    06/14/2023    9:40 AM 06/02/2023   12:59 PM 02/17/2023    9:52 AM 01/18/2023    8:50 AM 07/20/2022    8:56 AM 07/07/2022    3:49 PM 06/22/2022   10:54 AM  PHQ 2/9 Scores  PHQ - 2 Score 0 2 0 0 2 3 0  PHQ- 9 Score 0 5   5 9      Fall Risk    06/14/2023    9:46 AM 06/02/2023   12:59 PM 02/17/2023    9:52 AM 01/18/2023    8:49 AM 07/20/2022    8:55 AM  Fall Risk   Falls in the past year? 0 0 0 0 0  Number falls in past yr: 0 0 0 0 0  Injury with Fall? 0 0 0 0 0  Risk for fall due to : No Fall Risks No Fall Risks     Follow up Falls prevention discussed Falls evaluation completed       MEDICARE RISK AT HOME: Medicare Risk at Home Any stairs in or around the home?: Yes If so, are there any without handrails?: No Home free of loose throw rugs in walkways, pet beds, electrical cords, etc?: Yes Adequate lighting in your home to reduce risk of falls?: Yes Life  alert?: No Use of a cane, walker or w/c?: No Grab bars in the bathroom?: Yes Shower chair or bench in shower?: No Elevated toilet seat or a handicapped toilet?: No  TIMED UP AND GO:  Was the test performed?  No    Cognitive Function:    05/07/2021    9:44 AM  MMSE - Mini Mental State Exam  Not completed: Unable to  complete        06/14/2023    9:34 AM 05/12/2022    8:44 AM 05/07/2021    9:44 AM  6CIT Screen  What Year? 0 points 0 points 0 points  What month? 0 points 0 points 0 points  What time? 0 points 0 points 0 points  Count back from 20 0 points 0 points 4 points  Months in reverse 0 points 2 points 4 points  Repeat phrase 0 points 2 points 10 points  Total Score 0 points 4 points 18 points    Immunizations Immunization History  Administered Date(s) Administered   PNEUMOCOCCAL CONJUGATE-20 02/28/2021    TDAP status: Due, Education has been provided regarding the importance of this vaccine. Advised may receive this vaccine at local pharmacy or Health Dept. Aware to provide a copy of the vaccination record if obtained from local pharmacy or Health Dept. Verbalized acceptance and understanding.  Flu Vaccine status: Due, Education has been provided regarding the importance of this vaccine. Advised may receive this vaccine at local pharmacy or Health Dept. Aware to provide a copy of the vaccination record if obtained from local pharmacy or Health Dept. Verbalized acceptance and understanding.  Pneumococcal vaccine status: Up to date  Covid-19 vaccine status: Completed vaccines  Qualifies for Shingles Vaccine? Yes   Zostavax completed No   Shingrix Completed?: No.    Education has been provided regarding the importance of this vaccine. Patient has been advised to call insurance company to determine out of pocket expense if they have not yet received this vaccine. Advised may also receive vaccine at local pharmacy or Health Dept. Verbalized acceptance and understanding.  Screening Tests Health Maintenance  Topic Date Due   DTaP/Tdap/Td (1 - Tdap) Never done   Zoster Vaccines- Shingrix (1 of 2) Never done   Medicare Annual Wellness (AWV)  05/13/2023   INFLUENZA VACCINE  10/04/2023 (Originally 02/04/2023)   Pneumonia Vaccine 13+ Years old  Completed   DEXA SCAN  Completed   Hepatitis C  Screening  Completed   HPV VACCINES  Aged Out   COVID-19 Vaccine  Discontinued    Health Maintenance  Health Maintenance Due  Topic Date Due   DTaP/Tdap/Td (1 - Tdap) Never done   Zoster Vaccines- Shingrix (1 of 2) Never done   Medicare Annual Wellness (AWV)  05/13/2023    Colorectal cancer screening: No longer required.   Mammogram status: Ordered 06/14/2023. Pt provided with contact info and advised to call to schedule appt.   Bone Density status: Ordered 06/14/2023. Pt provided with contact info and advised to call to schedule appt.  Lung Cancer Screening: (Low Dose CT Chest recommended if Age 20-80 years, 20 pack-year currently smoking OR have quit w/in 15years.) does not qualify.   Lung Cancer Screening Referral: na  Additional Screening:  Hepatitis C Screening: does not qualify; Completed   Vision Screening: Recommended annual ophthalmology exams for early detection of glaucoma and other disorders of the eye. Is the patient up to date with their annual eye exam?  Yes  Who is the provider or what  is the name of the office in which the patient attends annual eye exams? Dr. Dione Booze If pt is not established with a provider, would they like to be referred to a provider to establish care? No .   Dental Screening: Recommended annual dental exams for proper oral hygiene  Diabetic Foot Exam: na  Community Resource Referral / Chronic Care Management: CRR required this visit?  No   CCM required this visit?  No     Plan:     I have personally reviewed and noted the following in the patient's chart:   Medical and social history Use of alcohol, tobacco or illicit drugs  Current medications and supplements including opioid prescriptions. Patient is not currently taking opioid prescriptions. Functional ability and status Nutritional status Physical activity Advanced directives List of other physicians Hospitalizations, surgeries, and ER visits in previous 12  months Vitals Screenings to include cognitive, depression, and falls Referrals and appointments  In addition, I have reviewed and discussed with patient certain preventive protocols, quality metrics, and best practice recommendations. A written personalized care plan for preventive services as well as general preventive health recommendations were provided to patient.     Jordan Hawks Baldemar Dady, CMA   06/14/2023   After Visit Summary: (Mail) Due to this being a telephonic visit, the after visit summary with patients personalized plan was offered to patient via mail   Nurse Notes: see routing comment

## 2023-06-14 NOTE — Patient Instructions (Signed)
Laurie Wallace , Thank you for taking time to come for your Medicare Wellness Visit. I appreciate your ongoing commitment to your health goals. Please review the following plan we discussed and let me know if I can assist you in the future.   Referrals/Orders/Follow-Ups/Clinician Recommendations:  Next Medicare Annual Wellness Visit: June 14, 2024 at 10:00 am virtual visit  You have been referred to have a hearing test due to hearing difficulties. If you have not heard from anyone in 7 business days, please call the number below to schedule your appointment.  [facility] [place], [place] [address] [place], [address] [place], [place] [address] Phone:[phone]   You have an order for:  []   2D Mammogram  [x]   3D Mammogram  [x]   Bone Density   []   Lung Cancer Screening  Please call for appointment:   [facility] [address]iology [place], [place] [address] [phone]  Make sure to wear two-piece clothing.  No lotions powders or deodorants the day of the appointment Make sure to bring picture ID and insurance card.  Bring list of medications you are currently taking including any supplements.   Schedule your [place] screening mammogram through MyChart!   Log into your MyChart account.  Go to 'Visit' (or 'Appointments' if on mobile App) --> Schedule an Appointment  Under 'Select a Reason for Visit' choose the Mammogram Screening option.  Complete the pre-visit questions and select the time and place that best fits your schedule.    This is a list of the screening recommended for you and due dates:  Health Maintenance  Topic Date Due   DTaP/Tdap/Td vaccine (1 - Tdap) Never done   Zoster (Shingles) Vaccine (1 of 2) Never done   Mammogram  10/28/2021   DEXA scan (bone density measurement)  10/29/2022   Flu Shot  10/04/2023*   Medicare Annual Wellness Visit  06/13/2024   Pneumonia Vaccine  Completed    Hepatitis C Screening  Completed   HPV Vaccine  Aged Out   COVID-19 Vaccine  Discontinued  *Topic was postponed. The date shown is not the original due date.    Advanced directives: (Provided) Advance directive discussed with you today. I have provided a copy for you to complete at home and have notarized. Once this is complete, please bring a copy in to our office so we can scan it into your chart.   Next Medicare Annual Wellness Visit scheduled for next year: Yes  Preventive Care 23 Years and Older, Female Preventive care refers to lifestyle choices and visits with your health care provider that can promote health and wellness. Preventive care visits are also called wellness exams. What can I expect for my preventive care visit? Counseling Your health care provider may ask you questions about your: Medical history, including: Past medical problems. Family medical history. Pregnancy and menstrual history. History of falls. Current health, including: Memory and ability to understand (cognition). Emotional well-being. Home life and relationship well-being. Sexual activity and sexual health. Lifestyle, including: Alcohol, nicotine or tobacco, and drug use. Access to firearms. Diet, exercise, and sleep habits. Work and work Astronomer. Sunscreen use. Safety issues such as seatbelt and bike helmet use. Physical exam Your health care provider will check your: Height and weight. These may be used to calculate your BMI (body mass index). BMI is a measurement that tells if you are at a healthy weight. Waist circumference. This measures the distance around your waistline. This  measurement also tells if you are at a healthy weight and may help predict your risk of certain diseases, such as type 2 diabetes and high blood pressure. Heart rate and blood pressure. Body temperature. Skin for abnormal spots. What immunizations do I need?  Vaccines are usually given at various ages,  according to a schedule. Your health care provider will recommend vaccines for you based on your age, medical history, and lifestyle or other factors, such as travel or where you work. What tests do I need? Screening Your health care provider may recommend screening tests for certain conditions. This may include: Lipid and cholesterol levels. Hepatitis C test. Hepatitis B test. HIV (human immunodeficiency virus) test. STI (sexually transmitted infection) testing, if you are at risk. Lung cancer screening. Colorectal cancer screening. Diabetes screening. This is done by checking your blood sugar (glucose) after you have not eaten for a while (fasting). Mammogram. Talk with your health care provider about how often you should have regular mammograms. BRCA-related cancer screening. This may be done if you have a family history of breast, ovarian, tubal, or peritoneal cancers. Bone density scan. This is done to screen for osteoporosis. Talk with your health care provider about your test results, treatment options, and if necessary, the need for more tests. Follow these instructions at home: Eating and drinking  Eat a diet that includes fresh fruits and vegetables, whole grains, lean protein, and low-fat dairy products. Limit your intake of foods with high amounts of sugar, saturated fats, and salt. Take vitamin and mineral supplements as recommended by your health care provider. Do not drink alcohol if your health care provider tells you not to drink. If you drink alcohol: Limit how much you have to 0-1 drink a day. Know how much alcohol is in your drink. In the [place] one drink equals one 12 oz bottle of beer (355 mL), one 5 oz glass of wine (148 mL), or one 1 oz glass of hard liquor (44 mL). Lifestyle Brush your teeth every morning and night with fluoride toothpaste. Floss one time each day. Exercise for at least 30 minutes 5 or more days each week. Do not use any products that contain  nicotine or tobacco. These products include cigarettes, chewing tobacco, and vaping devices, such as e-cigarettes. If you need help quitting, ask your health care provider. Do not use drugs. If you are sexually active, practice safe sex. Use a condom or other form of protection in order to prevent STIs. Take aspirin only as told by your health care provider. Make sure that you understand how much to take and what form to take. Work with your health care provider to find out whether it is safe and beneficial for you to take aspirin daily. Ask your health care provider if you need to take a cholesterol-lowering medicine (statin). Find healthy ways to manage stress, such as: Meditation, yoga, or listening to music. Journaling. Talking to a trusted person. Spending time with friends and family. Minimize exposure to UV radiation to reduce your risk of skin cancer. Safety Always wear your seat belt while driving or riding in a vehicle. Do not drive: If you have been drinking alcohol. Do not ride with someone who has been drinking. When you are tired or distracted. While texting. If you have been using any mind-altering substances or drugs. Wear a helmet and other protective equipment during sports activities. If you have firearms in your house, make sure you follow all gun safety procedures. What's next? Visit  your health care provider once a year for an annual wellness visit. Ask your health care provider how often you should have your eyes and teeth checked. Stay up to date on all vaccines. This information is not intended to replace advice given to you by your health care provider. Make sure you discuss any questions you have with your health care provider. Document Revised: 12/18/2020 Document Reviewed: 12/18/2020 Elsevier Patient Education  2024 [employer]. Understanding Your Risk for Falls Millions of people have serious injuries from falls each year. It is important to understand your  risk of falling. Talk with your health care provider about your risk and what you can do to lower it. If you do have a serious fall, make sure to tell your provider. Falling once raises your risk of falling again. How can falls affect me? Serious injuries from falls are common. These include: Broken bones, such as hip fractures. Head injuries, such as traumatic brain injuries (TBI) or concussions. A fear of falling can cause you to avoid activities and stay at home. This can make your muscles weaker and raise your risk for a fall. What can increase my risk? There are a number of risk factors that increase your risk for falling. The more risk factors you have, the higher your risk of falling. Serious injuries from a fall happen most often to people who are older than 77 years old. Teenagers and young adults ages 23-29 are also at higher risk. Common risk factors include: Weakness in the lower body. Being generally weak or confused due to long-term (chronic) illness. Dizziness or balance problems. Poor vision. Medicines that cause dizziness or drowsiness. These may include: Medicines for your blood pressure, heart, anxiety, insomnia, or swelling (edema). Pain medicines. Muscle relaxants. Other risk factors include: Drinking alcohol. Having had a fall in the past. Having foot pain or wearing improper footwear. Working at a dangerous job. Having any of the following in your home: Tripping hazards, such as floor clutter or loose rugs. Poor lighting. Pets. Having dementia or memory loss. What actions can I take to lower my risk of falling?     Physical activity Stay physically fit. Do strength and balance exercises. Consider taking a regular class to build strength and balance. Yoga and tai chi are good options. Vision Have your eyes checked every year and your prescription for glasses or contacts updated as needed. Shoes and walking aids Wear non-skid shoes. Wear shoes that have  rubber soles and low heels. Do not wear high heels. Do not walk around the house in socks or slippers. Use a cane or walker as told by your provider. Home safety Attach secure railings on both sides of your stairs. Install grab bars for your bathtub, shower, and toilet. Use a non-skid mat in your bathtub or shower. Attach bath mats securely with double-sided, non-slip rug tape. Use good lighting in all rooms. Keep a flashlight near your bed. Make sure there is a clear path from your bed to the bathroom. Use night-lights. Do not use throw rugs. Make sure all carpeting is taped or tacked down securely. Remove all clutter from walkways and stairways, including extension cords. Repair uneven or broken steps and floors. Avoid walking on icy or slippery surfaces. Walk on the grass instead of on icy or slick sidewalks. Use ice melter to get rid of ice on walkways in the winter. Use a cordless phone. Questions to ask your health care provider Can you help me check my risk for  a fall? Do any of my medicines make me more likely to fall? Should I take a vitamin D supplement? What exercises can I do to improve my strength and balance? Should I make an appointment to have my vision checked? Do I need a bone density test to check for weak bones (osteoporosis)? Would it help to use a cane or a walker? Where to find more information Centers for Disease Control and Prevention, [employer]: TonerPromos.no Community-Based Fall Prevention Programs: TonerPromos.no [employer] on Aging: BaseRingTones.pl Contact a health care provider if: You fall at home. You are afraid of falling at home. You feel weak, drowsy, or dizzy. This information is not intended to replace advice given to you by your health care provider. Make sure you discuss any questions you have with your health care provider. Document Revised: 02/23/2022 Document Reviewed: 02/23/2022 Elsevier Patient Education  2024 [employer].

## 2023-06-20 ENCOUNTER — Ambulatory Visit (HOSPITAL_COMMUNITY)
Admission: RE | Admit: 2023-06-20 | Discharge: 2023-06-20 | Disposition: A | Discharge status: Home / Self Care | Payer: Medicare HMO | Source: Ambulatory Visit | Attending: Internal Medicine | Admitting: Internal Medicine

## 2023-06-20 DIAGNOSIS — R911 Solitary pulmonary nodule: Secondary | ICD-10-CM | POA: Diagnosis not present

## 2023-06-24 ENCOUNTER — Ambulatory Visit: Payer: Medicare HMO | Attending: Internal Medicine | Admitting: Internal Medicine

## 2023-06-24 ENCOUNTER — Encounter: Payer: Self-pay | Admitting: Internal Medicine

## 2023-06-24 VITALS — BP 128/74 | HR 102 | Resp 14 | Ht 66.0 in | Wt 120.0 lb

## 2023-06-24 DIAGNOSIS — M25512 Pain in left shoulder: Secondary | ICD-10-CM | POA: Diagnosis not present

## 2023-06-24 DIAGNOSIS — Z79899 Other long term (current) drug therapy: Secondary | ICD-10-CM | POA: Diagnosis not present

## 2023-06-24 DIAGNOSIS — M25511 Pain in right shoulder: Secondary | ICD-10-CM

## 2023-06-24 DIAGNOSIS — M059 Rheumatoid arthritis with rheumatoid factor, unspecified: Secondary | ICD-10-CM | POA: Diagnosis not present

## 2023-06-24 DIAGNOSIS — R700 Elevated erythrocyte sedimentation rate: Secondary | ICD-10-CM

## 2023-06-24 DIAGNOSIS — G8929 Other chronic pain: Secondary | ICD-10-CM

## 2023-06-24 MED ORDER — METHOTREXATE SODIUM 2.5 MG PO TABS
15.0000 mg | ORAL_TABLET | ORAL | 0 refills | Status: DC
Start: 2023-06-24 — End: 2023-12-23

## 2023-06-26 LAB — CBC WITH DIFFERENTIAL/PLATELET
Absolute Lymphocytes: 2083 {cells}/uL (ref 850–3900)
Absolute Monocytes: 763 {cells}/uL (ref 200–950)
Basophils Absolute: 33 {cells}/uL (ref 0–200)
Basophils Relative: 0.4 %
Eosinophils Absolute: 271 {cells}/uL (ref 15–500)
Eosinophils Relative: 3.3 %
HCT: 30.3 % — ABNORMAL LOW (ref 35.0–45.0)
Hemoglobin: 10.1 g/dL — ABNORMAL LOW (ref 11.7–15.5)
MCH: 31.2 pg (ref 27.0–33.0)
MCHC: 33.3 g/dL (ref 32.0–36.0)
MCV: 93.5 fL (ref 80.0–100.0)
MPV: 11.2 fL (ref 7.5–12.5)
Monocytes Relative: 9.3 %
Neutro Abs: 5051 {cells}/uL (ref 1500–7800)
Neutrophils Relative %: 61.6 %
Platelets: 387 10*3/uL (ref 140–400)
RBC: 3.24 10*6/uL — ABNORMAL LOW (ref 3.80–5.10)
RDW: 18.4 % — ABNORMAL HIGH (ref 11.0–15.0)
Total Lymphocyte: 25.4 %
WBC: 8.2 10*3/uL (ref 3.8–10.8)

## 2023-06-26 LAB — PROTEIN ELECTROPHORESIS, SERUM
Albumin ELP: 3.2 g/dL — ABNORMAL LOW (ref 3.8–4.8)
Alpha 1: 0.4 g/dL — ABNORMAL HIGH (ref 0.2–0.3)
Alpha 2: 0.9 g/dL (ref 0.5–0.9)
Beta 2: 0.5 g/dL (ref 0.2–0.5)
Beta Globulin: 0.4 g/dL (ref 0.4–0.6)
Gamma Globulin: 1.2 g/dL (ref 0.8–1.7)
Total Protein: 6.6 g/dL (ref 6.1–8.1)

## 2023-06-26 LAB — COMPLETE METABOLIC PANEL WITH GFR
AG Ratio: 1.2 (calc) (ref 1.0–2.5)
ALT: 10 U/L (ref 6–29)
AST: 12 U/L (ref 10–35)
Albumin: 3.6 g/dL (ref 3.6–5.1)
Alkaline phosphatase (APISO): 124 U/L (ref 37–153)
BUN: 8 mg/dL (ref 7–25)
CO2: 27 mmol/L (ref 20–32)
Calcium: 8.5 mg/dL — ABNORMAL LOW (ref 8.6–10.4)
Chloride: 106 mmol/L (ref 98–110)
Creat: 0.76 mg/dL (ref 0.60–1.00)
Globulin: 3 g/dL (ref 1.9–3.7)
Glucose, Bld: 87 mg/dL (ref 65–99)
Potassium: 4.7 mmol/L (ref 3.5–5.3)
Sodium: 140 mmol/L (ref 135–146)
Total Bilirubin: 0.3 mg/dL (ref 0.2–1.2)
Total Protein: 6.6 g/dL (ref 6.1–8.1)
eGFR: 81 mL/min/{1.73_m2} (ref >=60)

## 2023-06-26 LAB — SEDIMENTATION RATE: Sed Rate: 99 mm/h — ABNORMAL HIGH (ref 0–30)

## 2023-06-28 ENCOUNTER — Telehealth: Payer: Self-pay

## 2023-06-28 NOTE — Telephone Encounter (Signed)
Unable to reach patient.

## 2023-06-28 NOTE — Telephone Encounter (Signed)
Copied from CRM [id number]. Topic: Clinical - Lab/Test Results >> Jun 28, 2023 10:40 AM Jorje Guild R wrote: Reason for CRM: Wants to know results for Lab work that was done on 06/20/23

## 2023-08-20 ENCOUNTER — Encounter: Payer: Medicare Other | Admitting: Internal Medicine

## 2023-09-07 ENCOUNTER — Other Ambulatory Visit: Payer: Self-pay | Admitting: Internal Medicine

## 2023-09-07 DIAGNOSIS — K219 Gastro-esophageal reflux disease without esophagitis: Secondary | ICD-10-CM

## 2023-09-08 NOTE — Progress Notes (Signed)
 Office Visit Note  Patient: Laurie Wallace             Date of Birth: 21-Jan-1946           MRN: [id number]             PCP: Anabel Halon, MD Referring: Anabel Halon, MD Visit Date: 09/22/2023   Subjective:    Discussed the use of AI scribe software for clinical note transcription with the patient, who gave verbal consent to proceed.  History of Present Illness   Laurie Wallace is a 78 y.o. female here for follow up for seropositive RA on methotrexate 15 mg p.o. weekly and folic acid 1 mg daily.    She experiences significant pain in all her toes on both feet, severe enough to prevent wearing shoes or walking. This episode lasted for about two to three days. During this time, she was treated with a steroid and had a brief interruption of methotrexate for five days. No swelling in her toes or hands was noted during this episode. Outside of this episode, she manages joint pain and stiffness with over-the-counter options and has not required additional medication.  Her shoulder pain has improved and is not currently a significant issue. She has bone spurs on her heels, likely due to repetitive stress at the Achilles tendon attachment. These spurs are not currently causing significant problems.  She has been experiencing more frequent upper respiratory infections, raising concerns about potential immune suppression from her current methotrexate dosage.  She wants to return to work after being retired for a year since March 1st. She has been advised to avoid overexertion, particularly concerning her shoulder, and to consider a gradual return to work.  No swelling in her hands or toes during the recent episode of toe pain. She felt 'a little woozy' upon waking on a recent Sunday morning.       Previous HPI 06/24/2023 Laurie Wallace is a 78 y.o. female here for follow up for follow up for seropositive RA on methotrexate 15 mg p.o. weekly and folic acid  1 mg daily.  She was recently ill with pneumonia three weeks ago, but reports overall good health. They have noticed a contracture on the palm of their hand, which they describe as a little stiff at times but not painful. They also mention occasional soreness in their knee, but it does not seem to significantly bother them.   In terms of their rheumatoid arthritis, they report that it has been well-managed and they have not experienced significant pain or swelling.    Previous HPI 03/24/2023 Laurie Wallace is a 78 y.o. female here for follow up for seropositive RA on methotrexate 15 mg p.o. weekly and folic acid 1 mg daily.  At our last visit had discussed increasing medication due to increased symptoms especially in upper extremities and elevated sedimentation rate.  However she was sick with colitis with severe diarrhea weakness hypokalemia and interrupted treatment for this.  She resumed her methotrexate at 15 mg p.o. weekly after follow-up with PCP currently feels like her arthritis is doing well.  GI symptoms are mostly resolved she still has reduced appetite has been unable to regain any her unintentional weight loss.   Previous HPI 12/22/2022 Laurie Wallace is a 78 y.o. female here for follow up for seropositive RA on methotrexate 20 mg p.o. weekly and folic acid 1 mg daily.  Overall she feels symptoms are doing pretty well though still  describes some pain in both shoulders and stiffness in her hands in the mornings.  Some pain around the back of her neck and shoulders often more bothersome at night.  No flareup of joint swelling has not required any additional steroid treatments.  She denies any interval significant infections or antibiotics treatment.   Previous HPI 09/21/22 Laurie Wallace is a 78 y.o. female here for follow up for seropositive RA on methotrexate 20 mg p.o. weekly and folic acid 1 mg daily.  Symptoms were doing pretty well with some ongoing left  shoulder pain around baseline until acutely developed pain and swelling on the back of her right forearm and wrist at the end of December.  Did not recall any preceding illness no preceding injury to the site or change in usual activities.  Concern was for cellulitis versus RA flareup and treated with course of oral doxycycline and oral prednisone medication for 5 days.  She felt symptoms almost entirely cleared up after the first 2 days of treatment.  There is still been a small persistent swelling or nodule on the affected area ever since. Occasionally notices tenderness at the site but no pain with use.   Previous HPI 06/22/22 Laurie Wallace is a 78 y.o. female here for follow up for seropositive RA on MTX 20 mg PO weekly and folic acid 1 mg daily. She is doing well recently, slightly less busy with work so less lifting and shoulder pain is improved. Right upper arm still with some pain and stiffness. Not seeing any swelling and hand and knee pain is all doing well. Notices small increase in symptoms with the recent cooler weather usually just with the rain.   Previous HPI 02/26/22 Laurie Wallace is a 78 y.o. female here for follow up seropositive RA.  She has been off of the p.o. methotrexate for about a month.  She experienced increased joint pain in shoulders and knees off the medication.  She took a Medrol Dosepak which improved joint symptoms pretty quickly.   Previous HPI 10/13/2021 Laurie Wallace is a 78 y.o. female here for follow up for seropositive RA on methotrexate 20 mg PO weekly. She is doing well since last visit her shoulder pain increases on days with poor weather or if she uses her arms a lot more than usual. Otherwise pain free on some days. She had COVID infection last month treated with paxlovid and tessalon and tylenol with good improvement of symptoms.    Previous HPI 07/10/21 Laurie Wallace is a 78 y.o. female here for follow up for  seropositive RA on methotrexate 20 mg PO weekly which was increased after last visit.  She feels symptoms are overall improving she has 0 pain on some days occasionally swelling and stiffness affecting the left arm.  Left shoulder range of motion remains slightly reduced but with much less pain.  She denies any new side effects on increased methotrexate dose.  She has regained about 5 pounds since having some unintentional weight loss last year denies any GI complaints reports normal appetite.   Previous HPI   12/11/20 Laurie Wallace is a 78 y.o. female here for evaluation of joint pains in multiple areas with elevated inflammatory markers.  She believes these symptoms in her shoulder started since about 4 5 months ago without any specific preceding injury or event that she can recall.  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  She does not recall any past significant shoulder injuries or surgeries. Previous treatments tried include cymbalta that she does not notice a significant improvement.  Since last month she was started on oral daily prednisone for the past few weeks and has noticed a significant improvement in her joint symptoms particularly resolution of the hand swelling although does continue having some left shoulder pain.   LAbs reviewed 10/2020 ANA neg ACE neg RF neg ANCAs neg ESR 68 CRP 25 Alk phos 126 Plts 495 CK 55   Review of Systems  Constitutional:  Positive for fatigue.  HENT:  Negative for mouth sores and mouth dryness.   Eyes:  Negative for dryness.  Respiratory:  Positive for shortness of breath.   Cardiovascular:  Negative for chest pain and palpitations.  Gastrointestinal:  Negative for blood in stool, constipation and diarrhea.  Endocrine: Negative for increased urination.  Genitourinary:  Negative for involuntary urination.  Musculoskeletal:  Positive for  myalgias, morning stiffness, muscle tenderness and myalgias. Negative for joint pain, gait problem, joint pain, joint swelling and muscle weakness.  Skin:  Positive for sensitivity to sunlight. Negative for color change, rash and hair loss.  Allergic/Immunologic: Positive for susceptible to infections.  Neurological:  Positive for dizziness and light-headedness. Negative for headaches.  Hematological:  Negative for swollen glands.  Psychiatric/Behavioral:  Positive for sleep disturbance. Negative for depressed mood. The patient is nervous/anxious.     PMFS History:  Patient Active Problem List   Diagnosis Date Noted   Pulmonary nodule 1 cm or greater in diameter 06/02/2023   Community acquired pneumonia of left lower lobe of lung 06/02/2023   Microcytic anemia 02/17/2023   Hospital discharge follow-up 01/18/2023   Gastroesophageal reflux disease 01/18/2023   Gait disturbance 01/18/2023   Nonintractable episodic headache 01/18/2023   Infectious colitis 01/10/2023   Hypokalemia 01/10/2023   Thrombocytosis 01/10/2023   Bilateral shoulder pain 12/22/2022   Localized swelling of right forearm 09/21/2022   Subcutaneous cyst 07/20/2022   Allergic sinusitis 01/14/2022   Encounter for general adult medical examination with abnormal findings 07/17/2021   Age-related osteoporosis without current pathological fracture 07/17/2021   Muscle cramps 02/28/2021   High risk medication use 12/26/2020   Seropositive rheumatoid arthritis (HCC) 10/25/2020   Moderate protein-calorie malnutrition (HCC) 10/25/2020   Cervical spine pain 10/23/2020   Lumbar pain 10/23/2020   Other intervertebral disc displacement, lumbar region 04/07/2010    Past Medical History:  Diagnosis Date   Allergy    Arthritis    COPD (chronic obstructive pulmonary disease) (HCC)     Family History  Problem Relation Age of Onset   Cancer Mother    Cancer Father    Heart failure Sister    COPD Sister    Dementia Sister     Past Surgical History:  Procedure Laterality Date   ABDOMINAL HYSTERECTOMY     TOE SURGERY Right    1st toe   Social History   Social History Narrative   Lives with her son   Right Handed   Drinks 1-2 cups caffeine daily in winter   Immunization History  Administered Date(s) Administered   PNEUMOCOCCAL CONJUGATE-20 02/28/2021     Objective: Vital Signs: BP 135/76 (BP Location: Left Arm, Patient Position: Sitting, Cuff Size: Normal)   Pulse (!) 106   Resp 14   Ht 5\' 6"  (1.676 m)   Wt 117 lb 12.8 oz (53.4 kg)   BMI 19.01 kg/m    Physical  Exam Cardiovascular:     Rate and Rhythm: Regular rhythm. Tachycardia present.  Pulmonary:     Effort: Pulmonary effort is normal.     Breath sounds: Normal breath sounds.  Musculoskeletal:     Right lower leg: No edema.     Left lower leg: No edema.  Skin:    General: Skin is warm and dry.     Findings: No rash.  Neurological:     Mental Status: She is alert.  Psychiatric:        Mood and Affect: Mood normal.      Musculoskeletal Exam:  Shoulders full ROM no tenderness or swelling Wrists full ROM no tenderness or swelling Fingers right first MCP severely hyperextended, b/l 1st CMC arthritis,  Heberden's nodes bilaterally, no palpable synovitis Mild Dupuytren's contracture worse proximal to right third digit Knees full ROM no tenderness or swelling Ankles full ROM, posterior heel bony nodules bilateral without tenderness or swelling Bilateral first MTP bunions with lateral deviation right worse than left   Investigation: No additional findings.  Imaging: No results found.  Recent Labs: Lab Results  Component Value Date   WBC 8.2 06/24/2023   HGB 10.1 (L) 06/24/2023   PLT 387 06/24/2023   NA 140 06/24/2023   K 4.7 06/24/2023   CL 106 06/24/2023   CO2 27 06/24/2023   GLUCOSE 87 06/24/2023   BUN 8 06/24/2023   CREATININE 0.76 06/24/2023   BILITOT 0.3 06/24/2023   ALKPHOS 113 01/29/2023   AST 12 06/24/2023    ALT 10 06/24/2023   PROT 6.6 06/24/2023   PROT 6.6 06/24/2023   ALBUMIN 3.8 01/29/2023   CALCIUM 8.5 (L) 06/24/2023   GFRAA 88 09/28/2014    Speciality Comments: No specialty comments available.  Procedures:  No procedures performed Allergies: Patient has no known allergies.   Assessment / Plan:     Visit Diagnoses: Seropositive rheumatoid arthritis (HCC) - Plan: Sedimentation rate Joint inflammation appears well controlled. One episode worse during acute illness on methotrexate. -Checking sed rate disease activity monitoring -Continue methotrexate 15 mg PO weekly  High risk medication use - methotrexate 15 mg PO weekly folic acid 1 mg daily - Plan: CBC with Differential/Platelet, COMPLETE METABOLIC PANEL WITH GFR Frequent upper respiratory infections Increased infections possibly due to methotrexate-induced immunosuppression. Plan to review labs for immune function assessment. - Review lab results to assess immune function. - Consider decreasing methotrexate dosage if well controlled inflammation but immune function is compromised.   Dupuytren's contracture - Continue with regular hand and wrist stretches  Shoulder pain Chronic shoulder pain well-controlled. Advised to avoid overexertion and adjust posture to prevent recurrence and anterior shoulder strain. - Advise to avoid overexertion.    Orders: Orders Placed This Encounter  Procedures   Sedimentation rate   CBC with Differential/Platelet   COMPLETE METABOLIC PANEL WITH GFR   No orders of the defined types were placed in this encounter.    Follow-Up Instructions: Return in about 3 months (around 12/23/2023) for RA on MTX f/u 3mos.   Fuller Plan, MD  Note - This record has been created using [employer].  Chart creation errors have been sought, but may not always  have been located. Such creation errors do not reflect on  the standard of medical care.

## 2023-09-10 DIAGNOSIS — R051 Acute cough: Secondary | ICD-10-CM | POA: Diagnosis not present

## 2023-09-10 DIAGNOSIS — M79674 Pain in right toe(s): Secondary | ICD-10-CM | POA: Diagnosis not present

## 2023-09-10 DIAGNOSIS — B9689 Other specified bacterial agents as the cause of diseases classified elsewhere: Secondary | ICD-10-CM | POA: Diagnosis not present

## 2023-09-10 DIAGNOSIS — M069 Rheumatoid arthritis, unspecified: Secondary | ICD-10-CM | POA: Diagnosis not present

## 2023-09-10 DIAGNOSIS — Z03818 Encounter for observation for suspected exposure to other biological agents ruled out: Secondary | ICD-10-CM | POA: Diagnosis not present

## 2023-09-10 DIAGNOSIS — J019 Acute sinusitis, unspecified: Secondary | ICD-10-CM | POA: Diagnosis not present

## 2023-09-10 DIAGNOSIS — M79675 Pain in left toe(s): Secondary | ICD-10-CM | POA: Diagnosis not present

## 2023-09-22 ENCOUNTER — Encounter: Payer: Self-pay | Admitting: Internal Medicine

## 2023-09-22 ENCOUNTER — Ambulatory Visit: Payer: Medicare HMO | Attending: Internal Medicine | Admitting: Internal Medicine

## 2023-09-22 VITALS — BP 135/76 | HR 106 | Resp 14 | Ht 66.0 in | Wt 117.8 lb

## 2023-09-22 DIAGNOSIS — M720 Palmar fascial fibromatosis [Dupuytren]: Secondary | ICD-10-CM

## 2023-09-22 DIAGNOSIS — R700 Elevated erythrocyte sedimentation rate: Secondary | ICD-10-CM | POA: Diagnosis not present

## 2023-09-22 DIAGNOSIS — M059 Rheumatoid arthritis with rheumatoid factor, unspecified: Secondary | ICD-10-CM | POA: Diagnosis not present

## 2023-09-22 DIAGNOSIS — Z79899 Other long term (current) drug therapy: Secondary | ICD-10-CM

## 2023-09-23 DIAGNOSIS — D3132 Benign neoplasm of left choroid: Secondary | ICD-10-CM | POA: Diagnosis not present

## 2023-09-23 DIAGNOSIS — H25813 Combined forms of age-related cataract, bilateral: Secondary | ICD-10-CM | POA: Diagnosis not present

## 2023-09-23 DIAGNOSIS — H5203 Hypermetropia, bilateral: Secondary | ICD-10-CM | POA: Diagnosis not present

## 2023-09-23 LAB — COMPLETE METABOLIC PANEL WITH GFR
AG Ratio: 1.4 (calc) (ref 1.0–2.5)
ALT: 17 U/L (ref 6–29)
AST: 18 U/L (ref 10–35)
Albumin: 4 g/dL (ref 3.6–5.1)
Alkaline phosphatase (APISO): 113 U/L (ref 37–153)
BUN: 9 mg/dL (ref 7–25)
CO2: 26 mmol/L (ref 20–32)
Calcium: 9 mg/dL (ref 8.6–10.4)
Chloride: 105 mmol/L (ref 98–110)
Creat: 0.85 mg/dL (ref 0.60–1.00)
Globulin: 2.8 g/dL (ref 1.9–3.7)
Glucose, Bld: 102 mg/dL — ABNORMAL HIGH (ref 65–99)
Potassium: 4.3 mmol/L (ref 3.5–5.3)
Sodium: 142 mmol/L (ref 135–146)
Total Bilirubin: 0.3 mg/dL (ref 0.2–1.2)
Total Protein: 6.8 g/dL (ref 6.1–8.1)

## 2023-09-23 LAB — CBC WITH DIFFERENTIAL/PLATELET
Absolute Lymphocytes: 2493 {cells}/uL (ref 850–3900)
Absolute Monocytes: 1597 {cells}/uL — ABNORMAL HIGH (ref 200–950)
Basophils Absolute: 36 {cells}/uL (ref 0–200)
Basophils Relative: 0.3 %
Eosinophils Absolute: 448 {cells}/uL (ref 15–500)
Eosinophils Relative: 3.7 %
HCT: 32 % — ABNORMAL LOW (ref 35.0–45.0)
Hemoglobin: 10.6 g/dL — ABNORMAL LOW (ref 11.7–15.5)
MCH: 32.1 pg (ref 27.0–33.0)
MCHC: 33.1 g/dL (ref 32.0–36.0)
MCV: 97 fL (ref 80.0–100.0)
MPV: 12.1 fL (ref 7.5–12.5)
Monocytes Relative: 13.2 %
Neutro Abs: 7526 {cells}/uL (ref 1500–7800)
Neutrophils Relative %: 62.2 %
Platelets: 167 10*3/uL (ref 140–400)
RBC: 3.3 10*6/uL — ABNORMAL LOW (ref 3.80–5.10)
RDW: 18.6 % — ABNORMAL HIGH (ref 11.0–15.0)
Total Lymphocyte: 20.6 %
WBC: 12.1 10*3/uL — ABNORMAL HIGH (ref 3.8–10.8)

## 2023-09-23 LAB — SEDIMENTATION RATE: Sed Rate: 92 mm/h — ABNORMAL HIGH (ref 0–30)

## 2023-09-23 NOTE — Progress Notes (Signed)
 Blood count and kidney and liver function tests are fine for continuing the current methotrexate.  Her sedimentation rate test is 92 which is better than last time but still elevated so would not decrease the methotrexate dose.

## 2023-09-29 ENCOUNTER — Other Ambulatory Visit: Payer: Self-pay

## 2023-09-29 ENCOUNTER — Encounter (HOSPITAL_COMMUNITY): Payer: Self-pay

## 2023-09-29 ENCOUNTER — Ambulatory Visit: Payer: Self-pay

## 2023-09-29 ENCOUNTER — Observation Stay (HOSPITAL_COMMUNITY)
Admission: EM | Admit: 2023-09-29 | Discharge: 2023-09-30 | Disposition: A | Discharge status: Home / Self Care | Attending: Family Medicine | Admitting: Family Medicine

## 2023-09-29 ENCOUNTER — Emergency Department (HOSPITAL_COMMUNITY)

## 2023-09-29 DIAGNOSIS — D72829 Elevated white blood cell count, unspecified: Secondary | ICD-10-CM | POA: Insufficient documentation

## 2023-09-29 DIAGNOSIS — I2694 Multiple subsegmental pulmonary emboli without acute cor pulmonale: Principal | ICD-10-CM

## 2023-09-29 DIAGNOSIS — D75839 Thrombocytosis, unspecified: Secondary | ICD-10-CM | POA: Insufficient documentation

## 2023-09-29 DIAGNOSIS — Z79899 Other long term (current) drug therapy: Secondary | ICD-10-CM | POA: Diagnosis not present

## 2023-09-29 DIAGNOSIS — M059 Rheumatoid arthritis with rheumatoid factor, unspecified: Secondary | ICD-10-CM | POA: Diagnosis not present

## 2023-09-29 DIAGNOSIS — R911 Solitary pulmonary nodule: Secondary | ICD-10-CM | POA: Diagnosis not present

## 2023-09-29 DIAGNOSIS — I2609 Other pulmonary embolism with acute cor pulmonale: Secondary | ICD-10-CM | POA: Diagnosis not present

## 2023-09-29 DIAGNOSIS — R0789 Other chest pain: Secondary | ICD-10-CM | POA: Diagnosis not present

## 2023-09-29 DIAGNOSIS — R079 Chest pain, unspecified: Secondary | ICD-10-CM | POA: Diagnosis not present

## 2023-09-29 DIAGNOSIS — J90 Pleural effusion, not elsewhere classified: Secondary | ICD-10-CM | POA: Diagnosis not present

## 2023-09-29 DIAGNOSIS — K219 Gastro-esophageal reflux disease without esophagitis: Secondary | ICD-10-CM | POA: Diagnosis not present

## 2023-09-29 DIAGNOSIS — J9811 Atelectasis: Secondary | ICD-10-CM | POA: Diagnosis not present

## 2023-09-29 DIAGNOSIS — I2699 Other pulmonary embolism without acute cor pulmonale: Secondary | ICD-10-CM | POA: Diagnosis present

## 2023-09-29 LAB — CBC WITH DIFFERENTIAL/PLATELET
Abs Immature Granulocytes: 1.5 10*3/uL — ABNORMAL HIGH (ref 0.00–0.07)
Basophils Absolute: 0 10*3/uL (ref 0.0–0.1)
Basophils Relative: 0 %
Eosinophils Absolute: 0.4 10*3/uL (ref 0.0–0.5)
Eosinophils Relative: 3 %
HCT: 34 % — ABNORMAL LOW (ref 36.0–46.0)
Hemoglobin: 11.4 g/dL — ABNORMAL LOW (ref 12.0–15.0)
Lymphocytes Relative: 16 %
Lymphs Abs: 2.2 10*3/uL (ref 0.7–4.0)
MCH: 32.9 pg (ref 26.0–34.0)
MCHC: 33.5 g/dL (ref 30.0–36.0)
MCV: 98.3 fL (ref 80.0–100.0)
Metamyelocytes Relative: 2 %
Monocytes Absolute: 2.2 10*3/uL — ABNORMAL HIGH (ref 0.1–1.0)
Monocytes Relative: 16 %
Myelocytes: 5 %
Neutro Abs: 7.4 10*3/uL (ref 1.7–7.7)
Neutrophils Relative %: 54 %
Platelets: 480 10*3/uL — ABNORMAL HIGH (ref 150–400)
Promyelocytes Relative: 4 %
RBC: 3.46 MIL/uL — ABNORMAL LOW (ref 3.87–5.11)
RDW: 20.3 % — ABNORMAL HIGH (ref 11.5–15.5)
WBC: 13.7 10*3/uL — ABNORMAL HIGH (ref 4.0–10.5)
nRBC: 0 % (ref 0.0–0.2)

## 2023-09-29 LAB — COMPREHENSIVE METABOLIC PANEL
ALT: 21 U/L (ref 0–44)
AST: 24 U/L (ref 15–41)
Albumin: 3.7 g/dL (ref 3.5–5.0)
Alkaline Phosphatase: 112 U/L (ref 38–126)
Anion gap: 12 (ref 5–15)
BUN: 11 mg/dL (ref 8–23)
CO2: 25 mmol/L (ref 22–32)
Calcium: 9.4 mg/dL (ref 8.9–10.3)
Chloride: 102 mmol/L (ref 98–111)
Creatinine, Ser: 0.82 mg/dL (ref 0.44–1.00)
GFR, Estimated: >60 mL/min (ref >60)
Glucose, Bld: 109 mg/dL — ABNORMAL HIGH (ref 70–99)
Potassium: 4.5 mmol/L (ref 3.5–5.1)
Sodium: 139 mmol/L (ref 135–145)
Total Bilirubin: 0.3 mg/dL (ref 0.0–1.2)
Total Protein: 7.6 g/dL (ref 6.5–8.1)

## 2023-09-29 LAB — PROTIME-INR
INR: 1.1 (ref 0.8–1.2)
Prothrombin Time: 14.7 s (ref 11.4–15.2)

## 2023-09-29 LAB — TROPONIN I (HIGH SENSITIVITY)
Troponin I (High Sensitivity): 4 ng/L (ref <18)
Troponin I (High Sensitivity): 4 ng/L (ref <18)

## 2023-09-29 MED ORDER — IOHEXOL 350 MG/ML SOLN
75.0000 mL | Freq: Once | INTRAVENOUS | Status: AC | PRN
Start: 2023-09-29 — End: 2023-09-29
  Administered 2023-09-29: 75 mL via INTRAVENOUS

## 2023-09-29 MED ORDER — FOLIC ACID 1 MG PO TABS
1.0000 mg | ORAL_TABLET | Freq: Every day | ORAL | Status: DC
  Administered 2023-09-29 – 2023-09-30 (×2): 1 mg via ORAL
  Filled 2023-09-29 (×2): qty 1

## 2023-09-29 MED ORDER — FERROUS SULFATE 325 (65 FE) MG PO TABS
325.0000 mg | ORAL_TABLET | Freq: Every day | ORAL | Status: DC
  Administered 2023-09-29 – 2023-09-30 (×2): 325 mg via ORAL
  Filled 2023-09-29 (×2): qty 1

## 2023-09-29 MED ORDER — ACETAMINOPHEN 650 MG RE SUPP: 650.0000 mg | Freq: Four times a day (QID) | RECTAL | Status: DC | PRN

## 2023-09-29 MED ORDER — ACETAMINOPHEN 325 MG PO TABS: 650.0000 mg | ORAL_TABLET | Freq: Four times a day (QID) | ORAL | Status: DC | PRN

## 2023-09-29 MED ORDER — APIXABAN 5 MG PO TABS: 5.0000 mg | ORAL_TABLET | Freq: Two times a day (BID) | ORAL | Status: DC

## 2023-09-29 MED ORDER — APIXABAN 5 MG PO TABS
10.0000 mg | ORAL_TABLET | Freq: Two times a day (BID) | ORAL | Status: DC
  Administered 2023-09-29 – 2023-09-30 (×2): 10 mg via ORAL
  Filled 2023-09-29 (×2): qty 2

## 2023-09-29 NOTE — ED Provider Notes (Signed)
 [place] EMERGENCY DEPARTMENT AT [facility] Provider Note   CSN: [id number] Arrival date & time: 09/29/23  1631     History {Add pertinent medical, surgical, social history, OB history to HPI:1} Chief Complaint  Patient presents with   Chest Pain    Laurie Wallace is a 78 y.o. female with PMH as listed below who presents via POV from home after calling PCP office and being advised to seek evaluation in the ER for right upper quadrant abdominal/right lower chest pain as well as right thoracic back pain "under her shoulder blade." Has been going on since this past weekend. No pain at rest but does have pain when she takes a deep breath. No h/o similar. No h/o trauma.  Denies fever/chills, cough, shortness of breath, vomiting diarrhea.  She does endorse mild nausea.  Pain is not exertional.  No recent travel, hospitalizations, surgeries.  No history of DVT or PE.  Does not take a blood thinner.  Only abdominal surgical history is hysterectomy.  Past Medical History:  Diagnosis Date   Allergy    Arthritis    COPD (chronic obstructive pulmonary disease) (HCC)        Home Medications Prior to Admission medications   Medication Sig Start Date End Date Taking? Authorizing Provider  alendronate (FOSAMAX) 70 MG tablet Take 1 tablet (70 mg total) by mouth every 7 (seven) days. Take with a full glass of water on an empty stomach. Patient taking differently: Take 70 mg by mouth every Monday. 01/14/22   Anabel Halon, MD  azelastine (ASTELIN) 0.1 % nasal spray Place 2 sprays into both nostrils 2 (two) times daily. Use in each nostril as directed 06/02/23   Anabel Halon, MD  famotidine (PEPCID) 40 MG tablet TAKE ONE TABLET BY MOUTH EVERY DAY 09/07/23   Anabel Halon, MD  folic acid (FOLVITE) 1 MG tablet Take 1 tablet (1 mg total) by mouth daily. 12/22/22   Fuller Plan, MD  Iron, Ferrous Sulfate, 325 (65 Fe) MG TABS Take 325 mg by mouth daily. 02/17/23   Anabel Halon, MD  levocetirizine (XYZAL) 5 MG tablet Take 1 tablet (5 mg total) by mouth every evening. 04/29/23   Del Nigel Berthold, FNP  methotrexate (RHEUMATREX) 2.5 MG tablet Take 6 tablets (15 mg total) by mouth once a week. Caution:Chemotherapy. Protect from light. 06/24/23   Rice, Jamesetta Orleans, MD  ondansetron (ZOFRAN) 4 MG tablet Take 1 tablet (4 mg total) by mouth every 8 (eight) hours as needed for nausea or vomiting. 01/18/23   Anabel Halon, MD  pseudoephedrine-codeine-guaifenesin (MYTUSSIN DAC) 30-10-100 MG/5ML solution Take 10 mLs by mouth 4 (four) times daily as needed for cough. Patient not taking: Reported on 09/22/2023 06/02/23   Anabel Halon, MD  brompheniramine (VAZOL) 2 MG/5ML LIQD Take 2 mg by mouth 2 (two) times daily.  07/23/20  [provider]  fluticasone (FLONASE) 50 MCG/ACT nasal spray Place 2 sprays into the nose daily.  07/23/20  [provider]      Allergies    Patient has no known allergies.    Review of Systems   Review of Systems A 10 point review of systems was performed and is negative unless otherwise reported in HPI.  Physical Exam Updated Vital Signs BP 96/68 (BP Location: Right Arm)   Pulse (!) 122   Temp 99 F (37.2 C) (Oral)   Resp 16   Ht 5\' 6"  (1.676 m)  Wt 58 kg   SpO2 98%   BMI 20.64 kg/m  Physical Exam General: Normal appearing female, lying in bed.  HEENT: PERRLA, Sclera anicteric, MMM, trachea midline.  Cardiology: RRR, no murmurs/rubs/gallops. No chest wall TTP.  Resp: Normal respiratory rate and effort. CTAB, no wheezes, rhonchi, crackles.  Abd: Soft, non-tender, non-distended. Murphy's negative. No rebound tenderness or guarding.  GU: Deferred. MSK: No peripheral edema or signs of trauma. Extremities without deformity or TTP. No cyanosis or clubbing. Skin: warm, dry. No rashes or lesions. Back: No CVA tenderness.  No midline CT or L-spine tenderness.  Mild right thoracic paraspinal muscle  tenderness. Neuro: A&Ox4, CNs II-XII grossly intact. MAEs. Sensation grossly intact.  Psych: Normal mood and affect.   ED Results / Procedures / Treatments   Labs (all labs ordered are listed, but only abnormal results are displayed) Labs Reviewed  COMPREHENSIVE METABOLIC PANEL  CBC WITH DIFFERENTIAL/PLATELET  TROPONIN I (HIGH SENSITIVITY)    EKG None  Radiology No results found.  Procedures Procedures  {Document cardiac monitor, telemetry assessment procedure when appropriate:1}  Medications Ordered in ED Medications - No data to display  ED Course/ Medical Decision Making/ A&P                          Medical Decision Making Amount and/or Complexity of Data Reviewed Labs: ordered. Decision-making details documented in ED Course. Radiology: ordered.    This patient presents to the ED for concern of R lower chest/RUQ pain, R upper back pain; this involves an extensive number of treatment options, and is a complaint that carries with it a high risk of complications and morbidity.  I considered the following differential and admission for this acute, potentially life threatening condition.   MDM:    DDX for chest pain includes but is not limited to:  Patient presents with mild tachycardia, mild tachypnea, and a temperature 99 degrees.  This might be more concerning for an infectious cause of her symptoms such as pneumonia, parapneumonic effusion, or acute cholecystitis given her report of pain underneath her right rib cage.  She does not have right upper quadrant tenderness palpation is Murphy's negative which is reassuring but will still obtain right upper quadrant ultrasound.  Her pain worse with inspiration is also concerning for pulmonary embolism.  Patient has no history of this, will obtain CT PE. Consider also musculoskeletal pain, does have paraspinal TTP. No midline Ttp to indicate vertebral fracture, no report of trauma.   Clinical Course as of 09/29/23 1820  Wed  Sep 29, 2023  1814 Troponin I (High Sensitivity): 4 neg [HN]  1814 Comprehensive metabolic panel(!) neg [HN]    Clinical Course User Index [HN] Loetta Rough, MD    Labs: I Ordered, and personally interpreted labs.  The pertinent results include: Those listed above  Imaging Studies ordered: I ordered imaging studies including CT PE, right upper quadrant ultrasound I independently visualized and interpreted imaging. I agree with the radiologist interpretation  Additional history obtained from chart review.    Cardiac Monitoring: The patient was maintained on a cardiac monitor.  I personally viewed and interpreted the cardiac monitored which showed an underlying rhythm of: Normal sinus rhythm  Reevaluation: After the interventions noted above, I reevaluated the patient and found that they have :{resolved/improved/worsened:23923::"improved"}  Social Determinants of Health:  lives independently  Disposition:  ***  Co morbidities that complicate the patient evaluation  Past Medical History:  Diagnosis Date  Allergy    Arthritis    COPD (chronic obstructive pulmonary disease) (HCC)      Medicines No orders of the defined types were placed in this encounter.   I have reviewed the patients home medicines and have made adjustments as needed  Problem List / ED Course: Problem List Items Addressed This Visit   None        {Document critical care time when appropriate:1} {Document review of labs and clinical decision tools ie heart score, Chads2Vasc2 etc:1}  {Document your independent review of radiology images, and any outside records:1} {Document your discussion with family members, caretakers, and with consultants:1} {Document social determinants of health affecting pt's care:1} {Document your decision making why or why not admission, treatments were needed:1}  This note was created using dictation software, which may contain spelling or grammatical errors.

## 2023-09-29 NOTE — ED Triage Notes (Signed)
 Pt arrived via POV from home after calling PCP office and being advised to seek evaluation in the ER for bilateral chest discomfort and back pain. Pt reports pain is worse when she takes a deep breath.

## 2023-09-29 NOTE — Telephone Encounter (Signed)
 Chief Complaint: Chest Pain Symptoms:  Frequency: x 2 days Pertinent Negatives: Patient denies fever, N/V, sweating Disposition: [x] ED /[] Urgent Care (no appt availability in office) / [] Appointment(In office/virtual)/ []  [facility]/ [] Home Care/ [] Refused Recommended Disposition /[] [facility]/ []  Follow-up with PCP Additional Notes: Pt reports she has new onset CP that moves from right under her shoulder blade/rib cage from right to left. Pt reports the pain is new, pain is 9/10 with inspiration and pt notes new onset SOB, especially with exertion. ED advised per protocol. Pt advised she will call family for ride to ED and will call for follow up once discharged. This RN educated pt on home care, new-worsening symptoms, when to call back/seek emergent care. Pt verbalized understanding and agrees to plan.    Copied from CRM [id number]. Topic: Clinical - Red Word Triage >> Sep 29, 2023  2:52 PM Priscille Loveless wrote: Red Word that prompted transfer to Nurse Triage: Extreme pain, arthritis but started hurting all of a sudden. Under right shoulder and ribs and shob. Reason for Disposition  Taking a deep breath makes pain worse  Answer Assessment - Initial Assessment Questions 1. LOCATION: "Where does it hurt?"       Under shoulder blade/under ribs intermittent and moves between both sides 2. RADIATION: "Does the pain go anywhere else?" (e.g., into neck, jaw, arms, back)     Sometimes radiates to jaw and shoulder on right 3. ONSET: "When did the chest pain begin?" (Minutes, hours or days)      X 2 days 4. PATTERN: "Does the pain come and go, or has it been constant since it started?"  "Does it get worse with exertion?"      Intermittent 5. DURATION: "How long does it last" (e.g., seconds, minutes, hours)     Pt unable to articulate 6. SEVERITY: "How bad is the pain?"  (e.g., Scale 1-10; mild, moderate, or severe)    - MILD (1-3): doesn't interfere with normal activities     -  MODERATE (4-7): interferes with normal activities or awakens from sleep    - SEVERE (8-10): excruciating pain, unable to do any normal activities       8/10 with inspiration, 5/10 at rest  9. CAUSE: "What do you think is causing the chest pain?"     Pt believes it is related to her arthritis 10. OTHER SYMPTOMS: "Do you have any other symptoms?" (e.g., dizziness, nausea, vomiting, sweating, fever, difficulty breathing, cough)       SOB with exertion is worsened, woozy  Protocols used: Chest Pain-A-AH

## 2023-09-29 NOTE — ED Notes (Signed)
 Family updated as to patient's status, approved by patient.

## 2023-09-29 NOTE — ED Notes (Signed)
 Please call Laurie Wallace first with updates, she is in [place] where the other contacts are further out.

## 2023-09-29 NOTE — Care Management Obs Status (Signed)
 MEDICARE OBSERVATION STATUS NOTIFICATION   Patient Details  Name: Laurie Wallace MRN: [id number] Date of Birth: 11/17/1945   Medicare Observation Status Notification Given:  Yes    Barron Alvine, RN 09/29/2023, 10:26 PM

## 2023-09-29 NOTE — Progress Notes (Signed)
   09/29/23 2256  TOC Brief Assessment  Insurance and Status Reviewed  Patient has primary care physician Yes  Home environment has been reviewed From home alone.  Prior level of function: Independent.  Prior/Current Home Services No current home services  Social Drivers of Health Review SDOH reviewed no interventions necessary  Readmission risk has been reviewed Yes  Transition of care needs no transition of care needs at this time   Transition of Care Department [facility]) has reviewed patient and no other TOC needs have been identified at this time. We will continue to monitor patient advancement through interdisciplinary progression rounds. If new patient needs arise, please place a TOC consult.

## 2023-09-29 NOTE — H&P (Signed)
 [facility] H&P   Patient Demographics:    Laurie Wallace, is a 78 y.o. female  MRN: [id number]   DOB - July 09, 1945  Admit Date - 09/29/2023  Outpatient Primary MD for the patient is Anabel Halon, MD  Referring MD/NP/PA: Dr Jearld Fenton  Patient coming from: home  Chief Complaint  Patient presents with   Chest Pain      HPI:    Laurie Wallace  is a 78 y.o. female, with past medical history of COPD, rheumatoid arthritis, she presents to ED secondary to complaints of chest pain, patient was directed to come to ED after calling PCP for evaluation of chest pain, patient report right upper quadrant abdominal pain, right lower chest pain, as well as right thoracic back pain, under her shoulder blade, reported has been ongoing since past weekend, has any lower extremity edema, hemoptysis, coughing blood, nausea, vomiting, as well reports chest pain worsening by deep breath, no history of trauma, shortness of breath, vomiting or diarrhea, no recent travel, leg trauma or prolonged immobility -In ED CTA chest was obtained, significant for multiple subsegmental PEs, she was noted to be tachycardic, but troponins within normal limit, no hypoxia, she was started on Eliquis and [facility] hospital[facility] consulted to admit for PE    Review of systems:    .  A full 10 point Review of Systems was done, except as stated above, all other Review of Systems were negative.   With Past History of the following :    Past Medical History:  Diagnosis Date   Allergy    Arthritis    COPD (chronic obstructive pulmonary disease) (HCC)       Past Surgical History:  Procedure Laterality Date   ABDOMINAL HYSTERECTOMY     TOE SURGERY Right    1st toe      Social History:     Social History   Tobacco Use   Smoking status: Never    Passive exposure: Never   Smokeless tobacco: Never  Substance Use  Topics   Alcohol use: No       Family History :     Family History  Problem Relation Age of Onset   Cancer Mother    Cancer Father    Heart failure Sister    COPD Sister    Dementia Sister       Home Medications:   Prior to Admission medications   Medication Sig Start Date End Date Taking? Authorizing Provider  alendronate (FOSAMAX) 70 MG tablet Take 1 tablet (70 mg total) by mouth every 7 (seven) days. Take with a full glass of water on an empty stomach. Patient taking differently: Take 70 mg by mouth every Monday. 01/14/22  Yes Anabel Halon, MD  famotidine (PEPCID) 40 MG tablet TAKE ONE TABLET BY MOUTH EVERY DAY 09/07/23  Yes Anabel Halon, MD  folic acid (FOLVITE) 1 MG tablet  Take 1 tablet (1 mg total) by mouth daily. 12/22/22  Yes Rice, Laurie Orleans, MD  Iron, Ferrous Sulfate, 325 (65 Fe) MG TABS Take 325 mg by mouth daily. 02/17/23  Yes Anabel Halon, MD  levocetirizine (XYZAL) 5 MG tablet Take 1 tablet (5 mg total) by mouth every evening. 04/29/23  Yes Del Newman Nip, Tenna Child, FNP  methotrexate (RHEUMATREX) 2.5 MG tablet Take 6 tablets (15 mg total) by mouth once a week. Caution:Chemotherapy. Protect from light. 06/24/23  Yes Rice, Laurie Orleans, MD  brompheniramine (VAZOL) 2 MG/5ML LIQD Take 2 mg by mouth 2 (two) times daily.  07/23/20  [provider]  fluticasone (FLONASE) 50 MCG/ACT nasal spray Place 2 sprays into the nose daily.  07/23/20  [provider]     Allergies:    No Known Allergies   Physical Exam:   Vitals  Blood pressure 125/73, pulse (!) 104, temperature 99 F (37.2 C), temperature source Oral, resp. rate (!) 25, height 5\' 6"  (1.676 m), weight 58 kg, SpO2 98%.   1. General Elderly female, laying in bed, no apparent distress  2. Normal affect and insight, Not Suicidal or Homicidal, Awake Alert, Oriented X 3.  3. No F.N deficits, ALL C.Nerves Intact, Strength 5/5 all 4 extremities, Sensation intact all 4 extremities, Plantars  down going.  4. Ears and Eyes appear Normal, Conjunctivae clear, PERRLA. Moist Oral Mucosa.  5. Supple Neck, No JVD, No cervical lymphadenopathy appriciated, No Carotid Bruits.  6. Symmetrical Chest wall movement, Good air movement bilaterally, CTAB.  7. RRR, No Gallops, Rubs or Murmurs, No Parasternal Heave.  8. Positive Bowel Sounds, Abdomen Soft, No tenderness, No organomegaly appriciated,No rebound -guarding or rigidity.  9.  No Cyanosis, Normal Skin Turgor, No Skin Rash or Bruise.  10. Good muscle tone,  joints appear normal , no effusions, Normal ROM.    Data Review:    CBC Recent Labs  Lab 09/29/23 1739  WBC 13.7*  HGB 11.4*  HCT 34.0*  PLT 480*  MCV 98.3  MCH 32.9  MCHC 33.5  RDW 20.3*  LYMPHSABS 2.2  MONOABS 2.2*  EOSABS 0.4  BASOSABS 0.0   ------------------------------------------------------------------------------------------------------------------  Chemistries  Recent Labs  Lab 09/29/23 1739  NA 139  K 4.5  CL 102  CO2 25  GLUCOSE 109*  BUN 11  CREATININE 0.82  CALCIUM 9.4  AST 24  ALT 21  ALKPHOS 112  BILITOT 0.3   ------------------------------------------------------------------------------------------------------------------ estimated creatinine clearance is 52.6 mL/min (by C-G formula based on SCr of 0.82 mg/dL). ------------------------------------------------------------------------------------------------------------------ No results for input(s): "TSH", "T4TOTAL", "T3FREE", "THYROIDAB" in the last 72 hours.  Invalid input(s): "FREET3"  Coagulation profile Recent Labs  Lab 09/29/23 1739  INR 1.1   ------------------------------------------------------------------------------------------------------------------- No results for input(s): "DDIMER" in the last 72 hours. -------------------------------------------------------------------------------------------------------------------  Cardiac Enzymes No results for input(s):  "CKMB", "TROPONINI", "MYOGLOBIN" in the last 168 hours.  Invalid input(s): "CK" ------------------------------------------------------------------------------------------------------------------ No results found for: "BNP"   ---------------------------------------------------------------------------------------------------------------  Urinalysis    Component Value Date/Time   COLORURINE YELLOW 01/09/2023 2132   APPEARANCEUR CLEAR 01/09/2023 2132   LABSPEC 1.016 01/09/2023 2132   PHURINE 6.0 01/09/2023 2132   GLUCOSEU >=500 (A) 01/09/2023 2132   HGBUR SMALL (A) 01/09/2023 2132   BILIRUBINUR NEGATIVE 01/09/2023 2132   KETONESUR 5 (A) 01/09/2023 2132   PROTEINUR 100 (A) 01/09/2023 2132   NITRITE NEGATIVE 01/09/2023 2132   LEUKOCYTESUR TRACE (A) 01/09/2023 2132    ----------------------------------------------------------------------------------------------------------------   Imaging Results:    CT Angio  Chest PE W and/or Wo Contrast Result Date: 09/29/2023 CLINICAL DATA:  Bilateral chest discomfort EXAM: CT ANGIOGRAPHY CHEST WITH CONTRAST TECHNIQUE: Multidetector CT imaging of the chest was performed using the standard protocol during bolus administration of intravenous contrast. Multiplanar CT image reconstructions and MIPs were obtained to evaluate the vascular anatomy. RADIATION DOSE REDUCTION: This exam was performed according to the departmental dose-optimization program which includes automated exposure control, adjustment of the mA and/or kV according to patient size and/or use of iterative reconstruction technique. CONTRAST:  75mL OMNIPAQUE IOHEXOL 350 MG/ML SOLN COMPARISON:  Chest x-ray 09/29/2023, CT chest 06/20/2023 FINDINGS: Cardiovascular: Satisfactory opacification of the pulmonary arteries to the segmental level. Positive for acute bilateral lower lobe right greater than left segmental and subsegmental PE. Nonaneurysmal aorta. No dissection. Irregular mural thrombus in the  descending thoracic aorta. Normal cardiac size. No pericardial effusion Mediastinum/Nodes: Patent trachea. No thyroid mass. No suspicious lymph nodes. Esophagus within normal limits. Lungs/Pleura: No pleural effusion or pneumothorax. Apical pleuroparenchymal scarring. New focus of subpleural right apical anterior airspace disease, series 6, image 24. Subpleural right lower lobe nodular focus on series 6 image 83 measures 7 mm compared with 7 mm previously, partially obscured by surrounding dependent atelectasis. New subpleural consolidation at the peripheral right base series 6, image 91, suspicious for pulmonary infarct. There are trace bilateral effusions. Upper Abdomen: No acute finding Musculoskeletal: No acute osseous abnormality. Review of the MIP images confirms the above findings. IMPRESSION: 1. Findings consistent with acute bilateral lower lobe segmental and subsegmental pulmonary emboli. New right lateral lower lobe subpleural consolidative focus, suspected to represent pulmonary infarct. Trace pleural effusions. 2. Two new nodular focus of airspace consolidation at the right apex suspect infectious or inflammatory, attention on follow-up CT imaging. Stable 7 mm subpleural right lower lobe pulmonary nodule, reference chest CT 06/20/2023. Critical Value/emergent results were called by telephone at the time of interpretation on 09/29/2023 at 7:31 pm to provider HAYLEY NAASZ , who verbally acknowledged these results. Electronically Signed   By: Jasmine Pang M.D.   On: 09/29/2023 19:31   DG Chest Port 1 View Result Date: 09/29/2023 CLINICAL DATA:  Chest pain. EXAM: PORTABLE CHEST 1 VIEW COMPARISON:  Chest CT dated 12/26/2020. FINDINGS: No focal consolidation, pleural effusion, pneumothorax. The cardiac silhouette is within normal limits. No acute osseous pathology. IMPRESSION: No active disease. Electronically Signed   By: Elgie Collard M.D.   On: 09/29/2023 18:16       Assessment & Plan:     Principal Problem:   Pulmonary embolism (HCC) Active Problems:   Seropositive rheumatoid arthritis (HCC)   Gastroesophageal reflux disease    Acute pulmonary embolism -CTA chest significant for multiple subsegmental PE, -No hypoxia, negative troponins normal cardiac size, evidence of right heart strain which is reassuring -She is started on Eliquis while in ED -Admit for observation overnight given she is tachycardic -Will obtain venous Dopplers. -No clear provoking factors, so ambulatory referral to hematology has been made at time of discharge for further workup, and recommendation for length of treatment(patient sister had history of VTE, as well patient mother died from rare form of blood cancer as per niece, she cannot clarify which 1 exactly)   Seropositive rheumatoid arthritis -She is on methotrexate, she took her treatment dose yesterday  Leukocytosis Thrombocytosis -Is afebrile, denies any urinary symptoms, but will check UA to out any infection, some questionable inflammatory nodules on the lung possibly infectious -Her absolute immature granulocytes are mildly elevated, as well her absolute monocytes  are mildly elevated as well -Will have morning team discussed with hematology to see if this is of any important significance -Will request smear review as well.  Pulmonary  nodules -Possible to new inflammatory pulmonary nodules, right apex, as well clinically stable pulmonary nodule from December 2024, this is to be followed as an outpatient  DVT Prophylaxis H>> started on Eliquis  AM Labs Ordered, also please review Full Orders  Family Communication: Admission, patients condition and plan of care including tests being ordered have been discussed with the patient and niece at bedside* who indicate understanding and agree with the plan and Code Status.  Code Status full code  Likely DC to home  Consults called: None  Admission status: Observation  Time spent in  minutes : 65 minutes   Huey Bienenstock M.D on 09/29/2023 at 8:42 PM   [facility] - Office  [phone]

## 2023-09-29 NOTE — Consult Note (Signed)
 PHARMACY - ANTICOAGULATION CONSULT NOTE  Pharmacy Consult for Apixaban Indication: pulmonary embolus  No Known Allergies  Patient Measurements: Height: 5\' 6"  (167.6 cm) Weight: 58 kg (127 lb 14.4 oz) IBW/kg (Calculated) : 59.3 HEPARIN DW (KG): 58  Vital Signs: Temp: 99 F (37.2 C) (03/26 1648) Temp Source: Oral (03/26 1648) BP: 108/64 (03/26 1900) Pulse Rate: 104 (03/26 1900)  Labs: Recent Labs    09/29/23 1739 09/29/23 1854  HGB 11.4*  --   HCT 34.0*  --   PLT 480*  --   CREATININE 0.82  --   TROPONINIHS 4 4    Estimated Creatinine Clearance: 52.6 mL/min (by C-G formula based on SCr of 0.82 mg/dL).   Medical History: Past Medical History:  Diagnosis Date   Allergy    Arthritis    COPD (chronic obstructive pulmonary disease) (HCC)     Medications:  No home anticoagulants per pharmacist review  Assessment: 78 yo female presented to [facility] for evaluation of chest and back pain.  PMH includes COPD and arthritis.  CT chest revealed bilateral PE.  Pharmacy consulted to initiate apixaban.  Baseline PT/INR ordered.  Goal of Therapy:  Monitor platelets by anticoagulation protocol: Yes   Plan:  Hgb = 11.4, plt 480, eCrCl ~ 52 ml/min Start Apixaban 10 mg po BID x 7 days, followed by 5 mg po BID CBC at least every 72 hours while admitted  Barrie Folk, PharmD 09/29/2023,7:57 PM

## 2023-09-30 ENCOUNTER — Observation Stay (HOSPITAL_COMMUNITY)

## 2023-09-30 ENCOUNTER — Telehealth (HOSPITAL_COMMUNITY): Payer: Self-pay | Admitting: Pharmacy Technician

## 2023-09-30 ENCOUNTER — Other Ambulatory Visit (HOSPITAL_COMMUNITY): Payer: Self-pay

## 2023-09-30 DIAGNOSIS — I2694 Multiple subsegmental pulmonary emboli without acute cor pulmonale: Secondary | ICD-10-CM | POA: Diagnosis not present

## 2023-09-30 DIAGNOSIS — Z0389 Encounter for observation for other suspected diseases and conditions ruled out: Secondary | ICD-10-CM | POA: Diagnosis not present

## 2023-09-30 LAB — CBC
HCT: 31.6 % — ABNORMAL LOW (ref 36.0–46.0)
Hemoglobin: 10.6 g/dL — ABNORMAL LOW (ref 12.0–15.0)
MCH: 32.7 pg (ref 26.0–34.0)
MCHC: 33.5 g/dL (ref 30.0–36.0)
MCV: 97.5 fL (ref 80.0–100.0)
Platelets: 366 10*3/uL (ref 150–400)
RBC: 3.24 MIL/uL — ABNORMAL LOW (ref 3.87–5.11)
RDW: 20.6 % — ABNORMAL HIGH (ref 11.5–15.5)
WBC: 12.5 10*3/uL — ABNORMAL HIGH (ref 4.0–10.5)
nRBC: 0.2 % (ref 0.0–0.2)

## 2023-09-30 LAB — BASIC METABOLIC PANEL WITH GFR
Anion gap: 11 (ref 5–15)
BUN: 11 mg/dL (ref 8–23)
CO2: 24 mmol/L (ref 22–32)
Calcium: 9 mg/dL (ref 8.9–10.3)
Chloride: 104 mmol/L (ref 98–111)
Creatinine, Ser: 0.82 mg/dL (ref 0.44–1.00)
GFR, Estimated: >60 mL/min (ref >60)
Glucose, Bld: 110 mg/dL — ABNORMAL HIGH (ref 70–99)
Potassium: 4.1 mmol/L (ref 3.5–5.1)
Sodium: 139 mmol/L (ref 135–145)

## 2023-09-30 LAB — PROCALCITONIN: Procalcitonin: <0.1 ng/mL

## 2023-09-30 MED ORDER — ONDANSETRON HCL 4 MG/2ML IJ SOLN
4.0000 mg | INTRAMUSCULAR | Status: DC | PRN
  Administered 2023-09-30: 4 mg via INTRAVENOUS
  Filled 2023-09-30: qty 2

## 2023-09-30 MED ORDER — APIXABAN 5 MG PO TABS: ORAL_TABLET | ORAL | 1 refills | Status: DC

## 2023-09-30 NOTE — Discharge Summary (Signed)
 Physician Discharge Summary   Patient: Laurie Wallace MRN: [id number] DOB: 08-13-1945  Admit date:     09/29/2023  Discharge date: 09/30/23  Discharge Physician: Tyrone Nine   PCP: Anabel Halon, MD   Recommendations at discharge:  Follow up with PCP in 1-2 weeks, patient will need repeat CBC and BMP at follow up. Recommend surveillance of pulmonary nodules as outlined below.  Follow up with hematology, Dr. Ellin Saba, in the next month to follow up hypercoagulability labs pending at discharge.   Discharge Diagnoses: Principal Problem:   Pulmonary embolism (HCC) Active Problems:   Seropositive rheumatoid arthritis (HCC)   Gastroesophageal reflux disease  Hospital Course: Laurie Wallace  is a 78 y.o. female, with past medical history of COPD, rheumatoid arthritis, she presents to ED secondary to complaints of chest pain, patient was directed to come to ED after calling PCP for evaluation of chest pain, patient report right upper quadrant abdominal pain, right lower chest pain, as well as right thoracic back pain, under her shoulder blade, reported has been ongoing since past weekend, has any lower extremity edema, hemoptysis, coughing blood, nausea, vomiting, as well reports chest pain worsening by deep breath, no history of trauma, shortness of breath, vomiting or diarrhea, no recent travel, leg trauma or prolonged immobility -In ED CTA chest was obtained, significant for multiple subsegmental PEs, she was noted to be tachycardic, but troponins within normal limit, no hypoxia, she was started on Eliquis and [facility] hospitalist consulted to admit for PE. She remained stable and was discharged on DOAC.   Assessment and Plan: No notes have been filed under this hospital service. Service: Hospitalist    Acute pulmonary embolism -CTA chest significant for multiple subsegmental PE, -No hypoxia, negative troponins normal cardiac size, no evidence of right heart strain which  is reassuring. Not tachycardic.  - Negative LE venous U/S.  - No clear provoking factors, so ambulatory referral to hematology has been made. Dr. Ellin Saba consulted for unprovoked PE, recommended long-term anticoagulation. Sent lupus anticoagulant, anticardiolipin antibodies, anti-beta 2 glycoprotein 1 antibodies (IgG and IgM) on day of discharge which will be followed up in the office after discharge. Ok to DC on DOAC. Pt's sister had history of VTE, as well patient mother died from rare form of blood cancer as per niece, she cannot clarify which 1 exactly)   Seropositive rheumatoid arthritis -She is on methotrexate, she took her treatment dose yesterday   Leukocytosis Thrombocytosis -Is afebrile, denies any urinary symptoms, but will check UA to out any infection, some questionable inflammatory nodules on the lung possibly infectious though PCT negative and pt has no cough, etc. Will monitor off abx.  -Her absolute immature granulocytes are mildly elevated, as well her absolute monocytes are mildly elevated as well. D/w hematology who will recheck at follow up.    Pulmonary  nodules -Possible to new inflammatory pulmonary nodules, right apex, as well clinically stable pulmonary nodule from December 2024, this is to be followed as an outpatient.   Consultants: Hematology curbside (no formal consultation) Procedures performed: None  Disposition: Home Diet recommendation:  Cardiac diet DISCHARGE MEDICATION: Allergies as of 09/30/2023   No Known Allergies      Medication List     TAKE these medications    alendronate 70 MG tablet Commonly known as: FOSAMAX Take 1 tablet (70 mg total) by mouth every 7 (seven) days. Take with a full glass of water on an empty stomach. What changed:  when to take this additional  instructions   apixaban 5 MG Tabs tablet Commonly known as: Eliquis Take 2 tablets (10mg ) twice daily for 7 days, then 1 tablet (5mg ) twice daily   famotidine 40 MG  tablet Commonly known as: PEPCID TAKE ONE TABLET BY MOUTH EVERY DAY   folic acid 1 MG tablet Commonly known as: FOLVITE Take 1 tablet (1 mg total) by mouth daily.   Iron (Ferrous Sulfate) 325 (65 Fe) MG Tabs Take 325 mg by mouth daily.   levocetirizine 5 MG tablet Commonly known as: XYZAL Take 1 tablet (5 mg total) by mouth every evening.   methotrexate 2.5 MG tablet Commonly known as: RHEUMATREX Take 6 tablets (15 mg total) by mouth once a week. Caution:Chemotherapy. Protect from light.        Follow-up Information     Anabel Halon, MD Follow up.   Specialty: Internal Medicine Contact information: [address] [place] [address] [phone]         Doreatha Massed, MD. Schedule an appointment as soon as possible for a visit in 1 month(s).   Specialty: Hematology Why: Call the clinic to schedule if you haven't heard from them by Monday. Contact information: [address] [place] [address] [phone]                Discharge Exam: Ceasar Mons Weights   09/29/23 1646 09/29/23 2106  Weight: 58 kg 53.3 kg   Well-appearing female with family at bedside, no complaints, eating [employer]. R side pain on deep inspiration is improved. No bleeding. Clear, nonlabored RRR, no MRG or edema  Condition at discharge: stable  The results of significant diagnostics from this hospitalization (including imaging, microbiology, ancillary and laboratory) are listed below for reference.   Imaging Studies: US Venous Img Lower Bilateral (DVT) Result Date: 09/30/2023 CLINICAL DATA:  [id number] Pulmonary emboli (HCC) [id number] EXAM: BILATERAL LOWER EXTREMITY VENOUS DOPPLER ULTRASOUND TECHNIQUE: Gray-scale sonography with graded compression, as well as color Doppler and duplex ultrasound were performed to evaluate the lower extremity deep venous systems from the level of the common femoral vein and including the common femoral, femoral, profunda femoral, popliteal and  calf veins including the posterior tibial, peroneal and gastrocnemius veins when visible. The superficial great saphenous vein was also interrogated. Spectral Doppler was utilized to evaluate flow at rest and with distal augmentation maneuvers in the common femoral, femoral and popliteal veins. COMPARISON:  Chest XR, 09/29/2023. FINDINGS: RIGHT LOWER EXTREMITY VENOUS Normal compressibility of the RIGHT common femoral, superficial femoral, and popliteal veins, as well as the visualized calf veins. Visualized portions of profunda femoral vein and great saphenous vein unremarkable. No filling defects to suggest DVT on grayscale or color Doppler imaging. Doppler waveforms show normal direction of venous flow, normal respiratory plasticity and response to augmentation. OTHER No evidence of superficial thrombophlebitis or abnormal fluid collection. Limitations: none LEFT LOWER EXTREMITY VENOUS Normal compressibility of the LEFT common femoral, superficial femoral, and popliteal veins, as well as the visualized calf veins. Visualized portions of profunda femoral vein and great saphenous vein unremarkable. No filling defects to suggest DVT on grayscale or color Doppler imaging. Doppler waveforms show normal direction of venous flow, normal respiratory plasticity and response to augmentation. OTHER No evidence of superficial thrombophlebitis or abnormal fluid collection. Limitations: none IMPRESSION: No evidence of femoropopliteal DVT or superficial thrombophlebitis within either lower extremity. Roanna Banning, MD [facility] Radiology Electronically Signed   By: Roanna Banning M.D.   On: 09/30/2023 10:30  CT Angio Chest PE W and/or Wo Contrast Result Date: 09/29/2023 CLINICAL DATA:  Bilateral chest discomfort EXAM: CT ANGIOGRAPHY CHEST WITH CONTRAST TECHNIQUE: Multidetector CT imaging of the chest was performed using the standard protocol during bolus administration of  intravenous contrast. Multiplanar CT image reconstructions and MIPs were obtained to evaluate the vascular anatomy. RADIATION DOSE REDUCTION: This exam was performed according to the departmental dose-optimization program which includes automated exposure control, adjustment of the mA and/or kV according to patient size and/or use of iterative reconstruction technique. CONTRAST:  75mL OMNIPAQUE IOHEXOL 350 MG/ML SOLN COMPARISON:  Chest x-ray 09/29/2023, CT chest 06/20/2023 FINDINGS: Cardiovascular: Satisfactory opacification of the pulmonary arteries to the segmental level. Positive for acute bilateral lower lobe right greater than left segmental and subsegmental PE. Nonaneurysmal aorta. No dissection. Irregular mural thrombus in the descending thoracic aorta. Normal cardiac size. No pericardial effusion Mediastinum/Nodes: Patent trachea. No thyroid mass. No suspicious lymph nodes. Esophagus within normal limits. Lungs/Pleura: No pleural effusion or pneumothorax. Apical pleuroparenchymal scarring. New focus of subpleural right apical anterior airspace disease, series 6, image 24. Subpleural right lower lobe nodular focus on series 6 image 83 measures 7 mm compared with 7 mm previously, partially obscured by surrounding dependent atelectasis. New subpleural consolidation at the peripheral right base series 6, image 91, suspicious for pulmonary infarct. There are trace bilateral effusions. Upper Abdomen: No acute finding Musculoskeletal: No acute osseous abnormality. Review of the MIP images confirms the above findings. IMPRESSION: 1. Findings consistent with acute bilateral lower lobe segmental and subsegmental pulmonary emboli. New right lateral lower lobe subpleural consolidative focus, suspected to represent pulmonary infarct. Trace pleural effusions. 2. Two new nodular focus of airspace consolidation at the right apex suspect infectious or inflammatory, attention on follow-up CT imaging. Stable 7 mm subpleural  right lower lobe pulmonary nodule, reference chest CT 06/20/2023. Critical Value/emergent results were called by telephone at the time of interpretation on 09/29/2023 at 7:31 pm to provider HAYLEY NAASZ , who verbally acknowledged these results. Electronically Signed   By: Jasmine Pang M.D.   On: 09/29/2023 19:31   DG Chest Port 1 View Result Date: 09/29/2023 CLINICAL DATA:  Chest pain. EXAM: PORTABLE CHEST 1 VIEW COMPARISON:  Chest CT dated 12/26/2020. FINDINGS: No focal consolidation, pleural effusion, pneumothorax. The cardiac silhouette is within normal limits. No acute osseous pathology. IMPRESSION: No active disease. Electronically Signed   By: Elgie Collard M.D.   On: 09/29/2023 18:16    Microbiology: Results for orders placed or performed during the hospital encounter of 01/09/23  Blood culture (routine x 2)     Status: None   Collection Time: 01/10/23  1:49 AM   Specimen: Right Antecubital; Blood  Result Value Ref Range Status   Specimen Description RIGHT ANTECUBITAL  Final   Special Requests   Final    BOTTLES DRAWN AEROBIC AND ANAEROBIC Blood Culture adequate volume   Culture   Final    NO GROWTH 5 DAYS Performed at [facility], [address] [place], [place] [address]    Report Status 01/15/2023 FINAL  Final  Blood culture (routine x 2)     Status: None   Collection Time: 01/10/23  3:17 AM   Specimen: BLOOD LEFT HAND  Result Value Ref Range Status   Specimen Description BLOOD LEFT HAND  Final   Special Requests   Final    BOTTLES DRAWN AEROBIC AND ANAEROBIC Blood Culture adequate volume   Culture   Final    NO GROWTH 5 DAYS  Performed at [facility], [address], [place], [place] [address]    Report Status 01/15/2023 FINAL  Final    Labs: CBC: Recent Labs  Lab 09/29/23 1739 09/30/23 0429  WBC 13.7* 12.5*  NEUTROABS 7.4  --   HGB 11.4* 10.6*  HCT 34.0* 31.6*  MCV 98.3 97.5  PLT 480* 366   Basic Metabolic Panel: Recent Labs  Lab 09/29/23 1739  09/30/23 0429  NA 139 139  K 4.5 4.1  CL 102 104  CO2 25 24  GLUCOSE 109* 110*  BUN 11 11  CREATININE 0.82 0.82  CALCIUM 9.4 9.0   Liver Function Tests: Recent Labs  Lab 09/29/23 1739  AST 24  ALT 21  ALKPHOS 112  BILITOT 0.3  PROT 7.6  ALBUMIN 3.7   CBG: No results for input(s): "GLUCAP" in the last 168 hours.  Discharge time spent: greater than 30 minutes.  Signed: Tyrone Nine, MD [facility] 09/30/2023

## 2023-09-30 NOTE — Plan of Care (Signed)
  Problem: Clinical Measurements: Goal: Ability to maintain clinical measurements within normal limits will improve Outcome: Progressing Goal: Will remain free from infection Outcome: Progressing Goal: Diagnostic test results will improve Outcome: Progressing   Problem: Nutrition: Goal: Adequate nutrition will be maintained Outcome: Progressing   Problem: Coping: Goal: Level of anxiety will decrease Outcome: Progressing   Problem: Safety: Goal: Ability to remain free from injury will improve Outcome: Progressing   Problem: Skin Integrity: Goal: Risk for impaired skin integrity will decrease Outcome: Progressing

## 2023-09-30 NOTE — Telephone Encounter (Signed)
 Patient Product/process development scientist completed.    The patient is insured through [facility]. Patient has Medicare and is not eligible for a copay card, but may be able to apply for patient assistance or Medicare RX Payment Plan (Patient Must reach out to their plan, if eligible for payment plan), if available.    Ran test claim for Eliquis 5 mg and the current 30 day co-pay is $290.62 due to a deductible.   This test claim was processed through [facility]- copay amounts may vary at other pharmacies due to pharmacy/plan contracts, or as the patient moves through the different stages of their insurance plan.     Roland Earl, CPHT Pharmacy Technician III Certified Patient Advocate [facility] Pharmacy Patient Advocate Team Direct Number: [phone]  Fax: [phone]

## 2023-09-30 NOTE — TOC CM/SW Note (Signed)
 Spoke with pt and her granddaughter to review cost of Eliquis at first fill will be almost $300 due to 2025 medication deductible needing to be met. They expressed understanding and stated that the pharmacist had given them a 30 day free coupon card so they would use that first and then be aware of the possible co-pay when it is next filled.  No other TOC needs identified.

## 2023-09-30 NOTE — Discharge Instructions (Signed)
Information on my medicine - ELIQUIS (apixaban)  This medication education was reviewed with me or my healthcare representative as part of my discharge preparation.   Why was Eliquis prescribed for you? Eliquis was prescribed to treat blood clots that may have been found in the veins of your legs (deep vein thrombosis) or in your lungs (pulmonary embolism) and to reduce the risk of them occurring again.  What do You need to know about Eliquis ? The starting dose is 10 mg (two 5 mg tablets) taken TWICE daily for the FIRST SEVEN (7) DAYS, then the dose is reduced to ONE 5 mg tablet taken TWICE daily.  Eliquis may be taken with or without food.   Try to take the dose about the same time in the morning and in the evening. If you have difficulty swallowing the tablet whole please discuss with your pharmacist how to take the medication safely.  Take Eliquis exactly as prescribed and DO NOT stop taking Eliquis without talking to the doctor who prescribed the medication.  Stopping may increase your risk of developing a new blood clot.  Refill your prescription before you run out.  After discharge, you should have regular check-up appointments with your healthcare provider that is prescribing your Eliquis.    What do you do if you miss a dose? If a dose of ELIQUIS is not taken at the scheduled time, take it as soon as possible on the same day and twice-daily administration should be resumed. The dose should not be doubled to make up for a missed dose.  Important Safety Information A possible side effect of Eliquis is bleeding. You should call your healthcare provider right away if you experience any of the following: Bleeding from an injury or your nose that does not stop. Unusual colored urine (red or dark brown) or unusual colored stools (red or black). Unusual bruising for unknown reasons. A serious fall or if you hit your head (even if there is no bleeding).  Some medicines may  interact with Eliquis and might increase your risk of bleeding or clotting while on Eliquis. To help avoid this, consult your healthcare provider or pharmacist prior to using any new prescription or non-prescription medications, including herbals, vitamins, non-steroidal anti-inflammatory drugs (NSAIDs) and supplements.  This website has more information on Eliquis (apixaban): http://www.eliquis.com/eliquis/home  

## 2023-10-01 LAB — BETA-2-GLYCOPROTEIN I ABS, IGG/M/A
Beta-2 Glyco I IgG: <9 GPI IgG units (ref 0–20)
Beta-2-Glycoprotein I IgA: <9 GPI IgA units (ref 0–25)
Beta-2-Glycoprotein I IgM: <9 GPI IgM units (ref 0–32)

## 2023-10-01 LAB — CARDIOLIPIN ANTIBODIES, IGM+IGG
Anticardiolipin IgG: <9 GPL U/mL (ref 0–14)
Anticardiolipin IgM: 10 [MPL'U]/mL (ref 0–12)

## 2023-10-02 LAB — LUPUS ANTICOAGULANT
DRVVT: 113.9 s — ABNORMAL HIGH (ref 0.0–47.0)
PTT Lupus Anticoagulant: 51.2 s — ABNORMAL HIGH (ref 0.0–43.5)
Thrombin Time: 15.3 s (ref 0.0–23.0)
dPT Confirm Ratio: 0.82 ratio (ref 0.00–1.34)
dPT: 52.1 s — ABNORMAL HIGH (ref 0.0–47.6)

## 2023-10-02 LAB — DRVVT CONFIRM: dRVVT Confirm: 1.1 ratio (ref 0.8–1.2)

## 2023-10-02 LAB — HEXAGONAL PHASE PHOSPHOLIPID: Hexagonal Phase Phospholipid: 5 s (ref 0–11)

## 2023-10-02 LAB — DRVVT MIX: dRVVT Mix: 67.5 s — ABNORMAL HIGH (ref 0.0–40.4)

## 2023-10-02 LAB — PTT-LA MIX: PTT-LA Mix: 42.9 s — ABNORMAL HIGH (ref 0.0–40.5)

## 2023-10-04 ENCOUNTER — Telehealth: Payer: Self-pay

## 2023-10-04 NOTE — Transitions of Care (Post Inpatient/ED Visit) (Signed)
   10/04/2023  Name: Laurie Wallace MRN: [id number] DOB: 03-26-1946  Today's TOC FU Call Status: Today's TOC FU Call Status:: Successful TOC FU Call Completed TOC FU Call Complete Date: 10/04/23 Patient's Name and Date of Birth confirmed.  Transition Care Management Follow-up Telephone Call Date of Discharge: 09/30/23 [facility] Type of Discharge: Inpatient Admission Primary Inpatient Discharge Diagnosis:: Pulmonary Embolism How have you been since you were released from the hospital?: Better Any questions or concerns?: No  Items Reviewed: Did you receive and understand the discharge instructions provided?: Yes Medications obtained,verified, and reconciled?: Yes (Medications Reviewed) Any new allergies since your discharge?: No Dietary orders reviewed?: NA Do you have support at home?: Yes People in Home: child(ren), adult  Medications Reviewed Today: Medications Reviewed Today     Reviewed by Anthoney Harada, LPN (Licensed Practical Nurse) on 10/04/23 at 1046  Med List Status: <None>   Medication Order Taking? Sig Documenting Provider Last Dose Status Informant  alendronate (FOSAMAX) 70 MG tablet [id number] Yes Take 1 tablet (70 mg total) by mouth every 7 (seven) days. Take with a full glass of water on an empty stomach.  Patient taking differently: Take 70 mg by mouth every Monday.   Anabel Halon, MD Taking Active Self  apixaban (ELIQUIS) 5 MG TABS tablet [id number] Yes Take 2 tablets (10mg ) twice daily for 7 days, then 1 tablet (5mg ) twice daily Tyrone Nine, MD Taking Active     Discontinued 07/23/20 1124   famotidine (PEPCID) 40 MG tablet [id number] Yes TAKE ONE TABLET BY MOUTH EVERY DAY Anabel Halon, MD Taking Active Self    Discontinued 07/23/20 1124   folic acid (FOLVITE) 1 MG tablet [id number] Yes Take 1 tablet (1 mg total) by mouth daily. Fuller Plan, MD Taking Active Self  Iron, Ferrous Sulfate, 325 (65 Fe) MG TABS  [id number] Yes Take 325 mg by mouth daily. Anabel Halon, MD Taking Active Self  levocetirizine (XYZAL) 5 MG tablet [id number] Yes Take 1 tablet (5 mg total) by mouth every evening. Del Newman Nip, Tenna Child, FNP Taking Active Self  methotrexate (RHEUMATREX) 2.5 MG tablet [id number] Yes Take 6 tablets (15 mg total) by mouth once a week. Caution:Chemotherapy. Protect from light. Fuller Plan, MD Taking Active Self            Home Care and Equipment/Supplies: Were Home Health Services Ordered?: NA Any new equipment or medical supplies ordered?: NA  Functional Questionnaire: Do you need assistance with bathing/showering or dressing?: No Do you need assistance with meal preparation?: No Do you need assistance with eating?: No Do you have difficulty maintaining continence: No Do you need assistance with getting out of bed/getting out of a chair/moving?: No Do you have difficulty managing or taking your medications?: No  Follow up appointments reviewed: PCP Follow-up appointment confirmed?: Yes Date of PCP follow-up appointment?: 10/13/23 Follow-up Provider: Dr. Trena Platt Specialist [facility] Follow-up appointment confirmed?: Yes Date of Specialist follow-up appointment?: 10/05/23 Follow-Up Specialty Provider:: Dr. Ellin Saba Do you need transportation to your follow-up appointment?: No Do you understand care options if your condition(s) worsen?: Yes-patient verbalized understanding    SIGNATURE Kandis Fantasia, LPN [facility] Health Advisor [place] [facility] You Are. We Are. [facility] Direct Dial [phone]

## 2023-10-04 NOTE — Progress Notes (Signed)
 [facility] [address] [place] [address]   Clinic Day:  10/05/2023  Referring physician: Anabel Halon, MD  Patient Care Team: Anabel Halon, MD as PCP - General (Internal Medicine) Bing Neighbors, NP as Nurse Practitioner (Family Medicine)   ASSESSMENT & PLAN:   Assessment:  1.  Unprovoked pulmonary embolism: - Presentation: Pleuritic chest pain involving right shoulder blade area and right anterior lower ribs.  No prior immobilization. - CT angiogram chest (09/29/2023): Acute bilateral lower lobe segmental and subsegmental pulmonary emboli.  New right lateral lower lobe subpleural consolidative focus, suggestive of pulmonary infarct. - Lower extremity Doppler (09/30/2023): No evidence of femoral-popliteal DVT or superficial thrombophlebitis. - Labs: Negative lupus anticoagulant, cardiolipin and beta-2 glycoprotein 1 antibodies. - Denies B symptoms.  No prior history of DVT/PE.  Never had colonoscopy. - Tolerating Eliquis very well.  2.  Social/family history: - Lives by herself at home.  Independent of ADLs and IADLs.  Non-smoker. - Sister had breast cancer.  She also had DVT and likely had miscarriages.  3.  Rheumatoid arthritis: - Involved sites: Right shoulder and right arm.  On methotrexate since later part of 2022   Plan:  1.  Unprovoked pulmonary embolism: - We discussed findings on imaging in detail. - As she had unprovoked pulmonary embolism, I have recommended indefinite anticoagulation. - No clear signs of active malignancy.  She never had colonoscopy and would prefer not to have it.  I have recommended screening mammogram as her last mammogram was in 2022. - I will see her back in 6 months for follow-up.  I will check a D-dimer at that time and evaluate benefits and risks of continuing anticoagulation indefinitely.  2.  Normocytic anemia: - She has mild normocytic anemia since June 2024.  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   Will check ferritin, iron panel, B12 and folic acid at next visit along with CBC.   Orders Placed This Encounter  Procedures   MM 3D SCREENING MAMMOGRAM BILATERAL BREAST    Standing Status:   Future    Expected Date:   10/19/2023    Expiration Date:   10/04/2024    Reason for Exam (SYMPTOM  OR DIAGNOSIS REQUIRED):   breast cancer screening    Preferred imaging location?:   [facility]   CBC with Differential    Standing Status:   Future    Expected Date:   04/03/2024    Expiration Date:   10/04/2024   Comprehensive metabolic panel    Standing Status:   Future    Expected Date:   04/03/2024    Expiration Date:   10/04/2024   Iron and TIBC ([facility] ONLY)    Standing Status:   Future    Expected Date:   04/03/2024    Expiration Date:   10/04/2024   Ferritin    Standing Status:   Future    Expected Date:   04/03/2024    Expiration Date:   10/04/2024   Vitamin B12    Standing Status:   Future    Expected Date:   04/03/2024    Expiration Date:   10/04/2024   Folate    Standing Status:   Future    Expected Date:   04/03/2024    Expiration Date:   10/04/2024   D-dimer, quantitative    Standing Status:   Future    Expected Date:   04/03/2024    Expiration Date:   10/04/2024  Alben Deeds Teague,acting as a Neurosurgeon for Doreatha Massed, MD.,have documented all relevant documentation on the behalf of Doreatha Massed, MD,as directed by  Doreatha Massed, MD while in the presence of Doreatha Massed, MD.   I, Doreatha Massed MD, have reviewed the above documentation for accuracy and completeness, and I agree with the above.   Doreatha Massed, MD   4/1/20252:08 PM  CHIEF COMPLAINT/PURPOSE OF CONSULT:   Diagnosis: Unprovoked pulmonary embolism  Current Therapy: Eliquis  HISTORY OF PRESENT ILLNESS:   Jordane is a 78 y.o. female presenting to clinic today for evaluation of unprovoked PE and thrombocytosis with inpatient referral.  Patient was  hospitalized from 09/29/23 to 09/30/23 for an unprovoked acute PE. CTA showed: Findings consistent with acute bilateral lower lobe segmental and subsegmental pulmonary emboli. New right lateral lower lobe subpleural consolidative focus, suspected to represent pulmonary infarct. Trace pleural effusions.   Hyper coagulative lab work was done with abnormal findings in lupus anticoagulant, PTT-LA mis was elevated at 42.9, and dRVVT Mix was elevated at 67.5.  Her most recent CBC from 09/30/23 found elevated WBC at 12.5, low RBC at 3.24, low HGB at 10.6, low HCT at 31.6, and elevated RDW at 20.6.  Patient has a history of seropositive rheumatoid arthritis and GERD.  Today, she states that she is doing well overall. Her appetite level is at 75%. Her energy level is at 70%.    PAST MEDICAL HISTORY:   Past Medical History: Past Medical History:  Diagnosis Date   Allergy    Arthritis    COPD (chronic obstructive pulmonary disease) (HCC)     Surgical History: Past Surgical History:  Procedure Laterality Date   ABDOMINAL HYSTERECTOMY     TOE SURGERY Right    1st toe    Social History: Social History   Socioeconomic History   Marital status: Single    Spouse name: Not on file   Number of children: Not on file   Years of education: Not on file   Highest education level: Not on file  Occupational History   Occupation: full time  Tobacco Use   Smoking status: Never    Passive exposure: Never   Smokeless tobacco: Never  Vaping Use   Vaping status: Never Used  Substance and Sexual Activity   Alcohol use: No   Drug use: No   Sexual activity: Not Currently  Other Topics Concern   Not on file  Social History Narrative   Lives with her son   Right Handed   Drinks 1-2 cups caffeine daily in winter   Social Drivers of Health   Financial Resource Strain: Low Risk  (06/14/2023)   Overall Financial Resource Strain (CARDIA)    Difficulty of Paying Living Expenses: Not hard at all  Food  Insecurity: No Food Insecurity (09/29/2023)   Hunger Vital Sign    Worried About Running Out of Food in the Last Year: Never true    Ran Out of Food in the Last Year: Never true  Transportation Needs: No Transportation Needs (09/29/2023)   PRAPARE - Administrator, Civil Service (Medical): No    Lack of Transportation (Non-Medical): No  Physical Activity: Sufficiently Active (06/14/2023)   Exercise Vital Sign    Days of Exercise per Week: 5 days    Minutes of Exercise per Session: 30 min  Stress: No Stress Concern Present (06/14/2023)   [employer] of Occupational Health - Occupational Stress Questionnaire    Feeling of Stress : Not at  all  Social Connections: Moderately Isolated (09/29/2023)   Social Connection and Isolation Panel [NHANES]    Frequency of Communication with Friends and Family: More than three times a week    Frequency of Social Gatherings with Friends and Family: More than three times a week    Attends Religious Services: More than 4 times per year    Active Member of [employer] or Organizations: No    Attends Banker Meetings: Never    Marital Status: Widowed  Intimate Partner Violence: Not At Risk (09/29/2023)   Humiliation, Afraid, Rape, and Kick questionnaire    Fear of Current or Ex-Partner: No    Emotionally Abused: No    Physically Abused: No    Sexually Abused: No    Family History: Family History  Problem Relation Age of Onset   Cancer Mother    Cancer Father    Heart failure Sister    COPD Sister    Dementia Sister     Current Medications:  Current Outpatient Medications:    alendronate (FOSAMAX) 70 MG tablet, Take 1 tablet (70 mg total) by mouth every 7 (seven) days. Take with a full glass of water on an empty stomach. (Patient taking differently: Take 70 mg by mouth every Monday.), Disp: 4 tablet, Rfl: 11   apixaban (ELIQUIS) 5 MG TABS tablet, Take 2 tablets (10mg ) twice daily for 7 days, then 1 tablet (5mg ) twice daily,  Disp: 60 tablet, Rfl: 1   famotidine (PEPCID) 40 MG tablet, TAKE ONE TABLET BY MOUTH EVERY DAY, Disp: 30 tablet, Rfl: 1   folic acid (FOLVITE) 1 MG tablet, Take 1 tablet (1 mg total) by mouth daily., Disp: 90 tablet, Rfl: 3   Iron, Ferrous Sulfate, 325 (65 Fe) MG TABS, Take 325 mg by mouth daily., Disp: 30 tablet, Rfl: 5   levocetirizine (XYZAL) 5 MG tablet, Take 1 tablet (5 mg total) by mouth every evening., Disp: 30 tablet, Rfl: 5   methotrexate (RHEUMATREX) 2.5 MG tablet, Take 6 tablets (15 mg total) by mouth once a week. Caution:Chemotherapy. Protect from light., Disp: 72 tablet, Rfl: 0   Allergies: No Known Allergies  REVIEW OF SYSTEMS:   Review of Systems  Constitutional:  Negative for chills, fatigue and fever.  HENT:   Negative for lump/mass, mouth sores, nosebleeds, sore throat and trouble swallowing.   Eyes:  Negative for eye problems.  Respiratory:  Positive for cough. Negative for shortness of breath.   Cardiovascular:  Negative for chest pain, leg swelling and palpitations.  Gastrointestinal:  Positive for constipation and nausea. Negative for abdominal pain, diarrhea and vomiting.  Genitourinary:  Negative for bladder incontinence, difficulty urinating, dysuria, frequency, hematuria and nocturia.   Musculoskeletal:  Negative for arthralgias, back pain, flank pain, myalgias and neck pain.  Skin:  Negative for itching and rash.  Neurological:  Positive for dizziness. Negative for headaches and numbness.  Hematological:  Does not bruise/bleed easily.  Psychiatric/Behavioral:  Negative for depression, sleep disturbance and suicidal ideas. The patient is not nervous/anxious.   All other systems reviewed and are negative.    VITALS:   Blood pressure (!) 135/57, pulse 90, temperature 98 F (36.7 C), temperature source Oral, resp. rate 20, height 5' 3.98" (1.625 m), weight 118 lb 9.7 oz (53.8 kg), SpO2 100%.  Wt Readings from Last 3 Encounters:  10/05/23 118 lb 9.7 oz (53.8 kg)   09/29/23 117 lb 8.1 oz (53.3 kg)  09/22/23 117 lb 12.8 oz (53.4 kg)    Body  mass index is 20.37 kg/m.   PHYSICAL EXAM:   Physical Exam Vitals and nursing note reviewed. Exam conducted with a chaperone present.  Constitutional:      Appearance: Normal appearance.  Cardiovascular:     Rate and Rhythm: Normal rate and regular rhythm.     Pulses: Normal pulses.     Heart sounds: Normal heart sounds.  Pulmonary:     Effort: Pulmonary effort is normal.     Breath sounds: Normal breath sounds.  Abdominal:     Palpations: Abdomen is soft. There is no hepatomegaly, splenomegaly or mass.     Tenderness: There is no abdominal tenderness.  Musculoskeletal:     Right lower leg: No edema.     Left lower leg: No edema.  Lymphadenopathy:     Cervical: No cervical adenopathy.     Right cervical: No superficial, deep or posterior cervical adenopathy.    Left cervical: No superficial, deep or posterior cervical adenopathy.     Upper Body:     Right upper body: No supraclavicular or axillary adenopathy.     Left upper body: No supraclavicular or axillary adenopathy.  Neurological:     General: No focal deficit present.     Mental Status: She is alert and oriented to person, place, and time.  Psychiatric:        Mood and Affect: Mood normal.        Behavior: Behavior normal.     LABS:   CBC    Component Value Date/Time   WBC 12.5 (H) 09/30/2023 0429   RBC 3.24 (L) 09/30/2023 0429   HGB 10.6 (L) 09/30/2023 0429   HGB 10.8 (L) 01/29/2023 0855   HCT 31.6 (L) 09/30/2023 0429   HCT 33.0 (L) 01/29/2023 0855   PLT 366 09/30/2023 0429   PLT 311 01/29/2023 0855   MCV 97.5 09/30/2023 0429   MCV 90 01/29/2023 0855   MCH 32.7 09/30/2023 0429   MCHC 33.5 09/30/2023 0429   RDW 20.6 (H) 09/30/2023 0429   RDW 16.6 (H) 01/29/2023 0855   LYMPHSABS 2.2 09/29/2023 1739   LYMPHSABS 2.0 01/29/2023 0855   MONOABS 2.2 (H) 09/29/2023 1739   EOSABS 0.4 09/29/2023 1739   EOSABS 0.1 01/29/2023 0855    BASOSABS 0.0 09/29/2023 1739   BASOSABS 0.1 01/29/2023 0855    CMP    Component Value Date/Time   NA 139 09/30/2023 0429   NA 142 01/29/2023 0855   K 4.1 09/30/2023 0429   CL 104 09/30/2023 0429   CO2 24 09/30/2023 0429   GLUCOSE 110 (H) 09/30/2023 0429   BUN 11 09/30/2023 0429   BUN 8 01/29/2023 0855   CREATININE 0.82 09/30/2023 0429   CREATININE 0.85 09/22/2023 1159   CALCIUM 9.0 09/30/2023 0429   PROT 7.6 09/29/2023 1739   PROT 7.0 01/29/2023 0855   ALBUMIN 3.7 09/29/2023 1739   ALBUMIN 3.8 01/29/2023 0855   AST 24 09/29/2023 1739   ALT 21 09/29/2023 1739   ALKPHOS 112 09/29/2023 1739   BILITOT 0.3 09/29/2023 1739   BILITOT 0.3 01/29/2023 0855   GFRNONAA >60 09/30/2023 0429   GFRAA 88 09/28/2014 0947    No results found for: "CEA1", "CEA" / No results found for: "CEA1", "CEA" No results found for: "PSA1" No results found for: "YQM578" No results found for: "ION629"  Lab Results  Component Value Date   ALBUMINELP 3.2 (L) 06/24/2023   A1GS 0.4 (H) 06/24/2023   A2GS 0.9 06/24/2023   BETS 0.4 06/24/2023  BETA2SER 0.5 06/24/2023   GAMS 1.2 06/24/2023   SPEI  06/24/2023     Comment:     . A poorly-defined band of restricted protein mobility is detected in the gamma globulins. It is unlikely that this may represent a monoclonal protein; however, immunofixation analysis is available if clinically indicated. .    No results found for: "TIBC", "FERRITIN", "IRONPCTSAT" No results found for: "LDH"   STUDIES:   US Venous Img Lower Bilateral (DVT) Result Date: 09/30/2023 CLINICAL DATA:  [id number] Pulmonary emboli (HCC) [id number] EXAM: BILATERAL LOWER EXTREMITY VENOUS DOPPLER ULTRASOUND TECHNIQUE: Gray-scale sonography with graded compression, as well as color Doppler and duplex ultrasound were performed to evaluate the lower extremity deep venous systems from the level of the common femoral vein and including the common femoral, femoral, profunda femoral, popliteal and  calf veins including the posterior tibial, peroneal and gastrocnemius veins when visible. The superficial great saphenous vein was also interrogated. Spectral Doppler was utilized to evaluate flow at rest and with distal augmentation maneuvers in the common femoral, femoral and popliteal veins. COMPARISON:  Chest XR, 09/29/2023. FINDINGS: RIGHT LOWER EXTREMITY VENOUS Normal compressibility of the RIGHT common femoral, superficial femoral, and popliteal veins, as well as the visualized calf veins. Visualized portions of profunda femoral vein and great saphenous vein unremarkable. No filling defects to suggest DVT on grayscale or color Doppler imaging. Doppler waveforms show normal direction of venous flow, normal respiratory plasticity and response to augmentation. OTHER No evidence of superficial thrombophlebitis or abnormal fluid collection. Limitations: none LEFT LOWER EXTREMITY VENOUS Normal compressibility of the LEFT common femoral, superficial femoral, and popliteal veins, as well as the visualized calf veins. Visualized portions of profunda femoral vein and great saphenous vein unremarkable. No filling defects to suggest DVT on grayscale or color Doppler imaging. Doppler waveforms show normal direction of venous flow, normal respiratory plasticity and response to augmentation. OTHER No evidence of superficial thrombophlebitis or abnormal fluid collection. Limitations: none IMPRESSION: No evidence of femoropopliteal DVT or superficial thrombophlebitis within either lower extremity. Roanna Banning, MD [facility] Electronically Signed   By: Roanna Banning M.D.   On: 09/30/2023 10:30   CT Angio Chest PE W and/or Wo Contrast Result Date: 09/29/2023 CLINICAL DATA:  Bilateral chest discomfort EXAM: CT ANGIOGRAPHY CHEST WITH CONTRAST TECHNIQUE: Multidetector CT imaging of the chest was performed using the standard protocol during bolus administration of  intravenous contrast. Multiplanar CT image reconstructions and MIPs were obtained to evaluate the vascular anatomy. RADIATION DOSE REDUCTION: This exam was performed according to the departmental dose-optimization program which includes automated exposure control, adjustment of the mA and/or kV according to patient size and/or use of iterative reconstruction technique. CONTRAST:  75mL OMNIPAQUE IOHEXOL 350 MG/ML SOLN COMPARISON:  Chest x-ray 09/29/2023, CT chest 06/20/2023 FINDINGS: Cardiovascular: Satisfactory opacification of the pulmonary arteries to the segmental level. Positive for acute bilateral lower lobe right greater than left segmental and subsegmental PE. Nonaneurysmal aorta. No dissection. Irregular mural thrombus in the descending thoracic aorta. Normal cardiac size. No pericardial effusion Mediastinum/Nodes: Patent trachea. No thyroid mass. No suspicious lymph nodes. Esophagus within normal limits. Lungs/Pleura: No pleural effusion or pneumothorax. Apical pleuroparenchymal scarring. New focus of subpleural right apical anterior airspace disease, series 6, image 24. Subpleural right lower lobe nodular focus on series 6 image 83 measures 7 mm compared with 7 mm previously, partially obscured by surrounding dependent atelectasis. New subpleural consolidation at the peripheral right base series 6, image 91, suspicious for  pulmonary infarct. There are trace bilateral effusions. Upper Abdomen: No acute finding Musculoskeletal: No acute osseous abnormality. Review of the MIP images confirms the above findings. IMPRESSION: 1. Findings consistent with acute bilateral lower lobe segmental and subsegmental pulmonary emboli. New right lateral lower lobe subpleural consolidative focus, suspected to represent pulmonary infarct. Trace pleural effusions. 2. Two new nodular focus of airspace consolidation at the right apex suspect infectious or inflammatory, attention on follow-up CT imaging. Stable 7 mm subpleural  right lower lobe pulmonary nodule, reference chest CT 06/20/2023. Critical Value/emergent results were called by telephone at the time of interpretation on 09/29/2023 at 7:31 pm to provider HAYLEY NAASZ , who verbally acknowledged these results. Electronically Signed   By: Jasmine Pang M.D.   On: 09/29/2023 19:31   DG Chest Port 1 View Result Date: 09/29/2023 CLINICAL DATA:  Chest pain. EXAM: PORTABLE CHEST 1 VIEW COMPARISON:  Chest CT dated 12/26/2020. FINDINGS: No focal consolidation, pleural effusion, pneumothorax. The cardiac silhouette is within normal limits. No acute osseous pathology. IMPRESSION: No active disease. Electronically Signed   By: Elgie Collard M.D.   On: 09/29/2023 18:16

## 2023-10-05 ENCOUNTER — Inpatient Hospital Stay

## 2023-10-05 ENCOUNTER — Inpatient Hospital Stay: Attending: Hematology | Admitting: Hematology

## 2023-10-05 VITALS — BP 135/57 | HR 90 | Temp 98.0°F | Resp 20 | Ht 63.98 in | Wt 118.6 lb

## 2023-10-05 DIAGNOSIS — D75839 Thrombocytosis, unspecified: Secondary | ICD-10-CM | POA: Diagnosis not present

## 2023-10-05 DIAGNOSIS — M25511 Pain in right shoulder: Secondary | ICD-10-CM

## 2023-10-05 DIAGNOSIS — Z90710 Acquired absence of both cervix and uterus: Secondary | ICD-10-CM | POA: Diagnosis not present

## 2023-10-05 DIAGNOSIS — J90 Pleural effusion, not elsewhere classified: Secondary | ICD-10-CM | POA: Diagnosis not present

## 2023-10-05 DIAGNOSIS — J449 Chronic obstructive pulmonary disease, unspecified: Secondary | ICD-10-CM

## 2023-10-05 DIAGNOSIS — Z809 Family history of malignant neoplasm, unspecified: Secondary | ICD-10-CM | POA: Diagnosis not present

## 2023-10-05 DIAGNOSIS — Z7901 Long term (current) use of anticoagulants: Secondary | ICD-10-CM | POA: Diagnosis not present

## 2023-10-05 DIAGNOSIS — Z818 Family history of other mental and behavioral disorders: Secondary | ICD-10-CM

## 2023-10-05 DIAGNOSIS — Z79631 Long term (current) use of antimetabolite agent: Secondary | ICD-10-CM

## 2023-10-05 DIAGNOSIS — M069 Rheumatoid arthritis, unspecified: Secondary | ICD-10-CM | POA: Diagnosis not present

## 2023-10-05 DIAGNOSIS — Z825 Family history of asthma and other chronic lower respiratory diseases: Secondary | ICD-10-CM

## 2023-10-05 DIAGNOSIS — I2693 Single subsegmental pulmonary embolism without acute cor pulmonale: Secondary | ICD-10-CM | POA: Diagnosis not present

## 2023-10-05 DIAGNOSIS — Z8249 Family history of ischemic heart disease and other diseases of the circulatory system: Secondary | ICD-10-CM

## 2023-10-05 DIAGNOSIS — Z79899 Other long term (current) drug therapy: Secondary | ICD-10-CM | POA: Diagnosis not present

## 2023-10-05 DIAGNOSIS — D649 Anemia, unspecified: Secondary | ICD-10-CM | POA: Diagnosis not present

## 2023-10-05 DIAGNOSIS — Z1231 Encounter for screening mammogram for malignant neoplasm of breast: Secondary | ICD-10-CM

## 2023-10-05 DIAGNOSIS — I871 Compression of vein: Secondary | ICD-10-CM | POA: Diagnosis not present

## 2023-10-05 DIAGNOSIS — I2699 Other pulmonary embolism without acute cor pulmonale: Secondary | ICD-10-CM

## 2023-10-05 NOTE — Patient Instructions (Addendum)
 You were seen and examined today by Dr. Ellin Saba. Dr. Ellin Saba is a hematologist, meaning that he specializes in blood abnormalities. Dr. Ellin Saba discussed your past medical history, family history of cancers/blood conditions and the events that led to you being here today.  You were referred to Dr. Ellin Saba due to pulmonary embolism (blood clot in the lungs).  Dr. Ellin Saba reviewed the results of your lab work that was done in the hospital. It was all normal.   Since there was no provoking factor causing your blood clots, Dr. Kirtland Bouchard recommends you stay on Eliquis indefinitely.   We will see you back in 6 months. We will repeat lab work prior to this visit.   Follow-up as scheduled.

## 2023-10-13 ENCOUNTER — Ambulatory Visit (INDEPENDENT_AMBULATORY_CARE_PROVIDER_SITE_OTHER): Admitting: Internal Medicine

## 2023-10-13 ENCOUNTER — Encounter: Payer: Self-pay | Admitting: Internal Medicine

## 2023-10-13 VITALS — BP 123/74 | HR 115 | Ht 63.9 in | Wt 118.0 lb

## 2023-10-13 DIAGNOSIS — K219 Gastro-esophageal reflux disease without esophagitis: Secondary | ICD-10-CM

## 2023-10-13 DIAGNOSIS — J309 Allergic rhinitis, unspecified: Secondary | ICD-10-CM | POA: Diagnosis not present

## 2023-10-13 DIAGNOSIS — I2699 Other pulmonary embolism without acute cor pulmonale: Secondary | ICD-10-CM | POA: Diagnosis not present

## 2023-10-13 DIAGNOSIS — R42 Dizziness and giddiness: Secondary | ICD-10-CM | POA: Diagnosis not present

## 2023-10-13 DIAGNOSIS — Z09 Encounter for follow-up examination after completed treatment for conditions other than malignant neoplasm: Secondary | ICD-10-CM

## 2023-10-13 DIAGNOSIS — I2694 Multiple subsegmental pulmonary emboli without acute cor pulmonale: Secondary | ICD-10-CM

## 2023-10-13 DIAGNOSIS — R760 Raised antibody titer: Secondary | ICD-10-CM | POA: Insufficient documentation

## 2023-10-13 MED ORDER — LEVOCETIRIZINE DIHYDROCHLORIDE 5 MG PO TABS: 5.0000 mg | ORAL_TABLET | Freq: Every evening | ORAL | 5 refills | Status: DC

## 2023-10-13 MED ORDER — FAMOTIDINE 40 MG PO TABS: 40.0000 mg | ORAL_TABLET | Freq: Every day | ORAL | 1 refills | Status: DC

## 2023-10-13 NOTE — Assessment & Plan Note (Signed)
 Had unprovoked PE recently On Eliquis now Followed by Hematology

## 2023-10-13 NOTE — Assessment & Plan Note (Signed)
 Her gastric pain is likely due to gastritis/GERD due to poor p.o. intake Needs to eat at regular intervals Continue Pepcid 40 mg QD as needed

## 2023-10-13 NOTE — Assessment & Plan Note (Signed)
 Most likely due to dehydration from poor p.o. intake of fluids Needs to maintain at least 64 ounces of fluid intake in a day

## 2023-10-13 NOTE — Progress Notes (Signed)
 Established Patient Office Visit  Subjective:  Patient ID: Laurie Wallace, female    DOB: 07-10-1945  Age: 78 y.o. MRN: [id number]  CC:  Chief Complaint  Patient presents with   Follow-up    D/c on 03/27, reports some dizziness at times, and nausea at times.     HPI Laurie Wallace is a 78 y.o. female with past medical history of RA and osteoporosis who presents for follow-up after recent hospitalization from 09/30/23-10/01/23.  She was told to go to ER due to chest pain, dyspnea and hemoptysis. She had leg swelling for 2 days prior to ER visit. In ED CTA chest was obtained, significant for multiple subsegmental PEs, she was noted to be tachycardic, but troponins within normal limit, no hypoxia, she was started on Eliquis. She remained stable and was discharged on DOAC.   She does not report chest pain, dyspnea or palpitations currently.  She has mild cough, which is related to allergic sinusitis.  Denies any fever or chills.  She is tolerating Eliquis well, denies any overt signs of bleeding.  She reports episodes of dizziness, describes as feeling woozy, but denies room spinning sensation. She admits that she needs to improve fluid intake. She has mild nausea at times, takes Pepcid for GERD.     Past Medical History:  Diagnosis Date   Allergy    Arthritis    COPD (chronic obstructive pulmonary disease) (HCC)     Past Surgical History:  Procedure Laterality Date   ABDOMINAL HYSTERECTOMY     TOE SURGERY Right    1st toe    Family History  Problem Relation Age of Onset   Cancer Mother    Cancer Father    Heart failure Sister    COPD Sister    Dementia Sister     Social History   Socioeconomic History   Marital status: Single    Spouse name: Not on file   Number of children: Not on file   Years of education: Not on file   Highest education level: Not on file  Occupational History   Occupation: full time  Tobacco Use   Smoking status: Never     Passive exposure: Never   Smokeless tobacco: Never  Vaping Use   Vaping status: Never Used  Substance and Sexual Activity   Alcohol use: No   Drug use: No   Sexual activity: Not Currently  Other Topics Concern   Not on file  Social History Narrative   Lives with her son   Right Handed   Drinks 1-2 cups caffeine daily in winter   Social Drivers of Health   Financial Resource Strain: Low Risk  (06/14/2023)   Overall Financial Resource Strain (CARDIA)    Difficulty of Paying Living Expenses: Not hard at all  Food Insecurity: No Food Insecurity (09/29/2023)   Hunger Vital Sign    Worried About Running Out of Food in the Last Year: Never true    Ran Out of Food in the Last Year: Never true  Transportation Needs: No Transportation Needs (09/29/2023)   PRAPARE - Administrator, Civil Service (Medical): No    Lack of Transportation (Non-Medical): No  Physical Activity: Sufficiently Active (06/14/2023)   Exercise Vital Sign    Days of Exercise per Week: 5 days    Minutes of Exercise per Session: 30 min  Stress: No Stress Concern Present (06/14/2023)   [employer] of Occupational Health - Occupational Stress Questionnaire  Feeling of Stress : Not at all  Social Connections: Moderately Isolated (09/29/2023)   Social Connection and Isolation Panel [NHANES]    Frequency of Communication with Friends and Family: More than three times a week    Frequency of Social Gatherings with Friends and Family: More than three times a week    Attends Religious Services: More than 4 times per year    Active Member of [employer] or Organizations: No    Attends Banker Meetings: Never    Marital Status: Widowed  Intimate Partner Violence: Not At Risk (09/29/2023)   Humiliation, Afraid, Rape, and Kick questionnaire    Fear of Current or Ex-Partner: No    Emotionally Abused: No    Physically Abused: No    Sexually Abused: No    Outpatient Medications Prior to Visit   Medication Sig Dispense Refill   alendronate (FOSAMAX) 70 MG tablet Take 1 tablet (70 mg total) by mouth every 7 (seven) days. Take with a full glass of water on an empty stomach. (Patient taking differently: Take 70 mg by mouth every Monday.) 4 tablet 11   apixaban (ELIQUIS) 5 MG TABS tablet Take 2 tablets (10mg ) twice daily for 7 days, then 1 tablet (5mg ) twice daily 60 tablet 1   folic acid (FOLVITE) 1 MG tablet Take 1 tablet (1 mg total) by mouth daily. 90 tablet 3   Iron, Ferrous Sulfate, 325 (65 Fe) MG TABS Take 325 mg by mouth daily. 30 tablet 5   methotrexate (RHEUMATREX) 2.5 MG tablet Take 6 tablets (15 mg total) by mouth once a week. Caution:Chemotherapy. Protect from light. 72 tablet 0   famotidine (PEPCID) 40 MG tablet TAKE ONE TABLET BY MOUTH EVERY DAY 30 tablet 1   levocetirizine (XYZAL) 5 MG tablet Take 1 tablet (5 mg total) by mouth every evening. 30 tablet 5   No facility-administered medications prior to visit.    No Known Allergies  ROS Review of Systems  Constitutional:  Negative for chills and fever.  HENT:  Positive for congestion and postnasal drip. Negative for sinus pressure, sinus pain and sore throat.   Eyes:  Negative for pain and discharge.  Respiratory:  Positive for cough. Negative for shortness of breath.   Cardiovascular:  Negative for chest pain and palpitations.  Gastrointestinal:  Positive for nausea. Negative for constipation, diarrhea and vomiting.  Endocrine: Negative for polydipsia and polyuria.  Genitourinary:  Negative for dysuria and hematuria.  Musculoskeletal:  Positive for arthralgias, back pain and myalgias. Negative for neck pain and neck stiffness.  Skin:  Negative for rash.  Neurological:  Negative for dizziness and weakness.  Psychiatric/Behavioral:  Negative for agitation and behavioral problems.       Objective:    Physical Exam Vitals reviewed.  Constitutional:      General: She is not in acute distress.    Appearance: She  is not diaphoretic.  HENT:     Head: Normocephalic and atraumatic.     Nose: Nose normal.     Mouth/Throat:     Mouth: Mucous membranes are moist.  Eyes:     General: No scleral icterus.    Extraocular Movements: Extraocular movements intact.  Cardiovascular:     Rate and Rhythm: Normal rate and regular rhythm.     Heart sounds: Normal heart sounds. No murmur heard. Pulmonary:     Breath sounds: Normal breath sounds. No wheezing or rales.  Abdominal:     Palpations: Abdomen is soft.  Tenderness: There is no abdominal tenderness.  Musculoskeletal:        General: Deformity (Small joints of b/l hands with mild ulnar deviation) present.     Cervical back: Neck supple. No tenderness.     Lumbar back: Tenderness present. Negative right straight leg raise test and negative left straight leg raise test.     Right lower leg: No edema.     Left lower leg: No edema.  Skin:    General: Skin is warm.     Findings: No rash.  Neurological:     General: No focal deficit present.     Mental Status: She is alert and oriented to person, place, and time.     Sensory: No sensory deficit.     Motor: No weakness.  Psychiatric:        Mood and Affect: Mood normal.        Behavior: Behavior normal.     BP 123/74   Pulse (!) 115   Ht 5' 3.9" (1.623 m)   Wt 118 lb (53.5 kg)   SpO2 95%   BMI 20.32 kg/m  Wt Readings from Last 3 Encounters:  10/13/23 118 lb (53.5 kg)  10/05/23 118 lb 9.7 oz (53.8 kg)  09/29/23 117 lb 8.1 oz (53.3 kg)    Lab Results  Component Value Date   TSH 1.950 10/17/2020   Lab Results  Component Value Date   WBC 12.5 (H) 09/30/2023   HGB 10.6 (L) 09/30/2023   HCT 31.6 (L) 09/30/2023   MCV 97.5 09/30/2023   PLT 366 09/30/2023   Lab Results  Component Value Date   NA 139 09/30/2023   K 4.1 09/30/2023   CO2 24 09/30/2023   GLUCOSE 110 (H) 09/30/2023   BUN 11 09/30/2023   CREATININE 0.82 09/30/2023   BILITOT 0.3 09/29/2023   ALKPHOS 112 09/29/2023   AST  24 09/29/2023   ALT 21 09/29/2023   PROT 7.6 09/29/2023   ALBUMIN 3.7 09/29/2023   CALCIUM 9.0 09/30/2023   ANIONGAP 11 09/30/2023   EGFR 81 06/24/2023   No results found for: "CHOL" No results found for: "HDL" No results found for: "LDLCALC" No results found for: "TRIG" No results found for: "CHOLHDL" Lab Results  Component Value Date   HGBA1C 5.8 (H) 01/10/2023      Assessment & Plan:   Problem List Items Addressed This Visit       Cardiovascular and Mediastinum   Pulmonary embolism (HCC) - Primary   Unprovoked PE noted on CT chest On Eliquis 5 mg twice daily Had lupus anticoagulant positive, followed by Hematology      Relevant Orders   CBC with Differential/Platelet   Basic Metabolic Panel (BMET)     Respiratory   Allergic sinusitis   Continue Xyzal Flonase or Astelin nasal spray for nasal congestion/allergies      Relevant Medications   levocetirizine (XYZAL) 5 MG tablet     Digestive   Gastroesophageal reflux disease   Her gastric pain is likely due to gastritis/GERD due to poor p.o. intake Needs to eat at regular intervals Continue Pepcid 40 mg QD as needed      Relevant Medications   famotidine (PEPCID) 40 MG tablet     Hematopoietic and Hemostatic   Lupus anticoagulant positive   Had unprovoked PE recently On Eliquis now Followed by Hematology        Other   Hospital discharge follow-up   Hospital chart reviewed, including discharge summary Medications reconciled  and reviewed with the patient in detail Check CBC and BMP      Relevant Orders   CBC with Differential/Platelet   Basic Metabolic Panel (BMET)   Dizziness   Most likely due to dehydration from poor p.o. intake of fluids Needs to maintain at least 64 ounces of fluid intake in a day       Meds ordered this encounter  Medications   famotidine (PEPCID) 40 MG tablet    Sig: Take 1 tablet (40 mg total) by mouth daily.    Dispense:  90 tablet    Refill:  1    levocetirizine (XYZAL) 5 MG tablet    Sig: Take 1 tablet (5 mg total) by mouth every evening.    Dispense:  30 tablet    Refill:  5    Follow-up: Return if symptoms worsen or fail to improve.    Anabel Halon, MD

## 2023-10-13 NOTE — Patient Instructions (Addendum)
 Please schedule Bone Density with Mammogram appt.  Please continue to take medications as prescribed.  Please maintain at least 64 ounces of fluid intake in a day.  Please avoid sudden positional changes.

## 2023-10-13 NOTE — Assessment & Plan Note (Addendum)
Hospital chart reviewed, including discharge summary Medications reconciled and reviewed with the patient in detail Check CBC and BMP

## 2023-10-13 NOTE — Assessment & Plan Note (Signed)
 Unprovoked PE noted on CT chest On Eliquis 5 mg twice daily Had lupus anticoagulant positive, followed by Hematology

## 2023-10-13 NOTE — Assessment & Plan Note (Addendum)
 Continue Xyzal Flonase or Astelin nasal spray for nasal congestion/allergies

## 2023-10-14 LAB — CBC WITH DIFFERENTIAL/PLATELET

## 2023-10-15 LAB — CBC WITH DIFFERENTIAL/PLATELET
Basophils Absolute: 0.1 10*3/uL (ref 0.0–0.2)
Basos: 1 %
EOS (ABSOLUTE): 0.5 10*3/uL — ABNORMAL HIGH (ref 0.0–0.4)
Eos: 4 %
Hematocrit: 33.1 % — ABNORMAL LOW (ref 34.0–46.6)
Hemoglobin: 10.8 g/dL — ABNORMAL LOW (ref 11.1–15.9)
Lymphocytes Absolute: 3.1 10*3/uL (ref 0.7–3.1)
Lymphs: 24 %
MCH: 33.1 pg — ABNORMAL HIGH (ref 26.6–33.0)
MCHC: 32.6 g/dL (ref 31.5–35.7)
MCV: 102 fL — ABNORMAL HIGH (ref 79–97)
Monocytes Absolute: 2.6 10*3/uL — ABNORMAL HIGH (ref 0.1–0.9)
Monocytes: 20 %
Neutrophils Absolute: 6.5 10*3/uL (ref 1.4–7.0)
Neutrophils: 50 %
RBC: 3.26 x10E6/uL — ABNORMAL LOW (ref 3.77–5.28)
RDW: 18.6 % — ABNORMAL HIGH (ref 11.7–15.4)
WBC: 13 10*3/uL — ABNORMAL HIGH (ref 3.4–10.8)

## 2023-10-15 LAB — BASIC METABOLIC PANEL WITH GFR
BUN/Creatinine Ratio: 10 — ABNORMAL LOW (ref 12–28)
BUN: 9 mg/dL (ref 8–27)
CO2: 22 mmol/L (ref 20–29)
Calcium: 9.2 mg/dL (ref 8.7–10.3)
Chloride: 103 mmol/L (ref 96–106)
Creatinine, Ser: 0.9 mg/dL (ref 0.57–1.00)
Glucose: 92 mg/dL (ref 70–99)
Potassium: 4.1 mmol/L (ref 3.5–5.2)
Sodium: 140 mmol/L (ref 134–144)
eGFR: 66 mL/min/{1.73_m2} (ref >59)

## 2023-10-15 LAB — IMMATURE CELLS: MYELOCYTES: 1 % — ABNORMAL HIGH (ref 0–0)

## 2023-10-21 ENCOUNTER — Ambulatory Visit (HOSPITAL_COMMUNITY)
Admission: RE | Admit: 2023-10-21 | Discharge: 2023-10-21 | Disposition: A | Discharge status: Home / Self Care | Source: Ambulatory Visit | Attending: Hematology | Admitting: Hematology

## 2023-10-21 ENCOUNTER — Ambulatory Visit (HOSPITAL_COMMUNITY)
Admission: RE | Admit: 2023-10-21 | Discharge: 2023-10-21 | Disposition: A | Discharge status: Home / Self Care | Source: Ambulatory Visit | Attending: Internal Medicine | Admitting: Internal Medicine

## 2023-10-21 ENCOUNTER — Other Ambulatory Visit: Payer: Self-pay | Admitting: *Deleted

## 2023-10-21 ENCOUNTER — Encounter (HOSPITAL_COMMUNITY): Payer: Self-pay

## 2023-10-21 DIAGNOSIS — Z780 Asymptomatic menopausal state: Secondary | ICD-10-CM | POA: Diagnosis not present

## 2023-10-21 DIAGNOSIS — Z1231 Encounter for screening mammogram for malignant neoplasm of breast: Secondary | ICD-10-CM | POA: Insufficient documentation

## 2023-10-21 DIAGNOSIS — M810 Age-related osteoporosis without current pathological fracture: Secondary | ICD-10-CM | POA: Diagnosis not present

## 2023-10-21 MED ORDER — APIXABAN 5 MG PO TABS: 5.0000 mg | ORAL_TABLET | Freq: Two times a day (BID) | ORAL | 3 refills | Status: DC

## 2023-10-21 MED ORDER — WARFARIN SODIUM 5 MG PO TABS: 5.0000 mg | ORAL_TABLET | Freq: Every day | ORAL | 2 refills | Status: DC

## 2023-10-22 ENCOUNTER — Other Ambulatory Visit (HOSPITAL_BASED_OUTPATIENT_CLINIC_OR_DEPARTMENT_OTHER): Payer: Self-pay

## 2023-10-25 ENCOUNTER — Encounter: Payer: Self-pay | Admitting: *Deleted

## 2023-10-25 ENCOUNTER — Telehealth: Payer: Self-pay

## 2023-10-25 NOTE — Telephone Encounter (Signed)
 I dealt with this patient on this on Thursday and will reach out to her granddaughter to have further discussion.

## 2023-10-25 NOTE — Telephone Encounter (Signed)
 Called the pharmacy states they spoke to pt let her know she has a deductible, medication was going to be changed to warfarin but pt declined and agreed to just go with eliquis , please advice on any other changes if needed. However pt is stating medication is too expensive now wants an alternative.

## 2023-10-25 NOTE — Telephone Encounter (Signed)
 Pharmacy is requesting change from prescriber

## 2023-10-25 NOTE — Telephone Encounter (Signed)
 Copied from CRM [id number]. Topic: Clinical - Prescription Issue >> Oct 22, 2023  2:23 PM Baldemar Lev wrote: Reason for CRM: Pt called reporting that a particular prescription is too expensive, she does not know the name of the medicine. Says it is for blood clots. Seeking an assistance card or an alternative option that is more affordable.

## 2023-10-25 NOTE — Progress Notes (Signed)
 Patient's niece, Ivin Marrow contacted our office on 4/17 stating that she is having a difficult time affording her Eliquis  at $299 a month.  Lovenox  bridge to coumadin  was offered by Dr. Katragadda, however patient is not wanting to switch due to dietary restrictions and frequent lab draws, once educated.  I recommended that they contact Eliquis  to see if she qualified for any assistance through the company, however she did not qualify.  Spoke to her about patient's current Medicare and Medicaid plans, encouraging her to apply for standard Medicare with part D plan for medication coverage and Medicaid, as she currently has the family planning policy.  Verbalized understanding and will work on getting new policies in place.

## 2023-10-27 ENCOUNTER — Telehealth: Payer: Self-pay | Admitting: Internal Medicine

## 2023-10-27 NOTE — Telephone Encounter (Signed)
 Pt granddaughter calling says pt is struggling with a cough- scheduled pt for tomorrow morning with Rice Chamorro but granddaughter would like a call back in regard to recommendations until the appointment. Please advise Thank you

## 2023-10-28 ENCOUNTER — Ambulatory Visit (INDEPENDENT_AMBULATORY_CARE_PROVIDER_SITE_OTHER)

## 2023-10-28 VITALS — BP 126/71 | HR 104 | Ht 65.0 in | Wt 114.1 lb

## 2023-10-28 DIAGNOSIS — J0100 Acute maxillary sinusitis, unspecified: Secondary | ICD-10-CM

## 2023-10-28 MED ORDER — PROMETHAZINE-DM 6.25-15 MG/5ML PO SYRP: 2.5000 mL | ORAL_SOLUTION | Freq: Four times a day (QID) | ORAL | 0 refills | Status: DC | PRN

## 2023-10-28 MED ORDER — AZITHROMYCIN 250 MG PO TABS: ORAL_TABLET | ORAL | 0 refills | Status: AC

## 2023-10-28 MED ORDER — METHYLPREDNISOLONE 4 MG PO TBPK: ORAL_TABLET | ORAL | 0 refills | Status: DC

## 2023-10-28 NOTE — Progress Notes (Unsigned)
   Acute Office Visit  Subjective:     Patient ID: Laurie Wallace, female    DOB: 05-15-1946, 78 y.o.   MRN: [id number]  Chief Complaint  Patient presents with   Medical Management of Chronic Issues    Pt states she's had a Cough/Runny since Sunday and been outside working"    HPI Patient is in today for Upper Respiratory Infection: Patient complains of {uri rfv:14243::"symptoms of a URI"}. Symptoms include {otitis symptoms:327}. Onset of symptoms was {numbers; 0-10:33138} {time units:11} ago, {clinical course - history:17::"unchanged"} since that time. She also c/o {uri sx:15453} for the past {numbers 1-16:15321} {time; units w/plural:11} .  She {hydration history:15378}. Evaluation to date: {uri eval:14242::"none"}. Treatment to date: {uri tx:14268}.    ROS      Objective:    BP 126/71   Pulse (!) 104   Ht 5\' 5"  (1.651 m)   Wt 114 lb 1.9 oz (51.8 kg)   SpO2 97%   BMI 18.99 kg/m  {Vitals History (Optional):23777}  Physical Exam  No results found for any visits on 10/28/23.      Assessment & Plan:   Problem List Items Addressed This Visit   None   No orders of the defined types were placed in this encounter.   No follow-ups on file.  Alison Irvine, FNP

## 2023-10-31 ENCOUNTER — Emergency Department (HOSPITAL_COMMUNITY)

## 2023-10-31 ENCOUNTER — Emergency Department (HOSPITAL_COMMUNITY)
Admission: EM | Admit: 2023-10-31 | Discharge: 2023-10-31 | Disposition: A | Discharge status: Home / Self Care | Attending: Emergency Medicine | Admitting: Emergency Medicine

## 2023-10-31 ENCOUNTER — Encounter (HOSPITAL_COMMUNITY): Payer: Self-pay | Admitting: Emergency Medicine

## 2023-10-31 ENCOUNTER — Other Ambulatory Visit: Payer: Self-pay

## 2023-10-31 DIAGNOSIS — D72829 Elevated white blood cell count, unspecified: Secondary | ICD-10-CM | POA: Insufficient documentation

## 2023-10-31 DIAGNOSIS — J449 Chronic obstructive pulmonary disease, unspecified: Secondary | ICD-10-CM | POA: Diagnosis not present

## 2023-10-31 DIAGNOSIS — U071 COVID-19: Secondary | ICD-10-CM | POA: Insufficient documentation

## 2023-10-31 DIAGNOSIS — Z7901 Long term (current) use of anticoagulants: Secondary | ICD-10-CM | POA: Diagnosis not present

## 2023-10-31 DIAGNOSIS — J0100 Acute maxillary sinusitis, unspecified: Secondary | ICD-10-CM | POA: Diagnosis not present

## 2023-10-31 DIAGNOSIS — J0101 Acute recurrent maxillary sinusitis: Secondary | ICD-10-CM | POA: Diagnosis not present

## 2023-10-31 DIAGNOSIS — R519 Headache, unspecified: Secondary | ICD-10-CM | POA: Diagnosis not present

## 2023-10-31 LAB — CBC WITH DIFFERENTIAL/PLATELET
Abs Immature Granulocytes: 0.3 10*3/uL — ABNORMAL HIGH (ref 0.00–0.07)
Basophils Absolute: 0 10*3/uL (ref 0.0–0.1)
Basophils Relative: 0 %
Eosinophils Absolute: 0.5 10*3/uL (ref 0.0–0.5)
Eosinophils Relative: 2 %
HCT: 29.8 % — ABNORMAL LOW (ref 36.0–46.0)
Hemoglobin: 9.8 g/dL — ABNORMAL LOW (ref 12.0–15.0)
Lymphocytes Relative: 19 %
Lymphs Abs: 4.9 10*3/uL — ABNORMAL HIGH (ref 0.7–4.0)
MCH: 33.1 pg (ref 26.0–34.0)
MCHC: 32.9 g/dL (ref 30.0–36.0)
MCV: 100.7 fL — ABNORMAL HIGH (ref 80.0–100.0)
Monocytes Absolute: 4.9 10*3/uL — ABNORMAL HIGH (ref 0.1–1.0)
Monocytes Relative: 19 %
Myelocytes: 1 %
Neutro Abs: 15.2 10*3/uL — ABNORMAL HIGH (ref 1.7–7.7)
Neutrophils Relative %: 59 %
Platelets: 771 10*3/uL — ABNORMAL HIGH (ref 150–400)
RBC: 2.96 MIL/uL — ABNORMAL LOW (ref 3.87–5.11)
RDW: 19.9 % — ABNORMAL HIGH (ref 11.5–15.5)
Smear Review: NORMAL
WBC: 25.8 10*3/uL — ABNORMAL HIGH (ref 4.0–10.5)
nRBC: 0.2 % (ref 0.0–0.2)

## 2023-10-31 LAB — BASIC METABOLIC PANEL WITH GFR
Anion gap: 11 (ref 5–15)
BUN: 9 mg/dL (ref 8–23)
CO2: 23 mmol/L (ref 22–32)
Calcium: 9.3 mg/dL (ref 8.9–10.3)
Chloride: 101 mmol/L (ref 98–111)
Creatinine, Ser: 0.93 mg/dL (ref 0.44–1.00)
GFR, Estimated: >60 mL/min (ref >60)
Glucose, Bld: 114 mg/dL — ABNORMAL HIGH (ref 70–99)
Potassium: 3.6 mmol/L (ref 3.5–5.1)
Sodium: 135 mmol/L (ref 135–145)

## 2023-10-31 LAB — RESP PANEL BY RT-PCR (RSV, FLU A&B, COVID)  RVPGX2
Influenza A by PCR: NEGATIVE
Influenza B by PCR: NEGATIVE
Resp Syncytial Virus by PCR: NEGATIVE
SARS Coronavirus 2 by RT PCR: POSITIVE — AB

## 2023-10-31 LAB — GROUP A STREP BY PCR: Group A Strep by PCR: NOT DETECTED

## 2023-10-31 MED ORDER — AMOXICILLIN-POT CLAVULANATE 875-125 MG PO TABS: 1.0000 | ORAL_TABLET | Freq: Two times a day (BID) | ORAL | 0 refills | Status: DC

## 2023-10-31 MED ORDER — AMOXICILLIN-POT CLAVULANATE 875-125 MG PO TABS
1.0000 | ORAL_TABLET | Freq: Once | ORAL | Status: AC
  Administered 2023-10-31: 1 via ORAL
  Filled 2023-10-31: qty 1

## 2023-10-31 MED ORDER — FLUTICASONE PROPIONATE 50 MCG/ACT NA SUSP: 1.0000 | Freq: Every day | NASAL | 2 refills | Status: DC

## 2023-10-31 MED ORDER — DIPHENHYDRAMINE HCL 50 MG/ML IJ SOLN
12.5000 mg | Freq: Once | INTRAMUSCULAR | Status: AC
  Administered 2023-10-31: 12.5 mg via INTRAVENOUS
  Filled 2023-10-31: qty 1

## 2023-10-31 MED ORDER — SODIUM CHLORIDE 0.9 % IV BOLUS
500.0000 mL | Freq: Once | INTRAVENOUS | Status: AC
  Administered 2023-10-31: 500 mL via INTRAVENOUS

## 2023-10-31 MED ORDER — METOCLOPRAMIDE HCL 5 MG/ML IJ SOLN
10.0000 mg | Freq: Once | INTRAMUSCULAR | Status: AC
  Administered 2023-10-31: 10 mg via INTRAVENOUS
  Filled 2023-10-31: qty 2

## 2023-10-31 NOTE — ED Notes (Signed)
 Pt/family received d/c paperwork at this time. After going over the paperwork any questions, comments, or concerns were answered to the best of this nurse's knowledge. The pt/family verbally acknowledged the teachings/instructions.

## 2023-10-31 NOTE — ED Triage Notes (Signed)
 Pt here with c/o headache that started on Wednesday and has progressively gotten worse. Pt also c/o sore throat. States was seen and treated for a sinus infection and allergies by her PCP on Wednesday. Has not tried any OTC pain relief other than meds given by PCP although she does not know name of medication prescribed.

## 2023-11-01 NOTE — ED Provider Notes (Signed)
 [facility] AT [facility] Provider Note   CSN: [id number] Arrival date & time: 10/31/23  1953     History {Add pertinent medical, surgical, social history, OB history to HPI:1} Chief Complaint  Patient presents with  . Sore Throat  . Headache    Laurie Wallace is a 78 y.o. female.  Pt is a 78 yo female with pmhx significant for copd, arthritis, and hx PE (on Eliquis ).        Home Medications Prior to Admission medications   Medication Sig Start Date End Date Taking? Authorizing Provider  amoxicillin -clavulanate (AUGMENTIN ) 875-125 MG tablet Take 1 tablet by mouth every 12 (twelve) hours. 10/31/23  Yes Dayle Sherpa, MD  fluticasone (FLONASE) 50 MCG/ACT nasal spray Place 1 spray into both nostrils daily. 10/31/23  Yes Sueellen Emery, MD  alendronate  (FOSAMAX ) 70 MG tablet Take 1 tablet (70 mg total) by mouth every 7 (seven) days. Take with a full glass of water on an empty stomach. Patient taking differently: Take 70 mg by mouth every Monday. 01/14/22   Meldon Sport, MD  apixaban  (ELIQUIS ) 5 MG TABS tablet Take 1 tablet (5 mg total) by mouth 2 (two) times daily. 10/21/23   Paulett Boros, MD  azithromycin  (ZITHROMAX ) 250 MG tablet Take 2 tablets on day 1, then 1 tablet daily on days 2 through 5 10/28/23 11/02/23  Alison Irvine, FNP  famotidine  (PEPCID ) 40 MG tablet Take 1 tablet (40 mg total) by mouth daily. 10/13/23   Meldon Sport, MD  folic acid  (FOLVITE ) 1 MG tablet Take 1 tablet (1 mg total) by mouth daily. 12/22/22   Rice, Haig Levan, MD  Iron , Ferrous Sulfate , 325 (65 Fe) MG TABS Take 325 mg by mouth daily. 02/17/23   Meldon Sport, MD  levocetirizine (XYZAL ) 5 MG tablet Take 1 tablet (5 mg total) by mouth every evening. 10/13/23   Meldon Sport, MD  methotrexate  (RHEUMATREX) 2.5 MG tablet Take 6 tablets (15 mg total) by mouth once a week. Caution:Chemotherapy. Protect from light. 06/24/23   Rice, Haig Levan, MD   methylPREDNISolone  (MEDROL  DOSEPAK) 4 MG TBPK tablet Take as package instructions. 10/28/23   Alison Irvine, FNP  promethazine -dextromethorphan (PROMETHAZINE -DM) 6.25-15 MG/5ML syrup Take 2.5 mLs by mouth 4 (four) times daily as needed for cough. 10/28/23   Alison Irvine, FNP  brompheniramine (VAZOL) 2 MG/5ML LIQD Take 2 mg by mouth 2 (two) times daily.  07/23/20  [provider]      Allergies    Patient has no known allergies.    Review of Systems   Review of Systems  Physical Exam Updated Vital Signs BP 138/63   Pulse 98   Temp 98.1 F (36.7 C) (Oral)   Resp 18   Ht 5\' 5"  (1.651 m)   Wt 52 kg   SpO2 96%   BMI 19.08 kg/m  Physical Exam  ED Results / Procedures / Treatments   Labs (all labs ordered are listed, but only abnormal results are displayed) Labs Reviewed  RESP PANEL BY RT-PCR (RSV, FLU A&B, COVID)  RVPGX2 - Abnormal; Notable for the following components:      Result Value   SARS Coronavirus 2 by RT PCR POSITIVE (*)    All other components within normal limits  CBC WITH DIFFERENTIAL/PLATELET - Abnormal; Notable for the following components:   WBC 25.8 (*)    RBC 2.96 (*)    Hemoglobin 9.8 (*)    HCT 29.8 (*)  MCV 100.7 (*)    RDW 19.9 (*)    Platelets 771 (*)    Neutro Abs 15.2 (*)    Lymphs Abs 4.9 (*)    Monocytes Absolute 4.9 (*)    Abs Immature Granulocytes 0.30 (*)    All other components within normal limits  BASIC METABOLIC PANEL WITH GFR - Abnormal; Notable for the following components:   Glucose, Bld 114 (*)    All other components within normal limits  GROUP A STREP BY PCR    EKG None  Radiology CT Head Wo Contrast Result Date: 10/31/2023 CLINICAL DATA:  Initial evaluation for acute headache. EXAM: CT HEAD WITHOUT CONTRAST TECHNIQUE: Contiguous axial images were obtained from the base of the skull through the vertex without intravenous contrast. RADIATION DOSE REDUCTION: This exam was performed according to the departmental  dose-optimization program which includes automated exposure control, adjustment of the mA and/or kV according to patient size and/or use of iterative reconstruction technique. COMPARISON:  None Available. FINDINGS: Brain: Cerebral volume within normal limits. No acute intracranial hemorrhage. No acute large vessel territory infarct. No mass lesion or midline shift. No hydrocephalus or extra-axial fluid collection. Vascular: No abnormal hyperdense vessel. Scattered vascular calcifications noted within the carotid siphons. Skull: Scalp soft tissues within normal limits for a calvarium intact. Sinuses/Orbits: Globes and orbital soft tissues within normal limits. Moderate to advanced mucosal thickening about the frontoethmoidal and visualized maxillary sinuses with a few superimposed air-fluid levels. Mastoid air cells and middle ear cavities are clear. Other: None. IMPRESSION: 1. Normal head CT.  No acute intracranial abnormality. 2. Frontoethmoidal and maxillary sinus disease with a few superimposed air-fluid levels. Clinical correlation for possible acute sinusitis recommended. Electronically Signed   By: Virgia Griffins M.D.   On: 10/31/2023 21:19    Procedures Procedures  {Document cardiac monitor, telemetry assessment procedure when appropriate:1}  Medications Ordered in ED Medications  sodium chloride  0.9 % bolus 500 mL (0 mLs Intravenous Stopped 10/31/23 2300)  metoCLOPramide (REGLAN) injection 10 mg (10 mg Intravenous Given 10/31/23 2122)  diphenhydrAMINE  (BENADRYL ) injection 12.5 mg (12.5 mg Intravenous Given 10/31/23 2122)  amoxicillin -clavulanate (AUGMENTIN ) 875-125 MG per tablet 1 tablet (1 tablet Oral Given 10/31/23 2302)    ED Course/ Medical Decision Making/ A&P   {   Click here for ABCD2, HEART and other calculatorsREFRESH Note before signing :1}                              Medical Decision Making Amount and/or Complexity of Data Reviewed Labs: ordered. Radiology:  ordered.  Risk Prescription drug management.   ***  {Document critical care time when appropriate:1} {Document review of labs and clinical decision tools ie heart score, Chads2Vasc2 etc:1}  {Document your independent review of radiology images, and any outside records:1} {Document your discussion with family members, caretakers, and with consultants:1} {Document social determinants of health affecting pt's care:1} {Document your decision making why or why not admission, treatments were needed:1} Final Clinical Impression(s) / ED Diagnoses Final diagnoses:  COVID-19  Acute non-recurrent maxillary sinusitis    Rx / DC Orders ED Discharge Orders          Ordered    amoxicillin -clavulanate (AUGMENTIN ) 875-125 MG tablet  Every 12 hours        10/31/23 2204    fluticasone (FLONASE) 50 MCG/ACT nasal spray  Daily        10/31/23 2205

## 2023-11-08 ENCOUNTER — Encounter: Payer: Self-pay | Admitting: Internal Medicine

## 2023-11-08 ENCOUNTER — Ambulatory Visit (INDEPENDENT_AMBULATORY_CARE_PROVIDER_SITE_OTHER): Payer: Self-pay | Admitting: Internal Medicine

## 2023-11-08 VITALS — BP 142/72 | HR 111 | Ht 66.0 in | Wt 117.2 lb

## 2023-11-08 DIAGNOSIS — U071 COVID-19: Secondary | ICD-10-CM | POA: Insufficient documentation

## 2023-11-08 DIAGNOSIS — J0100 Acute maxillary sinusitis, unspecified: Secondary | ICD-10-CM | POA: Diagnosis not present

## 2023-11-08 NOTE — Progress Notes (Signed)
 Established Patient Office Visit  Subjective   Patient ID: Laurie Wallace, female    DOB: 1945/07/28  Age: 78 y.o. MRN: [id number]  Chief Complaint  Patient presents with   Follow-up    ER follow up    Laurie Wallace presents today for ER follow-up.  ER presentation 4/27 endorsing persistent sore throat, headache, and sinus congestion.  Previously seen 4/24 at [facility] for an acute visit endorsing similar symptoms.  She was treated with a Medrol  Dosepak, Z-Pak, and promethazine -DM.  CT head on 4/27 showed no acute intracranial abnormalities but was concerning for frontoethmoidal and maxillary sinusitis.  COVID-19 testing was positive.  Paxlovid  contraindicated in the setting of Eliquis .  Augmentin  was prescribed given imaging findings concerning for sinusitis.  She was discharged home the same day. Laurie Wallace reports feeling well today. Her symptoms have significantly improved over the last week.  She currently endorses a mild frontal headache but is otherwise asymptomatic.  She has no additional concerns to discuss today.  Past Medical History:  Diagnosis Date   Allergy    Arthritis    COPD (chronic obstructive pulmonary disease) (HCC)    Past Surgical History:  Procedure Laterality Date   ABDOMINAL HYSTERECTOMY     TOE SURGERY Right    1st toe   Social History   Tobacco Use   Smoking status: Never    Passive exposure: Never   Smokeless tobacco: Never  Vaping Use   Vaping status: Never Used  Substance Use Topics   Alcohol use: No   Drug use: No   Family History  Problem Relation Age of Onset   Cancer Mother    Cancer Father    Heart failure Sister    COPD Sister    Dementia Sister    No Known Allergies  Review of Systems  Constitutional:  Negative for chills and fever.  HENT:  Negative for sore throat.   Respiratory:  Negative for cough and shortness of breath.   Cardiovascular:  Negative for chest pain, palpitations and leg swelling.   Gastrointestinal:  Negative for abdominal pain, blood in stool, constipation, diarrhea, nausea and vomiting.  Genitourinary:  Negative for dysuria and hematuria.  Musculoskeletal:  Negative for myalgias.  Skin:  Negative for itching and rash.  Neurological:  Positive for headaches. Negative for dizziness.  Psychiatric/Behavioral:  Negative for depression and suicidal ideas.      Objective:     BP (!) 142/72   Pulse (!) 111   Ht 5\' 6"  (1.676 m)   Wt 117 lb 3.2 oz (53.2 kg)   SpO2 95%   BMI 18.92 kg/m  BP Readings from Last 3 Encounters:  11/08/23 (!) 142/72  10/31/23 138/63  10/28/23 126/71   Physical Exam Vitals reviewed.  Constitutional:      General: She is not in acute distress.    Appearance: Normal appearance. She is not toxic-appearing.  HENT:     Head: Normocephalic and atraumatic.     Right Ear: External ear normal.     Left Ear: External ear normal.     Nose: Nose normal. No congestion or rhinorrhea.     Mouth/Throat:     Mouth: Mucous membranes are moist.     Pharynx: Oropharynx is clear. No oropharyngeal exudate or posterior oropharyngeal erythema.  Eyes:     General: No scleral icterus.    Extraocular Movements: Extraocular movements intact.     Conjunctiva/sclera: Conjunctivae normal.     Pupils: Pupils are equal, round,  and reactive to light.  Cardiovascular:     Rate and Rhythm: Normal rate and regular rhythm.     Pulses: Normal pulses.     Heart sounds: Normal heart sounds. No murmur heard.    No friction rub. No gallop.  Pulmonary:     Effort: Pulmonary effort is normal.     Breath sounds: Normal breath sounds. No wheezing, rhonchi or rales.  Abdominal:     General: Abdomen is flat. Bowel sounds are normal. There is no distension.     Palpations: Abdomen is soft.     Tenderness: There is no abdominal tenderness.  Musculoskeletal:        General: No swelling. Normal range of motion.     Cervical back: Normal range of motion.     Right lower  leg: No edema.     Left lower leg: No edema.  Lymphadenopathy:     Cervical: No cervical adenopathy.  Skin:    General: Skin is warm and dry.     Capillary Refill: Capillary refill takes less than 2 seconds.     Coloration: Skin is not jaundiced.  Neurological:     General: No focal deficit present.     Mental Status: She is alert and oriented to person, place, and time.  Psychiatric:        Mood and Affect: Mood normal.        Behavior: Behavior normal.   Last CBC Lab Results  Component Value Date   WBC 25.8 (H) 10/31/2023   HGB 9.8 (L) 10/31/2023   HCT 29.8 (L) 10/31/2023   MCV 100.7 (H) 10/31/2023   MCH 33.1 10/31/2023   RDW 19.9 (H) 10/31/2023   PLT 771 (H) 10/31/2023   Last metabolic panel Lab Results  Component Value Date   GLUCOSE 114 (H) 10/31/2023   NA 135 10/31/2023   K 3.6 10/31/2023   CL 101 10/31/2023   CO2 23 10/31/2023   BUN 9 10/31/2023   CREATININE 0.93 10/31/2023   GFRNONAA >60 10/31/2023   CALCIUM 9.3 10/31/2023   PHOS 2.1 (L) 01/10/2023   PROT 7.6 09/29/2023   ALBUMIN 3.7 09/29/2023   LABGLOB 3.2 01/29/2023   AGRATIO 1.2 10/17/2020   BILITOT 0.3 09/29/2023   ALKPHOS 112 09/29/2023   AST 24 09/29/2023   ALT 21 09/29/2023   ANIONGAP 11 10/31/2023   Last hemoglobin A1c Lab Results  Component Value Date   HGBA1C 5.8 (H) 01/10/2023   Last thyroid functions Lab Results  Component Value Date   TSH 1.950 10/17/2020     Assessment & Plan:   Problem List Items Addressed This Visit       Acute non-recurrent maxillary sinusitis   CT head from 4/27 was concerning for frontal ethmoidal and maxillary sinusitis.  Z-Pak discontinued in favor of Augmentin  x 7 days.  She will complete antibiotics tomorrow.  Symptoms have significantly improved over the last week.  Recommend completing antibiotics as previously prescribed.  No additional treatment is indicated at this time.      COVID-19 - Primary   Presenting today for ER follow-up in the setting  of recent ER presentation 4/27 as described above.  COVID-19 positive at that time.  Paxlovid  contraindicated in the setting of chronic anticoagulation.  Symptoms have significantly improved over the last week.  No additional treatment is indicated today.  She will return to care for previously scheduled follow-up in October.       Return in about 5 months (around 04/13/2024).  Tobi Fortes, MD

## 2023-11-08 NOTE — Patient Instructions (Signed)
 It was a pleasure to see you today.  Thank you for giving us  the opportunity to be involved in your care.  Below is a brief recap of your visit and next steps.  We will plan to see you again in October.  Summary ER follow up today. I am glad to see that you are feeling better. Recommend completing antibiotics as recently prescribed. Return to care if symptoms worsen or fail to completely resolve Routine follow up is scheduled for October

## 2023-11-08 NOTE — Progress Notes (Signed)
 Laurie Wallace

## 2023-11-08 NOTE — Assessment & Plan Note (Signed)
 Presenting today for ER follow-up in the setting of recent ER presentation 4/27 as described above.  COVID-19 positive at that time.  Paxlovid  contraindicated in the setting of chronic anticoagulation.  Symptoms have significantly improved over the last week.  No additional treatment is indicated today.  She will return to care for previously scheduled follow-up in October.

## 2023-11-08 NOTE — Assessment & Plan Note (Signed)
 CT head from 4/27 was concerning for frontal ethmoidal and maxillary sinusitis.  Z-Pak discontinued in favor of Augmentin  x 7 days.  She will complete antibiotics tomorrow.  Symptoms have significantly improved over the last week.  Recommend completing antibiotics as previously prescribed.  No additional treatment is indicated at this time.

## 2023-11-11 ENCOUNTER — Telehealth: Payer: Self-pay | Admitting: *Deleted

## 2023-11-11 NOTE — Telephone Encounter (Signed)
 Patient has recently experienced difficulty with the price of her Eliquis , which is running roughly $299/mo.  Had spoken with niece Saralee Cummins about assisting her with applying for Medicare part D plan and calling manufacturer for possible assistance options.  Followed up with her today and they are in the process of arranging her finances so that she can apply for part D plan.  No other needs identified at this time.

## 2023-12-09 NOTE — Progress Notes (Signed)
 Office Visit Note  Patient: Laurie Wallace             Date of Birth: Nov 26, 1945           MRN: [id number]             PCP: Meldon Sport, MD Referring: Meldon Sport, MD Visit Date: 12/23/2023   Subjective:  Follow-up   Discussed the use of AI scribe software for clinical note transcription with the patient, who gave verbal consent to proceed.  History of Present Illness   Laurie Wallace is a 78 y.o. female here for follow up for seropositive RA on methotrexate  15 mg p.o. weekly and folic acid  1 mg daily.  Since our last visit she had muliple ED encounters with multiple subsegmental pulmonary emboli with unknown cause in March and COVID-19 in May.  She was hospitalized for a pulmonary embolism and is currently on anticoagulation therapy. The initial plan was to continue this treatment for six months, but due to the unknown origin of the clots, her treatment has been extended indefinitely. Lab testing including APS Abs was negative. She is scheduled for a follow-up in six months.  She contracted COVID-19, initially mistaking it for sinus and allergy issues. After receiving treatment for sinus allergies, her condition worsened, leading her to the emergency room where she was found to have this infection.Laurie Wallace She was unaware of having COVID-19 until the hospital visit.  Her rheumatoid arthritis is currently well-managed, though she experiences pain in her shoulder blade area. No swelling in her hands or knees has been noted. Her blood counts have been low, and her white blood cell count was high during her illness.       Previous HPI 09/22/2023 Laurie Wallace is a 78 y.o. female here for follow up for seropositive RA on methotrexate  15 mg p.o. weekly and folic acid  1 mg daily.     She experiences significant pain in all her toes on both feet, severe enough to prevent wearing shoes or walking. This episode lasted for about two to three days. During this  time, she was treated with a steroid and had a brief interruption of methotrexate  for five days. No swelling in her toes or hands was noted during this episode. Outside of this episode, she manages joint pain and stiffness with over-the-counter options and has not required additional medication.   Her shoulder pain has improved and is not currently a significant issue. She has bone spurs on her heels, likely due to repetitive stress at the Achilles tendon attachment. These spurs are not currently causing significant problems.   She has been experiencing more frequent upper respiratory infections, raising concerns about potential immune suppression from her current methotrexate  dosage.   She wants to return to work after being retired for a year since March 1st. She has been advised to avoid overexertion, particularly concerning her shoulder, and to consider a gradual return to work.   No swelling in her hands or toes during the recent episode of toe pain. She felt 'a little woozy' upon waking on a recent Sunday morning.         Previous HPI 06/24/2023 Laurie Wallace is a 78 y.o. female here for follow up for follow up for seropositive RA on methotrexate  15 mg p.o. weekly and folic acid  1 mg daily.  She was recently ill with pneumonia three weeks ago, but reports overall good health. They have noticed a contracture on the palm of  their hand, which they describe as a little stiff at times but not painful. They also mention occasional soreness in their knee, but it does not seem to significantly bother them.   In terms of their rheumatoid arthritis, they report that it has been well-managed and they have not experienced significant pain or swelling.    Previous HPI 03/24/2023 Laurie Wallace is a 78 y.o. female here for follow up for seropositive RA on methotrexate  15 mg p.o. weekly and folic acid  1 mg daily.  At our last visit had discussed increasing medication due to  increased symptoms especially in upper extremities and elevated sedimentation rate.  However she was sick with colitis with severe diarrhea weakness hypokalemia and interrupted treatment for this.  She resumed her methotrexate  at 15 mg p.o. weekly after follow-up with PCP currently feels like her arthritis is doing well.  GI symptoms are mostly resolved she still has reduced appetite has been unable to regain any her unintentional weight loss.   Previous HPI 12/22/2022 [name] is a 78 y.o. female here for follow up for seropositive RA on methotrexate  20 mg p.o. weekly and folic acid  1 mg daily.  Overall she feels symptoms are doing pretty well though still describes some pain in both shoulders and stiffness in her hands in the mornings.  Some pain around the back of her neck and shoulders often more bothersome at night.  No flareup of joint swelling has not required any additional steroid treatments.  She denies any interval significant infections or antibiotics treatment.   Previous HPI 09/21/22 Laurie Wallace is a 78 y.o. female here for follow up for seropositive RA on methotrexate  20 mg p.o. weekly and folic acid  1 mg daily.  Symptoms were doing pretty well with some ongoing left shoulder pain around baseline until acutely developed pain and swelling on the back of her right forearm and wrist at the end of December.  Did not recall any preceding illness no preceding injury to the site or change in usual activities.  Concern was for cellulitis versus RA flareup and treated with course of oral doxycycline and oral prednisone  medication for 5 days.  She felt symptoms almost entirely cleared up after the first 2 days of treatment.  There is still been a small persistent swelling or nodule on the affected area ever since. Occasionally notices tenderness at the site but no pain with use.   Previous HPI 06/22/22 Laurie Wallace is a 78 y.o. female here for follow up for  seropositive RA on MTX 20 mg PO weekly and folic acid  1 mg daily. She is doing well recently, slightly less busy with work so less lifting and shoulder pain is improved. Right upper arm still with some pain and stiffness. Not seeing any swelling and hand and knee pain is all doing well. Notices small increase in symptoms with the recent cooler weather usually just with the rain.   Previous HPI 02/26/22 Cordie Beazley Dechert is a 78 y.o. female here for follow up seropositive RA.  She has been off of the p.o. methotrexate  for about a month.  She experienced increased joint pain in shoulders and knees off the medication.  She took a Medrol  Dosepak which improved joint symptoms pretty quickly.   Previous HPI 10/13/2021 Yeily Link Mizzell is a 78 y.o. female here for follow up for seropositive RA on methotrexate  20 mg PO weekly. She is doing well since last visit her shoulder pain increases on days with  poor weather or if she uses her arms a lot more than usual. Otherwise pain free on some days. She had COVID infection last month treated with paxlovid  and tessalon  and tylenol  with good improvement of symptoms.    Previous HPI 07/10/21 Tallulah Hosman Bagent is a 78 y.o. female here for follow up for seropositive RA on methotrexate  20 mg PO weekly which was increased after last visit.  She feels symptoms are overall improving she has 0 pain on some days occasionally swelling and stiffness affecting the left arm.  Left shoulder range of motion remains slightly reduced but with much less pain.  She denies any new side effects on increased methotrexate  dose.  She has regained about 5 pounds since having some unintentional weight loss last year denies any GI complaints reports normal appetite.   Previous HPI   12/11/20 DESTONY PREVOST is a 78 y.o. female here for evaluation of joint pains in multiple areas with elevated inflammatory markers.  She believes these symptoms in her shoulder started  since about 4 5 months ago without any specific preceding injury or event that she can recall.  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  She does not recall any past significant shoulder injuries or surgeries. Previous treatments tried include cymbalta  that she does not notice a significant improvement.  Since last month she was started on oral daily prednisone  for the past few weeks and has noticed a significant improvement in her joint symptoms particularly resolution of the hand swelling although does continue having some left shoulder pain.   LAbs reviewed 10/2020 ANA neg ACE neg RF neg ANCAs neg ESR 68 CRP 25 Alk phos 126 Plts 495 CK 55   Review of Systems  Constitutional:  Positive for fatigue.  HENT:  Negative for mouth sores and mouth dryness.   Eyes:  Positive for dryness.  Respiratory:  Positive for shortness of breath.   Cardiovascular:  Negative for chest pain and palpitations.  Gastrointestinal:  Negative for blood in stool, constipation and diarrhea.  Endocrine: Negative for increased urination.  Genitourinary:  Negative for involuntary urination.  Musculoskeletal:  Positive for joint pain, joint pain and muscle tenderness. Negative for gait problem, joint swelling, myalgias, muscle weakness, morning stiffness and myalgias.  Skin:  Positive for sensitivity to sunlight. Negative for color change, rash and hair loss.  Allergic/Immunologic: Positive for susceptible to infections.  Neurological:  Negative for dizziness and headaches.  Hematological:  Negative for swollen glands.  Psychiatric/Behavioral:  Positive for sleep disturbance. Negative for depressed mood. The patient is nervous/anxious.     PMFS History:  Patient Active Problem List   Diagnosis Date Noted   COVID-19 11/08/2023   Acute non-recurrent maxillary sinusitis 11/08/2023   Lupus anticoagulant positive  10/13/2023   Dizziness 10/13/2023   Pulmonary embolism (HCC) 09/29/2023   Pulmonary nodule 1 cm or greater in diameter 06/02/2023   Community acquired pneumonia of left lower lobe of lung 06/02/2023   Microcytic anemia 02/17/2023   Hospital discharge follow-up 01/18/2023   Gastroesophageal reflux disease 01/18/2023   Gait disturbance 01/18/2023   Nonintractable episodic headache 01/18/2023   Infectious colitis 01/10/2023   Hypokalemia 01/10/2023   Thrombocytosis 01/10/2023   Bilateral shoulder pain 12/22/2022   Localized swelling of right forearm 09/21/2022   Subcutaneous cyst 07/20/2022   Allergic sinusitis 01/14/2022   Encounter for general adult medical examination with abnormal findings 07/17/2021  Age-related osteoporosis without current pathological fracture 07/17/2021   Muscle cramps 02/28/2021   High risk medication use 12/26/2020   Seropositive rheumatoid arthritis (HCC) 10/25/2020   Moderate protein-calorie malnutrition (HCC) 10/25/2020   Cervical spine pain 10/23/2020   Lumbar pain 10/23/2020   Other intervertebral disc displacement, lumbar region 04/07/2010    Past Medical History:  Diagnosis Date   Allergy    Arthritis    COPD (chronic obstructive pulmonary disease) (HCC)     Family History  Problem Relation Age of Onset   Cancer Mother    Cancer Father    Heart failure Sister    COPD Sister    Dementia Sister    Past Surgical History:  Procedure Laterality Date   ABDOMINAL HYSTERECTOMY     TOE SURGERY Right    1st toe   Social History   Social History Narrative   Lives with her son   Right Handed   Drinks 1-2 cups caffeine daily in winter   Immunization History  Administered Date(s) Administered   PNEUMOCOCCAL CONJUGATE-20 02/28/2021     Objective: Vital Signs: BP 117/69 (BP Location: Left Arm, Patient Position: Sitting, Cuff Size: Normal)   Pulse 82   Resp 14   Ht 5' 6 (1.676 m)   Wt 117 lb (53.1 kg)   BMI 18.88 kg/m    Physical  Exam  Eyes:     Conjunctiva/sclera: Conjunctivae normal.    Cardiovascular:     Rate and Rhythm: Normal rate and regular rhythm.  Pulmonary:     Effort: Pulmonary effort is normal.     Comments: Bibasilar inspiratory crackles  Musculoskeletal:     Right lower leg: No edema.     Left lower leg: No edema.  Lymphadenopathy:     Cervical: No cervical adenopathy.   Skin:    General: Skin is warm and dry.     Findings: No rash.   Neurological:     Mental Status: She is alert.   Psychiatric:        Mood and Affect: Mood normal.      Musculoskeletal Exam:  Shoulders full ROM no tenderness or swelling Wrists full ROM no tenderness or swelling Fingers right first MCP severely hyperextended, Heberden's nodes and 1st CMC OA bilaterally, no palpable synovitis Mild Dupuytren's contracture worse proximal to right third digit Knees full ROM no tenderness or swelling Ankles full ROM, posterior heel bony nodules bilateral without tenderness or swelling   Investigation: No additional findings.  Imaging: No results found.  Recent Labs: Lab Results  Component Value Date   WBC 25.8 (H) 10/31/2023   HGB 9.8 (L) 10/31/2023   PLT 771 (H) 10/31/2023   NA 135 10/31/2023   K 3.6 10/31/2023   CL 101 10/31/2023   CO2 23 10/31/2023   GLUCOSE 114 (H) 10/31/2023   BUN 9 10/31/2023   CREATININE 0.93 10/31/2023   BILITOT 0.3 09/29/2023   ALKPHOS 112 09/29/2023   AST 24 09/29/2023   ALT 21 09/29/2023   PROT 7.6 09/29/2023   ALBUMIN 3.7 09/29/2023   CALCIUM 9.3 10/31/2023   GFRAA 88 09/28/2014    Speciality Comments: No specialty comments available.  Procedures:  No procedures performed Allergies: Patient has no known allergies.   Assessment / Plan:     Visit Diagnoses: Seropositive rheumatoid arthritis (HCC) - Plan: Sedimentation rate, methotrexate  (RHEUMATREX) 2.5 MG tablet, folic acid  (FOLVITE ) 1 MG tablet  Plan: Sedimentation rate Joint inflammation appears well  controlled. One episode worse during acute  illness on methotrexate . -Checking sed rate disease activity monitoring -Continue methotrexate  15 mg PO weekly and folic acid  1 mg daily  High risk medication use - methotrexate  15 mg PO weekly folic acid  1 mg daily - Plan: CBC with Differential/Platelet, Comprehensive metabolic panel with GFR Interval infection with COVID, possibly due to methotrexate -induced immunosuppression but not clear. Slight worsening in anemia thought RA treatment related per recent heme clinic eval. Rechecking CBC and CMP for medicaiton monitoring.  - Review lab results to assess immune function. - Consider decreasing methotrexate  dosage if well controlled inflammation but immune function is compromised.    Pulmonary embolism Hospitalized with pulmonary embolism, etiology unclear but suspected post-COVID-19. On indefinite anticoagulation therapy. - Continue anticoagulation therapy. - Follow up in six months.  Orders: Orders Placed This Encounter  Procedures   Sedimentation rate   CBC with Differential/Platelet   Comprehensive metabolic panel with GFR   Meds ordered this encounter  Medications   methotrexate  (RHEUMATREX) 2.5 MG tablet    Sig: Take 6 tablets (15 mg total) by mouth once a week. Caution:Chemotherapy. Protect from light.    Dispense:  72 tablet    Refill:  0   folic acid  (FOLVITE ) 1 MG tablet    Sig: Take 1 tablet (1 mg total) by mouth daily.    Dispense:  90 tablet    Refill:  3    Patient requests home delivery     Follow-Up Instructions: Return in about 3 months (around 03/24/2024) for RA on MTX f/u 3mos.   Matt Song, MD  Note - This record has been created using [employer].  Chart creation errors have been sought, but may not always  have been located. Such creation errors do not reflect on  the standard of medical care.

## 2023-12-23 ENCOUNTER — Ambulatory Visit: Attending: Internal Medicine | Admitting: Internal Medicine

## 2023-12-23 ENCOUNTER — Encounter: Payer: Self-pay | Admitting: Internal Medicine

## 2023-12-23 VITALS — BP 117/69 | HR 82 | Resp 14 | Ht 66.0 in | Wt 117.0 lb

## 2023-12-23 DIAGNOSIS — M25512 Pain in left shoulder: Secondary | ICD-10-CM | POA: Diagnosis not present

## 2023-12-23 DIAGNOSIS — M720 Palmar fascial fibromatosis [Dupuytren]: Secondary | ICD-10-CM

## 2023-12-23 DIAGNOSIS — Z79899 Other long term (current) drug therapy: Secondary | ICD-10-CM | POA: Diagnosis not present

## 2023-12-23 DIAGNOSIS — G8929 Other chronic pain: Secondary | ICD-10-CM | POA: Diagnosis not present

## 2023-12-23 DIAGNOSIS — M25511 Pain in right shoulder: Secondary | ICD-10-CM

## 2023-12-23 DIAGNOSIS — M059 Rheumatoid arthritis with rheumatoid factor, unspecified: Secondary | ICD-10-CM | POA: Diagnosis not present

## 2023-12-23 MED ORDER — METHOTREXATE SODIUM 2.5 MG PO TABS
15.0000 mg | ORAL_TABLET | ORAL | 0 refills | Status: DC
Start: 2023-12-23 — End: 2024-03-27

## 2023-12-23 MED ORDER — FOLIC ACID 1 MG PO TABS: 1.0000 mg | ORAL_TABLET | Freq: Every day | ORAL | 3 refills | Status: AC

## 2023-12-24 LAB — CBC WITH DIFFERENTIAL/PLATELET
Absolute Lymphocytes: 2607 {cells}/uL (ref 850–3900)
Absolute Monocytes: 2136 {cells}/uL — ABNORMAL HIGH (ref 200–950)
Basophils Absolute: 59 {cells}/uL (ref 0–200)
Basophils Relative: 0.6 %
Eosinophils Absolute: 519 {cells}/uL — ABNORMAL HIGH (ref 15–500)
Eosinophils Relative: 5.3 %
HCT: 32.7 % — ABNORMAL LOW (ref 35.0–45.0)
Hemoglobin: 10.6 g/dL — ABNORMAL LOW (ref 11.7–15.5)
MCH: 33.8 pg — ABNORMAL HIGH (ref 27.0–33.0)
MCHC: 32.4 g/dL (ref 32.0–36.0)
MCV: 104.1 fL — ABNORMAL HIGH (ref 80.0–100.0)
MPV: 12 fL (ref 7.5–12.5)
Monocytes Relative: 21.8 %
Neutro Abs: 4479 {cells}/uL (ref 1500–7800)
Neutrophils Relative %: 45.7 %
Platelets: 139 10*3/uL — ABNORMAL LOW (ref 140–400)
RBC: 3.14 10*6/uL — ABNORMAL LOW (ref 3.80–5.10)
RDW: 18 % — ABNORMAL HIGH (ref 11.0–15.0)
Total Lymphocyte: 26.6 %
WBC: 9.8 10*3/uL (ref 3.8–10.8)

## 2023-12-24 LAB — COMPREHENSIVE METABOLIC PANEL WITH GFR
AG Ratio: 1.9 (calc) (ref 1.0–2.5)
ALT: 120 U/L — ABNORMAL HIGH (ref 6–29)
AST: 91 U/L — ABNORMAL HIGH (ref 10–35)
Albumin: 4.4 g/dL (ref 3.6–5.1)
Alkaline phosphatase (APISO): 153 U/L (ref 37–153)
BUN/Creatinine Ratio: 13 (calc) (ref 6–22)
BUN: 13 mg/dL (ref 7–25)
CO2: 27 mmol/L (ref 20–32)
Calcium: 9.2 mg/dL (ref 8.6–10.4)
Chloride: 104 mmol/L (ref 98–110)
Creat: 1.02 mg/dL — ABNORMAL HIGH (ref 0.60–1.00)
Globulin: 2.3 g/dL (ref 1.9–3.7)
Glucose, Bld: 91 mg/dL (ref 65–99)
Potassium: 5.1 mmol/L (ref 3.5–5.3)
Sodium: 140 mmol/L (ref 135–146)
Total Bilirubin: 0.4 mg/dL (ref 0.2–1.2)
Total Protein: 6.7 g/dL (ref 6.1–8.1)
eGFR: 57 mL/min/{1.73_m2} — ABNORMAL LOW (ref >=60)

## 2023-12-24 LAB — SEDIMENTATION RATE: Sed Rate: 25 mm/h (ref 0–30)

## 2024-02-08 ENCOUNTER — Other Ambulatory Visit: Payer: Self-pay | Admitting: Hematology

## 2024-02-24 ENCOUNTER — Emergency Department (HOSPITAL_COMMUNITY)

## 2024-02-24 ENCOUNTER — Emergency Department (HOSPITAL_COMMUNITY)
Admission: EM | Admit: 2024-02-24 | Discharge: 2024-02-24 | Disposition: A | Discharge status: Home / Self Care | Attending: Emergency Medicine | Admitting: Emergency Medicine

## 2024-02-24 ENCOUNTER — Encounter (HOSPITAL_COMMUNITY): Payer: Self-pay

## 2024-02-24 ENCOUNTER — Other Ambulatory Visit: Payer: Self-pay

## 2024-02-24 DIAGNOSIS — R079 Chest pain, unspecified: Secondary | ICD-10-CM | POA: Diagnosis not present

## 2024-02-24 DIAGNOSIS — J449 Chronic obstructive pulmonary disease, unspecified: Secondary | ICD-10-CM | POA: Insufficient documentation

## 2024-02-24 DIAGNOSIS — Z7901 Long term (current) use of anticoagulants: Secondary | ICD-10-CM | POA: Insufficient documentation

## 2024-02-24 DIAGNOSIS — I8290 Acute embolism and thrombosis of unspecified vein: Secondary | ICD-10-CM | POA: Diagnosis not present

## 2024-02-24 DIAGNOSIS — Z7951 Long term (current) use of inhaled steroids: Secondary | ICD-10-CM | POA: Diagnosis not present

## 2024-02-24 DIAGNOSIS — J40 Bronchitis, not specified as acute or chronic: Secondary | ICD-10-CM | POA: Diagnosis not present

## 2024-02-24 DIAGNOSIS — J984 Other disorders of lung: Secondary | ICD-10-CM | POA: Diagnosis not present

## 2024-02-24 DIAGNOSIS — R059 Cough, unspecified: Secondary | ICD-10-CM | POA: Diagnosis not present

## 2024-02-24 DIAGNOSIS — R071 Chest pain on breathing: Secondary | ICD-10-CM | POA: Diagnosis not present

## 2024-02-24 DIAGNOSIS — R0789 Other chest pain: Secondary | ICD-10-CM | POA: Diagnosis not present

## 2024-02-24 DIAGNOSIS — M549 Dorsalgia, unspecified: Secondary | ICD-10-CM | POA: Diagnosis not present

## 2024-02-24 LAB — CBC
HCT: 29.9 % — ABNORMAL LOW (ref 36.0–46.0)
Hemoglobin: 9.8 g/dL — ABNORMAL LOW (ref 12.0–15.0)
MCH: 33.8 pg (ref 26.0–34.0)
MCHC: 32.8 g/dL (ref 30.0–36.0)
MCV: 103.1 fL — ABNORMAL HIGH (ref 80.0–100.0)
Platelets: 380 K/uL (ref 150–400)
RBC: 2.9 MIL/uL — ABNORMAL LOW (ref 3.87–5.11)
RDW: 20 % — ABNORMAL HIGH (ref 11.5–15.5)
WBC: 22.7 K/uL — ABNORMAL HIGH (ref 4.0–10.5)
nRBC: 0.2 % (ref 0.0–0.2)

## 2024-02-24 LAB — BASIC METABOLIC PANEL WITH GFR
Anion gap: 13 (ref 5–15)
BUN: 12 mg/dL (ref 8–23)
CO2: 23 mmol/L (ref 22–32)
Calcium: 9 mg/dL (ref 8.9–10.3)
Chloride: 106 mmol/L (ref 98–111)
Creatinine, Ser: 1 mg/dL (ref 0.44–1.00)
GFR, Estimated: 58 mL/min — ABNORMAL LOW (ref >60)
Glucose, Bld: 95 mg/dL (ref 70–99)
Potassium: 4 mmol/L (ref 3.5–5.1)
Sodium: 142 mmol/L (ref 135–145)

## 2024-02-24 LAB — TROPONIN I (HIGH SENSITIVITY)
Troponin I (High Sensitivity): 4 ng/L (ref <18)
Troponin I (High Sensitivity): 4 ng/L (ref <18)

## 2024-02-24 LAB — RESP PANEL BY RT-PCR (RSV, FLU A&B, COVID)  RVPGX2
Influenza A by PCR: NEGATIVE
Influenza B by PCR: NEGATIVE
Resp Syncytial Virus by PCR: NEGATIVE
SARS Coronavirus 2 by RT PCR: NEGATIVE

## 2024-02-24 LAB — D-DIMER, QUANTITATIVE: D-Dimer, Quant: 1.14 ug{FEU}/mL — ABNORMAL HIGH (ref 0.00–0.50)

## 2024-02-24 MED ORDER — IOHEXOL 350 MG/ML SOLN
75.0000 mL | Freq: Once | INTRAVENOUS | Status: AC | PRN
Start: 2024-02-24 — End: 2024-02-24
  Administered 2024-02-24: 75 mL via INTRAVENOUS

## 2024-02-24 MED ORDER — LEVOFLOXACIN 500 MG PO TABS: 500.0000 mg | ORAL_TABLET | Freq: Every day | ORAL | 0 refills | Status: DC

## 2024-02-24 NOTE — ED Notes (Signed)
 Pt/family received d/c paperwork at this time. After going over the paperwork any questions, comments, or concerns were answered to the best of this nurse's knowledge. The pt/family verbally acknowledged the teachings/instructions.

## 2024-02-24 NOTE — ED Triage Notes (Addendum)
 Pt reports hacking cough, sinus congestion, back pain and chest pain with deep breathing since Sunday.

## 2024-02-24 NOTE — ED Provider Notes (Signed)
 Patient with bronchitis and a mural thrombus in her descending thoracic aorta.  She will be placed on antibiotics for continued cough and bronchitis and she will be referred to pulmonary.  I spoke with Dr. Vonzell, vascular surgery and he stated the patient just needs to follow-up in the next month with him.  She is to stay on her blood thinner   Suzette Pac, MD 02/24/24 [id number]

## 2024-02-24 NOTE — Discharge Instructions (Signed)
 Follow-up with Dr. Magda or one of his colleagues in the next month for your blood clot.  You have also been referred to a lung specialist

## 2024-02-24 NOTE — ED Provider Notes (Signed)
 [facility] AT [facility] Provider Note   CSN: [id number] Arrival date & time: 02/24/24  1224     Patient presents with: flu like symptoms   Laurie Wallace is a 78 y.o. female.   Pt is a 78 yo female with pmhx significant for rheumatoid arthritis (on methotrexate ), COPD, PE (on Eliquis ).  Pt said she's had a cough for a few months.  It's been worse since Sunday, 8/17.  Pt said she has also developed some pain in her left chest.  It hurts sometimes when she coughs, but not all the time.         Prior to Admission medications   Medication Sig Start Date End Date Taking? Authorizing Provider  levofloxacin  (LEVAQUIN ) 500 MG tablet Take 1 tablet (500 mg total) by mouth daily. X 7 days 02/24/24  Yes Zammit, Joseph, MD  alendronate  (FOSAMAX ) 70 MG tablet Take 1 tablet (70 mg total) by mouth every 7 (seven) days. Take with a full glass of water on an empty stomach. 01/14/22   [name], MD  amoxicillin -clavulanate (AUGMENTIN ) 875-125 MG tablet Take 1 tablet by mouth every 12 (twelve) hours. Patient not taking: Reported on 12/23/2023 10/31/23   Dean Clarity, MD  ELIQUIS  5 MG TABS tablet Take 1 tablet (5 mg total) by mouth 2 (two) times daily. 02/08/24   Rogers Hai, MD  famotidine  (PEPCID ) 40 MG tablet Take 1 tablet (40 mg total) by mouth daily. 10/13/23   [name], MD  fluticasone  (FLONASE ) 50 MCG/ACT nasal spray Place 1 spray into both nostrils daily. Patient not taking: Reported on 12/23/2023 10/31/23   Dean Clarity, MD  folic acid  (FOLVITE ) 1 MG tablet Take 1 tablet (1 mg total) by mouth daily. 12/23/23   Rice, Lonni ORN, MD  Iron , Ferrous Sulfate , 325 (65 Fe) MG TABS Take 325 mg by mouth daily. 02/17/23   [name], MD  levocetirizine (XYZAL ) 5 MG tablet Take 1 tablet (5 mg total) by mouth every evening. 10/13/23   [name], MD  methotrexate  (RHEUMATREX) 2.5 MG tablet Take 6 tablets (15 mg total) by mouth once a  week. Caution:Chemotherapy. Protect from light. 12/23/23   Rice, Lonni ORN, MD  methylPREDNISolone  (MEDROL  DOSEPAK) 4 MG TBPK tablet Take as package instructions. Patient not taking: Reported on 12/23/2023 10/28/23   Bevely Doffing, FNP  promethazine -dextromethorphan (PROMETHAZINE -DM) 6.25-15 MG/5ML syrup Take 2.5 mLs by mouth 4 (four) times daily as needed for cough. Patient not taking: Reported on 12/23/2023 10/28/23   Bevely Doffing, FNP  brompheniramine (VAZOL) 2 MG/5ML LIQD Take 2 mg by mouth 2 (two) times daily.  07/23/20  [provider]    Allergies: Patient has no known allergies.    Review of Systems  Respiratory:  Positive for cough.   Cardiovascular:  Positive for chest pain.  All other systems reviewed and are negative.   Updated Vital Signs BP 124/62   Pulse 88   Temp 99.5 F (37.5 C) (Oral)   Resp 18   Ht 5' 6 (1.676 m)   Wt 50.8 kg   SpO2 99%   BMI 18.08 kg/m   Physical Exam Vitals and nursing note reviewed.  Constitutional:      Appearance: Normal appearance.  HENT:     Head: Normocephalic and atraumatic.     Right Ear: External ear normal.     Left Ear: External ear normal.     Nose: Nose normal.     Mouth/Throat:  Mouth: Mucous membranes are moist.     Pharynx: Oropharynx is clear.  Eyes:     Extraocular Movements: Extraocular movements intact.     Conjunctiva/sclera: Conjunctivae normal.     Pupils: Pupils are equal, round, and reactive to light.  Cardiovascular:     Rate and Rhythm: Normal rate and regular rhythm.     Pulses: Normal pulses.     Heart sounds: Normal heart sounds.  Pulmonary:     Effort: Pulmonary effort is normal.     Breath sounds: Normal breath sounds.  Abdominal:     General: Abdomen is flat. Bowel sounds are normal.     Palpations: Abdomen is soft.  Musculoskeletal:        General: Normal range of motion.     Cervical back: Normal range of motion and neck supple.  Skin:    General: Skin is warm.      Capillary Refill: Capillary refill takes less than 2 seconds.  Neurological:     General: No focal deficit present.     Mental Status: She is alert and oriented to person, place, and time.  Psychiatric:        Mood and Affect: Mood normal.        Behavior: Behavior normal.     (all labs ordered are listed, but only abnormal results are displayed) Labs Reviewed  BASIC METABOLIC PANEL WITH GFR - Abnormal; Notable for the following components:      Result Value   GFR, Estimated 58 (*)    All other components within normal limits  CBC - Abnormal; Notable for the following components:   WBC 22.7 (*)    RBC 2.90 (*)    Hemoglobin 9.8 (*)    HCT 29.9 (*)    MCV 103.1 (*)    RDW 20.0 (*)    All other components within normal limits  D-DIMER, QUANTITATIVE - Abnormal; Notable for the following components:   D-Dimer, Quant 1.14 (*)    All other components within normal limits  RESP PANEL BY RT-PCR (RSV, FLU A&B, COVID)  [id number]  TROPONIN I (HIGH SENSITIVITY)  TROPONIN I (HIGH SENSITIVITY)    EKG: EKG Interpretation Date/Time:  Thursday February 24 2024 12:44:01 EDT Ventricular Rate:  90 PR Interval:  142 QRS Duration:  82 QT Interval:  340 QTC Calculation: 415 R Axis:   60  Text Interpretation: Normal sinus rhythm Nonspecific ST abnormality Abnormal ECG When compared with ECG of 29-Sep-2023 16:51, No significant change was found Confirmed by Dean Clarity [phone]) on 02/24/2024 2:15:21 PM  Radiology: CT Angio Chest PE W and/or Wo Contrast Result Date: 02/24/2024 CLINICAL DATA:  Cough, back and chest pain EXAM: CT ANGIOGRAPHY CHEST WITH CONTRAST TECHNIQUE: Multidetector CT imaging of the chest was performed using the standard protocol during bolus administration of intravenous contrast. Multiplanar CT image reconstructions and MIPs were obtained to evaluate the vascular anatomy. RADIATION DOSE REDUCTION: This exam was performed according to the departmental dose-optimization program  which includes automated exposure control, adjustment of the mA and/or kV according to patient size and/or use of iterative reconstruction technique. CONTRAST:  75mL OMNIPAQUE  IOHEXOL  350 MG/ML SOLN COMPARISON:  Prior CT scan of the chest 09/29/2023 FINDINGS: Cardiovascular: Conventional 3 vessel arch anatomy. There is a progressive pedunculated wall adherent thrombus within the descending thoracic aorta measuring 0.8 cm in width and 2.3 cm in length. This has significantly progressed compared to the prior imaging from March of this year when the lesion had an appearance more  suggestive of focal irregular atheromatous plaque. There is a second irregular atheromatous plaque versus focus of wall adherent thrombus more distally in the descending thoracic aorta which is not pedunculated. Of the pulmonary artery is well opacified. Previously noted pulmonary emboli are largely resolved. No definite acute PE. The heart remains normal in size. No visible thrombus within the left ventricle, left atrial appendage or left atrium. No pericardial effusion. Mediastinum/Nodes: Unremarkable CT appearance of the thyroid gland. No suspicious mediastinal or hilar adenopathy. No soft tissue mediastinal mass. The thoracic esophagus is unremarkable. Lungs/Pleura: Biapical pleuroparenchymal scarring. Diffuse mild bronchial wall thickening. Scattered tree-in-bud micro nodularity in the right lower lobe slightly improved compared to prior. Interval improvement in previously identified areas of pulmonary infarct/hemorrhage with trace residual pleuroparenchymal scarring. No pleural effusion or pneumothorax. Upper Abdomen: No acute abnormality. Musculoskeletal: No acute fracture or aggressive appearing lytic or blastic osseous lesion. Review of the MIP images confirms the above findings. IMPRESSION: 1. Progressive pedunculated wall adherent mural thrombus within the descending thoracic aorta poses a high risk for distal embolization. Recommend  referral to vascular surgery. 2. No evidence of acute pulmonary embolus. 3. Diffuse mild bronchial wall thickening with improving tree-in-bud micro nodularity and areas of pleuroparenchymal scarring in the right lower lobe. Aortic Atherosclerosis (ICD10-I70.0). Electronically Signed   By: Wilkie Lent M.D.   On: 02/24/2024 16:16   DG Chest Port 1 View Result Date: 02/24/2024 CLINICAL DATA:  Back and chest pain with deep breathing for the past 4 days. Hacking cough. EXAM: PORTABLE CHEST 1 VIEW COMPARISON:  09/29/2023 FINDINGS: Normal-sized heart. Clear lungs with normal vascularity. Unremarkable bones. IMPRESSION: No active disease. Electronically Signed   By: Elspeth Bathe M.D.   On: 02/24/2024 13:28     Procedures   Medications Ordered in the ED  iohexol  (OMNIPAQUE ) 350 MG/ML injection 75 mL (75 mLs Intravenous Contrast Given 02/24/24 1531)                                    Medical Decision Making Amount and/or Complexity of Data Reviewed Labs: ordered. Radiology: ordered.  Risk Prescription drug management.   This patient presents to the ED for concern of cough, this involves an extensive number of treatment options, and is a complaint that carries with it a high risk of complications and morbidity.  The differential diagnosis includes covid/flu/rsv, pna   Co morbidities that complicate the patient evaluation  rheumatoid arthritis (on methotrexate ), COPD, PE (on Eliquis )   Additional history obtained:  Additional history obtained from epic chart review  Lab Tests:  I Ordered, and personally interpreted labs.  The pertinent results include:  covid/flu/rsv; trop nl; wbc elevated at 22.7, hgb 9.8; bmp nl   Imaging Studies ordered:  I ordered imaging studies including cxr and ct chest  I independently visualized and interpreted imaging which showed  CXR: No active disease.  I agree with the radiologist interpretation   Cardiac Monitoring:  The patient was  maintained on a cardiac monitor.  I personally viewed and interpreted the cardiac monitored which showed an underlying rhythm of: nsr   Medicines ordered and prescription drug management:   I have reviewed the patients home medicines and have made adjustments as needed   Test Considered:  ct   Problem List / ED Course:  Cough and congestion:  covid/flu/rsv neg.  Ct pending at shift change   Social Determinants of Health:  Lives at  home   Dispostion:  Pending at shift change     Final diagnoses:  Bronchitis  Thrombus    ED Discharge Orders          Ordered    levofloxacin  (LEVAQUIN ) 500 MG tablet  Daily        02/24/24 1800               Dean Clarity, MD 02/26/24 [phone]

## 2024-02-25 ENCOUNTER — Telehealth: Payer: Self-pay | Admitting: Vascular Surgery

## 2024-02-25 NOTE — Telephone Encounter (Signed)
 Pt appt scheduled

## 2024-02-28 ENCOUNTER — Other Ambulatory Visit: Payer: Self-pay

## 2024-02-28 DIAGNOSIS — I7411 Embolism and thrombosis of thoracic aorta: Secondary | ICD-10-CM

## 2024-03-14 NOTE — Progress Notes (Signed)
 Office Visit Note  Patient: Laurie Wallace             Date of Birth: 12/29/1945           MRN: [id number]             PCP: [name], MD Referring: [name], MD Visit Date: 03/27/2024   Subjective:   Discussed the use of AI scribe software for clinical note transcription with the patient, who gave verbal consent to proceed.  History of Present Illness   Laurie Wallace is a 78 y.o. female here for follow up for seropositive RA on methotrexate  15 mg p.o. weekly and folic acid  1 mg daily.   She has a history of blood clots in her lungs, initially identified in March of this year. Despite being on Eliquis , a new clot has developed in her descending aorta, which has increased in size over the past five months based on updated scan last month. She is scheduled for a CT scan with contrast at the end of this week to further evaluate the clot.  She experienced a previous episode of bronchitis, for which she was treated at the hospital. During this time, her white blood cell count was elevated to 22,000, which was attributed to her illness.  Her arthritis is currently well-managed with methotrexate . She denies any recent infections requiring antibiotics since the summer, when she experienced sinus and allergy issues. She mentions that her feet were swollen after increased walking during a recent trip.      Previous HPI 12/23/2023 Laurie Wallace is a 78 y.o. female here for follow up for seropositive RA on methotrexate  15 mg p.o. weekly and folic acid  1 mg daily.  Since our last visit she had muliple ED encounters with multiple subsegmental pulmonary emboli with unknown cause in March and COVID-19 in May.   She was hospitalized for a pulmonary embolism and is currently on anticoagulation therapy. The initial plan was to continue this treatment for six months, but due to the unknown origin of the clots, her treatment has been extended indefinitely. Lab  testing including APS Abs was negative. She is scheduled for a follow-up in six months.   She contracted COVID-19, initially mistaking it for sinus and allergy issues. After receiving treatment for sinus allergies, her condition worsened, leading her to the emergency room where she was found to have this infection.[name] She was unaware of having COVID-19 until the hospital visit.   Her rheumatoid arthritis is currently well-managed, though she experiences pain in her shoulder blade area. No swelling in her hands or knees has been noted. Her blood counts have been low, and her white blood cell count was high during her illness.         Previous HPI 09/22/2023 Laurie Wallace is a 78 y.o. female here for follow up for seropositive RA on methotrexate  15 mg p.o. weekly and folic acid  1 mg daily.     She experiences significant pain in all her toes on both feet, severe enough to prevent wearing shoes or walking. This episode lasted for about two to three days. During this time, she was treated with a steroid and had a brief interruption of methotrexate  for five days. No swelling in her toes or hands was noted during this episode. Outside of this episode, she manages joint pain and stiffness with over-the-counter options and has not required additional medication.   Her shoulder pain has improved and is not currently  a significant issue. She has bone spurs on her heels, likely due to repetitive stress at the Achilles tendon attachment. These spurs are not currently causing significant problems.   She has been experiencing more frequent upper respiratory infections, raising concerns about potential immune suppression from her current methotrexate  dosage.   She wants to return to work after being retired for a year since March 1st. She has been advised to avoid overexertion, particularly concerning her shoulder, and to consider a gradual return to work.   No swelling in her hands or toes during the  recent episode of toe pain. She felt 'a little woozy' upon waking on a recent Sunday morning.         Previous HPI 06/24/2023 Laurie Wallace is a 78 y.o. female here for follow up for follow up for seropositive RA on methotrexate  15 mg p.o. weekly and folic acid  1 mg daily.  She was recently ill with pneumonia three weeks ago, but reports overall good health. They have noticed a contracture on the palm of their hand, which they describe as a little stiff at times but not painful. They also mention occasional soreness in their knee, but it does not seem to significantly bother them.   In terms of their rheumatoid arthritis, they report that it has been well-managed and they have not experienced significant pain or swelling.    Previous HPI 03/24/2023 Laurie Wallace is a 78 y.o. female here for follow up for seropositive RA on methotrexate  15 mg p.o. weekly and folic acid  1 mg daily.  At our last visit had discussed increasing medication due to increased symptoms especially in upper extremities and elevated sedimentation rate.  However she was sick with colitis with severe diarrhea weakness hypokalemia and interrupted treatment for this.  She resumed her methotrexate  at 15 mg p.o. weekly after follow-up with PCP currently feels like her arthritis is doing well.  GI symptoms are mostly resolved she still has reduced appetite has been unable to regain any her unintentional weight loss.   Previous HPI 12/22/2022 Laurie Wallace is a 78 y.o. female here for follow up for seropositive RA on methotrexate  20 mg p.o. weekly and folic acid  1 mg daily.  Overall she feels symptoms are doing pretty well though still describes some pain in both shoulders and stiffness in her hands in the mornings.  Some pain around the back of her neck and shoulders often more bothersome at night.  No flareup of joint swelling has not required any additional steroid treatments.  She denies any  interval significant infections or antibiotics treatment.   Previous HPI 09/21/22 Laurie Wallace is a 78 y.o. female here for follow up for seropositive RA on methotrexate  20 mg p.o. weekly and folic acid  1 mg daily.  Symptoms were doing pretty well with some ongoing left shoulder pain around baseline until acutely developed pain and swelling on the back of her right forearm and wrist at the end of December.  Did not recall any preceding illness no preceding injury to the site or change in usual activities.  Concern was for cellulitis versus RA flareup and treated with course of oral doxycycline and oral prednisone  medication for 5 days.  She felt symptoms almost entirely cleared up after the first 2 days of treatment.  There is still been a small persistent swelling or nodule on the affected area ever since. Occasionally notices tenderness at the site but no pain with use.   Previous HPI 06/22/22  Laurie Wallace is a 78 y.o. female here for follow up for seropositive RA on MTX 20 mg PO weekly and folic acid  1 mg daily. She is doing well recently, slightly less busy with work so less lifting and shoulder pain is improved. Right upper arm still with some pain and stiffness. Not seeing any swelling and hand and knee pain is all doing well. Notices small increase in symptoms with the recent cooler weather usually just with the rain.   Previous HPI 02/26/22 Laurie Wallace is a 78 y.o. female here for follow up seropositive RA.  She has been off of the p.o. methotrexate  for about a month.  She experienced increased joint pain in shoulders and knees off the medication.  She took a Medrol  Dosepak which improved joint symptoms pretty quickly.   Previous HPI 10/13/2021 Laurie Wallace is a 78 y.o. female here for follow up for seropositive RA on methotrexate  20 mg PO weekly. She is doing well since last visit her shoulder pain increases on days with poor weather or if  she uses her arms a lot more than usual. Otherwise pain free on some days. She had COVID infection last month treated with paxlovid  and tessalon  and tylenol  with good improvement of symptoms.    Previous HPI 07/10/21 Laurie Wallace is a 78 y.o. female here for follow up for seropositive RA on methotrexate  20 mg PO weekly which was increased after last visit.  She feels symptoms are overall improving she has 0 pain on some days occasionally swelling and stiffness affecting the left arm.  Left shoulder range of motion remains slightly reduced but with much less pain.  She denies any new side effects on increased methotrexate  dose.  She has regained about 5 pounds since having some unintentional weight loss last year denies any GI complaints reports normal appetite.   Previous HPI   12/11/20 Laurie Wallace is a 78 y.o. female here for evaluation of joint pains in multiple areas with elevated inflammatory markers.  She believes these symptoms in her shoulder started since about 4 5 months ago without any specific preceding injury or event that she can recall.  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  She does not recall any past significant shoulder injuries or surgeries. Previous treatments tried include cymbalta  that she does not notice a significant improvement.  Since last month she was started on oral daily prednisone  for the past few weeks and has noticed a significant improvement in her joint symptoms particularly resolution of the hand swelling although does continue having some left shoulder pain.   LAbs reviewed 10/2020 ANA neg ACE neg RF neg ANCAs neg ESR 68 CRP 25 Alk phos 126 Plts 495 CK 55   Review of Systems  Constitutional:  Positive for fatigue.  HENT:  Positive for mouth dryness. Negative for mouth sores.   Eyes:  Negative for dryness.  Respiratory:  Positive  for cough and shortness of breath.   Cardiovascular:  Negative for chest pain and palpitations.  Gastrointestinal:  Negative for blood in stool, constipation and diarrhea.  Endocrine: Negative for increased urination.  Genitourinary:  Negative for involuntary urination.  Musculoskeletal:  Positive for joint pain, joint pain and muscle tenderness. Negative for gait problem, joint swelling, myalgias, muscle weakness, morning stiffness and myalgias.  Skin:  Negative for color change, rash, hair loss and sensitivity to sunlight.  Allergic/Immunologic: Negative for susceptible to infections.  Neurological:  Positive for headaches. Negative for dizziness.  Hematological:  Negative for swollen glands.  Psychiatric/Behavioral:  Positive for sleep disturbance. Negative for depressed mood. The patient is not nervous/anxious.     PMFS History:  Patient Active Problem List   Diagnosis Date Noted   COVID-19 11/08/2023   Acute non-recurrent maxillary sinusitis 11/08/2023   Lupus anticoagulant positive 10/13/2023   Dizziness 10/13/2023   Pulmonary embolism (HCC) 09/29/2023   Pulmonary nodule 1 cm or greater in diameter 06/02/2023   Community acquired pneumonia of left lower lobe of lung 06/02/2023   Microcytic anemia 02/17/2023   Hospital discharge follow-up 01/18/2023   Gastroesophageal reflux disease 01/18/2023   Gait disturbance 01/18/2023   Nonintractable episodic headache 01/18/2023   Infectious colitis 01/10/2023   Hypokalemia 01/10/2023   Thrombocytosis 01/10/2023   Bilateral shoulder pain 12/22/2022   Localized swelling of right forearm 09/21/2022   Subcutaneous cyst 07/20/2022   Allergic sinusitis 01/14/2022   Encounter for general adult medical examination with abnormal findings 07/17/2021   Age-related osteoporosis without current pathological fracture 07/17/2021   Muscle cramps 02/28/2021   High risk medication use 12/26/2020   Seropositive rheumatoid arthritis (HCC) 10/25/2020    Moderate protein-calorie malnutrition 10/25/2020   Cervical spine pain 10/23/2020   Lumbar pain 10/23/2020   Other intervertebral disc displacement, lumbar region 04/07/2010    Past Medical History:  Diagnosis Date   Allergy    Arthritis    COPD (chronic obstructive pulmonary disease) (HCC)     Family History  Problem Relation Age of Onset   Cancer Mother    Cancer Father    Heart failure Sister    COPD Sister    Dementia Sister    Past Surgical History:  Procedure Laterality Date   ABDOMINAL HYSTERECTOMY     TOE SURGERY Right    1st toe   Social History   Social History Narrative   Lives with her son   Right Handed   Drinks 1-2 cups caffeine daily in winter   Immunization History  Administered Date(s) Administered   PNEUMOCOCCAL CONJUGATE-20 02/28/2021     Objective: Vital Signs: BP (!) 113/53   Pulse 84   Temp (!) 97.5 F (36.4 C)   Resp 15   Ht 5' 6 (1.676 m)   Wt 118 lb 6.4 oz (53.7 kg)   BMI 19.11 kg/m    Physical Exam Eyes:     Conjunctiva/sclera: Conjunctivae normal.  Cardiovascular:     Rate and Rhythm: Normal rate and regular rhythm.  Pulmonary:     Effort: Pulmonary effort is normal.     Comments: Bibasilar inspiratory crackles Lymphadenopathy:     Cervical: No cervical adenopathy.  Skin:    General: Skin is warm and dry.  Neurological:     Mental Status: She is alert.  Psychiatric:        Mood and Affect: Mood normal.      Musculoskeletal Exam:  Shoulders full ROM no tenderness or swelling Wrists full ROM no tenderness or swelling Right first MCP hyperextended with 1st CMC subluxation, Heberden's nodes and 1st CMC OA bilaterally, no palpable synovitis Mild Dupuytren's contracture worse proximal to right third digit Knees full ROM no tenderness or swelling     Investigation: No additional findings.  Imaging: CT ANGIO CHEST/ABD/PEL FOR DISSECTION W &/OR WO CONTRAST Result Date: 04/03/2024 CLINICAL DATA:  Embolism and thrombosis  of the thoracic aorta EXAM: CT ANGIOGRAPHY CHEST, ABDOMEN AND PELVIS TECHNIQUE:  Non-contrast CT of the chest was initially obtained. Multidetector CT imaging through the chest, abdomen and pelvis was performed using the standard protocol during bolus administration of intravenous contrast. Multiplanar reconstructed images and MIPs were obtained and reviewed to evaluate the vascular anatomy. RADIATION DOSE REDUCTION: This exam was performed according to the departmental dose-optimization program which includes automated exposure control, adjustment of the mA and/or kV according to patient size and/or use of iterative reconstruction technique. CONTRAST:  OMNIPAQUE  IOHEXOL  350 MG/ML SOLN COMPARISON:  CT angiogram of the chest performed February 24, 2024 FINDINGS: CTA CHEST FINDINGS Cardiovascular: Normal size heart. Left ventricular hypertrophy. Motion related changes are present in the aortic root. The tubular ascending thoracic aorta is estimated at 2.8 cm in diameter. Proximal arch vessels are patent. Diffuse atherosclerotic changes are favored in the descending thoracic aorta. There is an exophytic focus of wall adherent thrombus / plaque which is observed on image 60 of CT series 6. This is slightly larger in axial diameter measuring 9 mm on the current exam, previously 8 mm. Overall, this finding is significantly progressed when compared to September 29, 2023. Although this narrows the descending thoracic lumen, the degree of luminal narrowing is less than 50%. No evidence of pulmonary embolus. Mediastinum/Nodes: Unchanged. Lungs/Pleura: Parenchymal nodules are present which are most notable in the right lower lobe. Overall, these are similar in location and morphology but slightly different in appearance when compared to the previous exam. Mild peribronchial thickening. No pleural effusion or pneumothorax. Musculoskeletal: No chest wall abnormality. No acute or significant osseous findings. Review of the MIP  images confirms the above findings. CTA ABDOMEN AND PELVIS FINDINGS VASCULAR Aorta: Patent with minimal atherosclerotic change. Celiac: Patent. SMA: Patent. Renals: Patent. IMA: Patent. Inflow: Patent. Veins: No obvious venous abnormality within the limitations of this arterial phase study. Review of the MIP images confirms the above findings. NON-VASCULAR Hepatobiliary: Nothing significant. Pancreas: Unremarkable. No pancreatic ductal dilatation or surrounding inflammatory changes. Spleen: Normal in size without focal abnormality. Adrenals/Urinary Tract: The adrenal glands are unremarkable. There is mild dilation of the renal collecting systems and ureters bilaterally. Urinary bladder is mildly distended. Stomach/Bowel: No dilated loops of bowel are seen. The appendix is well seen and within normal limits. Lymphatic: No significant lymphadenopathy. Reproductive: Status post hysterectomy. No adnexal masses. Other: Nothing significant. Musculoskeletal: Degenerative changes in the lumbar spine, worse at L5-S1. Review of the MIP images confirms the above findings. IMPRESSION: 1. Previously observed wall adherent thrombus/plaque within the descending thoracic aorta is slightly increased in size compared to August 2025. Using axial reference imaging, this measures 9 mm, previously 8 mm. 2. No abdominal aortic aneurysm. Electronically Signed   By: Maude Naegeli M.D.   On: 04/03/2024 06:56     Recent Labs: Lab Results  Component Value Date   WBC 39.5 (H) 03/27/2024   HGB 8.8 (L) 03/27/2024   PLT 193 03/27/2024   NA 140 03/27/2024   K 5.9 (H) 03/27/2024   CL 103 03/27/2024   CO2 27 03/27/2024   GLUCOSE 40 (L) 03/27/2024   BUN 9 03/27/2024   CREATININE 1.15 (H) 03/27/2024   BILITOT 0.4 03/27/2024   ALKPHOS 112 09/29/2023   AST 25 03/27/2024   ALT 13 03/27/2024   PROT 6.7 03/27/2024   ALBUMIN 3.7 09/29/2023   CALCIUM 9.2 03/27/2024   GFRAA 88 09/28/2014    Speciality Comments: No specialty comments  available.  Procedures:  No procedures performed Allergies: Patient has no known allergies.   Assessment /  Plan:     Visit Diagnoses: Seropositive rheumatoid arthritis (HCC) - Plan: Sedimentation rate, methotrexate  (RHEUMATREX) 2.5 MG tablet Rheumatoid arthritis is well-controlled with methotrexate . No active inflammation, no recent flares reported. - Continue methotrexate  15 mg PO weekly and folic acid  1 mg daily - Check sed rate for disease monitoring  High risk medication use - methotrexate  15 mg PO weekly folic acid  1 mg daily - Plan: CBC with Differential/Platelet, Comprehensive metabolic panel with GFR Has had some recent infections requiring antibiotics. Otherwise no side effect to methotrexate  reported. - Checking CBC and CMP for medication monitoring with long-term use of methotrexate   Thrombus of descending aorta Thrombus has increased in size despite Eliquis , posing embolization risk. Vascular surgery evaluation needed. Upcoming CT scan with contrast on March 31, 2024, to reassess thrombus and vasculst surgery assessment.       Orders: Orders Placed This Encounter  Procedures   Sedimentation rate   CBC with Differential/Platelet   Comprehensive metabolic panel with GFR   Meds ordered this encounter  Medications   methotrexate  (RHEUMATREX) 2.5 MG tablet    Sig: Take 6 tablets (15 mg total) by mouth once a week. Caution:Chemotherapy. Protect from light.    Dispense:  72 tablet    Refill:  0     Follow-Up Instructions: Return in about 3 months (around 06/26/2024) for RA on MTX f/u 3mos.   Lonni LELON Ester, MD  Note - This record has been created using [employer].  Chart creation errors have been sought, but may not always  have been located. Such creation errors do not reflect on  the standard of medical care.

## 2024-03-15 ENCOUNTER — Ambulatory Visit (HOSPITAL_COMMUNITY): Admission: RE | Admit: 2024-03-15 | Source: Ambulatory Visit

## 2024-03-27 ENCOUNTER — Ambulatory Visit: Attending: Internal Medicine | Admitting: Internal Medicine

## 2024-03-27 ENCOUNTER — Encounter: Payer: Self-pay | Admitting: Internal Medicine

## 2024-03-27 VITALS — BP 113/53 | HR 84 | Temp 97.5°F | Resp 15 | Ht 66.0 in | Wt 118.4 lb

## 2024-03-27 DIAGNOSIS — Z79899 Other long term (current) drug therapy: Secondary | ICD-10-CM | POA: Diagnosis not present

## 2024-03-27 DIAGNOSIS — M059 Rheumatoid arthritis with rheumatoid factor, unspecified: Secondary | ICD-10-CM | POA: Diagnosis not present

## 2024-03-27 MED ORDER — METHOTREXATE SODIUM 2.5 MG PO TABS: 15.0000 mg | ORAL_TABLET | ORAL | 0 refills | Status: DC

## 2024-03-28 LAB — COMPREHENSIVE METABOLIC PANEL WITH GFR
AG Ratio: 1.9 (calc) (ref 1.0–2.5)
ALT: 13 U/L (ref 6–29)
AST: 25 U/L (ref 10–35)
Albumin: 4.4 g/dL (ref 3.6–5.1)
Alkaline phosphatase (APISO): 89 U/L (ref 37–153)
BUN/Creatinine Ratio: 8 (calc) (ref 6–22)
BUN: 9 mg/dL (ref 7–25)
CO2: 27 mmol/L (ref 20–32)
Calcium: 9.2 mg/dL (ref 8.6–10.4)
Chloride: 103 mmol/L (ref 98–110)
Creat: 1.15 mg/dL — ABNORMAL HIGH (ref 0.60–1.00)
Globulin: 2.3 g/dL (ref 1.9–3.7)
Glucose, Bld: 40 mg/dL — ABNORMAL LOW (ref 65–99)
Potassium: 5.9 mmol/L — ABNORMAL HIGH (ref 3.5–5.3)
Sodium: 140 mmol/L (ref 135–146)
Total Bilirubin: 0.4 mg/dL (ref 0.2–1.2)
Total Protein: 6.7 g/dL (ref 6.1–8.1)
eGFR: 49 mL/min/1.73m2 — ABNORMAL LOW (ref >=60)

## 2024-03-28 LAB — CBC WITH DIFFERENTIAL/PLATELET
Absolute Lymphocytes: 5333 {cells}/uL — ABNORMAL HIGH (ref 850–3900)
Absolute Monocytes: 11613 {cells}/uL — ABNORMAL HIGH (ref 200–950)
Basophils Absolute: 198 {cells}/uL (ref 0–200)
Basophils Relative: 0.5 %
Eosinophils Absolute: 988 {cells}/uL — ABNORMAL HIGH (ref 15–500)
Eosinophils Relative: 2.5 %
HCT: 27.3 % — ABNORMAL LOW (ref 35.0–45.0)
Hemoglobin: 8.8 g/dL — ABNORMAL LOW (ref 11.7–15.5)
MCH: 33 pg (ref 27.0–33.0)
MCHC: 32.2 g/dL (ref 32.0–36.0)
MCV: 102.2 fL — ABNORMAL HIGH (ref 80.0–100.0)
MPV: 12.9 fL — ABNORMAL HIGH (ref 7.5–12.5)
Monocytes Relative: 29.4 %
Neutro Abs: 21370 {cells}/uL — ABNORMAL HIGH (ref 1500–7800)
Neutrophils Relative %: 54.1 %
Platelets: 193 Thousand/uL (ref 140–400)
RBC: 2.67 Million/uL — ABNORMAL LOW (ref 3.80–5.10)
RDW: 17 % — ABNORMAL HIGH (ref 11.0–15.0)
Total Lymphocyte: 13.5 %
WBC: 39.5 Thousand/uL — ABNORMAL HIGH (ref 3.8–10.8)

## 2024-03-28 LAB — SEDIMENTATION RATE: Sed Rate: 46 mm/h — ABNORMAL HIGH (ref 0–30)

## 2024-03-31 ENCOUNTER — Ambulatory Visit
Admission: RE | Admit: 2024-03-31 | Discharge: 2024-03-31 | Disposition: A | Discharge status: Home / Self Care | Source: Ambulatory Visit | Attending: Vascular Surgery | Admitting: Vascular Surgery

## 2024-03-31 DIAGNOSIS — I7411 Embolism and thrombosis of thoracic aorta: Secondary | ICD-10-CM | POA: Diagnosis not present

## 2024-03-31 MED ORDER — IOHEXOL 350 MG/ML SOLN
100.0000 mL | Freq: Once | INTRAVENOUS | Status: AC | PRN
  Administered 2024-03-31: 100 mL via INTRAVENOUS

## 2024-04-03 NOTE — Progress Notes (Unsigned)
 [facility] [place]  ASSESSMENT / PLAN: 78 y.o. 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 This has grown over 6 months despite compliance with anticoagulation therapy.  The lesion has high embolic risk.  I recommend intervention with TEVAR.  Will arrange to do this as soon as possible.  Counseled patient to present to the ER for any concerning symptoms in the abdomen or lower extremities.    CHIEF COMPLAINT: Abnormal CT finding  HISTORY OF PRESENT ILLNESS: Laurie Wallace is a 78 y.o. female referred to clinic for evaluation of an abnormal CT angiogram finding.  This was performed to evaluate upper abdominal and back pain in the emergency department about a month ago.  Received a call from the emergency department physician and together we made a plan for surveillance including a follow-up CT scan and initiation of anticoagulation therapy.  The patient had actually been started on anticoagulation prior in March for subsegmental PE and reports compliance with Eliquis  since that time.  The patient is otherwise fairly healthy and does not have any typical risk factors for atherosclerotic disease.  Past Medical History:  Diagnosis Date   Allergy    Arthritis    COPD (chronic obstructive pulmonary disease) (HCC)     Past Surgical History:  Procedure Laterality Date   ABDOMINAL HYSTERECTOMY     TOE SURGERY Right    1st toe    Family History  Problem Relation Age of Onset   Cancer Mother    Cancer Father    Heart failure Sister    COPD Sister    Dementia Sister     Social History   Socioeconomic History   Marital status: Single    Spouse name: Not on file   Number of children: Not on file   Years of education: Not on file   Highest education level: Not on file  Occupational History   Occupation: full time  Tobacco Use   Smoking status: Never    Passive exposure: Never   Smokeless tobacco: Never  Vaping Use    Vaping status: Never Used  Substance and Sexual Activity   Alcohol use: No   Drug use: No   Sexual activity: Not Currently  Other Topics Concern   Not on file  Social History Narrative   Lives with her son   Right Handed   Drinks 1-2 cups caffeine daily in winter   Social Drivers of Health   Financial Resource Strain: Low Risk  (06/14/2023)   Overall Financial Resource Strain (CARDIA)    Difficulty of Paying Living Expenses: Not hard at all  Food Insecurity: No Food Insecurity (09/29/2023)   Hunger Vital Sign    Worried About Running Out of Food in the Last Year: Never true    Ran Out of Food in the Last Year: Never true  Transportation Needs: No Transportation Needs (09/29/2023)   PRAPARE - Administrator, Civil Service (Medical): No    Lack of Transportation (Non-Medical): No  Physical Activity: Sufficiently Active (06/14/2023)   Exercise Vital Sign    Days of Exercise per Week: 5 days    Minutes of Exercise per Session: 30 min  Stress: No Stress Concern Present (06/14/2023)   [employer] of Occupational Health - Occupational Stress Questionnaire    Feeling of Stress : Not at all  Social Connections: Moderately Isolated (09/29/2023)   Social Connection and Isolation Panel    Frequency of Communication with Friends and Family:  More than three times a week    Frequency of Social Gatherings with Friends and Family: More than three times a week    Attends Religious Services: More than 4 times per year    Active Member of Clubs or Organizations: No    Attends Banker Meetings: Never    Marital Status: Widowed  Intimate Partner Violence: Not At Risk (09/29/2023)   Humiliation, Afraid, Rape, and Kick questionnaire    Fear of Current or Ex-Partner: No    Emotionally Abused: No    Physically Abused: No    Sexually Abused: No    No Known Allergies  Current Outpatient Medications  Medication Sig Dispense Refill   alendronate  (FOSAMAX ) 70 MG tablet  Take 1 tablet (70 mg total) by mouth every 7 (seven) days. Take with a full glass of water on an empty stomach. 4 tablet 11   ELIQUIS  5 MG TABS tablet Take 1 tablet (5 mg total) by mouth 2 (two) times daily. 60 tablet 3   famotidine  (PEPCID ) 40 MG tablet Take 1 tablet (40 mg total) by mouth daily. 90 tablet 1   fluticasone  (FLONASE ) 50 MCG/ACT nasal spray Place 1 spray into both nostrils daily. (Patient not taking: Reported on 03/27/2024) 11.1 mL 2   folic acid  (FOLVITE ) 1 MG tablet Take 1 tablet (1 mg total) by mouth daily. 90 tablet 3   Iron , Ferrous Sulfate , 325 (65 Fe) MG TABS Take 325 mg by mouth daily. 30 tablet 5   levocetirizine (XYZAL ) 5 MG tablet Take 1 tablet (5 mg total) by mouth every evening. (Patient not taking: Reported on 03/27/2024) 30 tablet 5   methotrexate  (RHEUMATREX) 2.5 MG tablet Take 6 tablets (15 mg total) by mouth once a week. Caution:Chemotherapy. Protect from light. 72 tablet 0   No current facility-administered medications for this visit.    PHYSICAL EXAM Vitals:   04/04/24 1042  BP: 123/70  Pulse: 83  Temp: 97.9 F (36.6 C)  SpO2: 94%  Weight: 120 lb (54.4 kg)  Height: 5' 6 (1.676 m)   No distress Regular rate and rhythm Unlabored breathing   PERTINENT LABORATORY AND RADIOLOGIC DATA  Most recent CBC    Latest Ref Rng & Units 03/27/2024   12:05 PM 02/24/2024    1:20 PM 12/23/2023    1:18 PM  CBC  WBC 3.8 - 10.8 Thousand/uL 39.5  22.7  9.8   Hemoglobin 11.7 - 15.5 g/dL 8.8  9.8  89.3   Hematocrit 35.0 - 45.0 % 27.3  29.9  32.7   Platelets 140 - 400 Thousand/uL 193  380  139      Most recent CMP    Latest Ref Rng & Units 03/27/2024   12:05 PM 02/24/2024    1:20 PM 12/23/2023    1:18 PM  CMP  Glucose 65 - 99 mg/dL 40  95  91   BUN 7 - 25 mg/dL 9  12  13    Creatinine 0.60 - 1.00 mg/dL 8.84  8.99  8.97   Sodium 135 - 146 mmol/L 140  142  140   Potassium 3.5 - 5.3 mmol/L 5.9  4.0  5.1   Chloride 98 - 110 mmol/L 103  106  104   CO2 20 - 32  mmol/L 27  23  27    Calcium 8.6 - 10.4 mg/dL 9.2  9.0  9.2   Total Protein 6.1 - 8.1 g/dL 6.7   6.7   Total Bilirubin 0.2 - 1.2 mg/dL 0.4  0.4   AST 10 - 35 U/L 25   91   ALT 6 - 29 U/L 13   120     Renal function Estimated Creatinine Clearance: 34.2 mL/min (A) (by C-G formula based on SCr of 1.15 mg/dL (H)).  Hgb A1c MFr Bld (%)  Date Value  01/10/2023 5.8 (H)   CT angiogram from September, August, and March 2025 personally reviewed in detail.  Pedunculated thrombus has grown during this timeframe and encroaches fairly significantly into the lumen of the aorta.  The thrombus has a discrete stenosis which, in my estimation, represents a high risk for embolization.  Debby SAILOR. Magda, MD FACS [facility] Phone Number: [phone] 04/03/2024 10:10 AM   Total time spent on preparing this encounter including chart review, data review, collecting history, examining the patient, and coordinating care: 60 minutes  Portions of this report may have been transcribed using voice recognition software.  Every effort has been made to ensure accuracy; however, inadvertent computerized transcription errors may still be present.

## 2024-04-03 NOTE — H&P (View-Only) (Signed)
 [facility] OF [place]  ASSESSMENT / PLAN: 78 y.o. 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 This has grown over 6 months despite compliance with anticoagulation therapy.  The lesion has high embolic risk.  I recommend intervention with TEVAR.  Will arrange to do this as soon as possible.  Counseled patient to present to the ER for any concerning symptoms in the abdomen or lower extremities.    CHIEF COMPLAINT: Abnormal CT finding  HISTORY OF PRESENT ILLNESS: Laurie Wallace is a 78 y.o. female referred to clinic for evaluation of an abnormal CT angiogram finding.  This was performed to evaluate upper abdominal and back pain in the emergency department about a month ago.  Received a call from the emergency department physician and together we made a plan for surveillance including a follow-up CT scan and initiation of anticoagulation therapy.  The patient had actually been started on anticoagulation prior in March for subsegmental PE and reports compliance with Eliquis  since that time.  The patient is otherwise fairly healthy and does not have any typical risk factors for atherosclerotic disease.  Past Medical History:  Diagnosis Date   Allergy    Arthritis    COPD (chronic obstructive pulmonary disease) (HCC)     Past Surgical History:  Procedure Laterality Date   ABDOMINAL HYSTERECTOMY     TOE SURGERY Right    1st toe    Family History  Problem Relation Age of Onset   Cancer Mother    Cancer Father    Heart failure Sister    COPD Sister    Dementia Sister     Social History   Socioeconomic History   Marital status: Single    Spouse name: Not on file   Number of children: Not on file   Years of education: Not on file   Highest education level: Not on file  Occupational History   Occupation: full time  Tobacco Use   Smoking status: Never    Passive exposure: Never   Smokeless tobacco: Never  Vaping Use    Vaping status: Never Used  Substance and Sexual Activity   Alcohol use: No   Drug use: No   Sexual activity: Not Currently  Other Topics Concern   Not on file  Social History Narrative   Lives with her son   Right Handed   Drinks 1-2 cups caffeine daily in winter   Social Drivers of Health   Financial Resource Strain: Low Risk  (06/14/2023)   Overall Financial Resource Strain (CARDIA)    Difficulty of Paying Living Expenses: Not hard at all  Food Insecurity: No Food Insecurity (09/29/2023)   Hunger Vital Sign    Worried About Running Out of Food in the Last Year: Never true    Ran Out of Food in the Last Year: Never true  Transportation Needs: No Transportation Needs (09/29/2023)   PRAPARE - Administrator, Civil Service (Medical): No    Lack of Transportation (Non-Medical): No  Physical Activity: Sufficiently Active (06/14/2023)   Exercise Vital Sign    Days of Exercise per Week: 5 days    Minutes of Exercise per Session: 30 min  Stress: No Stress Concern Present (06/14/2023)   [employer] of Occupational Health - Occupational Stress Questionnaire    Feeling of Stress : Not at all  Social Connections: Moderately Isolated (09/29/2023)   Social Connection and Isolation Panel    Frequency of Communication with Friends and Family:  More than three times a week    Frequency of Social Gatherings with Friends and Family: More than three times a week    Attends Religious Services: More than 4 times per year    Active Member of Clubs or Organizations: No    Attends Banker Meetings: Never    Marital Status: Widowed  Intimate Partner Violence: Not At Risk (09/29/2023)   Humiliation, Afraid, Rape, and Kick questionnaire    Fear of Current or Ex-Partner: No    Emotionally Abused: No    Physically Abused: No    Sexually Abused: No    No Known Allergies  Current Outpatient Medications  Medication Sig Dispense Refill   alendronate  (FOSAMAX ) 70 MG tablet  Take 1 tablet (70 mg total) by mouth every 7 (seven) days. Take with a full glass of water on an empty stomach. 4 tablet 11   ELIQUIS  5 MG TABS tablet Take 1 tablet (5 mg total) by mouth 2 (two) times daily. 60 tablet 3   famotidine  (PEPCID ) 40 MG tablet Take 1 tablet (40 mg total) by mouth daily. 90 tablet 1   fluticasone  (FLONASE ) 50 MCG/ACT nasal spray Place 1 spray into both nostrils daily. (Patient not taking: Reported on 03/27/2024) 11.1 mL 2   folic acid  (FOLVITE ) 1 MG tablet Take 1 tablet (1 mg total) by mouth daily. 90 tablet 3   Iron , Ferrous Sulfate , 325 (65 Fe) MG TABS Take 325 mg by mouth daily. 30 tablet 5   levocetirizine (XYZAL ) 5 MG tablet Take 1 tablet (5 mg total) by mouth every evening. (Patient not taking: Reported on 03/27/2024) 30 tablet 5   methotrexate  (RHEUMATREX) 2.5 MG tablet Take 6 tablets (15 mg total) by mouth once a week. Caution:Chemotherapy. Protect from light. 72 tablet 0   No current facility-administered medications for this visit.    PHYSICAL EXAM Vitals:   04/04/24 1042  BP: 123/70  Pulse: 83  Temp: 97.9 F (36.6 C)  SpO2: 94%  Weight: 120 lb (54.4 kg)  Height: 5' 6 (1.676 m)   No distress Regular rate and rhythm Unlabored breathing   PERTINENT LABORATORY AND RADIOLOGIC DATA  Most recent CBC    Latest Ref Rng & Units 03/27/2024   12:05 PM 02/24/2024    1:20 PM 12/23/2023    1:18 PM  CBC  WBC 3.8 - 10.8 Thousand/uL 39.5  22.7  9.8   Hemoglobin 11.7 - 15.5 g/dL 8.8  9.8  89.3   Hematocrit 35.0 - 45.0 % 27.3  29.9  32.7   Platelets 140 - 400 Thousand/uL 193  380  139      Most recent CMP    Latest Ref Rng & Units 03/27/2024   12:05 PM 02/24/2024    1:20 PM 12/23/2023    1:18 PM  CMP  Glucose 65 - 99 mg/dL 40  95  91   BUN 7 - 25 mg/dL 9  12  13    Creatinine 0.60 - 1.00 mg/dL 8.84  8.99  8.97   Sodium 135 - 146 mmol/L 140  142  140   Potassium 3.5 - 5.3 mmol/L 5.9  4.0  5.1   Chloride 98 - 110 mmol/L 103  106  104   CO2 20 - 32  mmol/L 27  23  27    Calcium 8.6 - 10.4 mg/dL 9.2  9.0  9.2   Total Protein 6.1 - 8.1 g/dL 6.7   6.7   Total Bilirubin 0.2 - 1.2 mg/dL 0.4  0.4   AST 10 - 35 U/L 25   91   ALT 6 - 29 U/L 13   120     Renal function Estimated Creatinine Clearance: 34.2 mL/min (A) (by C-G formula based on SCr of 1.15 mg/dL (H)).  Hgb A1c MFr Bld (%)  Date Value  01/10/2023 5.8 (H)   CT angiogram from September, August, and March 2025 personally reviewed in detail.  Pedunculated thrombus has grown during this timeframe and encroaches fairly significantly into the lumen of the aorta.  The thrombus has a discrete stenosis which, in my estimation, represents a high risk for embolization.  Debby SAILOR. Magda, MD FACS [facility] Phone Number: [phone] 04/03/2024 10:10 AM   Total time spent on preparing this encounter including chart review, data review, collecting history, examining the patient, and coordinating care: 60 minutes  Portions of this report may have been transcribed using voice recognition software.  Every effort has been made to ensure accuracy; however, inadvertent computerized transcription errors may still be present.

## 2024-04-04 ENCOUNTER — Encounter: Payer: Self-pay | Admitting: Vascular Surgery

## 2024-04-04 ENCOUNTER — Ambulatory Visit: Attending: Vascular Surgery | Admitting: Vascular Surgery

## 2024-04-04 ENCOUNTER — Other Ambulatory Visit: Payer: Self-pay

## 2024-04-04 VITALS — BP 123/70 | HR 83 | Temp 97.9°F | Ht 66.0 in | Wt 120.0 lb

## 2024-04-04 DIAGNOSIS — I7410 Embolism and thrombosis of unspecified parts of aorta: Secondary | ICD-10-CM

## 2024-04-04 DIAGNOSIS — I7411 Embolism and thrombosis of thoracic aorta: Secondary | ICD-10-CM

## 2024-04-05 ENCOUNTER — Inpatient Hospital Stay

## 2024-04-05 DIAGNOSIS — Z79899 Other long term (current) drug therapy: Secondary | ICD-10-CM | POA: Diagnosis not present

## 2024-04-05 DIAGNOSIS — D75839 Thrombocytosis, unspecified: Secondary | ICD-10-CM | POA: Insufficient documentation

## 2024-04-05 DIAGNOSIS — I2699 Other pulmonary embolism without acute cor pulmonale: Secondary | ICD-10-CM | POA: Insufficient documentation

## 2024-04-05 LAB — IRON AND TIBC
Iron: 133 ug/dL (ref 28–170)
Saturation Ratios: 55 % — ABNORMAL HIGH (ref 10.4–31.8)
TIBC: 242 ug/dL — ABNORMAL LOW (ref 250–450)
UIBC: 109 ug/dL

## 2024-04-05 LAB — FOLATE: Folate: >20 ng/mL (ref >5.9)

## 2024-04-05 LAB — FERRITIN: Ferritin: 362 ng/mL — ABNORMAL HIGH (ref 11–307)

## 2024-04-05 LAB — VITAMIN B12: Vitamin B-12: 1742 pg/mL — ABNORMAL HIGH (ref 180–914)

## 2024-04-05 LAB — D-DIMER, QUANTITATIVE: D-Dimer, Quant: 0.62 ug{FEU}/mL — ABNORMAL HIGH (ref 0.00–0.50)

## 2024-04-09 ENCOUNTER — Other Ambulatory Visit: Payer: Self-pay | Admitting: Internal Medicine

## 2024-04-09 DIAGNOSIS — K219 Gastro-esophageal reflux disease without esophagitis: Secondary | ICD-10-CM

## 2024-04-10 ENCOUNTER — Encounter (HOSPITAL_COMMUNITY): Payer: Self-pay

## 2024-04-10 NOTE — Pre-Procedure Instructions (Signed)
 Surgical Instructions   Your procedure is scheduled on April 12, 2024. Report to [facility] Main Entrance A at 6:30 A.M., then check in with the Admitting office. Any questions or running late day of surgery: call [phone]  Questions prior to your surgery date: call [phone], Monday-Friday, 8am-4pm. If you experience any cold or flu symptoms such as cough, fever, chills, shortness of breath, etc. between now and your scheduled surgery, please notify us  at the above number.     Remember:  Do not eat or drinking after midnight the night before your surgery    Take these medicines the morning of surgery with A SIP OF WATER: famotidine  (PEPCID )    STOP taking your ELIQUIS  three days prior to surgery. Your last dose will be October 4th.   Please follow your prescribing physicians instructions on if/when to stop taking methotrexate  (RHEUMATREX).   One week prior to surgery, STOP taking any Aspirin (unless otherwise instructed by your surgeon) Aleve , Naproxen , Ibuprofen , Motrin , Advil , Goody's, BC's, all herbal medications, fish oil, and non-prescription vitamins.                     Do NOT Smoke (Tobacco/Vaping) for 24 hours prior to your procedure.  If you use a CPAP at night, you may bring your mask/headgear for your overnight stay.   You will be asked to remove any contacts, glasses, piercing's, hearing aid's, dentures/partials prior to surgery. Please bring cases for these items if needed.    Patients discharged the day of surgery will not be allowed to drive home, and someone needs to stay with them for 24 hours.  SURGICAL WAITING ROOM VISITATION Patients may have no more than 2 support people in the waiting area - these visitors may rotate.   Pre-op nurse will coordinate an appropriate time for 1 ADULT support person, who may not rotate, to accompany patient in pre-op.  Children under the age of 62 must have an adult with them who is not the patient and must remain  in the main waiting area with an adult.  If the patient needs to stay at the hospital during part of their recovery, the visitor guidelines for inpatient rooms apply.  Please refer to the [facility] website for the visitor guidelines for any additional information.   If you received a COVID test during your pre-op visit  it is requested that you wear a mask when out in public, stay away from anyone that may not be feeling well and notify your surgeon if you develop symptoms. If you have been in contact with anyone that has tested positive in the last 10 days please notify you surgeon.      Pre-operative CHG Bathing Instructions   You can play a key role in reducing the risk of infection after surgery. Your skin needs to be as free of germs as possible. You can reduce the number of germs on your skin by washing with CHG (chlorhexidine gluconate) soap before surgery. CHG is an antiseptic soap that kills germs and continues to kill germs even after washing.   DO NOT use if you have an allergy to chlorhexidine/CHG or antibacterial soaps. If your skin becomes reddened or irritated, stop using the CHG and notify one of our RNs at [phone].              TAKE A SHOWER THE NIGHT BEFORE SURGERY   Please keep in mind the following:  DO NOT shave, including legs and underarms,  48 hours prior to surgery.   You may shave your face before/day of surgery.  Place clean sheets on your bed the night before surgery Use a clean washcloth (not used since being washed) for shower. DO NOT sleep with pet's night before surgery.  CHG Shower Instructions:  Wash your face and private area with normal soap. If you choose to wash your hair, wash first with your normal shampoo.  After you use shampoo/soap, rinse your hair and body thoroughly to remove shampoo/soap residue.  Turn the water OFF and apply half the bottle of CHG soap to a CLEAN washcloth.  Apply CHG soap ONLY FROM YOUR NECK DOWN TO YOUR TOES  (washing for 3-5 minutes)  DO NOT use CHG soap on face, private areas, open wounds, or sores.  Pay special attention to the area where your surgery is being performed.  If you are having back surgery, having someone wash your back for you may be helpful. Wait 2 minutes after CHG soap is applied, then you may rinse off the CHG soap.  Pat dry with a clean towel  Put on clean pajamas    Additional instructions for the day of surgery: If you choose, you may shower the morning of surgery with an antibacterial soap.  DO NOT APPLY any lotions, deodorants, cologne, or perfumes.   Do not wear jewelry or makeup Do not wear nail polish, gel polish, artificial nails, or any other type of covering on natural nails (fingers and toes) Do not bring valuables to the hospital. [facility] is not responsible for valuables/personal belongings. Put on clean/comfortable clothes.  Please brush your teeth.  Ask your nurse before applying any prescription medications to the skin.

## 2024-04-11 ENCOUNTER — Telehealth: Admitting: Oncology

## 2024-04-11 ENCOUNTER — Inpatient Hospital Stay (HOSPITAL_COMMUNITY)
Admission: RE | Admit: 2024-04-11 | Discharge: 2024-04-11 | Disposition: A | Source: Ambulatory Visit | Attending: Vascular Surgery

## 2024-04-11 ENCOUNTER — Other Ambulatory Visit: Payer: Self-pay

## 2024-04-11 ENCOUNTER — Encounter (HOSPITAL_COMMUNITY): Payer: Self-pay

## 2024-04-11 VITALS — BP 121/98 | HR 85 | Temp 98.2°F | Resp 16 | Ht 66.0 in | Wt 120.4 lb

## 2024-04-11 DIAGNOSIS — Z7983 Long term (current) use of bisphosphonates: Secondary | ICD-10-CM | POA: Diagnosis not present

## 2024-04-11 DIAGNOSIS — Z7901 Long term (current) use of anticoagulants: Secondary | ICD-10-CM | POA: Diagnosis not present

## 2024-04-11 DIAGNOSIS — Z01818 Encounter for other preprocedural examination: Secondary | ICD-10-CM

## 2024-04-11 DIAGNOSIS — Z01812 Encounter for preprocedural laboratory examination: Secondary | ICD-10-CM | POA: Insufficient documentation

## 2024-04-11 DIAGNOSIS — Z86711 Personal history of pulmonary embolism: Secondary | ICD-10-CM | POA: Diagnosis not present

## 2024-04-11 DIAGNOSIS — D509 Iron deficiency anemia, unspecified: Secondary | ICD-10-CM | POA: Diagnosis not present

## 2024-04-11 DIAGNOSIS — M069 Rheumatoid arthritis, unspecified: Secondary | ICD-10-CM | POA: Diagnosis not present

## 2024-04-11 DIAGNOSIS — I7411 Embolism and thrombosis of thoracic aorta: Secondary | ICD-10-CM | POA: Insufficient documentation

## 2024-04-11 DIAGNOSIS — J449 Chronic obstructive pulmonary disease, unspecified: Secondary | ICD-10-CM | POA: Diagnosis not present

## 2024-04-11 DIAGNOSIS — Z79899 Other long term (current) drug therapy: Secondary | ICD-10-CM | POA: Diagnosis not present

## 2024-04-11 DIAGNOSIS — Z825 Family history of asthma and other chronic lower respiratory diseases: Secondary | ICD-10-CM | POA: Diagnosis not present

## 2024-04-11 DIAGNOSIS — Z79631 Long term (current) use of antimetabolite agent: Secondary | ICD-10-CM | POA: Diagnosis not present

## 2024-04-11 DIAGNOSIS — I739 Peripheral vascular disease, unspecified: Secondary | ICD-10-CM | POA: Diagnosis not present

## 2024-04-11 DIAGNOSIS — C9310 Chronic myelomonocytic leukemia not having achieved remission: Secondary | ICD-10-CM | POA: Diagnosis not present

## 2024-04-11 DIAGNOSIS — K219 Gastro-esophageal reflux disease without esophagitis: Secondary | ICD-10-CM | POA: Diagnosis not present

## 2024-04-11 DIAGNOSIS — T451X5A Adverse effect of antineoplastic and immunosuppressive drugs, initial encounter: Secondary | ICD-10-CM | POA: Diagnosis not present

## 2024-04-11 DIAGNOSIS — D75839 Thrombocytosis, unspecified: Secondary | ICD-10-CM | POA: Diagnosis not present

## 2024-04-11 DIAGNOSIS — D6481 Anemia due to antineoplastic chemotherapy: Secondary | ICD-10-CM | POA: Diagnosis not present

## 2024-04-11 DIAGNOSIS — D721 Eosinophilia, unspecified: Secondary | ICD-10-CM | POA: Diagnosis not present

## 2024-04-11 DIAGNOSIS — Z8249 Family history of ischemic heart disease and other diseases of the circulatory system: Secondary | ICD-10-CM | POA: Diagnosis not present

## 2024-04-11 HISTORY — DX: Family history of other specified conditions: Z84.89

## 2024-04-11 HISTORY — DX: Pneumonia, unspecified organism: J18.9

## 2024-04-11 HISTORY — DX: Anemia, unspecified: D64.9

## 2024-04-11 HISTORY — DX: Peripheral vascular disease, unspecified: I73.9

## 2024-04-11 HISTORY — DX: Gastro-esophageal reflux disease without esophagitis: K21.9

## 2024-04-11 LAB — URINALYSIS, ROUTINE W REFLEX MICROSCOPIC
Bilirubin Urine: NEGATIVE
Glucose, UA: NEGATIVE mg/dL
Hgb urine dipstick: NEGATIVE
Ketones, ur: NEGATIVE mg/dL
Leukocytes,Ua: NEGATIVE
Nitrite: NEGATIVE
Protein, ur: NEGATIVE mg/dL
Specific Gravity, Urine: 1.003 — ABNORMAL LOW (ref 1.005–1.030)
pH: 7 (ref 5.0–8.0)

## 2024-04-11 LAB — COMPREHENSIVE METABOLIC PANEL WITH GFR
ALT: 25 U/L (ref 0–44)
AST: 31 U/L (ref 15–41)
Albumin: 4 g/dL (ref 3.5–5.0)
Alkaline Phosphatase: 90 U/L (ref 38–126)
Anion gap: 8 (ref 5–15)
BUN: 6 mg/dL — ABNORMAL LOW (ref 8–23)
CO2: 26 mmol/L (ref 22–32)
Calcium: 9.1 mg/dL (ref 8.9–10.3)
Chloride: 106 mmol/L (ref 98–111)
Creatinine, Ser: 1.17 mg/dL — ABNORMAL HIGH (ref 0.44–1.00)
GFR, Estimated: 48 mL/min — ABNORMAL LOW (ref >60)
Glucose, Bld: 94 mg/dL (ref 70–99)
Potassium: 4.5 mmol/L (ref 3.5–5.1)
Sodium: 140 mmol/L (ref 135–145)
Total Bilirubin: 0.6 mg/dL (ref 0.0–1.2)
Total Protein: 6.8 g/dL (ref 6.5–8.1)

## 2024-04-11 LAB — PROTIME-INR
INR: 1.3 — ABNORMAL HIGH (ref 0.8–1.2)
Prothrombin Time: 17.2 s — ABNORMAL HIGH (ref 11.4–15.2)

## 2024-04-11 LAB — CBC
HCT: 26.5 % — ABNORMAL LOW (ref 36.0–46.0)
Hemoglobin: 8.3 g/dL — ABNORMAL LOW (ref 12.0–15.0)
MCH: 32.9 pg (ref 26.0–34.0)
MCHC: 31.3 g/dL (ref 30.0–36.0)
MCV: 105.2 fL — ABNORMAL HIGH (ref 80.0–100.0)
Platelets: 530 K/uL — ABNORMAL HIGH (ref 150–400)
RBC: 2.52 MIL/uL — ABNORMAL LOW (ref 3.87–5.11)
RDW: 19.9 % — ABNORMAL HIGH (ref 11.5–15.5)
WBC: 35 K/uL — ABNORMAL HIGH (ref 4.0–10.5)
nRBC: 0.3 % — ABNORMAL HIGH (ref 0.0–0.2)

## 2024-04-11 LAB — CBC WITH DIFFERENTIAL/PLATELET
Abs Immature Granulocytes: 7.3 K/uL — ABNORMAL HIGH (ref 0.00–0.07)
Basophils Absolute: 0.3 K/uL — ABNORMAL HIGH (ref 0.0–0.1)
Basophils Relative: 1 %
Eosinophils Absolute: 2.8 K/uL — ABNORMAL HIGH (ref 0.0–0.5)
Eosinophils Relative: 8 %
HCT: 26.3 % — ABNORMAL LOW (ref 36.0–46.0)
Hemoglobin: 8.4 g/dL — ABNORMAL LOW (ref 12.0–15.0)
Lymphocytes Relative: 24 %
Lymphs Abs: 8.4 K/uL — ABNORMAL HIGH (ref 0.7–4.0)
MCH: 32.4 pg (ref 26.0–34.0)
MCHC: 31.9 g/dL (ref 30.0–36.0)
MCV: 101.5 fL — ABNORMAL HIGH (ref 80.0–100.0)
Metamyelocytes Relative: 5 %
Monocytes Absolute: 8.4 K/uL — ABNORMAL HIGH (ref 0.1–1.0)
Monocytes Relative: 24 %
Myelocytes: 16 %
Neutro Abs: 7.7 K/uL (ref 1.7–7.7)
Neutrophils Relative %: 22 %
Platelets: 364 K/uL (ref 150–400)
RBC: 2.59 MIL/uL — ABNORMAL LOW (ref 3.87–5.11)
RDW: 19.9 % — ABNORMAL HIGH (ref 11.5–15.5)
WBC: 34.9 K/uL — ABNORMAL HIGH (ref 4.0–10.5)
nRBC: 0.3 % — ABNORMAL HIGH (ref 0.0–0.2)

## 2024-04-11 LAB — PATHOLOGIST SMEAR REVIEW

## 2024-04-11 LAB — SURGICAL PCR SCREEN
MRSA, PCR: NEGATIVE
Staphylococcus aureus: NEGATIVE

## 2024-04-11 LAB — APTT: aPTT: 34 s (ref 24–36)

## 2024-04-11 NOTE — Progress Notes (Signed)
 Alan Glance, OR scheduler with Dr. Magda made aware of abnormal CBC results as well as additional order of CBC with differential per lab.

## 2024-04-11 NOTE — Progress Notes (Signed)
 PCP - Dr. Suzzane Blanch Cardiologist - Denies Rheumatologist - Dr. Lonni Pizza  PPM/ICD - Denies Device Orders - n/a Rep Notified - n/a  Chest x-ray - 02/24/2024 EKG - 02/24/2024 Stress Test - Denies ECHO - Denies Cardiac Cath - Denies  Sleep Study - Denies CPAP - n/a  No DM  Last dose of GLP1 agonist- n/a GLP1 instructions: n/a  Blood Thinner Instructions: Pt instructed to stop taking Eliquis  three days prior to surgery. Last dose was October 4th. Aspirin Instructions: Pt instructed to continue taking ASA through the DOS  NPO after midnight  COVID TEST- n/a   Anesthesia review: No. Pt notes she took her weekly dose of Methotrexate  today, October 7th.   Patient denies shortness of breath, fever, cough and chest pain at PAT appointment. Pt denies any respiratory illness/infection in the last two months.    All instructions explained to the patient, with a verbal understanding of the material. Patient agrees to go over the instructions while at home for a better understanding. Patient also instructed to self quarantine after being tested for COVID-19. The opportunity to ask questions was provided.

## 2024-04-12 ENCOUNTER — Inpatient Hospital Stay (HOSPITAL_COMMUNITY)

## 2024-04-12 ENCOUNTER — Inpatient Hospital Stay (HOSPITAL_COMMUNITY)
Admission: RE | Admit: 2024-04-12 | Discharge: 2024-04-14 | DRG: 220 | Disposition: A | Discharge status: Home / Self Care | Attending: Vascular Surgery | Admitting: Vascular Surgery

## 2024-04-12 ENCOUNTER — Encounter (HOSPITAL_COMMUNITY): Payer: Self-pay | Admitting: Vascular Surgery

## 2024-04-12 ENCOUNTER — Ambulatory Visit: Payer: Self-pay | Admitting: Internal Medicine

## 2024-04-12 ENCOUNTER — Inpatient Hospital Stay: Admitting: Oncology

## 2024-04-12 ENCOUNTER — Other Ambulatory Visit: Payer: Self-pay

## 2024-04-12 ENCOUNTER — Encounter (HOSPITAL_COMMUNITY): Admission: RE | Disposition: A | Payer: Self-pay | Source: Home / Self Care | Attending: Vascular Surgery

## 2024-04-12 DIAGNOSIS — D72821 Monocytosis (symptomatic): Secondary | ICD-10-CM | POA: Diagnosis not present

## 2024-04-12 DIAGNOSIS — I739 Peripheral vascular disease, unspecified: Secondary | ICD-10-CM | POA: Diagnosis present

## 2024-04-12 DIAGNOSIS — Z86711 Personal history of pulmonary embolism: Secondary | ICD-10-CM | POA: Diagnosis not present

## 2024-04-12 DIAGNOSIS — Z8616 Personal history of COVID-19: Secondary | ICD-10-CM

## 2024-04-12 DIAGNOSIS — C9310 Chronic myelomonocytic leukemia not having achieved remission: Secondary | ICD-10-CM | POA: Diagnosis present

## 2024-04-12 DIAGNOSIS — I7410 Embolism and thrombosis of unspecified parts of aorta: Secondary | ICD-10-CM | POA: Diagnosis not present

## 2024-04-12 DIAGNOSIS — Z8249 Family history of ischemic heart disease and other diseases of the circulatory system: Secondary | ICD-10-CM | POA: Diagnosis not present

## 2024-04-12 DIAGNOSIS — D72829 Elevated white blood cell count, unspecified: Secondary | ICD-10-CM

## 2024-04-12 DIAGNOSIS — D75839 Thrombocytosis, unspecified: Secondary | ICD-10-CM | POA: Diagnosis present

## 2024-04-12 DIAGNOSIS — D509 Iron deficiency anemia, unspecified: Secondary | ICD-10-CM | POA: Diagnosis present

## 2024-04-12 DIAGNOSIS — Z7901 Long term (current) use of anticoagulants: Secondary | ICD-10-CM

## 2024-04-12 DIAGNOSIS — T451X5A Adverse effect of antineoplastic and immunosuppressive drugs, initial encounter: Secondary | ICD-10-CM | POA: Diagnosis present

## 2024-04-12 DIAGNOSIS — D6481 Anemia due to antineoplastic chemotherapy: Secondary | ICD-10-CM | POA: Diagnosis present

## 2024-04-12 DIAGNOSIS — Z79631 Long term (current) use of antimetabolite agent: Secondary | ICD-10-CM

## 2024-04-12 DIAGNOSIS — I7411 Embolism and thrombosis of thoracic aorta: Secondary | ICD-10-CM | POA: Diagnosis not present

## 2024-04-12 DIAGNOSIS — Z90710 Acquired absence of both cervix and uterus: Secondary | ICD-10-CM

## 2024-04-12 DIAGNOSIS — D471 Chronic myeloproliferative disease: Secondary | ICD-10-CM

## 2024-04-12 DIAGNOSIS — M069 Rheumatoid arthritis, unspecified: Secondary | ICD-10-CM | POA: Diagnosis present

## 2024-04-12 DIAGNOSIS — K219 Gastro-esophageal reflux disease without esophagitis: Secondary | ICD-10-CM | POA: Diagnosis present

## 2024-04-12 DIAGNOSIS — J449 Chronic obstructive pulmonary disease, unspecified: Secondary | ICD-10-CM | POA: Diagnosis present

## 2024-04-12 DIAGNOSIS — Z95828 Presence of other vascular implants and grafts: Principal | ICD-10-CM

## 2024-04-12 DIAGNOSIS — D721 Eosinophilia, unspecified: Secondary | ICD-10-CM | POA: Diagnosis present

## 2024-04-12 DIAGNOSIS — Z825 Family history of asthma and other chronic lower respiratory diseases: Secondary | ICD-10-CM

## 2024-04-12 DIAGNOSIS — Z7983 Long term (current) use of bisphosphonates: Secondary | ICD-10-CM

## 2024-04-12 DIAGNOSIS — Z79899 Other long term (current) drug therapy: Secondary | ICD-10-CM

## 2024-04-12 HISTORY — PX: ULTRASOUND GUIDANCE FOR VASCULAR ACCESS: SHX6516

## 2024-04-12 HISTORY — PX: THORACIC AORTIC ENDOVASCULAR STENT GRAFT: SHX6112

## 2024-04-12 LAB — POCT ACTIVATED CLOTTING TIME
Activated Clotting Time: 199 s
Activated Clotting Time: 268 s

## 2024-04-12 LAB — ABO/RH: ABO/RH(D): O POS

## 2024-04-12 LAB — CBC
HCT: 23.1 % — ABNORMAL LOW (ref 36.0–46.0)
Hemoglobin: 7.6 g/dL — ABNORMAL LOW (ref 12.0–15.0)
MCH: 34.4 pg — ABNORMAL HIGH (ref 26.0–34.0)
MCHC: 32.9 g/dL (ref 30.0–36.0)
MCV: 104.5 fL — ABNORMAL HIGH (ref 80.0–100.0)
Platelets: 473 K/uL — ABNORMAL HIGH (ref 150–400)
RBC: 2.21 MIL/uL — ABNORMAL LOW (ref 3.87–5.11)
RDW: 19.9 % — ABNORMAL HIGH (ref 11.5–15.5)
WBC: 46.3 K/uL — ABNORMAL HIGH (ref 4.0–10.5)
nRBC: 0.3 % — ABNORMAL HIGH (ref 0.0–0.2)

## 2024-04-12 LAB — APTT: aPTT: 42 s — ABNORMAL HIGH (ref 24–36)

## 2024-04-12 LAB — BASIC METABOLIC PANEL WITH GFR
Anion gap: 11 (ref 5–15)
BUN: 8 mg/dL (ref 8–23)
CO2: 21 mmol/L — ABNORMAL LOW (ref 22–32)
Calcium: 8.4 mg/dL — ABNORMAL LOW (ref 8.9–10.3)
Chloride: 112 mmol/L — ABNORMAL HIGH (ref 98–111)
Creatinine, Ser: 1.14 mg/dL — ABNORMAL HIGH (ref 0.44–1.00)
GFR, Estimated: 49 mL/min — ABNORMAL LOW (ref >60)
Glucose, Bld: 94 mg/dL (ref 70–99)
Potassium: 3.9 mmol/L (ref 3.5–5.1)
Sodium: 144 mmol/L (ref 135–145)

## 2024-04-12 SURGERY — INSERTION, ENDOVASCULAR STENT GRAFT, AORTA, THORACIC
Anesthesia: General | Site: Groin | Laterality: Right

## 2024-04-12 MED ORDER — DOCUSATE SODIUM 100 MG PO CAPS
100.0000 mg | ORAL_CAPSULE | Freq: Every day | ORAL | Status: DC
  Administered 2024-04-13 – 2024-04-14 (×2): 100 mg via ORAL
  Filled 2024-04-12 (×2): qty 1

## 2024-04-12 MED ORDER — ACETAMINOPHEN 650 MG RE SUPP: 325.0000 mg | RECTAL | Status: DC | PRN

## 2024-04-12 MED ORDER — POTASSIUM CHLORIDE CRYS ER 20 MEQ PO TBCR: 40.0000 meq | EXTENDED_RELEASE_TABLET | Freq: Every day | ORAL | Status: DC | PRN

## 2024-04-12 MED ORDER — LABETALOL HCL 5 MG/ML IV SOLN
INTRAVENOUS | Status: DC | PRN
  Administered 2024-04-12: 10 mg via INTRAVENOUS

## 2024-04-12 MED ORDER — POLYETHYLENE GLYCOL 3350 17 G PO PACK: 17.0000 g | PACK | Freq: Every day | ORAL | Status: DC | PRN

## 2024-04-12 MED ORDER — OXYCODONE HCL 5 MG PO TABS: 5.0000 mg | ORAL_TABLET | ORAL | Status: DC | PRN

## 2024-04-12 MED ORDER — PHENYLEPHRINE HCL-NACL 20-0.9 MG/250ML-% IV SOLN
INTRAVENOUS | Status: DC | PRN
  Administered 2024-04-12: 10 ug/min via INTRAVENOUS

## 2024-04-12 MED ORDER — FENTANYL CITRATE (PF) 100 MCG/2ML IJ SOLN: 25.0000 ug | INTRAMUSCULAR | Status: DC | PRN

## 2024-04-12 MED ORDER — CEFAZOLIN SODIUM-DEXTROSE 2-4 GM/100ML-% IV SOLN
2.0000 g | INTRAVENOUS | Status: AC
  Administered 2024-04-12: 2 g via INTRAVENOUS
  Filled 2024-04-12: qty 100

## 2024-04-12 MED ORDER — HEPARIN 6000 UNIT IRRIGATION SOLUTION
Status: DC | PRN
  Administered 2024-04-12: 1

## 2024-04-12 MED ORDER — CHLORHEXIDINE GLUCONATE 0.12 % MT SOLN
OROMUCOSAL | Status: AC
  Administered 2024-04-12: 15 mL via OROMUCOSAL
  Filled 2024-04-12: qty 15

## 2024-04-12 MED ORDER — ALBUMIN HUMAN 5 % IV SOLN: INTRAVENOUS | Status: DC | PRN

## 2024-04-12 MED ORDER — LEVOCETIRIZINE DIHYDROCHLORIDE 5 MG PO TABS: 5.0000 mg | ORAL_TABLET | Freq: Every evening | ORAL | Status: DC

## 2024-04-12 MED ORDER — ASPIRIN 81 MG PO TBEC
81.0000 mg | DELAYED_RELEASE_TABLET | Freq: Every day | ORAL | Status: DC
  Administered 2024-04-13 – 2024-04-14 (×2): 81 mg via ORAL
  Filled 2024-04-12 (×2): qty 1

## 2024-04-12 MED ORDER — METHOTREXATE SODIUM 2.5 MG PO TABS: 15.0000 mg | ORAL_TABLET | ORAL | Status: DC

## 2024-04-12 MED ORDER — HEPARIN SODIUM (PORCINE) 1000 UNIT/ML IJ SOLN
INTRAMUSCULAR | Status: DC | PRN
  Administered 2024-04-12: 6000 [IU] via INTRAVENOUS
  Administered 2024-04-12: 3000 [IU] via INTRAVENOUS

## 2024-04-12 MED ORDER — CHLORHEXIDINE GLUCONATE CLOTH 2 % EX PADS: 6.0000 | MEDICATED_PAD | Freq: Once | CUTANEOUS | Status: DC

## 2024-04-12 MED ORDER — SUGAMMADEX SODIUM 200 MG/2ML IV SOLN
INTRAVENOUS | Status: DC | PRN
  Administered 2024-04-12: 108.8 mg via INTRAVENOUS

## 2024-04-12 MED ORDER — HEPARIN 6000 UNIT IRRIGATION SOLUTION
Status: AC
  Filled 2024-04-12: qty 500

## 2024-04-12 MED ORDER — OXYCODONE HCL 5 MG PO TABS: 5.0000 mg | ORAL_TABLET | Freq: Once | ORAL | Status: DC | PRN

## 2024-04-12 MED ORDER — PROTAMINE SULFATE 10 MG/ML IV SOLN
INTRAVENOUS | Status: DC | PRN
  Administered 2024-04-12: 50 mg via INTRAVENOUS

## 2024-04-12 MED ORDER — ORAL CARE MOUTH RINSE: 15.0000 mL | Freq: Once | OROMUCOSAL | Status: AC

## 2024-04-12 MED ORDER — METOPROLOL TARTRATE 5 MG/5ML IV SOLN: 2.5000 mg | INTRAVENOUS | Status: DC | PRN

## 2024-04-12 MED ORDER — CEFAZOLIN SODIUM-DEXTROSE 2-4 GM/100ML-% IV SOLN
2.0000 g | Freq: Three times a day (TID) | INTRAVENOUS | Status: AC
  Administered 2024-04-12 (×2): 2 g via INTRAVENOUS
  Filled 2024-04-12 (×2): qty 100

## 2024-04-12 MED ORDER — FAMOTIDINE 20 MG PO TABS
40.0000 mg | ORAL_TABLET | Freq: Every day | ORAL | Status: DC
  Administered 2024-04-13 – 2024-04-14 (×2): 40 mg via ORAL
  Filled 2024-04-12 (×3): qty 2

## 2024-04-12 MED ORDER — ACETAMINOPHEN 10 MG/ML IV SOLN: 1000.0000 mg | Freq: Once | INTRAVENOUS | Status: DC | PRN

## 2024-04-12 MED ORDER — OXYCODONE HCL 5 MG/5ML PO SOLN: 5.0000 mg | Freq: Once | ORAL | Status: DC | PRN

## 2024-04-12 MED ORDER — LACTATED RINGERS IV SOLN: INTRAVENOUS | Status: DC

## 2024-04-12 MED ORDER — LIDOCAINE 2% (20 MG/ML) 5 ML SYRINGE
INTRAMUSCULAR | Status: DC | PRN
  Administered 2024-04-12: 40 mg via INTRAVENOUS

## 2024-04-12 MED ORDER — SODIUM CHLORIDE 0.9 % IV SOLN: INTRAVENOUS | Status: DC

## 2024-04-12 MED ORDER — PHENOL 1.4 % MT LIQD: 1.0000 | OROMUCOSAL | Status: DC | PRN

## 2024-04-12 MED ORDER — FOLIC ACID 1 MG PO TABS
1.0000 mg | ORAL_TABLET | Freq: Every day | ORAL | Status: DC
  Administered 2024-04-12 – 2024-04-14 (×3): 1 mg via ORAL
  Filled 2024-04-12 (×3): qty 1

## 2024-04-12 MED ORDER — SODIUM CHLORIDE 0.9 % IV SOLN: INTRAVENOUS | Status: AC

## 2024-04-12 MED ORDER — CETIRIZINE HCL 10 MG PO TABS
10.0000 mg | ORAL_TABLET | Freq: Every day | ORAL | Status: DC
  Administered 2024-04-12 – 2024-04-14 (×3): 10 mg via ORAL
  Filled 2024-04-12 (×3): qty 1

## 2024-04-12 MED ORDER — FENTANYL CITRATE (PF) 250 MCG/5ML IJ SOLN
INTRAMUSCULAR | Status: DC | PRN
  Administered 2024-04-12: 50 ug via INTRAVENOUS

## 2024-04-12 MED ORDER — DROPERIDOL 2.5 MG/ML IJ SOLN: 0.6250 mg | Freq: Once | INTRAMUSCULAR | Status: DC | PRN

## 2024-04-12 MED ORDER — ONDANSETRON HCL 4 MG/2ML IJ SOLN
INTRAMUSCULAR | Status: DC | PRN
  Administered 2024-04-12: 4 mg via INTRAVENOUS

## 2024-04-12 MED ORDER — PROPOFOL 10 MG/ML IV BOLUS
INTRAVENOUS | Status: DC | PRN
  Administered 2024-04-12: 70 mg via INTRAVENOUS

## 2024-04-12 MED ORDER — ONDANSETRON HCL 4 MG/2ML IJ SOLN: 4.0000 mg | Freq: Once | INTRAMUSCULAR | Status: DC | PRN

## 2024-04-12 MED ORDER — 0.9 % SODIUM CHLORIDE (POUR BTL) OPTIME
TOPICAL | Status: DC | PRN
  Administered 2024-04-12: 1000 mL

## 2024-04-12 MED ORDER — IODIXANOL 320 MG/ML IV SOLN
INTRAVENOUS | Status: DC | PRN
  Administered 2024-04-12: 96.8 mL via INTRA_ARTERIAL

## 2024-04-12 MED ORDER — ROCURONIUM BROMIDE 10 MG/ML (PF) SYRINGE
PREFILLED_SYRINGE | INTRAVENOUS | Status: DC | PRN
  Administered 2024-04-12: 20 mg via INTRAVENOUS
  Administered 2024-04-12: 50 mg via INTRAVENOUS

## 2024-04-12 MED ORDER — CLEVIDIPINE BUTYRATE 0.5 MG/ML IV EMUL
INTRAVENOUS | Status: DC | PRN
  Administered 2024-04-12: 3 mg/h via INTRAVENOUS

## 2024-04-12 MED ORDER — ONDANSETRON HCL 4 MG/2ML IJ SOLN
4.0000 mg | Freq: Once | INTRAMUSCULAR | Status: AC
  Administered 2024-04-12: 4 mg via INTRAVENOUS
  Filled 2024-04-12: qty 2

## 2024-04-12 MED ORDER — FENTANYL CITRATE (PF) 250 MCG/5ML IJ SOLN
INTRAMUSCULAR | Status: AC
  Filled 2024-04-12: qty 5

## 2024-04-12 MED ORDER — PHENYLEPHRINE 80 MCG/ML (10ML) SYRINGE FOR IV PUSH (FOR BLOOD PRESSURE SUPPORT)
PREFILLED_SYRINGE | INTRAVENOUS | Status: DC | PRN
  Administered 2024-04-12 (×2): 80 ug via INTRAVENOUS

## 2024-04-12 MED ORDER — CHLORHEXIDINE GLUCONATE 0.12 % MT SOLN: 15.0000 mL | Freq: Once | OROMUCOSAL | Status: AC

## 2024-04-12 MED ORDER — BISACODYL 5 MG PO TBEC: 5.0000 mg | DELAYED_RELEASE_TABLET | Freq: Every day | ORAL | Status: DC | PRN

## 2024-04-12 MED ORDER — ACETAMINOPHEN 325 MG PO TABS: 325.0000 mg | ORAL_TABLET | ORAL | Status: DC | PRN

## 2024-04-12 SURGICAL SUPPLY — 43 items
BAG COUNTER SPONGE SURGICOUNT (BAG) ×2 IMPLANT
BENZOIN TINCTURE PRP APPL 2/3 (GAUZE/BANDAGES/DRESSINGS) ×2 IMPLANT
CANISTER SUCTION 3000ML PPV (SUCTIONS) ×2 IMPLANT
CATH ACCU-VU SIZ PIG 5F 100CM (CATHETERS) ×2 IMPLANT
CATH VISIONS PV .035 IVUS (CATHETERS) IMPLANT
CHLORAPREP W/TINT 26 (MISCELLANEOUS) ×2 IMPLANT
CLIP LIGATING EXTRA MED SLVR (CLIP) IMPLANT
CLIP LIGATING EXTRA SM BLUE (MISCELLANEOUS) IMPLANT
DERMABOND ADVANCED .7 DNX12 (GAUZE/BANDAGES/DRESSINGS) IMPLANT
DEVICE CLOSURE PERCLS PRGLD 6F (Vascular Products) ×4 IMPLANT
DRSG TEGADERM 2-3/8X2-3/4 SM (GAUZE/BANDAGES/DRESSINGS) ×2 IMPLANT
ELECT CAUTERY BLADE 6.4 (BLADE) ×2 IMPLANT
ELECTRODE REM PT RTRN 9FT ADLT (ELECTROSURGICAL) ×4 IMPLANT
GLIDEWIRE ADV .035X260CM (WIRE) IMPLANT
GLOVE BIO SURGEON STRL SZ8 (GLOVE) ×2 IMPLANT
GOWN STRL REUS W/ TWL LRG LVL3 (GOWN DISPOSABLE) ×4 IMPLANT
GOWN STRL REUS W/ TWL XL LVL3 (GOWN DISPOSABLE) ×2 IMPLANT
GRAFT BALLN CATH 65CM (BALLOONS) IMPLANT
GRAFT ENDO ALPHA 2 24X105 (Graft) IMPLANT
GUIDEWIRE ANGLED .035X150CM (WIRE) IMPLANT
KIT BASIN OR (CUSTOM PROCEDURE TRAY) ×2 IMPLANT
KIT ENCORE 26 ADVANTAGE (KITS) IMPLANT
KIT TURNOVER KIT B (KITS) ×2 IMPLANT
PACK ENDOVASCULAR (PACKS) ×2 IMPLANT
PAD ARMBOARD POSITIONER FOAM (MISCELLANEOUS) ×4 IMPLANT
PENCIL BUTTON HOLSTER BLD 10FT (ELECTRODE) ×2 IMPLANT
PENCIL SMOKE EVACUATOR (MISCELLANEOUS) IMPLANT
SET MICROPUNCTURE 5F STIFF (MISCELLANEOUS) IMPLANT
SHEATH AVANTI 11CM 8FR (SHEATH) ×2 IMPLANT
SHEATH DRYSEAL FLEX 16FR 33CM (SHEATH) IMPLANT
SOLN 0.9% NACL 1000 ML (IV SOLUTION) ×2 IMPLANT
SOLN 0.9% NACL POUR BTL 1000ML (IV SOLUTION) ×2 IMPLANT
STOPCOCK MORSE 400PSI 3WAY (MISCELLANEOUS) ×2 IMPLANT
STRIP CLOSURE SKIN 1/2X4 (GAUZE/BANDAGES/DRESSINGS) ×4 IMPLANT
SUT MNCRL AB 4-0 PS2 18 (SUTURE) IMPLANT
SYR 50ML LL SCALE MARK (SYRINGE) ×2 IMPLANT
TOWEL GREEN STERILE (TOWEL DISPOSABLE) ×2 IMPLANT
TRAY FOLEY MTR SLVR 14FR STAT (SET/KITS/TRAYS/PACK) IMPLANT
TUBING HIGH PRESSURE 120CM (CONNECTOR) ×2 IMPLANT
TUBING INJECTOR 48 (MISCELLANEOUS) IMPLANT
WIRE BENTSON .035X145CM (WIRE) ×2 IMPLANT
WIRE LUNDERQUIST .035X180CM (WIRE) IMPLANT
WIRE STIFF LUNDERQUIST 260CM (WIRE) ×2 IMPLANT

## 2024-04-12 NOTE — Progress Notes (Addendum)
 Patient arrived at the unit from [facility] arrival pt is alert and oriented X4,CHG bath given,vitals takenCCMD notified,right  groin incision level 0,brisk pulse present on bilateral dorsalis pedis,call bell in reach,pt oriented to the unit

## 2024-04-12 NOTE — Interval H&P Note (Signed)
 History and Physical Interval Note:  04/12/2024 8:07 AM  Laurie Wallace  has presented today for surgery, with the diagnosis of aortic thrombus.  The various methods of treatment have been discussed with the patient and family. After consideration of risks, benefits and other options for treatment, the patient has consented to  Procedure(s): INSERTION, ENDOVASCULAR STENT GRAFT, AORTA, THORACIC (N/A) as a surgical intervention.  The patient's history has been reviewed, patient examined, no change in status, stable for surgery.  I have reviewed the patient's chart and labs.  Questions were answered to the patient's satisfaction.    Persistent leukocytosis since May, when she had COVID. No signs / symptoms of infection today beyond lingering cough from COVID. No fevers, chills, productive cough, dysuria, etc. Plan to proceed with TEVAR given high embolic risk of lesion in thoracic aorta.   Debby LOISE Robertson

## 2024-04-12 NOTE — Transfer of Care (Signed)
 Immediate Anesthesia Transfer of Care Note  Patient: Laurie Wallace  Procedure(s) Performed: INSERTION, ENDOVASCULAR STENT GRAFT, AORTA, THORACIC (Abdomen) IVUS (INTRAVASCULAR ULTRASOUND) (Right: Groin) ULTRASOUND GUIDANCE, FOR VASCULAR ACCESS (Right: Groin)  Patient Location: PACU  Anesthesia Type:General  Level of Consciousness: awake  Airway & Oxygen Therapy: Patient Spontanous Breathing  Post-op Assessment: Report given to RN and Post -op Vital signs reviewed and stable  Post vital signs: Reviewed and stable  Last Vitals:  Vitals Value Taken Time  BP 75/60 04/12/24 10:30  Temp 36.4 C 04/12/24 10:22  Pulse 83 04/12/24 10:34  Resp 28 04/12/24 10:34  SpO2 97 % 04/12/24 10:34  Vitals shown include unfiled device data.  Last Pain:  Vitals:   04/12/24 0734  TempSrc:   PainSc: 0-No pain         Complications: No notable events documented.

## 2024-04-12 NOTE — Anesthesia Preprocedure Evaluation (Addendum)
 Anesthesia Evaluation  Patient identified by MRN, date of birth, ID band Patient awake    Reviewed: Allergy & Precautions, NPO status , Patient's Chart, lab work & pertinent test results  Airway Mallampati: II       Dental  (+) Teeth Intact, Dental Advisory Given   Pulmonary PE   breath sounds clear to auscultation       Cardiovascular + Peripheral Vascular Disease and + DVT   Rhythm:Regular Rate:Normal     Neuro/Psych    GI/Hepatic ,GERD  Medicated,,  Endo/Other  neg diabetes    Renal/GU      Musculoskeletal  (+) Arthritis , Rheumatoid disorders,    Abdominal   Peds  Hematology  (+) Blood dyscrasia, anemia   Anesthesia Other Findings   Reproductive/Obstetrics                              Anesthesia Physical Anesthesia Plan  ASA: 3  Anesthesia Plan: General   Post-op Pain Management:    Induction: Intravenous  PONV Risk Score and Plan: 2 and Ondansetron  and Treatment may vary due to age or medical condition  Airway Management Planned: Oral ETT  Additional Equipment: Arterial line  Intra-op Plan:   Post-operative Plan: Extubation in OR  Informed Consent:      Dental advisory given  Plan Discussed with: CRNA and Surgeon  Anesthesia Plan Comments:          Anesthesia Quick Evaluation

## 2024-04-12 NOTE — Discharge Instructions (Signed)

## 2024-04-12 NOTE — Progress Notes (Signed)
 Patient complaints of pain in the A line insertion site ,PA Mckanzi notified,advice to remove A line,A line removed,pt tolerated well,insertion site clean,dry and intact

## 2024-04-12 NOTE — Anesthesia Procedure Notes (Signed)
 Procedure Name: Intubation Date/Time: 04/12/2024 8:45 AM  Performed by: Zelphia Norleen HERO, CRNAPre-anesthesia Checklist: Patient identified, Emergency Drugs available, Suction available and Patient being monitored Patient Re-evaluated:Patient Re-evaluated prior to induction Oxygen Delivery Method: Circle system utilized Preoxygenation: Pre-oxygenation with 100% oxygen Induction Type: IV induction Ventilation: Mask ventilation without difficulty Laryngoscope Size: Mac and 3 Grade View: Grade II Tube type: Oral Tube size: 7.0 mm Number of attempts: 1 Airway Equipment and Method: Stylet Placement Confirmation: ETT inserted through vocal cords under direct vision, positive ETCO2 and breath sounds checked- equal and bilateral Secured at: 20 cm Tube secured with: Tape Dental Injury: Teeth and Oropharynx as per pre-operative assessment

## 2024-04-12 NOTE — Op Note (Signed)
 DATE OF SERVICE: 04/12/2024  PATIENT:  Laurie Wallace  78 y.o. female  PRE-OPERATIVE DIAGNOSIS:  pedunculated thrombus in the descending thoracic aorta with high embolic potential  POST-OPERATIVE DIAGNOSIS:  Same  PROCEDURE:   1) ultrasound guided right common femoral artery access and large bore percutaneous closure (CPT [phone], [phone]) 2) intravascular ultrasound of ascending aorta, transverse aorta, descending thoracic aorta, abdominal aorta (CPT 37252) 3) thoracic endovascular aortic repair Cook Zenith 24 x (CPT [id number]) 4) arch, thoracic, abdominal aortogram and pelvic angiogram (CPT 36221, 24394, 24374, 24263)  SURGEON:  Surgeons and Role:    * Magda Debby SAILOR, MD - Primary  ASSISTANT: Ahmed Holster, PA-C  An experienced assistant was required given the complexity of this procedure and the standard of surgical care. My assistant helped with exposure through counter tension, suctioning, ligation and retraction to better visualize the surgical field.  My assistant expedited sewing during the case by following my sutures. Wherever I use the term we in the report, my assistant actively helped me with that portion of the procedure.  ANESTHESIA:   general  EBL: min  BLOOD ADMINISTERED:none  DRAINS: none   LOCAL MEDICATIONS USED:  NONE  SPECIMEN:  none  COUNTS: confirmed correct.  TOURNIQUET:  none  PATIENT DISPOSITION:  PACU - hemodynamically stable.   Delay start of Pharmacological VTE agent (>24hrs) due to surgical blood loss or risk of bleeding: no  INDICATION FOR PROCEDURE: Laurie Wallace is a 78 y.o. female with descending thoracic aortic pedunculated thrombus with high embolic risk. After careful discussion of risks, benefits, and alternatives the patient was offered coverage with TEVAR. The patient understood and wished to proceed.  OPERATIVE FINDINGS: Successful identification of thrombus as predicted by CT scan with IVUS.  Thrombus did  not opacify well on initial angiogram.  Good technical result from TEVAR.  Thrombus did not embolize on serial aortogram and pelvic angiogram.  Palpable pulses in the feet at completion.  DESCRIPTION OF PROCEDURE: After identification of the patient in the pre-operative holding area, the patient was transferred to the operating room. The patient was positioned supine on the operating room table. Anesthesia was induced. The chest, abdomen, pelvis were prepped and draped in standard fashion. A surgical pause was performed confirming correct patient, procedure, and operative location.  Using ultrasound guidance, the right common femoral artery was cannulated just proximal to its bifurcation.  Using Seldinger technique, micro sheath was introduced into the artery.  Bentson wire was advanced into the abdominal aorta.  The tract was dilated with a dilator of an 8 [place] sheath.  Small incision was made with an 11 blade.  Preclose sutures were placed at 10:00 and 2:00 in the artery for use at the end of the case.  Access was upsized to 8 [place].  The patient was systemically heparinized.  Activated clotting time measurements were used at the case to confirm adequate anticoagulation.  Under fluoroscopic guidance I navigated a Glidewire advantage into the ascending aorta.  Over the wire intravascular ultrasound was performed.  We did identify a mobile appearing thrombus in the descending thoracic aorta.  The screen was marked.  Next we performed angiogram of the descending thoracic aorta.  This did not opacify the thrombus very well, but the contrast timing was not ideal.  We elected to proceed with coverage of the affected segment.  Access was exchanged for a 16 French 24 x 105 mm Cook Zenith alpha thoracic endovascular aortic prosthesis.  This was delivered across the  premarked area and deployed in standard fashion.  Good technical result was achieved from deploying the device.  The device open nicely.  Angioplasty  was not performed to avoid smashing the thrombus out from behind the stent graft.  Angiography was then performed in stations starting at the ascending aorta and moving through the descending thoracic aorta into the abdominal aorta and finally the pelvic vessels.  No evidence of embolized thrombus was seen.  The celiac, SMA, bilateral renal arteries, bilateral iliac system was patent on angiogram.  No residual thrombus was seen in the descending aorta.  Satisfied when the case here.  Large sheath was removed and manual pressure held on the right common femoral artery access.  The previously placed Perclose sutures were secured with good hemostasis.  The wire was removed and suture secured.  Hemostasis was again achieved.  Excellent Doppler flow was heard in bilateral dorsalis pedis arteries.  Heparin was reversed with protamine.  The incision in the right groin was closed with a simple interrupted 4-0 Monocryl suture and Dermabond.  Upon completion of the case instrument and sharps counts were confirmed correct. The patient was transferred to the PACU in good condition. I was present for all portions of the procedure.  FOLLOW UP PLAN: Heme-onc consult to evaluate leukocytosis without clear source.  Concern for leukemia.  Continue aspirin.  Start DOAC therapy tomorrow unless heme-onc team prefers a different agent.  Follow-up with me in 1 month with CT angiogram of the chest, abdomen, and pelvis.  Debby SAILOR. Magda, MD [facility] Phone Number: [phone] 04/12/2024 10:22 AM

## 2024-04-12 NOTE — Consult Note (Cosign Needed)
 [facility]  [phone] [phone]    HEMATOLOGY CONSULTATION  PURPOSE OF CONSULTATION/CHIEF COMPLAINT:  Leukocytosis  Referring MD:  Dr. Magda   HPI: Laurie Wallace is a 78 year old female patient who presented on 04/12/2024 for surgery due to aortic thrombus.  She is status post stent graft of the aorta today.  Due to persistent leukocytosis since May, hematology evaluation is being requested. Patient is seen awake alert and oriented x 3.  She is a very pleasant lady.  Admits to some upper extremity soreness and lower back pain.  Reports lingering cough since having COVID last May.  Otherwise reports that she feels okay. Medical history includes elevated WBC since May when she had COVID.  As stated medical history significant for aortic thrombus.  She has a history of rheumatoid arthritis and has been on methotrexate .  Does not remember when the RA was diagnosed.  History of iron  deficiency anemia. Surgical history includes hysterectomy and toe surgery.  Status post endovascular aortic stent graft done today. Family hematologic/oncologic history is denied. Social history is noncontributory.  Denies tobacco use.  Denies alcohol use.  Denies illicit drug use.  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    ASSESSMENT AND PLAN:  Leukocytosis, chronic - Patient has had persistent leukocytosis since May 2025 when she had COVID.  Initial elevated WBC 25.8 on 10/31/2023.  WBC normalized in June 2025.  However has continued to increase to date. - Unclear etiology.   WBC elevated 46.3 today.  -- CBC significant for myeloid left shift with eosinophilia, could be reactive but is concerning for CML versus CMML.   - Medical oncology/Dr. Onesimo following and will make further evaluation and treatment recommendations.  Anemia, worsening History of iron  deficiency anemia - Hemoglobin low 7.6 -  Recommend PRBC transfusion for Hgb <7.0 - Continue to monitor CBC with differential  Thrombocytosis - Platelets 473K - Likely reactive - No intervention required at this time - Continue to monitor CBC with differential  Aortic thrombus - Imaging 03/31/2024 showed thrombus within the descending thoracic aorta increasing in size compared to previous study of 02/2024. - Previously on Eliquis . - Status post TEVAR today - Vascular surgery following  Rheumatoid arthritis - Patient states she was diagnosed several years ago and does not recall exactly when.  Has been on methotrexate . - Continue outpatient follow-up with rheumatologist   Past Medical History:  Diagnosis Date   Allergy    Anemia    Iron  Deficiency   Arthritis    Rheumatoid Arthritis   Family history of adverse reaction to anesthesia    Sister has N/V   GERD (gastroesophageal reflux disease)    Peripheral vascular disease    Pneumonia   :  Past Surgical History:  Procedure Laterality Date   ABDOMINAL HYSTERECTOMY  2000   TOE SURGERY Right 2015   1st toe  :  No Known Allergies:   Family History  Problem Relation Age of Onset   Cancer Mother    Cancer Father    Heart failure Sister    COPD Sister    Dementia Sister   :   Social History   Socioeconomic History   Marital status: Single    Spouse name: Not on file   Number of children: Not on file   Years of education: Not on file   Highest education level: Not on file  Occupational History  Occupation: full time  Tobacco Use   Smoking status: Never    Passive exposure: Never   Smokeless tobacco: Never  Vaping Use   Vaping status: Never Used  Substance and Sexual Activity   Alcohol use: No   Drug use: No   Sexual activity: Not Currently  Other Topics Concern   Not on file  Social History Narrative   Lives with her son   Right Handed   Drinks 1-2 cups caffeine daily in winter   Social Drivers of Health   Financial Resource Strain: Low  Risk  (06/14/2023)   Overall Financial Resource Strain (CARDIA)    Difficulty of Paying Living Expenses: Not hard at all  Food Insecurity: No Food Insecurity (09/29/2023)   Hunger Vital Sign    Worried About Running Out of Food in the Last Year: Never true    Ran Out of Food in the Last Year: Never true  Transportation Needs: No Transportation Needs (09/29/2023)   PRAPARE - Administrator, Civil Service (Medical): No    Lack of Transportation (Non-Medical): No  Physical Activity: Sufficiently Active (06/14/2023)   Exercise Vital Sign    Days of Exercise per Week: 5 days    Minutes of Exercise per Session: 30 min  Stress: No Stress Concern Present (06/14/2023)   [employer] of Occupational Health - Occupational Stress Questionnaire    Feeling of Stress : Not at all  Social Connections: Moderately Isolated (09/29/2023)   Social Connection and Isolation Panel    Frequency of Communication with Friends and Family: More than three times a week    Frequency of Social Gatherings with Friends and Family: More than three times a week    Attends Religious Services: More than 4 times per year    Active Member of [employer] or Organizations: No    Attends Banker Meetings: Never    Marital Status: Widowed  Intimate Partner Violence: Not At Risk (09/29/2023)   Humiliation, Afraid, Rape, and Kick questionnaire    Fear of Current or Ex-Partner: No    Emotionally Abused: No    Physically Abused: No    Sexually Abused: No  :   CURRENT MEDS: Current Facility-Administered Medications  Medication Dose Route Frequency Provider Last Rate Last Admin   0.9 %  sodium chloride  infusion   Intravenous Continuous Schuh, McKenzi P, PA-C       acetaminophen  (TYLENOL ) tablet 325-650 mg  325-650 mg Oral Q4H PRN Schuh, McKenzi P, PA-C       Or   acetaminophen  (TYLENOL ) suppository 325-650 mg  325-650 mg Rectal Q4H PRN Schuh, McKenzi P, PA-C       [START ON 04/13/2024] aspirin EC tablet 81 mg   81 mg Oral Daily Schuh, McKenzi P, PA-C       bisacodyl (DULCOLAX) EC tablet 5 mg  5 mg Oral Daily PRN Schuh, McKenzi P, PA-C       ceFAZolin (ANCEF) IVPB 2g/100 mL premix  2 g Intravenous Q8H Schuh, McKenzi P, PA-C       cetirizine (ZYRTEC) tablet 10 mg  10 mg Oral Daily Hawken, Thomas N, MD       [START ON 04/13/2024] docusate sodium (COLACE) capsule 100 mg  100 mg Oral Daily Schuh, McKenzi P, PA-C       [START ON 04/13/2024] famotidine  (PEPCID ) tablet 40 mg  40 mg Oral Daily Schuh, McKenzi P, PA-C       folic acid  (FOLVITE ) tablet 1 mg  1  mg Oral Daily Schuh, McKenzi P, PA-C       methotrexate  (RHEUMATREX) tablet 15 mg  15 mg Oral Weekly Schuh, McKenzi P, PA-C       metoprolol tartrate (LOPRESSOR) injection 2.5-5 mg  2.5-5 mg Intravenous Q2H PRN Schuh, McKenzi P, PA-C       oxyCODONE  (Oxy IR/ROXICODONE ) immediate release tablet 5-10 mg  5-10 mg Oral Q4H PRN Schuh, McKenzi P, PA-C       phenol (CHLORASEPTIC) mouth spray 1 spray  1 spray Mouth/Throat PRN Schuh, McKenzi P, PA-C       polyethylene glycol (MIRALAX  / GLYCOLAX ) packet 17 g  17 g Oral Daily PRN Schuh, McKenzi P, PA-C       potassium chloride  SA (KLOR-CON  M) CR tablet 40-60 mEq  40-60 mEq Oral Daily PRN Schuh, McKenzi P, PA-C        PHYSICAL EXAMINATION: ECOG PERFORMANCE STATUS: 3 - Symptomatic, >50% confined to bed  Vitals:   04/12/24 1245 04/12/24 1300  BP: (!) 99/42 (!) 94/56  Pulse: 87 83  Resp: 20 13  Temp:    SpO2: 95% 96%   Filed Weights   04/12/24 0656  Weight: 120 lb (54.4 kg)    GENERAL: alert, no distress and comfortable SKIN: skin color, texture, turgor are normal, no rashes or significant lesions EYES: normal, conjunctiva are pink and non-injected, sclera clear OROPHARYNX: no exudate, no erythema and lips, buccal mucosa, and tongue normal  NECK: supple, thyroid normal size, non-tender, without nodularity LYMPH: no palpable lymphadenopathy in the cervical, axillary or inguinal LUNGS: clear to auscultation  and percussion with normal breathing effort HEART: regular rate & rhythm and no murmurs and no lower extremity edema ABDOMEN: abdomen soft, non-tender and normal bowel sounds MUSCULOSKELETAL: no cyanosis of digits and no clubbing  PSYCH: alert & oriented x 3 with fluent speech NEURO: no focal motor/sensory deficits   LABS: Lab Results  Component Value Date   WBC 46.3 (H) 04/12/2024   HGB 7.6 (L) 04/12/2024   HCT 23.1 (L) 04/12/2024   MCV 104.5 (H) 04/12/2024   PLT 473 (H) 04/12/2024    Lab Results  Component Value Date   WBC 46.3 (H) 04/12/2024   HGB 7.6 (L) 04/12/2024   HCT 23.1 (L) 04/12/2024   PLT 473 (H) 04/12/2024   GLUCOSE 94 04/12/2024   ALT 25 04/11/2024   AST 31 04/11/2024   NA 144 04/12/2024   K 3.9 04/12/2024   CL 112 (H) 04/12/2024   CREATININE 1.14 (H) 04/12/2024   BUN 8 04/12/2024   CO2 21 (L) 04/12/2024   INR 1.3 (H) 04/11/2024   HGBA1C 5.8 (H) 01/10/2023    HYBRID OR IMAGING ([facility] ONLY) Result Date: 04/12/2024 There is no interpretation for this exam.  This order is for images obtained during a surgical procedure.  Please See Surgeries Tab for more information regarding the procedure.   CT ANGIO CHEST/ABD/PEL FOR DISSECTION W &/OR WO CONTRAST Result Date: 04/03/2024 CLINICAL DATA:  Embolism and thrombosis of the thoracic aorta EXAM: CT ANGIOGRAPHY CHEST, ABDOMEN AND PELVIS TECHNIQUE: Non-contrast CT of the chest was initially obtained. Multidetector CT imaging through the chest, abdomen and pelvis was performed using the standard protocol during bolus administration of intravenous contrast. Multiplanar reconstructed images and MIPs were obtained and reviewed to evaluate the vascular anatomy. RADIATION DOSE REDUCTION: This exam was performed according to the departmental dose-optimization program which includes automated exposure control, adjustment of the mA and/or kV according to patient size and/or use of  iterative reconstruction technique. CONTRAST:   OMNIPAQUE  IOHEXOL  350 MG/ML SOLN COMPARISON:  CT angiogram of the chest performed February 24, 2024 FINDINGS: CTA CHEST FINDINGS Cardiovascular: Normal size heart. Left ventricular hypertrophy. Motion related changes are present in the aortic root. The tubular ascending thoracic aorta is estimated at 2.8 cm in diameter. Proximal arch vessels are patent. Diffuse atherosclerotic changes are favored in the descending thoracic aorta. There is an exophytic focus of wall adherent thrombus / plaque which is observed on image 60 of CT series 6. This is slightly larger in axial diameter measuring 9 mm on the current exam, previously 8 mm. Overall, this finding is significantly progressed when compared to September 29, 2023. Although this narrows the descending thoracic lumen, the degree of luminal narrowing is less than 50%. No evidence of pulmonary embolus. Mediastinum/Nodes: Unchanged. Lungs/Pleura: Parenchymal nodules are present which are most notable in the right lower lobe. Overall, these are similar in location and morphology but slightly different in appearance when compared to the previous exam. Mild peribronchial thickening. No pleural effusion or pneumothorax. Musculoskeletal: No chest wall abnormality. No acute or significant osseous findings. Review of the MIP images confirms the above findings. CTA ABDOMEN AND PELVIS FINDINGS VASCULAR Aorta: Patent with minimal atherosclerotic change. Celiac: Patent. SMA: Patent. Renals: Patent. IMA: Patent. Inflow: Patent. Veins: No obvious venous abnormality within the limitations of this arterial phase study. Review of the MIP images confirms the above findings. NON-VASCULAR Hepatobiliary: Nothing significant. Pancreas: Unremarkable. No pancreatic ductal dilatation or surrounding inflammatory changes. Spleen: Normal in size without focal abnormality. Adrenals/Urinary Tract: The adrenal glands are unremarkable. There is mild dilation of the renal collecting systems and ureters  bilaterally. Urinary bladder is mildly distended. Stomach/Bowel: No dilated loops of bowel are seen. The appendix is well seen and within normal limits. Lymphatic: No significant lymphadenopathy. Reproductive: Status post hysterectomy. No adnexal masses. Other: Nothing significant. Musculoskeletal: Degenerative changes in the lumbar spine, worse at L5-S1. Review of the MIP images confirms the above findings. IMPRESSION: 1. Previously observed wall adherent thrombus/plaque within the descending thoracic aorta is slightly increased in size compared to August 2025. Using axial reference imaging, this measures 9 mm, previously 8 mm. 2. No abdominal aortic aneurysm. Electronically Signed   By: Maude Naegeli M.D.   On: 04/03/2024 06:56     The total time spent in the appointment was 60 minutes encounter with patients including review of chart and various tests results, discussions about plan of care and coordination of care plan   All questions were answered. The patient knows to call the clinic with any problems, questions or concerns. No barriers to learning was detected.  Thank you for the courtesy of this consultation, Olam JINNY Brunner, NP  10/8/20251:27 PM  ADDENDUM  .Patient was Personally and independently interviewed, examined and relevant elements of the history of present illness were reviewed in details and an assessment and plan was created. All elements of the patient's history of present illness , assessment and plan were discussed in details with **. The above documentation reflects our combined findings assessment and plan.   .    Latest Ref Rng & Units 04/13/2024    2:00 PM 04/13/2024    3:30 AM 04/12/2024   11:10 AM  CBC  WBC 4.0 - 10.5 K/uL 23.2  27.0  46.3   Hemoglobin 12.0 - 15.0 g/dL 8.6  6.8  7.6   Hematocrit 36.0 - 46.0 % 26.1  21.2  23.1   Platelets 150 -  400 K/uL 324  419  473     .CBC    Component Value Date/Time   WBC 23.2 (H) 04/13/2024 1400   RBC 2.67 (L) 04/13/2024  1400   HGB 8.6 (L) 04/13/2024 1400   HGB 10.8 (L) 10/13/2023 1126   HCT 26.1 (L) 04/13/2024 1400   HCT 33.1 (L) 10/13/2023 1126   PLT 324 04/13/2024 1400   PLT Comment 10/13/2023 1126   MCV 97.8 04/13/2024 1400   MCV 102 (H) 10/13/2023 1126   MCH 32.2 04/13/2024 1400   MCHC 33.0 04/13/2024 1400   RDW 22.5 (H) 04/13/2024 1400   RDW 18.6 (H) 10/13/2023 1126   LYMPHSABS 8.4 (H) 04/11/2024 1400   LYMPHSABS 3.1 10/13/2023 1126   MONOABS 8.4 (H) 04/11/2024 1400   EOSABS 2.8 (H) 04/11/2024 1400   EOSABS 0.5 (H) 10/13/2023 1126   BASOSABS 0.3 (H) 04/11/2024 1400   BASOSABS 0.1 10/13/2023 1126    Patient with  Persistent Monocytosis/neutrophilia and myeloid left shift since atleast 01/2023 with increased recent eosinophilia and basophilia. Overall picture concerning for possible MPN -- likely CMML vs CML. -clonal markers to evaluate MPN ordered. -once stable from surgical [place]ont she will need an unilateral iliac crest bone marrow aspiration and biopsy  by IR. -will setup outpatient outpatient hematology f/u to f/u on lab results  Emaline Saran MD MS   Emaline Saran MD MS

## 2024-04-12 NOTE — Anesthesia Procedure Notes (Signed)
 Arterial Line Insertion Start/End10/02/2024 7:10 AM, 04/12/2024 7:15 AM Performed by: Zelphia Norleen HERO, CRNA  Patient location: Pre-op. Preanesthetic checklist: patient identified, IV checked, site marked, risks and benefits discussed, surgical consent, monitors and equipment checked, pre-op evaluation, timeout performed and anesthesia consent Lidocaine 1% used for infiltration Right, radial was placed Catheter size: 20 G Hand hygiene performed  and maximum sterile barriers used   Attempts: 1 Following insertion, dressing applied and Biopatch. Post procedure assessment: normal and unchanged  Patient tolerated the procedure well with no immediate complications.

## 2024-04-12 NOTE — Anesthesia Postprocedure Evaluation (Signed)
 Anesthesia Post Note  Patient: Laurie Wallace  Procedure(s) Performed: INSERTION, ENDOVASCULAR STENT GRAFT, AORTA, THORACIC (Abdomen) IVUS (INTRAVASCULAR ULTRASOUND) (Right: Groin) ULTRASOUND GUIDANCE, FOR VASCULAR ACCESS (Right: Groin)     Patient location during evaluation: PACU Anesthesia Type: General Level of consciousness: awake Pain management: pain level controlled Vital Signs Assessment: post-procedure vital signs reviewed and stable Respiratory status: spontaneous breathing Cardiovascular status: blood pressure returned to baseline Postop Assessment: no headache and no apparent nausea or vomiting Anesthetic complications: no   No notable events documented.               [name]

## 2024-04-13 ENCOUNTER — Inpatient Hospital Stay: Admitting: Oncology

## 2024-04-13 ENCOUNTER — Encounter (HOSPITAL_COMMUNITY): Payer: Self-pay | Admitting: Vascular Surgery

## 2024-04-13 ENCOUNTER — Ambulatory Visit: Admitting: Internal Medicine

## 2024-04-13 DIAGNOSIS — D6489 Other specified anemias: Secondary | ICD-10-CM

## 2024-04-13 DIAGNOSIS — D72829 Elevated white blood cell count, unspecified: Secondary | ICD-10-CM

## 2024-04-13 DIAGNOSIS — D72821 Monocytosis (symptomatic): Secondary | ICD-10-CM

## 2024-04-13 DIAGNOSIS — D471 Chronic myeloproliferative disease: Secondary | ICD-10-CM

## 2024-04-13 DIAGNOSIS — Z95828 Presence of other vascular implants and grafts: Secondary | ICD-10-CM

## 2024-04-13 LAB — BASIC METABOLIC PANEL WITH GFR
Anion gap: 11 (ref 5–15)
BUN: 8 mg/dL (ref 8–23)
CO2: 22 mmol/L (ref 22–32)
Calcium: 8.2 mg/dL — ABNORMAL LOW (ref 8.9–10.3)
Chloride: 107 mmol/L (ref 98–111)
Creatinine, Ser: 1.37 mg/dL — ABNORMAL HIGH (ref 0.44–1.00)
GFR, Estimated: 40 mL/min — ABNORMAL LOW (ref >60)
Glucose, Bld: 97 mg/dL (ref 70–99)
Potassium: 3.9 mmol/L (ref 3.5–5.1)
Sodium: 140 mmol/L (ref 135–145)

## 2024-04-13 LAB — CBC
HCT: 21.2 % — ABNORMAL LOW (ref 36.0–46.0)
HCT: 26.1 % — ABNORMAL LOW (ref 36.0–46.0)
Hemoglobin: 6.8 g/dL — CL (ref 12.0–15.0)
Hemoglobin: 8.6 g/dL — ABNORMAL LOW (ref 12.0–15.0)
MCH: 33 pg (ref 26.0–34.0)
MCH: 32.2 pg (ref 26.0–34.0)
MCHC: 32.1 g/dL (ref 30.0–36.0)
MCHC: 33 g/dL (ref 30.0–36.0)
MCV: 102.9 fL — ABNORMAL HIGH (ref 80.0–100.0)
MCV: 97.8 fL (ref 80.0–100.0)
Platelets: 419 K/uL — ABNORMAL HIGH (ref 150–400)
Platelets: 324 K/uL (ref 150–400)
RBC: 2.06 MIL/uL — ABNORMAL LOW (ref 3.87–5.11)
RBC: 2.67 MIL/uL — ABNORMAL LOW (ref 3.87–5.11)
RDW: 20.3 % — ABNORMAL HIGH (ref 11.5–15.5)
RDW: 22.5 % — ABNORMAL HIGH (ref 11.5–15.5)
WBC: 27 K/uL — ABNORMAL HIGH (ref 4.0–10.5)
WBC: 23.2 K/uL — ABNORMAL HIGH (ref 4.0–10.5)
nRBC: 0.2 % (ref 0.0–0.2)
nRBC: 0.3 % — ABNORMAL HIGH (ref 0.0–0.2)

## 2024-04-13 LAB — PREPARE RBC (CROSSMATCH)

## 2024-04-13 MED ORDER — ONDANSETRON HCL 4 MG/2ML IJ SOLN
4.0000 mg | Freq: Three times a day (TID) | INTRAMUSCULAR | Status: DC | PRN
  Administered 2024-04-13: 4 mg via INTRAVENOUS
  Filled 2024-04-13: qty 2

## 2024-04-13 MED ORDER — SODIUM CHLORIDE 0.9% IV SOLUTION
Freq: Once | INTRAVENOUS | Status: DC
Start: 2024-04-13 — End: 2024-04-14

## 2024-04-13 MED ORDER — SODIUM CHLORIDE 0.9% IV SOLUTION: Freq: Once | INTRAVENOUS | Status: DC

## 2024-04-13 NOTE — Progress Notes (Signed)
 Notified physician of critical hgb, orders given.

## 2024-04-13 NOTE — Plan of Care (Signed)
  Problem: Education: Goal: Knowledge of General Education information will improve Description: Including pain rating scale, medication(s)/side effects and non-pharmacologic comfort measures Outcome: Progressing   Problem: Health Behavior/Discharge Planning: Goal: Ability to manage health-related needs will improve Outcome: Progressing   Problem: Clinical Measurements: Goal: Ability to maintain clinical measurements within normal limits will improve Outcome: Progressing Goal: Will remain free from infection Outcome: Progressing Goal: Diagnostic test results will improve Outcome: Progressing Goal: Respiratory complications will improve Outcome: Progressing Goal: Cardiovascular complication will be avoided Outcome: Progressing   Problem: Activity: Goal: Risk for activity intolerance will decrease Outcome: Progressing   Problem: Nutrition: Goal: Adequate nutrition will be maintained Outcome: Progressing   Problem: Coping: Goal: Level of anxiety will decrease Outcome: Progressing   Problem: Elimination: Goal: Will not experience complications related to bowel motility Outcome: Progressing Goal: Will not experience complications related to urinary retention Outcome: Progressing   Problem: Pain Managment: Goal: General experience of comfort will improve and/or be controlled Outcome: Progressing   Problem: Safety: Goal: Ability to remain free from injury will improve Outcome: Progressing   Problem: Skin Integrity: Goal: Risk for impaired skin integrity will decrease Outcome: Progressing   Problem: Education: Goal: Knowledge of discharge needs will improve Outcome: Progressing   Problem: Clinical Measurements: Goal: Postoperative complications will be avoided or minimized Outcome: Progressing   Problem: Respiratory: Goal: Will achieve and/or maintain a regular respiratory rate, without signs or symptoms of dyspnea Outcome: Progressing   Problem: Skin  Integrity: Goal: Demonstration of wound healing without infection will improve Outcome: Progressing   Problem: Education: Goal: Knowledge of discharge needs will improve Outcome: Progressing   Problem: Clinical Measurements: Goal: Postoperative complications will be avoided or minimized Outcome: Progressing   Problem: Respiratory: Goal: Will achieve and/or maintain a regular respiratory rate, without signs or symptoms of dyspnea Outcome: Progressing   Problem: Skin Integrity: Goal: Demonstration of wound healing without infection will improve Outcome: Progressing

## 2024-04-13 NOTE — Progress Notes (Signed)
 [facility] [place] PROGRESS NOTE  ASSESSMENT / PLAN: Laurie Wallace is a 78 y.o. female s/p TEVAR for pedunculated descending thoracic aortic thrombus. Good result achieved. Anemia this morning with PRBC infusing. Appreciate heme/onc evaluation and recommendations. Voiding trial this morning. Diet as tolerated. Mobilize as able. Discharge this afternoon if patient feeling well and heme/onc agrees.    SUBJECTIVE: Mild pain in the mid thoracic back. Mild pain in access site. No other complaints this AM. Reviewed OR findings.   OBJECTIVE: BP (!) 108/40   Pulse 81   Temp 99.3 F (37.4 C) (Oral)   Resp 20   Ht 5' 6 (1.676 m)   Wt 54.4 kg   SpO2 95%   BMI 19.37 kg/m   Intake/Output Summary (Last 24 hours) at 04/13/2024 0732 Last data filed at 04/13/2024 0600 Gross per 24 hour  Intake 1905.32 ml  Output 5485 ml  Net -3579.68 ml    Elderly woman in no distress Regular rate and rhythm Unlabored breathing 2+ femoral pulses bilaterally R femoral access site without hematoma 1+ Dps bilaterally     Latest Ref Rng & Units 04/13/2024    3:30 AM 04/12/2024   11:10 AM 04/11/2024    2:00 PM  CBC  WBC 4.0 - 10.5 K/uL 27.0  46.3  35.0    34.9   Hemoglobin 12.0 - 15.0 g/dL 6.8  7.6  8.3    8.4   Hematocrit 36.0 - 46.0 % 21.2  23.1  26.5    26.3   Platelets 150 - 400 K/uL 419  473  530    364         Latest Ref Rng & Units 04/13/2024    3:30 AM 04/12/2024   11:10 AM 04/11/2024    2:00 PM  CMP  Glucose 70 - 99 mg/dL 97  94  94   BUN 8 - 23 mg/dL 8  8  6    Creatinine 0.44 - 1.00 mg/dL 8.62  8.85  8.82   Sodium 135 - 145 mmol/L 140  144  140   Potassium 3.5 - 5.1 mmol/L 3.9  3.9  4.5   Chloride 98 - 111 mmol/L 107  112  106   CO2 22 - 32 mmol/L 22  21  26    Calcium 8.9 - 10.3 mg/dL 8.2  8.4  9.1   Total Protein 6.5 - 8.1 g/dL   6.8   Total Bilirubin 0.0 - 1.2 mg/dL   0.6   Alkaline Phos 38 - 126 U/L   90   AST 15 - 41 U/L   31   ALT 0 - 44 U/L    25     Estimated Creatinine Clearance: 29.1 mL/min (A) (by C-G formula based on SCr of 1.37 mg/dL (H)).  Debby SAILOR. Magda, MD [facility] Phone Number: [phone] 04/13/2024 7:32 AM

## 2024-04-14 ENCOUNTER — Ambulatory Visit: Admitting: Internal Medicine

## 2024-04-14 LAB — TYPE AND SCREEN
ABO/RH(D): O POS
Antibody Screen: NEGATIVE
Unit division: 0

## 2024-04-14 LAB — BASIC METABOLIC PANEL WITH GFR
Anion gap: 11 (ref 5–15)
BUN: 10 mg/dL (ref 8–23)
CO2: 24 mmol/L (ref 22–32)
Calcium: 8.7 mg/dL — ABNORMAL LOW (ref 8.9–10.3)
Chloride: 105 mmol/L (ref 98–111)
Creatinine, Ser: 1.37 mg/dL — ABNORMAL HIGH (ref 0.44–1.00)
GFR, Estimated: 40 mL/min — ABNORMAL LOW (ref >60)
Glucose, Bld: 92 mg/dL (ref 70–99)
Potassium: 3.9 mmol/L (ref 3.5–5.1)
Sodium: 140 mmol/L (ref 135–145)

## 2024-04-14 LAB — LACTATE DEHYDROGENASE: LDH: 339 U/L — ABNORMAL HIGH (ref 98–192)

## 2024-04-14 LAB — BPAM RBC
Blood Product Expiration Date: 202511022359
ISSUE DATE / TIME: 202510090547
Unit Type and Rh: 5100

## 2024-04-14 NOTE — Progress Notes (Signed)
  Progress Note    04/14/2024 8:02 AM 2 Days Post-Op  Subjective:  no complaints   Vitals:   04/14/24 0325 04/14/24 0722  BP: (!) 129/58 (!) 133/51  Pulse: 91 99  Resp: 18 17  Temp: 98.8 F (37.1 C) 98 F (36.7 C)  SpO2: 96% 99%   Physical Exam: Cardiac:  regular Lungs:  non labored Incisions:  right CF access site soft without swelling or hematoma Extremities:  2+ radial pulses, 2+ Dp pulses bilaterally. Moving all extremities without deficits Abdomen:  soft, non tender Neurologic: alert and oriented  CBC    Component Value Date/Time   WBC 23.2 (H) 04/13/2024 1400   RBC 2.67 (L) 04/13/2024 1400   HGB 8.6 (L) 04/13/2024 1400   HGB 10.8 (L) 10/13/2023 1126   HCT 26.1 (L) 04/13/2024 1400   HCT 33.1 (L) 10/13/2023 1126   PLT 324 04/13/2024 1400   PLT Comment 10/13/2023 1126   MCV 97.8 04/13/2024 1400   MCV 102 (H) 10/13/2023 1126   MCH 32.2 04/13/2024 1400   MCHC 33.0 04/13/2024 1400   RDW 22.5 (H) 04/13/2024 1400   RDW 18.6 (H) 10/13/2023 1126   LYMPHSABS 8.4 (H) 04/11/2024 1400   LYMPHSABS 3.1 10/13/2023 1126   MONOABS 8.4 (H) 04/11/2024 1400   EOSABS 2.8 (H) 04/11/2024 1400   EOSABS 0.5 (H) 10/13/2023 1126   BASOSABS 0.3 (H) 04/11/2024 1400   BASOSABS 0.1 10/13/2023 1126    BMET    Component Value Date/Time   NA 140 04/14/2024 0344   NA 140 10/13/2023 1126   K 3.9 04/14/2024 0344   CL 105 04/14/2024 0344   CO2 24 04/14/2024 0344   GLUCOSE 92 04/14/2024 0344   BUN 10 04/14/2024 0344   BUN 9 10/13/2023 1126   CREATININE 1.37 (H) 04/14/2024 0344   CREATININE 1.15 (H) 03/27/2024 1205   CALCIUM 8.7 (L) 04/14/2024 0344   GFRNONAA 40 (L) 04/14/2024 0344   GFRAA 88 09/28/2014 0947    INR    Component Value Date/Time   INR 1.3 (H) 04/11/2024 1400     Intake/Output Summary (Last 24 hours) at 04/14/2024 0802 Last data filed at 04/14/2024 0600 Gross per 24 hour  Intake 474 ml  Output --  Net 474 ml     Assessment/Plan:  78 y.o. female is  s/p TEVAR for pedunculated descending thoracic aortic thrombus  2 Days Post-Op   Extremities all remain well perfused and warm with palpable radial and DP pulses  Right femoral access site soft without swelling or hematoma H&H stable post transfusion Chronic leukocytosis, thrombocytosis, anemia concerning for CML of CMML She will have outpatient Heme/Onc follow up arranged Stable for discharge home today She will follow up with Dr. Magda in 1 month with CTA chest/ abd  Teretha Damme, PA-C [facility] [phone] 04/14/2024 8:02 AM

## 2024-04-14 NOTE — Evaluation (Signed)
 Physical Therapy Evaluation Patient Details Name: Laurie Wallace MRN: [id number] DOB: July 07, 1945 Today's Date: 04/14/2024  History of Present Illness  Pt is a 78 y.o female admitted 10/8 scheduled  insertion of endovascular aortic thoracic stent graft. EFY:jnmupr thrombus, RA, anemia, COPD  Clinical Impression  Pt is currently Mod I to Ind for all functional mobility including bed mobility, sit to stand, gait and stairs per home set up without an AD. SBA was provided for stairs for safety but pt is mostly supervision to Mod I with use of rail. Currently pt is presenting at baseline level of functioning and no skilled physical therapy services recommended. Pt will be discharged from skilled physical therapy services at this time; please re-consult if further needs arise.            If plan is discharge home, recommend the following: Assist for transportation;Help with stairs or ramp for entrance     Equipment Recommendations None recommended by PT     Functional Status Assessment Patient has not had a recent decline in their functional status     Precautions / Restrictions Precautions Precautions: None Recall of Precautions/Restrictions: Intact Restrictions Weight Bearing Restrictions Per Provider Order: No      Mobility  Bed Mobility Overal bed mobility: Modified Independent       General bed mobility comments: HOB slightly elevated    Transfers Overall transfer level: Modified independent Equipment used: None       General transfer comment: stabilizes self on bed with unilateral UE on standing    Ambulation/Gait Ambulation/Gait assistance: Modified independent (Device/Increase time) Gait Distance (Feet): 250 Feet Assistive device: None Gait Pattern/deviations: Step-through pattern, Decreased stride length Gait velocity: slightly decreased Gait velocity interpretation: 1.31 - 2.62 ft/sec, indicative of limited community ambulator   General Gait Details:  no significant gait deviations. Decreased arm swing  Stairs Stairs: Yes Stairs assistance: Supervision Stair Management: One rail Left, Step to pattern, Forwards Number of Stairs: 4 General stair comments: SBA for safety due to first time since surgery. Pt is supervision to Mod I has assistance of granddaughter at home.    Balance Overall balance assessment: Modified Independent (no overt LOB, occasional use of UE to stabilize when first standing.)         Pertinent Vitals/Pain Pain Assessment Pain Assessment: No/denies pain    Home Living Family/patient expects to be discharged to:: Private residence Living Arrangements: Alone Available Help at Discharge: Family;Available PRN/intermittently Type of Home: Mobile home Home Access: Stairs to enter Entrance Stairs-Rails: Can reach both Entrance Stairs-Number of Steps: 4   Home Layout: One level Home Equipment: None      Prior Function Prior Level of Function : Independent/Modified Independent             Mobility Comments: No AD, denies falls       Extremity/Trunk Assessment   Upper Extremity Assessment Upper Extremity Assessment: Defer to OT evaluation    Lower Extremity Assessment Lower Extremity Assessment: Overall WFL for tasks assessed    Cervical / Trunk Assessment Cervical / Trunk Assessment: Normal  Communication   Communication Communication: No apparent difficulties    Cognition Arousal: Alert Behavior During Therapy: WFL for tasks assessed/performed   PT - Cognitive impairments: No apparent impairments   Following commands: Intact       Cueing Cueing Techniques: Verbal cues     General Comments General comments (skin integrity, edema, etc.): No signs/symptoms of cardiac/respiratory distress during session  Assessment/Plan    PT Assessment Patient does not need any further PT services         PT Goals (Current goals can be found in the Care Plan section)  Acute Rehab PT  Goals PT Goal Formulation: All assessment and education complete, DC therapy     AM-PAC PT 6 Clicks Mobility  Outcome Measure Help needed turning from your back to your side while in a flat bed without using bedrails?: None Help needed moving from lying on your back to sitting on the side of a flat bed without using bedrails?: None Help needed moving to and from a bed to a chair (including a wheelchair)?: None Help needed standing up from a chair using your arms (e.g., wheelchair or bedside chair)?: None Help needed to walk in hospital room?: None Help needed climbing 3-5 steps with a railing? : A Little 6 Click Score: 23    End of Session Equipment Utilized During Treatment: Gait belt Activity Tolerance: Patient tolerated treatment well Patient left: in bed;with call bell/phone within reach Nurse Communication: Mobility status      Time: 8971-8961 PT Time Calculation (min) (ACUTE ONLY): 10 min   Charges:   PT Evaluation $PT Eval Low Complexity: 1 Low   PT General Charges $$ ACUTE PT VISIT: 1 Visit        Dorothyann Maier, DPT, CLT  Acute Rehabilitation Services Office: [phone] (Secure chat preferred)   Dorothyann VEAR Maier 04/14/2024, 10:43 AM

## 2024-04-14 NOTE — Evaluation (Signed)
 Occupational Therapy Evaluation Patient Details Name: Laurie Wallace MRN: [id number] DOB: January 22, 1946 Today's Date: 04/14/2024   History of Present Illness   Pt is a 78 y.o female admitted 10/8 scheduled  insertion of endovascular aortic thoracic stent graft. EFY:jnmupr thrombus, RA, anemia, COPD     Clinical Impressions Pt admitted based on above, and was seen based on problem list below. PTA pt was independent with ADLs and IADLs. Today pt is at her functional baseline for ADLs of mod I. Pt completing toilet transfer, standing ADLs, and LB dressing at mod I. Mild sway observed during mobility, but will defer to PT eval for further assessment. No follow up OT or DME needs. Pt currently preparing for d/c, will d/c acute therapy services.     If plan is discharge home, recommend the following:   Assist for transportation     Functional Status Assessment   Patient has not had a recent decline in their functional status     Equipment Recommendations   None recommended by OT      Precautions/Restrictions   Precautions Precautions: Fall Recall of Precautions/Restrictions: Intact Restrictions Weight Bearing Restrictions Per Provider Order: No     Mobility Bed Mobility Overal bed mobility: Modified Independent     General bed mobility comments: From flat bed    Transfers Overall transfer level: Modified independent Equipment used: None       General transfer comment: Mild sway during mobility, no AD      Balance Overall balance assessment: Mild deficits observed, not formally tested       ADL either performed or assessed with clinical judgement   ADL Overall ADL's : Modified independent;At baseline     General ADL Comments: Pt able to complete LB dressing, standing ADLs, and transfers with supervision to mod I     Vision Baseline Vision/History: 0 No visual deficits Patient Visual Report: No change from baseline Vision Assessment?: No  apparent visual deficits         Extremity/Trunk Assessment Upper Extremity Assessment Upper Extremity Assessment: Generalized weakness   Lower Extremity Assessment Lower Extremity Assessment: Defer to PT evaluation   Cervical / Trunk Assessment Cervical / Trunk Assessment: Normal   Communication Communication Communication: No apparent difficulties   Cognition Arousal: Alert Behavior During Therapy: WFL for tasks assessed/performed Cognition: No apparent impairments       Following commands: Intact       Cueing  General Comments   Cueing Techniques: Verbal cues  VSS on RA           Home Living Family/patient expects to be discharged to:: Private residence Living Arrangements: Alone Available Help at Discharge: Family;Available PRN/intermittently Type of Home: Mobile home Home Access: Stairs to enter Entrance Stairs-Number of Steps: 4 Entrance Stairs-Rails: Can reach both Home Layout: One level     Bathroom Shower/Tub: Chief Strategy Officer: Standard     Home Equipment: None          Prior Functioning/Environment Prior Level of Function : Independent/Modified Independent             Mobility Comments: No AD, denies falls ADLs Comments: Ind    OT Problem List: Impaired balance (sitting and/or standing);Cardiopulmonary status limiting activity        OT Goals(Current goals can be found in the care plan section)   Acute Rehab OT Goals Patient Stated Goal: To go home OT Goal Formulation: All assessment and education complete, DC therapy Time For Goal Achievement: 04/28/24  Potential to Achieve Goals: Good   AM-PAC OT 6 Clicks Daily Activity     Outcome Measure Help from another person eating meals?: None Help from another person taking care of personal grooming?: None Help from another person toileting, which includes using toliet, bedpan, or urinal?: None Help from another person bathing (including washing, rinsing,  drying)?: None Help from another person to put on and taking off regular upper body clothing?: None Help from another person to put on and taking off regular lower body clothing?: None 6 Click Score: 24   End of Session Equipment Utilized During Treatment: Gait belt Nurse Communication: Mobility status  Activity Tolerance: Patient tolerated treatment well Patient left: in bed;with call bell/phone within reach  OT Visit Diagnosis: Unsteadiness on feet (R26.81);Other abnormalities of gait and mobility (R26.89)                Time: 9046-8988 OT Time Calculation (min): 18 min Charges:  OT General Charges $OT Visit: 1 Visit OT Evaluation $OT Eval Moderate Complexity: 1 Mod  Shemika Robbs C, OT  Acute Rehabilitation Services Office [phone] Secure chat preferred   Adrianne GORMAN Savers 04/14/2024, 11:02 AM

## 2024-04-17 ENCOUNTER — Other Ambulatory Visit: Payer: Self-pay

## 2024-04-17 ENCOUNTER — Telehealth: Payer: Self-pay

## 2024-04-17 DIAGNOSIS — I7410 Embolism and thrombosis of unspecified parts of aorta: Secondary | ICD-10-CM

## 2024-04-17 LAB — SURGICAL PATHOLOGY

## 2024-04-17 LAB — KAPPA/LAMBDA LIGHT CHAINS
Kappa free light chain: 24.1 mg/L — ABNORMAL HIGH (ref 3.3–19.4)
Kappa, lambda light chain ratio: 0.95 (ref 0.26–1.65)
Lambda free light chains: 25.5 mg/L (ref 5.7–26.3)

## 2024-04-17 NOTE — Patient Instructions (Signed)
 Visit Information  Thank you for taking time to visit with me today. Please don't hesitate to contact me if I can be of assistance to you before our next scheduled telephone appointment.  Our next appointment is by telephone on 04/26/24 at 11:00 AM  Following is a copy of your care plan:   Goals Addressed             This Visit's Progress    [facility] Transitions of Care (TOC) Care Plan       Problems:  Recent Hospitalization for treatment of S/P insertion of endovascular thoracic aortic stent graft  Knowledge Deficit Related to post hospital wound care  and No Hospital Follow Up Provider appointment patient currently on waiting list  Goal:  Over the next 30 days, the patient will not experience hospital readmission  Interventions:  Transitions of Care: Doctor Visits  - discussed the importance of doctor visits Contacted provider for patient needs medication and follow up for complaints of fever, swelling in wound, drainage or pus  Patient Self Care Activities:  Attend all scheduled provider appointments Call pharmacy for medication refills 3-7 days in advance of running out of medications Call provider office for new concerns or questions  Notify RN Care Manager of TOC call rescheduling needs Participate in Transition of Care Program/Attend TOC scheduled calls Take medications as prescribed    Plan:  The care management team will reach out to the patient again over the next 5-10 business days. The patient has been provided with contact information for the care management team and has been advised to call with any health related questions or concerns.         Patient verbalizes understanding of instructions and care plan provided today and agrees to view in MyChart. Active MyChart status and patient understanding of how to access instructions and care plan via MyChart confirmed with patient.    Patient states her granddaughter reviews and get appointments and information to her  and granddaughter gets text messages for appointments.  The patient has been provided with contact information for the care management team and has been advised to call with any health related questions or concerns.  Follow up with provider re: post hospital follow up appointment.  Please call the care guide team at [phone] if you need to cancel or reschedule your appointment.   Please call the [place]  National Suicide Prevention Lifeline: (316) 127-0661 or TTY: 305-154-3525 TTY (913)683-1515) to talk to a trained counselor if you are experiencing a Mental Health or Behavioral Health Crisis or need someone to talk to.  Richerd Fish, RN, BSN, CCM [facility], [facility] RN Care Manager Direct Dial: [phone]

## 2024-04-17 NOTE — Transitions of Care (Post Inpatient/ED Visit) (Signed)
 04/17/2024  Name: Laurie Wallace MRN: [id number] DOB: [birth year]  Today's TOC FU Call Status: Today's TOC FU Call Status:: Successful TOC FU Call Completed TOC FU Call Complete Date: 04/17/24 Patient's Name and Date of Birth confirmed.  Transition Care Management Follow-up Telephone Call Date of Discharge: 04/17/24 Discharge Facility: [facility]) Type of Discharge: Inpatient Admission Primary Inpatient Discharge Diagnosis:: S/P insertion of endovascular thoracic aortic stent graft right side How have you been since you were released from the hospital?: Better Any questions or concerns?: No  Items Reviewed: Did you receive and understand the discharge instructions provided?: Yes Medications obtained,verified, and reconciled?: Yes (Medications Reviewed) Any new allergies since your discharge?: No Dietary orders reviewed?: Yes Type of Diet Ordered:: Regular diet, protein Do you have support at home?: Yes People in Home [RPT]: alone, child(ren), adult Name of Support/Comfort Primary Source: Adult son, Laurie Wallace and granddaughter  Medications Reviewed Today: Medications Reviewed Today     Reviewed by [name], RN (Registered Nurse) on 04/17/24 at 1526  Med List Status: <None>   Medication Order Taking? Sig Documenting Provider Last Dose Status Informant  aspirin EC 81 MG tablet [id number] Yes Take 81 mg by mouth daily. Swallow whole. [provider]  Active Self, Pharmacy Records    Discontinued 07/23/20 1124   ELIQUIS  5 MG TABS tablet [id number] Yes Take 1 tablet (5 mg total) by mouth 2 (two) times daily. Katragadda, Sreedhar, MD  Active Self, Pharmacy Records  famotidine  (PEPCID ) 40 MG tablet [id number] Yes TAKE ONE TABLET BY MOUTH DAILY Patel, Rutwik K, MD  Active Self, Pharmacy Records  folic acid  (FOLVITE ) 1 MG tablet [id number] Yes Take 1 tablet (1 mg total) by mouth daily. Jeannetta Lonni ORN, MD  Active Self, Pharmacy Records  levocetirizine  (XYZAL ) 5 MG tablet [id number] Yes Take 1 tablet (5 mg total) by mouth every evening. [name], MD  Active Self, Pharmacy Records  methotrexate  [facility]) 2.5 MG tablet [id number] Yes Take 6 tablets (15 mg total) by mouth once a week. Caution:Chemotherapy. Protect from light. Jeannetta Lonni ORN, MD  Active Self, Pharmacy Records           Med Note Gordo, RICHERD GRADE Kitchens Apr 17, 2024  3:25 PM) Taking on Tuesday            Home Care and Equipment/Supplies: Were Home Health Services Ordered?: No Any new equipment or medical supplies ordered?: No  Functional Questionnaire: Do you need assistance with bathing/showering or dressing?: No Do you need assistance with meal preparation?: No Do you need assistance with eating?: No Do you have difficulty maintaining continence: No Do you need assistance with getting out of bed/getting out of a chair/moving?: No Do you have difficulty managing or taking your medications?: No  Follow up appointments reviewed: PCP Follow-up appointment confirmed?: No (Attempted to Book appointment with PCP today - patient placed on witing list) Do you need transportation to your follow-up appointment?: No Do you understand care options if your condition(s) worsen?: Yes-patient verbalized understanding  SDOH Interventions Today    Flowsheet Row Most Recent Value  SDOH Interventions   Food Insecurity Interventions Intervention Not Indicated  Housing Interventions Intervention Not Indicated  Transportation Interventions Intervention Not Indicated  Utilities Interventions Intervention Not Indicated     Goals Addressed             This Visit's Progress    VBCI Transitions of Care (TOC) Care Plan       Problems:  Recent Hospitalization for treatment of S/P insertion of endovascular thoracic aortic stent graft  Knowledge Deficit Related to post hospital wound care  and No Hospital Follow Up Provider appointment patient currently on waiting  list  Goal:  Over the next 30 days, the patient will not experience hospital readmission  Interventions:  Transitions of Care: Doctor Visits  - discussed the importance of doctor visits Contacted provider for patient needs medication and follow up for complaints of fever, swelling in wound, drainage or pus  Patient Self Care Activities:  Attend all scheduled provider appointments Call pharmacy for medication refills 3-7 days in advance of running out of medications Call provider office for new concerns or questions  Notify RN Care Manager of TOC call rescheduling needs Participate in Transition of Care Program/Attend TOC scheduled calls Take medications as prescribed    Plan:  The care management team will reach out to the patient again over the next 5-10 business days. The patient has been provided with contact information for the care management team and has been advised to call with any health related questions or concerns.         Discussed and offered 30 day TOC program.  Patient has accepted the 30 day weekly follow up program.   The patient has been provided with contact information for the care management team and has been advised to call with any health -related questions or concerns.  The patient verbalized understanding with current plan of care.  The patient is directed to their insurance card regarding availability of benefits coverage.    Richerd Fish, RN, BSN, CCM [facility] RN Care Manager Direct Dial: [phone]

## 2024-04-18 LAB — MULTIPLE MYELOMA PANEL, SERUM
Albumin SerPl Elph-Mcnc: 3.3 g/dL (ref 2.9–4.4)
Albumin/Glob SerPl: 1.4 (ref 0.7–1.7)
Alpha 1: 0.3 g/dL (ref 0.0–0.4)
Alpha2 Glob SerPl Elph-Mcnc: 0.6 g/dL (ref 0.4–1.0)
B-Globulin SerPl Elph-Mcnc: 0.7 g/dL (ref 0.7–1.3)
Gamma Glob SerPl Elph-Mcnc: 0.8 g/dL (ref 0.4–1.8)
Globulin, Total: 2.4 g/dL (ref 2.2–3.9)
IgA: 192 mg/dL (ref 64–422)
IgG (Immunoglobin G), Serum: 753 mg/dL (ref 586–1602)
IgM (Immunoglobulin M), Srm: 276 mg/dL — ABNORMAL HIGH (ref 26–217)
Total Protein ELP: 5.7 g/dL — ABNORMAL LOW (ref 6.0–8.5)

## 2024-04-18 LAB — FLOW CYTOMETRY

## 2024-04-18 NOTE — Discharge Summary (Signed)
 [facility] Discharge Summary   Laurie Wallace May 14, 1946 78 y.o. female  MRN: [id number]  Admission Date: 04/12/2024  Discharge Date: 04/14/2024  Physician: Dr. Debby Robertson  Admission Diagnosis: Aortic thrombus [facility]) [I74.10] S/P insertion of endovascular thoracic aortic stent graft [Z95.828]  Discharge Day services:    PT/OT   Hospital Course:  The patient was admitted to the hospital and taken to the operating room on 04/12/2024 and underwent: 1) ultrasound guided right common femoral artery access and large bore percutaneous closure (CPT [phone], [id number]) 2) intravascular ultrasound of ascending aorta, transverse aorta, descending thoracic aorta, abdominal aorta (CPT [phone]) 3) thoracic endovascular aortic repair Bluford Zenith 24 x (CPT [phone]) 4) arch, thoracic, abdominal aortogram and pelvic angiogram (CPT 36221, [id number], 24374, 24263)  The pt tolerated the procedure well and was transported to the PACU in good condition. Oncology consulted for evaluation of patients chronic leukocytosis. Concerning for CML vs CMML.   POD#1, patient did well overnight. Mild thoracic back pain and pain at groin access site. Morning labs showed critical hemoglobin of 6.8. Asymptomatic. 1 Unit of PRBC was ordered for transfusion.   The remainder of the hospital course consisted of increasing mobilization, voiding without difficulty, and increasing intake of solids without difficulty.  POD#2, she remained stable for discharge. Right femoral access site without hematoma. Extremities well perfused with palpable pulses. Chronic leukocytosis, thrombocytosis and anemia all concerning for CML or CMML. Outpatient follow up arranged with Heme/ Oncology. Anticipate that she will need iliac crest bone marrow biopsy in near future. She otherwise remained stable for discharge home. Her home medications she will resume as prescribed. She will follow up with Dr. Robertson in 1 month with CTA chest/  abdomen.   CBC    Component Value Date/Time   WBC 23.2 (H) 04/13/2024 1400   RBC 2.67 (L) 04/13/2024 1400   HGB 8.6 (L) 04/13/2024 1400   HGB 10.8 (L) 10/13/2023 1126   HCT 26.1 (L) 04/13/2024 1400   HCT 33.1 (L) 10/13/2023 1126   PLT 324 04/13/2024 1400   PLT Comment 10/13/2023 1126   MCV 97.8 04/13/2024 1400   MCV 102 (H) 10/13/2023 1126   MCH 32.2 04/13/2024 1400   MCHC 33.0 04/13/2024 1400   RDW 22.5 (H) 04/13/2024 1400   RDW 18.6 (H) 10/13/2023 1126   LYMPHSABS 8.4 (H) 04/11/2024 1400   LYMPHSABS 3.1 10/13/2023 1126   MONOABS 8.4 (H) 04/11/2024 1400   EOSABS 2.8 (H) 04/11/2024 1400   EOSABS 0.5 (H) 10/13/2023 1126   BASOSABS 0.3 (H) 04/11/2024 1400   BASOSABS 0.1 10/13/2023 1126    BMET    Component Value Date/Time   NA 140 04/14/2024 0344   NA 140 10/13/2023 1126   K 3.9 04/14/2024 0344   CL 105 04/14/2024 0344   CO2 24 04/14/2024 0344   GLUCOSE 92 04/14/2024 0344   BUN 10 04/14/2024 0344   BUN 9 10/13/2023 1126   CREATININE 1.37 (H) 04/14/2024 0344   CREATININE 1.15 (H) 03/27/2024 1205   CALCIUM 8.7 (L) 04/14/2024 0344   GFRNONAA 40 (L) 04/14/2024 0344   GFRAA 88 09/28/2014 0947       Discharge Instructions     ABDOMINAL PROCEDURE/ANEURYSM REPAIR/AORTO-BIFEMORAL BYPASS:  Call MD for increased abdominal pain; cramping diarrhea; nausea/vomiting   Complete by: As directed    Call MD for:  redness, tenderness, or signs of infection (pain, swelling, bleeding, redness, odor or green/yellow discharge around incision site)   Complete by: As directed  Call MD for:  severe or increased pain, loss or decreased feeling  in affected limb(s)   Complete by: As directed    Call MD for:  temperature >100.5   Complete by: As directed    Discharge instructions   Complete by: As directed    Can resume Eliquis  04/14/24   Driving Restrictions   Complete by: As directed    No driving for 2 days   Increase activity slowly   Complete by: As directed    Walk with  assistance use walker or cane as needed   Lifting restrictions   Complete by: As directed    No lifting for 2 weeks   Resume previous diet   Complete by: As directed    may wash over wound with mild soap and water   Complete by: As directed        Discharge Diagnosis:  Aortic thrombus (HCC) [I74.10] S/P insertion of endovascular thoracic aortic stent graft [Z95.828]  Secondary Diagnosis: Patient Active Problem List   Diagnosis Date Noted   MPN (myeloproliferative neoplasm) (HCC) 04/13/2024   Monocytosis 04/13/2024   Leukocytosis 04/13/2024   S/P insertion of endovascular thoracic aortic stent graft 04/12/2024   Aortic thrombus (HCC) 04/12/2024   COVID-19 11/08/2023   Acute non-recurrent maxillary sinusitis 11/08/2023   Lupus anticoagulant positive 10/13/2023   Dizziness 10/13/2023   Pulmonary embolism (HCC) 09/29/2023   Pulmonary nodule 1 cm or greater in diameter 06/02/2023   Community acquired pneumonia of left lower lobe of lung 06/02/2023   Microcytic anemia 02/17/2023   Hospital discharge follow-up 01/18/2023   Gastroesophageal reflux disease 01/18/2023   Gait disturbance 01/18/2023   Nonintractable episodic headache 01/18/2023   Infectious colitis 01/10/2023   Hypokalemia 01/10/2023   Thrombocytosis 01/10/2023   Bilateral shoulder pain 12/22/2022   Localized swelling of right forearm 09/21/2022   Subcutaneous cyst 07/20/2022   Allergic sinusitis 01/14/2022   Encounter for general adult medical examination with abnormal findings 07/17/2021   Age-related osteoporosis without current pathological fracture 07/17/2021   Muscle cramps 02/28/2021   High risk medication use 12/26/2020   Seropositive rheumatoid arthritis (HCC) 10/25/2020   Moderate protein-calorie malnutrition 10/25/2020   Cervical spine pain 10/23/2020   Lumbar pain 10/23/2020   Other intervertebral disc displacement, lumbar region 04/07/2010   Past Medical History:  Diagnosis Date   Allergy     Anemia    Iron  Deficiency   Arthritis    Rheumatoid Arthritis   Family history of adverse reaction to anesthesia    Sister has N/V   GERD (gastroesophageal reflux disease)    Peripheral vascular disease    Pneumonia      Allergies as of 04/14/2024   No Known Allergies      Medication List     TAKE these medications    aspirin EC 81 MG tablet Take 81 mg by mouth daily. Swallow whole.   Eliquis  5 MG Tabs tablet Generic drug: apixaban  Take 1 tablet (5 mg total) by mouth 2 (two) times daily.   famotidine  40 MG tablet Commonly known as: PEPCID  TAKE ONE TABLET BY MOUTH DAILY   folic acid  1 MG tablet Commonly known as: FOLVITE  Take 1 tablet (1 mg total) by mouth daily.   levocetirizine 5 MG tablet Commonly known as: XYZAL  Take 1 tablet (5 mg total) by mouth every evening.   methotrexate  2.5 MG tablet Commonly known as: RHEUMATREX Take 6 tablets (15 mg total) by mouth once a week. Caution:Chemotherapy. Protect from light.  Discharge Instructions:   [facility]  Discharge Instructions Endovascular Aortic Aneurysm Repair  Please refer to the following instructions for your post-procedure care. Your surgeon or Physician Assistant will discuss any changes with you.  Activity  You are encouraged to walk as much as you can. You can slowly return to normal activities but must avoid strenuous activity and heavy lifting until your doctor tells you it's OK. Avoid activities such as vacuuming or swinging a gold club. It is normal to feel tired for several weeks after your surgery. Do not drive until your doctor gives the OK and you are no longer taking prescription pain medications. It is also normal to have difficulty with sleep habits, eating, and bowel movements after surgery. These will go away with time.  Bathing/Showering  You may shower after you go home. If you have an incision, do not soak in a bathtub, hot tub, or swim until  the incision heals completely.  Incision Care  Shower every day. Clean your incision with mild soap and water. Pat the area dry with a clean towel. You do not need a bandage unless otherwise instructed. Do not apply any ointments or creams to your incision. If you clothing is irritating, you may cover your incision with a dry gauze pad.  Diet  Resume your normal diet. There are no special food restrictions following this procedure. A low fat/low cholesterol diet is recommended for all patients with vascular disease. In order to heal from your surgery, it is CRITICAL to get adequate nutrition. Your body requires vitamins, minerals, and protein. Vegetables are the best source of vitamins and minerals. Vegetables also provide the perfect balance of protein. Processed food has little nutritional value, so try to avoid this.  Medications  Resume taking all of your medications unless your doctor or Physician Assistnat tells you not to. If your incision is causing pain, you may take over-the-counter pain relievers such as acetaminophen  (Tylenol ). If you were prescribed a stronger pain medication, please be aware these medications can cause nausea and constipation. Prevent nausea by taking the medication with a snack or meal. Avoid constipation by drinking plenty of fluids and eating foods with a high amount of fiber, such as fruits, vegetables, and grains. Do not take Tylenol  if you are taking prescription pain medications.   Follow up  Our office will schedule a follow-up appointment with a C.T. scan 3-4 weeks after your surgery.  Please call us  immediately for any of the following conditions  Severe or worsening pain in your legs or feet or in your abdomen back or chest. Increased pain, redness, drainage (pus) from your incision sit. Increased abdominal pain, bloating, nausea, vomiting or persistent diarrhea. Fever of 101 degrees or higher. Swelling in your leg (s),  Reduce your risk of  vascular disease  Stop smoking. If you would like help call [facility] at 1-800-QUIT-NOW (8151561459) or [place] at [phone]. Manage your cholesterol Maintain a desired weight Control your diabetes Keep your blood pressure down  If you have questions, please call the office at [phone].    Prescriptions given: none  Disposition: home  Patient's condition: is Fair  Follow up: 1. Dr. Magda in 4 weeks with CTA protocol   Teretha Damme, PA-C [facility] [phone] 04/18/2024  8:18 AM   - For VQI Registry use - Post-op:  Time to Extubation: [X]  In OR, [ ]  < 12 hrs, [ ]  12-24 hrs, [ ]  >=24 hrs Vasopressors Req.  Post-op: No MI: No., [ ]  Troponin only, [ ]  EKG or Clinical New Arrhythmia: No CHF: No ICU Stay: no days Transfusion: Yes     If yes, 1 units given  Complications: Resp failure: No., [ ]  Pneumonia, [ ]  Ventilator Chg in renal function: No., [ ]  Inc. Cr > 0.5, [ ]  Temp. Dialysis,  [ ]  Permanent dialysis Leg ischemia: No., no Surgery needed, [ ]  Yes, Surgery needed,  [ ]  Amputation Bowel ischemia: No., [ ]  Medical Rx, [ ]  Surgical Rx Wound complication: No., [ ]  Superficial separation/infection, [ ]  Return to OR Return to OR: No  Return to OR for bleeding: No Stroke: No., [ ]  Minor, [ ]  Major  Discharge medications: Statin use:  No  ASA use:  Yes  Plavix use:  No  Beta blocker use:  No  ARB use:  No ACEI use:  No CCB use:  No

## 2024-04-19 LAB — BCR-ABL1, CML/ALL, PCR, QUANT
E1A2 Transcript: <0.0032 %
Interpretation (BCRAL):: NEGATIVE
b2a2 transcript: <0.0032 %
b3a2 transcript: <0.0032 %

## 2024-04-22 LAB — NGS JAK2 E12-15/CALR/MPL

## 2024-04-24 ENCOUNTER — Inpatient Hospital Stay: Admitting: Oncology

## 2024-04-24 ENCOUNTER — Inpatient Hospital Stay

## 2024-04-26 ENCOUNTER — Telehealth: Payer: Self-pay

## 2024-04-27 ENCOUNTER — Other Ambulatory Visit: Payer: Self-pay

## 2024-04-27 ENCOUNTER — Encounter (HOSPITAL_COMMUNITY): Payer: Self-pay | Admitting: Emergency Medicine

## 2024-04-27 ENCOUNTER — Emergency Department (HOSPITAL_COMMUNITY)
Admission: EM | Admit: 2024-04-27 | Discharge: 2024-04-27 | Disposition: A | Discharge status: Home / Self Care | Attending: Emergency Medicine | Admitting: Emergency Medicine

## 2024-04-27 DIAGNOSIS — Z7982 Long term (current) use of aspirin: Secondary | ICD-10-CM | POA: Insufficient documentation

## 2024-04-27 DIAGNOSIS — Z7901 Long term (current) use of anticoagulants: Secondary | ICD-10-CM | POA: Insufficient documentation

## 2024-04-27 DIAGNOSIS — D75839 Thrombocytosis, unspecified: Secondary | ICD-10-CM | POA: Insufficient documentation

## 2024-04-27 DIAGNOSIS — D72829 Elevated white blood cell count, unspecified: Secondary | ICD-10-CM | POA: Diagnosis not present

## 2024-04-27 DIAGNOSIS — R531 Weakness: Secondary | ICD-10-CM | POA: Diagnosis not present

## 2024-04-27 DIAGNOSIS — M25562 Pain in left knee: Secondary | ICD-10-CM | POA: Diagnosis not present

## 2024-04-27 LAB — RESP PANEL BY RT-PCR (RSV, FLU A&B, COVID)  RVPGX2
Influenza A by PCR: NEGATIVE
Influenza B by PCR: NEGATIVE
Resp Syncytial Virus by PCR: NEGATIVE
SARS Coronavirus 2 by RT PCR: NEGATIVE

## 2024-04-27 LAB — CBC WITH DIFFERENTIAL/PLATELET
Abs Immature Granulocytes: 12.1 K/uL — ABNORMAL HIGH (ref 0.00–0.07)
Band Neutrophils: 9 %
Basophils Absolute: 0 K/uL (ref 0.0–0.1)
Basophils Relative: 0 %
Blasts: 4 %
Eosinophils Absolute: 1.2 K/uL — ABNORMAL HIGH (ref 0.0–0.5)
Eosinophils Relative: 2 %
HCT: 31.8 % — ABNORMAL LOW (ref 36.0–46.0)
Hemoglobin: 9.9 g/dL — ABNORMAL LOW (ref 12.0–15.0)
Lymphocytes Relative: 19 %
Lymphs Abs: 11.5 K/uL — ABNORMAL HIGH (ref 0.7–4.0)
MCH: 31.2 pg (ref 26.0–34.0)
MCHC: 31.1 g/dL (ref 30.0–36.0)
MCV: 100.3 fL — ABNORMAL HIGH (ref 80.0–100.0)
Metamyelocytes Relative: 12 %
Monocytes Absolute: 3 K/uL — ABNORMAL HIGH (ref 0.1–1.0)
Monocytes Relative: 5 %
Myelocytes: 7 %
Neutro Abs: 30.3 K/uL — ABNORMAL HIGH (ref 1.7–7.7)
Neutrophils Relative %: 41 %
Platelets: 829 K/uL — ABNORMAL HIGH (ref 150–400)
Promyelocytes Relative: 1 %
RBC: 3.17 MIL/uL — ABNORMAL LOW (ref 3.87–5.11)
RDW: 21 % — ABNORMAL HIGH (ref 11.5–15.5)
WBC: 60.5 K/uL (ref 4.0–10.5)
nRBC: 0.1 % (ref 0.0–0.2)

## 2024-04-27 LAB — COMPREHENSIVE METABOLIC PANEL WITH GFR
ALT: 11 U/L (ref 0–44)
AST: 28 U/L (ref 15–41)
Albumin: 4.5 g/dL (ref 3.5–5.0)
Alkaline Phosphatase: 123 U/L (ref 38–126)
Anion gap: 16 — ABNORMAL HIGH (ref 5–15)
BUN: 11 mg/dL (ref 8–23)
CO2: 23 mmol/L (ref 22–32)
Calcium: 9.5 mg/dL (ref 8.9–10.3)
Chloride: 100 mmol/L (ref 98–111)
Creatinine, Ser: 1.08 mg/dL — ABNORMAL HIGH (ref 0.44–1.00)
GFR, Estimated: 52 mL/min — ABNORMAL LOW (ref >60)
Glucose, Bld: 93 mg/dL (ref 70–99)
Potassium: 4.2 mmol/L (ref 3.5–5.1)
Sodium: 139 mmol/L (ref 135–145)
Total Bilirubin: 0.5 mg/dL (ref 0.0–1.2)
Total Protein: 7.7 g/dL (ref 6.5–8.1)

## 2024-04-27 LAB — CK: Total CK: 46 U/L (ref 38–234)

## 2024-04-27 MED ORDER — ACETAMINOPHEN 500 MG PO TABS
1000.0000 mg | ORAL_TABLET | Freq: Once | ORAL | Status: AC
  Administered 2024-04-27: 1000 mg via ORAL
  Filled 2024-04-27: qty 2

## 2024-04-27 NOTE — Discharge Instructions (Addendum)
 To take care of you today.  You are seen for generalized aching.  You can take over-the-counter Tylenol  as directed on packaging as needed for discomfort.  On your labs your white blood cell count was very high and your platelet count was also very high.  You need to follow-up with the oncologist, I spoke with Dr. Hillary today while you were in the ER.  Her office will reach out to you to schedule close follow-up appointment within the next week.  If you do not hear from them by early next week please call the office.

## 2024-04-27 NOTE — ED Provider Notes (Signed)
 [place] EMERGENCY DEPARTMENT AT [facility] Provider Note   CSN: [id number] Arrival date & time: 04/27/24  1222     Patient presents with: Generalized Body Aches   Laurie Wallace is a 78 y.o. female.  She presents to the ER today for evaluation of widespread body pain since yesterday.  Denies fever or chills, denies nausea or vomiting, no cough.  She notes that she had surgery on 04/12/2024 for thoracic endovascular aortic repair and has been doing well since surgery.    HPI     Prior to Admission medications   Medication Sig Start Date End Date Taking? Authorizing Provider  aspirin EC 81 MG tablet Take 81 mg by mouth daily. Swallow whole.    [provider]  ELIQUIS  5 MG TABS tablet Take 1 tablet (5 mg total) by mouth 2 (two) times daily. 02/08/24   Rogers Hai, MD  famotidine  (PEPCID ) 40 MG tablet TAKE ONE TABLET BY MOUTH DAILY 04/10/24   Patel, Rutwik K, MD  folic acid  (FOLVITE ) 1 MG tablet Take 1 tablet (1 mg total) by mouth daily. 12/23/23   Rice, Lonni ORN, MD  levocetirizine (XYZAL ) 5 MG tablet Take 1 tablet (5 mg total) by mouth every evening. 10/13/23   [name], MD  methotrexate  (RHEUMATREX) 2.5 MG tablet Take 6 tablets (15 mg total) by mouth once a week. Caution:Chemotherapy. Protect from light. 03/27/24   Rice, Lonni ORN, MD  brompheniramine (VAZOL) 2 MG/5ML LIQD Take 2 mg by mouth 2 (two) times daily.  07/23/20  [provider]    Allergies: Patient has no known allergies.    Review of Systems  Updated Vital Signs BP (!) 121/56 (BP Location: Right Arm)   Pulse 99   Temp 98.3 F (36.8 C) (Oral)   Resp 16   Ht 5' 6 (1.676 m)   Wt 55 kg   SpO2 99%   BMI 19.57 kg/m   Physical Exam Vitals and nursing note reviewed.  Constitutional:      General: She is not in acute distress.    Appearance: She is well-developed.  HENT:     Head: Normocephalic and atraumatic.     Mouth/Throat:     Mouth: Mucous  membranes are moist.  Eyes:     Conjunctiva/sclera: Conjunctivae normal.  Cardiovascular:     Rate and Rhythm: Normal rate and regular rhythm.     Heart sounds: No murmur heard. Pulmonary:     Effort: Pulmonary effort is normal. No respiratory distress.     Breath sounds: Normal breath sounds.  Abdominal:     Palpations: Abdomen is soft.     Tenderness: There is no abdominal tenderness.  Musculoskeletal:        General: No swelling.     Cervical back: Neck supple.  Skin:    General: Skin is warm and dry.     Capillary Refill: Capillary refill takes less than 2 seconds.  Neurological:     General: No focal deficit present.     Mental Status: She is alert and oriented to person, place, and time.  Psychiatric:        Mood and Affect: Mood normal.     (all labs ordered are listed, but only abnormal results are displayed) Labs Reviewed - No data to display  EKG: None  Radiology: No results found.   Procedures   Medications Ordered in the ED - No data to display  Medical Decision Making This patient presents to the ED for concern of generalized bodyaches, this involves an extensive number of treatment options, and is a complaint that carries with it a high risk of complications and morbidity.  The differential diagnosis includes myalgia, viral illness   Co morbidities that complicate the patient evaluation :   Thoracic aortic repair   Additional history obtained:  Additional history obtained from EMR External records from outside source obtained and reviewed including higher notes and labs   Lab Tests:  I Ordered, and personally interpreted labs.  The pertinent results include: White blood cell count is 60.5, platelets are 829, hemoglobin 9.9    Consultations Obtained:  I requested consultation with the on-call oncology including Dr. Davonna,  and discussed lab and imaging findings as well as pertinent plan - they recommend:  Patient follow-up with oncology.  Dr.  Davonna reports her office will follow-up with patient.   Problem List / ED Course / Critical interventions / Medication management  Reports that generalized aching all over for the past couple of days, denies weakness, denies fevers or chills, no nausea or vomiting. Patient has extremely high white blood cell count, it has been going up over the past couple of months, patient has seen Dr. Rogers in the past but is not currently following up with him.  She states she was scheduled to follow-up in October for repeat labs.  She has a very high white blood cell count today with no signs of infection, negative for COVID flu and RSV, CK is normal.  This could represent bone pain from acute leukemia so I consulted oncology, given that her white blood cell count has been increasing over months they feel this is more likely a subacute or chronic problem and that she can be safely followed up in the outpatient setting closely.  Dr.Kandala is going to see her in the office.  It was agreed to plan of care and discharge and was given strict return precautions. I ordered medication including Tylenol  for muscle soreness Reevaluation of the patient after these medicines showed that the patient improved I have reviewed the patients home medicines and have made adjustments as needed   Social Determinants of Health:  Patient lives at home.    Amount and/or Complexity of Data Reviewed Labs: ordered.  Risk OTC drugs.        Final diagnoses:  None    ED Discharge Orders     None          Suellen Sherran DELENA DEVONNA 04/28/24 1309    Towana Ozell BROCKS, MD 05/02/24 [phone]

## 2024-04-27 NOTE — ED Triage Notes (Signed)
 Pt c/o of soreness to her whole body that started Wednesday. Pt denies any other symptoms. States she had a stent placed in her abdomen last Friday for blood clots.

## 2024-04-28 ENCOUNTER — Telehealth: Payer: Self-pay

## 2024-04-28 ENCOUNTER — Ambulatory Visit: Payer: Self-pay

## 2024-04-28 ENCOUNTER — Other Ambulatory Visit: Payer: Self-pay

## 2024-04-28 ENCOUNTER — Emergency Department (HOSPITAL_COMMUNITY)

## 2024-04-28 ENCOUNTER — Encounter (HOSPITAL_COMMUNITY): Payer: Self-pay

## 2024-04-28 ENCOUNTER — Inpatient Hospital Stay (HOSPITAL_COMMUNITY)
Admission: EM | Admit: 2024-04-28 | Discharge: 2024-05-09 | DRG: 841 | Disposition: A | Discharge status: Home Health | Attending: Internal Medicine | Admitting: Internal Medicine

## 2024-04-28 DIAGNOSIS — R64 Cachexia: Secondary | ICD-10-CM | POA: Diagnosis present

## 2024-04-28 DIAGNOSIS — Z95828 Presence of other vascular implants and grafts: Secondary | ICD-10-CM

## 2024-04-28 DIAGNOSIS — Z825 Family history of asthma and other chronic lower respiratory diseases: Secondary | ICD-10-CM

## 2024-04-28 DIAGNOSIS — G893 Neoplasm related pain (acute) (chronic): Secondary | ICD-10-CM | POA: Diagnosis present

## 2024-04-28 DIAGNOSIS — Z86711 Personal history of pulmonary embolism: Secondary | ICD-10-CM

## 2024-04-28 DIAGNOSIS — R509 Fever, unspecified: Secondary | ICD-10-CM | POA: Diagnosis present

## 2024-04-28 DIAGNOSIS — Z79631 Long term (current) use of antimetabolite agent: Secondary | ICD-10-CM

## 2024-04-28 DIAGNOSIS — R262 Difficulty in walking, not elsewhere classified: Secondary | ICD-10-CM

## 2024-04-28 DIAGNOSIS — C9310 Chronic myelomonocytic leukemia not having achieved remission: Secondary | ICD-10-CM | POA: Diagnosis not present

## 2024-04-28 DIAGNOSIS — Z8679 Personal history of other diseases of the circulatory system: Secondary | ICD-10-CM

## 2024-04-28 DIAGNOSIS — Z7982 Long term (current) use of aspirin: Secondary | ICD-10-CM

## 2024-04-28 DIAGNOSIS — M059 Rheumatoid arthritis with rheumatoid factor, unspecified: Secondary | ICD-10-CM | POA: Diagnosis present

## 2024-04-28 DIAGNOSIS — Z5971 Insufficient health insurance coverage: Secondary | ICD-10-CM

## 2024-04-28 DIAGNOSIS — Z79899 Other long term (current) drug therapy: Secondary | ICD-10-CM

## 2024-04-28 DIAGNOSIS — Z7901 Long term (current) use of anticoagulants: Secondary | ICD-10-CM

## 2024-04-28 DIAGNOSIS — Z8701 Personal history of pneumonia (recurrent): Secondary | ICD-10-CM

## 2024-04-28 DIAGNOSIS — Z681 Body mass index (BMI) 19 or less, adult: Secondary | ICD-10-CM

## 2024-04-28 DIAGNOSIS — D471 Chronic myeloproliferative disease: Secondary | ICD-10-CM | POA: Diagnosis present

## 2024-04-28 DIAGNOSIS — I739 Peripheral vascular disease, unspecified: Secondary | ICD-10-CM | POA: Diagnosis present

## 2024-04-28 DIAGNOSIS — K219 Gastro-esophageal reflux disease without esophagitis: Secondary | ICD-10-CM | POA: Diagnosis present

## 2024-04-28 DIAGNOSIS — R531 Weakness: Secondary | ICD-10-CM | POA: Diagnosis not present

## 2024-04-28 DIAGNOSIS — Z90710 Acquired absence of both cervix and uterus: Secondary | ICD-10-CM

## 2024-04-28 DIAGNOSIS — E790 Hyperuricemia without signs of inflammatory arthritis and tophaceous disease: Secondary | ICD-10-CM | POA: Diagnosis not present

## 2024-04-28 DIAGNOSIS — D72829 Elevated white blood cell count, unspecified: Secondary | ICD-10-CM | POA: Diagnosis not present

## 2024-04-28 DIAGNOSIS — J449 Chronic obstructive pulmonary disease, unspecified: Secondary | ICD-10-CM | POA: Diagnosis present

## 2024-04-28 DIAGNOSIS — R079 Chest pain, unspecified: Secondary | ICD-10-CM | POA: Diagnosis not present

## 2024-04-28 DIAGNOSIS — D75839 Thrombocytosis, unspecified: Secondary | ICD-10-CM | POA: Diagnosis present

## 2024-04-28 DIAGNOSIS — Z86718 Personal history of other venous thrombosis and embolism: Secondary | ICD-10-CM

## 2024-04-28 DIAGNOSIS — M25562 Pain in left knee: Secondary | ICD-10-CM | POA: Diagnosis not present

## 2024-04-28 DIAGNOSIS — E860 Dehydration: Secondary | ICD-10-CM | POA: Diagnosis present

## 2024-04-28 DIAGNOSIS — I2699 Other pulmonary embolism without acute cor pulmonale: Secondary | ICD-10-CM | POA: Diagnosis present

## 2024-04-28 DIAGNOSIS — Z8249 Family history of ischemic heart disease and other diseases of the circulatory system: Secondary | ICD-10-CM

## 2024-04-28 DIAGNOSIS — D539 Nutritional anemia, unspecified: Secondary | ICD-10-CM | POA: Diagnosis present

## 2024-04-28 LAB — TROPONIN T, HIGH SENSITIVITY: Troponin T High Sensitivity: <15 ng/L (ref 0–19)

## 2024-04-28 LAB — PROTIME-INR
INR: 2.1 — ABNORMAL HIGH (ref 0.8–1.2)
Prothrombin Time: 24.2 s — ABNORMAL HIGH (ref 11.4–15.2)

## 2024-04-28 LAB — COMPREHENSIVE METABOLIC PANEL WITH GFR
ALT: 9 U/L (ref 0–44)
AST: 30 U/L (ref 15–41)
Albumin: 4.2 g/dL (ref 3.5–5.0)
Alkaline Phosphatase: 112 U/L (ref 38–126)
Anion gap: 16 — ABNORMAL HIGH (ref 5–15)
BUN: 11 mg/dL (ref 8–23)
CO2: 20 mmol/L — ABNORMAL LOW (ref 22–32)
Calcium: 9.1 mg/dL (ref 8.9–10.3)
Chloride: 101 mmol/L (ref 98–111)
Creatinine, Ser: 1.12 mg/dL — ABNORMAL HIGH (ref 0.44–1.00)
GFR, Estimated: 50 mL/min — ABNORMAL LOW (ref >60)
Glucose, Bld: 93 mg/dL (ref 70–99)
Potassium: 4.1 mmol/L (ref 3.5–5.1)
Sodium: 137 mmol/L (ref 135–145)
Total Bilirubin: 0.5 mg/dL (ref 0.0–1.2)
Total Protein: 7.4 g/dL (ref 6.5–8.1)

## 2024-04-28 LAB — CBC WITH DIFFERENTIAL/PLATELET
Abs Immature Granulocytes: 17.47 K/uL — ABNORMAL HIGH (ref 0.00–0.07)
Basophils Absolute: 0.4 K/uL — ABNORMAL HIGH (ref 0.0–0.1)
Basophils Relative: 1 %
Eosinophils Absolute: 1.1 K/uL — ABNORMAL HIGH (ref 0.0–0.5)
Eosinophils Relative: 2 %
HCT: 26.3 % — ABNORMAL LOW (ref 36.0–46.0)
Hemoglobin: 8.3 g/dL — ABNORMAL LOW (ref 12.0–15.0)
Immature Granulocytes: 30 %
Lymphocytes Relative: 10 %
Lymphs Abs: 5.9 K/uL — ABNORMAL HIGH (ref 0.7–4.0)
MCH: 32 pg (ref 26.0–34.0)
MCHC: 31.6 g/dL (ref 30.0–36.0)
MCV: 101.5 fL — ABNORMAL HIGH (ref 80.0–100.0)
Monocytes Absolute: 6.7 K/uL — ABNORMAL HIGH (ref 0.1–1.0)
Monocytes Relative: 12 %
Neutro Abs: 26.8 K/uL — ABNORMAL HIGH (ref 1.7–7.7)
Neutrophils Relative %: 45 %
Platelets: 447 K/uL — ABNORMAL HIGH (ref 150–400)
RBC: 2.59 MIL/uL — ABNORMAL LOW (ref 3.87–5.11)
RDW: 19.7 % — ABNORMAL HIGH (ref 11.5–15.5)
WBC: 58.5 K/uL (ref 4.0–10.5)
nRBC: 0.1 % (ref 0.0–0.2)

## 2024-04-28 LAB — PATHOLOGIST SMEAR REVIEW

## 2024-04-28 LAB — PHOSPHORUS: Phosphorus: 3.7 mg/dL (ref 2.5–4.6)

## 2024-04-28 MED ORDER — MORPHINE SULFATE (PF) 2 MG/ML IV SOLN: 2.0000 mg | INTRAVENOUS | Status: DC | PRN

## 2024-04-28 MED ORDER — HYDRALAZINE HCL 20 MG/ML IJ SOLN: 5.0000 mg | INTRAMUSCULAR | Status: DC | PRN

## 2024-04-28 MED ORDER — LACTATED RINGERS IV BOLUS
1000.0000 mL | Freq: Once | INTRAVENOUS | Status: AC
  Administered 2024-04-28: 1000 mL via INTRAVENOUS

## 2024-04-28 MED ORDER — OXYCODONE HCL 5 MG PO TABS
5.0000 mg | ORAL_TABLET | ORAL | Status: DC | PRN
  Administered 2024-04-28 – 2024-05-01 (×7): 5 mg via ORAL
  Filled 2024-04-28 (×8): qty 1

## 2024-04-28 MED ORDER — ASPIRIN 81 MG PO TBEC
81.0000 mg | DELAYED_RELEASE_TABLET | Freq: Every day | ORAL | Status: DC
  Administered 2024-04-29 – 2024-05-09 (×10): 81 mg via ORAL
  Filled 2024-04-28 (×11): qty 1

## 2024-04-28 MED ORDER — ONDANSETRON 4 MG PO TBDP
4.0000 mg | ORAL_TABLET | Freq: Once | ORAL | Status: AC
Start: 2024-04-28 — End: 2024-04-28
  Administered 2024-04-28: 4 mg via ORAL
  Filled 2024-04-28: qty 1

## 2024-04-28 MED ORDER — APIXABAN 5 MG PO TABS
5.0000 mg | ORAL_TABLET | Freq: Two times a day (BID) | ORAL | Status: DC
  Administered 2024-04-28 – 2024-05-09 (×20): 5 mg via ORAL
  Filled 2024-04-28 (×22): qty 1

## 2024-04-28 MED ORDER — LORATADINE 10 MG PO TABS
5.0000 mg | ORAL_TABLET | Freq: Every day | ORAL | Status: DC
  Administered 2024-04-29 – 2024-05-09 (×10): 5 mg via ORAL
  Filled 2024-04-28 (×11): qty 1

## 2024-04-28 MED ORDER — SODIUM CHLORIDE 0.9 % IV SOLN: INTRAVENOUS | Status: AC

## 2024-04-28 MED ORDER — ACETAMINOPHEN 325 MG PO TABS
650.0000 mg | ORAL_TABLET | Freq: Four times a day (QID) | ORAL | Status: DC | PRN
Start: 2024-04-28 — End: 2024-05-02
  Administered 2024-05-01: 650 mg via ORAL
  Filled 2024-04-28: qty 2

## 2024-04-28 MED ORDER — FAMOTIDINE 20 MG PO TABS
20.0000 mg | ORAL_TABLET | Freq: Every day | ORAL | Status: DC
  Administered 2024-04-29 – 2024-05-09 (×10): 20 mg via ORAL
  Filled 2024-04-28 (×11): qty 1

## 2024-04-28 MED ORDER — ACETAMINOPHEN 650 MG RE SUPP: 650.0000 mg | Freq: Four times a day (QID) | RECTAL | Status: DC | PRN

## 2024-04-28 MED ORDER — MORPHINE SULFATE (PF) 4 MG/ML IV SOLN
4.0000 mg | Freq: Once | INTRAVENOUS | Status: AC
  Administered 2024-04-28: 4 mg via INTRAVENOUS
  Filled 2024-04-28: qty 1

## 2024-04-28 MED ORDER — FOLIC ACID 1 MG PO TABS
1.0000 mg | ORAL_TABLET | Freq: Every day | ORAL | Status: DC
  Administered 2024-04-29 – 2024-05-09 (×10): 1 mg via ORAL
  Filled 2024-04-28 (×11): qty 1

## 2024-04-28 MED ORDER — LEVOCETIRIZINE DIHYDROCHLORIDE 5 MG PO TABS: 5.0000 mg | ORAL_TABLET | Freq: Every evening | ORAL | Status: DC

## 2024-04-28 MED ORDER — ONDANSETRON HCL 4 MG/2ML IJ SOLN
4.0000 mg | Freq: Once | INTRAMUSCULAR | Status: DC
  Filled 2024-04-28: qty 2

## 2024-04-28 NOTE — ED Notes (Signed)
 Pt was given 2 cups of water per her request

## 2024-04-28 NOTE — Transitions of Care (Post Inpatient/ED Visit) (Signed)
 Care Management  Transitions of Care Program Transitions of Care Post-discharge week 2  04/28/2024 Name: [name] MRN: [id number] DOB: 1945-09-06  Subjective: Laurie Wallace is a 78 y.o. year old female who is a primary care patient of [name], MD. The Care Management team was unable to reach the patient by phone to assess and address transitions of care needs. 2nd call attempt, unable to leave a voicemail message.  Plan: Additional outreach attempts will be made to reach the patient enrolled in the [facility] Program (Post Inpatient/ED Visit).  Richerd Fish, RN, BSN, CCM [facility] Management Coordinator Direct Dial: [phone]

## 2024-04-28 NOTE — ED Notes (Signed)
 Date and time results received: 04/28/24 1701  Test: White Blood Cell Critical Value: 58.5  Name of Provider Notified: Franklyn Gills, MD

## 2024-04-28 NOTE — Telephone Encounter (Signed)
 FYI Only or Action Required?: FYI only for provider.  Patient was last seen in primary care on 11/08/2023 by Melvenia Manus BRAVO, MD.  Called Nurse Triage reporting Generalized Body Aches and Appointment.  Symptoms began yesterday.  Interventions attempted: Other: Seen in ED on 10/23.  Symptoms are: unchanged.  Triage Disposition: See PCP Within 2 Weeks  Patient/caregiver understands and will follow disposition?: Yes  **Post hospitalization appt. Scheduled for 11/5**      Copied from CRM #[id number]. Topic: Clinical - Red Word Triage >> Apr 28, 2024  1:45 PM Sophia H wrote: Red Word that prompted transfer to Nurse Triage: [name] (niece) is calling on behalf of the patient. States went to [facility] last night and patient was discharged but in excruciating pain. [name] is on her way back to patient now, requesting appt with PCP. NT Reason for Disposition  Requesting regular office appointment  Answer Assessment - Initial Assessment Questions 1. REASON FOR CALL: What is the main reason for your call? or How can I best help you?   Patients niece [name] called into triage on behalf of the patient to schedule hospital follow up appointment.  Patient was discharged from  [facility] last night  10/23 with a diagnosis of generalized body aches. [name] stated patient is going to be taken back to the hospital today due to current excruciating pain. Post hospitalization appt. Scheduled for 11/5  Protocols used: Information Only Call - No Triage-A-AH

## 2024-04-28 NOTE — ED Notes (Signed)
  at bedside

## 2024-04-28 NOTE — ED Triage Notes (Signed)
 Pt to er, pt states that she was here last night for the same thing, states that she is back for all over body aches and L leg swelling.

## 2024-04-28 NOTE — ED Notes (Addendum)
 [name]

## 2024-04-28 NOTE — ED Provider Notes (Signed)
 [place] EMERGENCY DEPARTMENT AT [facility] Provider Note   CSN: [id number] Arrival date & time: 04/28/24  1516     History  Chief Complaint  Patient presents with   Leg Pain    Laurie Wallace is a 78 y.o. female with PMH as listed below who presents with leg pain. Patient presents with her niece who provides additional history.  Patient presents with allover body pain particularly in her extremities for the last several days to weeks.  She was here last night for the same thing, states that she is back for all over body aches and L leg swelling.  She does not remember any trauma or falls, but the niece says that she has had some difficulty with her memory and cognition over the last 6 months and may not remember if she actually had fallen or not.  She reports pain and swelling to her left knee and distal thigh.  No numbness tingling.  She does have a history of a DVT. Was seen yesterday for body aches and WBC found to be 60.5. Provider PA Suellen last night spoke with oncology who stated that has been gradually increasing and so oncology would follow up outpatient. 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 Noted at that time chronic leukocytosis+thrombocytosis+anemia c/f CML or CMML, and oncology recommended bone marrow bx. Had anemia profile on 04/05/24.  She denies any chest or abdominal pain, shortness of breath, fever/chills.  Endorses nausea but no urinary symptoms or diarrhea.  Niece reports a concern that patient lives on her own and is not able to take care of herself.  She has been hunched over in pain and not wanting to eat or drink anything either.  Past Medical History:  Diagnosis Date   Allergy    Anemia    Iron  Deficiency   Arthritis    Rheumatoid Arthritis   Family history of adverse reaction to anesthesia    Sister has N/V   GERD (gastroesophageal reflux disease)    Peripheral  vascular disease    Pneumonia        Home Medications Prior to Admission medications   Medication Sig Start Date End Date Taking? Authorizing Provider  aspirin EC 81 MG tablet Take 81 mg by mouth daily. Swallow whole.   Yes [provider]  ELIQUIS  5 MG TABS tablet Take 1 tablet (5 mg total) by mouth 2 (two) times daily. 02/08/24  Yes Rogers Hai, MD  folic acid  (FOLVITE ) 1 MG tablet Take 1 tablet (1 mg total) by mouth daily. 12/23/23  Yes Rice, Lonni ORN, MD  levocetirizine (XYZAL ) 5 MG tablet Take 1 tablet (5 mg total) by mouth every evening. 10/13/23  Yes [name], MD  methotrexate  (RHEUMATREX) 2.5 MG tablet Take 6 tablets (15 mg total) by mouth once a week. Caution:Chemotherapy. Protect from light. Patient taking differently: Take 15 mg by mouth every Tuesday. Caution:Chemotherapy. Protect from light. 03/27/24  Yes Rice, Lonni ORN, MD  famotidine  (PEPCID ) 40 MG tablet TAKE ONE TABLET BY MOUTH DAILY Patient not taking: Reported on 04/28/2024 04/10/24   Patel, Rutwik K, MD  brompheniramine (VAZOL) 2 MG/5ML LIQD Take 2 mg by mouth 2 (two) times daily.  07/23/20  [provider]      Allergies    Patient has no known allergies.    Review of Systems   Review of Systems A 10 point review of systems was performed and is  negative unless otherwise reported in HPI.  Physical Exam Updated Vital Signs BP (!) 115/50   Pulse 94   Temp (!) 97.4 F (36.3 C) (Oral)   Resp 14   Ht 5' 6 (1.676 m)   Wt 54.4 kg   SpO2 95%   BMI 19.37 kg/m  Physical Exam General: Normal appearing elderly female, lying in bed.  HEENT: PERRLA, Sclera anicteric, MMM, trachea midline.  Cardiology: RRR, no murmurs/rubs/gallops.  Resp: Normal respiratory rate and effort. CTAB, no wheezes, rhonchi, crackles.  Abd: Soft, non-tender, non-distended. No rebound tenderness or guarding.  GU: Deferred. MSK: Tenderness to palpation of the distal left thigh and the left knee with mild  swelling and mild joint effusion noted.  Pain with active range of motion but not passive range of motion.  No warmth to the joint.  No deformities or other signs of injury.  Soft compartments.  No calf swelling.  NVI.  Intact flexion extension. Skin: warm, dry. Back: No CVA tenderness Neuro: A&Ox4, CNs II-XII grossly intact. MAEs. Sensation grossly intact.  Psych: Normal mood and affect.   ED Results / Procedures / Treatments   Labs (all labs ordered are listed, but only abnormal results are displayed) Labs Reviewed  CBC WITH DIFFERENTIAL/PLATELET - Abnormal; Notable for the following components:      Result Value   WBC 58.5 (*)    RBC 2.59 (*)    Hemoglobin 8.3 (*)    HCT 26.3 (*)    MCV 101.5 (*)    RDW 19.7 (*)    Platelets 447 (*)    Neutro Abs 26.8 (*)    Lymphs Abs 5.9 (*)    Monocytes Absolute 6.7 (*)    Eosinophils Absolute 1.1 (*)    Basophils Absolute 0.4 (*)    Abs Immature Granulocytes 17.47 (*)    All other components within normal limits  COMPREHENSIVE METABOLIC PANEL WITH GFR - Abnormal; Notable for the following components:   CO2 20 (*)    Creatinine, Ser 1.12 (*)    GFR, Estimated 50 (*)    Anion gap 16 (*)    All other components within normal limits  URINALYSIS, W/ REFLEX TO CULTURE (INFECTION SUSPECTED)  PROTIME-INR  URIC ACID  PHOSPHORUS  BCR-ABL1, CML/ALL, PCR, QUANT  TROPONIN T, HIGH SENSITIVITY    EKG EKG Interpretation Date/Time:  Friday April 28 2024 15:46:58 EDT Ventricular Rate:  94 PR Interval:  152 QRS Duration:  81 QT Interval:  342 QTC Calculation: 428 R Axis:   42  Text Interpretation: Sinus rhythm Abnormal R-wave progression, early transition Borderline repolarization abnormality Confirmed by Franklyn Gills [phone]) on 04/28/2024 4:30:03 PM  Radiology US  Venous Img Lower  Left (DVT Study) Result Date: 04/28/2024 CLINICAL DATA:  L leg swellingand pain EXAM: LEFT LOWER EXTREMITY VENOUS DOPPLER ULTRASOUND TECHNIQUE: Gray-scale  sonography with graded compression, as well as color Doppler and duplex ultrasound were performed to evaluate the lower extremity deep venous systems from the level of the common femoral vein and including the common femoral, femoral, profunda femoral, popliteal and calf veins including the posterior tibial, peroneal and gastrocnemius veins when visible. The superficial great saphenous vein was also interrogated. Spectral Doppler was utilized to evaluate flow at rest and with distal augmentation maneuvers in the common femoral, femoral and popliteal veins. COMPARISON:  09/30/2023 FINDINGS: Contralateral Common Femoral Vein: Respiratory phasicity is normal and symmetric with the symptomatic side. No evidence of thrombus. Normal compressibility. Common Femoral Vein: No evidence of thrombus. Normal compressibility,  respiratory phasicity and response to augmentation. Saphenofemoral Junction: No evidence of thrombus. Normal compressibility and flow on color Doppler imaging. Profunda Femoral Vein: No evidence of thrombus. Normal compressibility and flow on color Doppler imaging. Femoral Vein: No evidence of thrombus. Normal compressibility, respiratory phasicity and response to augmentation. Popliteal Vein: No evidence of thrombus. Normal compressibility, respiratory phasicity and response to augmentation. Calf Veins: No evidence of thrombus. Normal compressibility and flow on color Doppler imaging. Superficial Great Saphenous Vein: No evidence of thrombus. Normal compressibility. Other Findings:  None. IMPRESSION: Negative for deep venous thrombosis in the left leg. Electronically Signed   By: Rogelia Myers M.D.   On: 04/28/2024 17:59   DG Knee 2 Views Left Result Date: 04/28/2024 CLINICAL DATA:  Left knee pain and swelling. EXAM: LEFT KNEE - 1-2 VIEW COMPARISON:  None Available. FINDINGS: No evidence of fracture or dislocation. There may be a small joint effusion. No evidence of arthropathy or other focal bone  abnormality. Mild soft tissue edema. IMPRESSION: Mild soft tissue edema and possible small joint effusion. No acute osseous abnormality. Electronically Signed   By: Andrea Gasman M.D.   On: 04/28/2024 17:20   DG Chest Portable 1 View Result Date: 04/28/2024 EXAM: 1 VIEW(S) XRAY OF THE CHEST 04/28/2024 03:49:47 PM COMPARISON: 02/24/2024 CLINICAL HISTORY: body pain, leukocytosis. pt states that she was here last night for the same thing, states that she is back for all over body aches and L leg swelling. FINDINGS: LUNGS AND PLEURA: No focal pulmonary opacity. No pulmonary edema. No pleural effusion. No pneumothorax. HEART AND MEDIASTINUM: New aortic stent in place. BONES AND SOFT TISSUES: No acute osseous abnormality. IMPRESSION: 1. No acute cardiopulmonary process. 2. New aortic stent in place. Electronically signed by: Waddell Calk MD 04/28/2024 04:16 PM EDT RP Workstation: [id number]    Procedures Procedures    Medications Ordered in ED Medications  lactated ringers  bolus 1,000 mL (has no administration in time range)  morphine (PF) 4 MG/ML injection 4 mg (4 mg Intravenous Given 04/28/24 1710)  ondansetron  (ZOFRAN -ODT) disintegrating tablet 4 mg (4 mg Oral Given 04/28/24 1709)    ED Course/ Medical Decision Making/ A&P                          Medical Decision Making Amount and/or Complexity of Data Reviewed Labs: ordered. Decision-making details documented in ED Course. Radiology: ordered. Decision-making details documented in ED Course.  Risk Prescription drug management. Decision regarding hospitalization.    This patient presents to the ED for concern of all over body pain, this involves an extensive number of treatment options, and is a complaint that carries with it a high risk of complications and morbidity.  I considered the following differential and admission for this acute, potentially life threatening condition.   MDM:    I discussed with the patient and her niece  at bedside the concern for possible CML or CMML.  They have not yet seen an oncologist in the outpatient setting.  She is continuing to have severe pain in her extremities and significant weakness.  She has had decreased p.o. intake and significant weakness.  Reassuringly she has no significant electrolyte derangements or renal injury.  She reports no chest pain but due to weakness obtain an EKG and troponin which do not show concern for ischemia or arrhythmia.  No significant hypo-/hyperglycemia.  She does have anemia that dropped from 9.9 yesterday; she reports no bloody stool but this will need  to be trended.  Her WBC is 58.5, immature granulocyte abs is 17. This raises c/f blast crisis and will consult with oncology.  Will give her fluids.  She has no fever chills or symptoms of UTI, pneumonia, intra-abdominal infection.  She has no strokelike symptoms or respiratory distress/hypoxia.  She had a CT yesterday that was reassuring and a negative viral panel.  She does have pain and swelling in her left knee/distal thigh and I obtained a DVT study of the left lower extremity which was negative.  Also obtained an x-ray which showed some soft tissue edema and possible joint effusion.  Patient does not have any signs of compartment syndrome, has good distal perfusion and pulses.  No erythema, induration, or fluctuance, lower concern for cellulitis or soft tissue infection.  It is possible that the patient could have fallen and not remember according to the niece, due to her recent cognitive decline.  She does have intact flexion and extension, no concern for patellar or quadriceps tendon rupture.  Also consider other ligamentous or tendon injury.  She has no fever/chills, diffuse erythema or warmth to the joint, no pain with passive range of motion that would indicate septic arthritis.  Patient is noted to be oozing at the site of her IV.  Though her platelets are 447, I am concerned that she may have some platelet  dysfunction and bleeding.  Therefore, I do think that hemarthrosis is also on the differential for her left knee effusion.  This bleeding would also be a concern/risk for an arthrocentesis as well.  She is able to bear weight on it but it is painful.  Will CTM.    Clinical Course as of 04/28/24 2055  Fri Apr 28, 2024  1630 DG Chest Portable 1 View 1. No acute cardiopulmonary process. 2. New aortic stent in place.   [HN]  1727 WBC(!!): 58.5 [HN]  1727 Hemoglobin(!): 8.3 Dropped from 9.9 yesterday [HN]  1727 Troponin T High Sensitivity: <15 [HN]  1728 DG Knee 2 Views Left Mild soft tissue edema and possible small joint effusion. No acute osseous abnormality.   [HN]  1802 US  Venous Img Lower  Left (DVT Study) Negative for deep venous thrombosis in the left leg. [HN]  1941 Consulted to oncology  [HN]  1948 D/w oncologist Dr. Autumn who recommends BCR/ABL PCR quant.  He recommends supportive care.  I will admit the patient to the hospital with fluids.  Dr. Autumn states that he will also notify Dr. Davonna. Dr. Sherlon aware and will admit the patient. [HN]    Clinical Course User Index [HN] Franklyn Sid SAILOR, MD    Labs: I Ordered, and personally interpreted labs.  The pertinent results include: Those listed above  Imaging Studies ordered: I ordered imaging studies including chest x-ray, left knee x-ray, left lower extremity DVT study I independently visualized and interpreted imaging. I agree with the radiologist interpretation  Additional history obtained from chart review, niece at bedside.   Cardiac Monitoring: The patient was maintained on a cardiac monitor.  I personally viewed and interpreted the cardiac monitored which showed an underlying rhythm of: Normal sinus rhythm  Reevaluation: After the interventions noted above, I reevaluated the patient and found that they have :improved  Social Determinants of Health:  lives independently  Disposition: Admit to  medicine  Co morbidities that complicate the patient evaluation  Past Medical History:  Diagnosis Date   Allergy    Anemia    Iron  Deficiency   Arthritis  Rheumatoid Arthritis   Family history of adverse reaction to anesthesia    Sister has N/V   GERD (gastroesophageal reflux disease)    Peripheral vascular disease    Pneumonia      Medicines Meds ordered this encounter  Medications   DISCONTD: ondansetron  (ZOFRAN ) injection 4 mg   morphine (PF) 4 MG/ML injection 4 mg    Refill:  0   ondansetron  (ZOFRAN -ODT) disintegrating tablet 4 mg   lactated ringers  bolus 1,000 mL    I have reviewed the patients home medicines and have made adjustments as needed  Problem List / ED Course: Problem List Items Addressed This Visit       Other   Leukocytosis   Other Visit Diagnoses       Generalized weakness    -  Primary     Acute pain of left knee         Ambulatory dysfunction                       This note was created using dictation software, which may contain spelling or grammatical errors.    Franklyn Sid SAILOR, MD 04/28/24 2100

## 2024-04-28 NOTE — H&P (Signed)
 [facility] H&P   Patient Demographics:    Laurie Wallace, is a 78 y.o. female  MRN: [id number]   DOB - 1945/09/03  Admit Date - 04/28/2024  Outpatient Primary MD for the patient is [name], MD  Referring MD/NP/PA: Dr. Franklyn  Outpatient Specialists: oncology   Dr. Rogers  Patient coming from: Home  Chief Complaint  Patient presents with   Leg Pain      HPI:    Laurie Wallace  is a 78 y.o. female, with past medical history of COPD, rheumatoid arthritis, diagnosis of PE earlier in the year, and diagnosis of thrombosis status post endovascular stent placement  04/14/2024, patient undergoing workup for leukocytosis with concern of lymphoproliferative malignancy (CML versus CMML), which she missed most recent 2 appointment earlier this week due to not feeling well, and living by herself with family not aware she has appointment scheduled with oncology - Presents to ED secondary to generalized weakness, fatigue, left lower extremity pain, denies fever, chills, chest pain or shortness of breath, reports she is compliant with her Eliquis . - ED her workup significant for significant leukocytosis at 58K, anemia with hemoglobin of 8.3, platelet count at 447K, with absolute granulocyte at 17K, INR at 2.1, venous Dopplers negative for left lower extremity edema, x-ray significant for left knee soft tissue edema with possible small knee effusion, ED discussed with oncology on-call who recommended admission for hydration, PT, OT and supportive care.    Review of systems:     A full 10 point Review of Systems was done, except as stated above, all other Review of Systems were negative.   With Past History of the following :    Past Medical History:  Diagnosis Date   Allergy    Anemia    Iron  Deficiency   Arthritis    Rheumatoid Arthritis   Family history of adverse  reaction to anesthesia    Sister has N/V   GERD (gastroesophageal reflux disease)    Peripheral vascular disease    Pneumonia       Past Surgical History:  Procedure Laterality Date   ABDOMINAL HYSTERECTOMY  2000   THORACIC AORTIC ENDOVASCULAR STENT GRAFT N/A 04/12/2024   Procedure: INSERTION, ENDOVASCULAR STENT GRAFT, AORTA, THORACIC;  Surgeon: Magda Debby SAILOR, MD;  Location: [facility] OR;  Service: Vascular;  Laterality: N/A;   TOE SURGERY Right 2015   1st toe   ULTRASOUND GUIDANCE FOR VASCULAR ACCESS Right 04/12/2024   Procedure: ULTRASOUND GUIDANCE, FOR VASCULAR ACCESS;  Surgeon: Magda Debby SAILOR, MD;  Location: [facility] OR;  Service: Vascular;  Laterality: Right;      Social History:     Social History   Tobacco Use   Smoking status: Never    Passive exposure: Never   Smokeless tobacco: Never  Substance Use Topics   Alcohol use: No        Family History :  Family History  Problem Relation Age of Onset   Cancer Mother    Cancer Father    Heart failure Sister    COPD Sister    Dementia Sister       Home Medications:   Prior to Admission medications   Medication Sig Start Date End Date Taking? Authorizing Provider  aspirin EC 81 MG tablet Take 81 mg by mouth daily. Swallow whole.   Yes [provider]  ELIQUIS  5 MG TABS tablet Take 1 tablet (5 mg total) by mouth 2 (two) times daily. 02/08/24  Yes Rogers Hai, MD  folic acid  (FOLVITE ) 1 MG tablet Take 1 tablet (1 mg total) by mouth daily. 12/23/23  Yes Rice, Lonni ORN, MD  levocetirizine (XYZAL ) 5 MG tablet Take 1 tablet (5 mg total) by mouth every evening. 10/13/23  Yes [name], MD  methotrexate  (RHEUMATREX) 2.5 MG tablet Take 6 tablets (15 mg total) by mouth once a week. Caution:Chemotherapy. Protect from light. Patient taking differently: Take 15 mg by mouth every Tuesday. Caution:Chemotherapy. Protect from light. 03/27/24  Yes Rice, Lonni ORN, MD  famotidine  (PEPCID ) 40 MG tablet TAKE ONE  TABLET BY MOUTH DAILY Patient not taking: Reported on 04/28/2024 04/10/24   Patel, Rutwik K, MD  brompheniramine (VAZOL) 2 MG/5ML LIQD Take 2 mg by mouth 2 (two) times daily.  07/23/20  [provider]     Allergies:    No Known Allergies   Physical Exam:   Vitals  Blood pressure (!) 115/50, pulse 94, temperature (!) 97.4 F (36.3 C), temperature source Oral, resp. rate 14, height 5' 6 (1.676 m), weight 51.8 kg, SpO2 95%.   1. General Frail, chronically ill appearing female, laying in bed in no apparent distress  2. Normal affect and insight, Not Suicidal or Homicidal, Awake Alert, Oriented X 3.  3. No F.N deficits, ALL C.Nerves Intact, Strength 5/5 all 4 extremities, Sensation intact all 4 extremities, Plantars down going.  4. Ears and Eyes appear Normal, Conjunctivae clear, PERRLA. Moist Oral Mucosa.  5. Supple Neck, No JVD, No Carotid Bruits.  6. Symmetrical Chest wall movement, Good air movement bilaterally, CTAB.  7. RRR, No Gallops, Rubs or Murmurs, No Parasternal Heave.  8. Positive Bowel Sounds, Abdomen Soft, No tenderness, No organomegaly appriciated,No rebound -guarding or rigidity.  9.  No Cyanosis, Normal Skin Turgor, No Skin Rash or Bruise.  10. Good muscle tone, right knee tenderness to palpation and mild effusion   Data Review:    CBC Recent Labs  Lab 04/27/24 1606 04/28/24 1532  WBC 60.5* 58.5*  HGB 9.9* 8.3*  HCT 31.8* 26.3*  PLT 829* 447*  MCV 100.3* 101.5*  MCH 31.2 32.0  MCHC 31.1 31.6  RDW 21.0* 19.7*  LYMPHSABS 11.5* 5.9*  MONOABS 3.0* 6.7*  EOSABS 1.2* 1.1*  BASOSABS 0.0 0.4*   ------------------------------------------------------------------------------------------------------------------  Chemistries  Recent Labs  Lab 04/27/24 1606 04/28/24 1532  NA 139 137  K 4.2 4.1  CL 100 101  CO2 23 20*  GLUCOSE 93 93  BUN 11 11  CREATININE 1.08* 1.12*  CALCIUM 9.5 9.1  AST 28 30  ALT 11 9  ALKPHOS 123 112  BILITOT 0.5  0.5   ------------------------------------------------------------------------------------------------------------------ estimated creatinine clearance is 33.9 mL/min (A) (by C-G formula based on SCr of 1.12 mg/dL (H)). ------------------------------------------------------------------------------------------------------------------ No results for input(s): TSH, T4TOTAL, T3FREE, THYROIDAB in the last 72 hours.  Invalid input(s): FREET3  Coagulation profile Recent Labs  Lab 04/28/24 1955  INR 2.1*   -------------------------------------------------------------------------------------------------------------------  No results for input(s): DDIMER in the last 72 hours. -------------------------------------------------------------------------------------------------------------------  Cardiac Enzymes No results for input(s): CKMB, TROPONINI, MYOGLOBIN in the last 168 hours.  Invalid input(s): CK ------------------------------------------------------------------------------------------------------------------ No results found for: BNP   ---------------------------------------------------------------------------------------------------------------  Urinalysis    Component Value Date/Time   COLORURINE STRAW (A) 04/11/2024 1425   APPEARANCEUR CLEAR 04/11/2024 1425   LABSPEC 1.003 (L) 04/11/2024 1425   PHURINE 7.0 04/11/2024 1425   GLUCOSEU NEGATIVE 04/11/2024 1425   HGBUR NEGATIVE 04/11/2024 1425   BILIRUBINUR NEGATIVE 04/11/2024 1425   KETONESUR NEGATIVE 04/11/2024 1425   PROTEINUR NEGATIVE 04/11/2024 1425   NITRITE NEGATIVE 04/11/2024 1425   LEUKOCYTESUR NEGATIVE 04/11/2024 1425    ----------------------------------------------------------------------------------------------------------------   Imaging Results:    US  Venous Img Lower  Left (DVT Study) Result Date: 04/28/2024 CLINICAL DATA:  L leg swellingand pain EXAM: LEFT LOWER EXTREMITY VENOUS  DOPPLER ULTRASOUND TECHNIQUE: Gray-scale sonography with graded compression, as well as color Doppler and duplex ultrasound were performed to evaluate the lower extremity deep venous systems from the level of the common femoral vein and including the common femoral, femoral, profunda femoral, popliteal and calf veins including the posterior tibial, peroneal and gastrocnemius veins when visible. The superficial great saphenous vein was also interrogated. Spectral Doppler was utilized to evaluate flow at rest and with distal augmentation maneuvers in the common femoral, femoral and popliteal veins. COMPARISON:  09/30/2023 FINDINGS: Contralateral Common Femoral Vein: Respiratory phasicity is normal and symmetric with the symptomatic side. No evidence of thrombus. Normal compressibility. Common Femoral Vein: No evidence of thrombus. Normal compressibility, respiratory phasicity and response to augmentation. Saphenofemoral Junction: No evidence of thrombus. Normal compressibility and flow on color Doppler imaging. Profunda Femoral Vein: No evidence of thrombus. Normal compressibility and flow on color Doppler imaging. Femoral Vein: No evidence of thrombus. Normal compressibility, respiratory phasicity and response to augmentation. Popliteal Vein: No evidence of thrombus. Normal compressibility, respiratory phasicity and response to augmentation. Calf Veins: No evidence of thrombus. Normal compressibility and flow on color Doppler imaging. Superficial Great Saphenous Vein: No evidence of thrombus. Normal compressibility. Other Findings:  None. IMPRESSION: Negative for deep venous thrombosis in the left leg. Electronically Signed   By: Rogelia Myers M.D.   On: 04/28/2024 17:59   DG Knee 2 Views Left Result Date: 04/28/2024 CLINICAL DATA:  Left knee pain and swelling. EXAM: LEFT KNEE - 1-2 VIEW COMPARISON:  None Available. FINDINGS: No evidence of fracture or dislocation. There may be a small joint effusion. No  evidence of arthropathy or other focal bone abnormality. Mild soft tissue edema. IMPRESSION: Mild soft tissue edema and possible small joint effusion. No acute osseous abnormality. Electronically Signed   By: Andrea Gasman M.D.   On: 04/28/2024 17:20   DG Chest Portable 1 View Result Date: 04/28/2024 EXAM: 1 VIEW(S) XRAY OF THE CHEST 04/28/2024 03:49:47 PM COMPARISON: 02/24/2024 CLINICAL HISTORY: body pain, leukocytosis. pt states that she was here last night for the same thing, states that she is back for all over body aches and L leg swelling. FINDINGS: LUNGS AND PLEURA: No focal pulmonary opacity. No pulmonary edema. No pleural effusion. No pneumothorax. HEART AND MEDIASTINUM: New aortic stent in place. BONES AND SOFT TISSUES: No acute osseous abnormality. IMPRESSION: 1. No acute cardiopulmonary process. 2. New aortic stent in place. Electronically signed by: Waddell Calk MD 04/28/2024 04:16 PM EDT RP Workstation: [id number]      Assessment & Plan:    Principal Problem:   Weakness Active Problems:   Seropositive rheumatoid  arthritis (HCC)   Thrombocytosis   Pulmonary embolism (HCC)   MPN (myeloproliferative neoplasm) (HCC)   Weakness/deconditioning -Is most likely in the setting of clinical dehydration, and her progressive lymphoproliferative malignancy - Consult PT, OT. - Will start on gentle hydration.  Leukocytosis Thrombocytosis Anemia Lymphoproliferative malignancy>> concern for CML versus CMML -ED discussed with oncology on-call at [facility] Dr. Autumn, who recommended admission to [facility], and to continue with the fluid and supportive care at this point is not concerned about blast crisis, recommending to repeat labs in a.m., commended to check BCR/ABL -Follow on uric acid, phosphorus. - Does appear patient has missed 2 follow-up appointments with oncology, but apparently family were not aware of these follow-ups  Pulmonary embolism - Continue with  Eliquis  - Patient with history of recent aortic thrombosis status post insertion of endovascular thoracic aortic stent graft by Dr. Magda on 04/14/2024  seropositive rheumatoid arthritis - He is on methotrexate  as an outpatient  DVT Prophylaxis Eliquis   AM Labs Ordered, also please review Full Orders  Family Communication: Admission, patients condition and plan of care including tests being ordered have been discussed with the patient and son and niece who indicate understanding and agree with the plan and Code Status.  Code Status full code  Likely DC to home  Consults called: ED discussed with oncology at [facility]  Admission status: Observation  Time spent in minutes : 70 minutes   Brayton Lye M.D on 04/28/2024 at 9:44 PM   [facility] - Office  [phone]

## 2024-04-29 DIAGNOSIS — D72829 Elevated white blood cell count, unspecified: Secondary | ICD-10-CM

## 2024-04-29 DIAGNOSIS — M059 Rheumatoid arthritis with rheumatoid factor, unspecified: Secondary | ICD-10-CM

## 2024-04-29 DIAGNOSIS — I2694 Multiple subsegmental pulmonary emboli without acute cor pulmonale: Secondary | ICD-10-CM | POA: Diagnosis not present

## 2024-04-29 DIAGNOSIS — R531 Weakness: Secondary | ICD-10-CM | POA: Diagnosis not present

## 2024-04-29 LAB — CBC WITH DIFFERENTIAL/PLATELET
Abs Immature Granulocytes: 4.5 K/uL — ABNORMAL HIGH (ref 0.00–0.07)
Band Neutrophils: 4 %
Basophils Absolute: 0 K/uL (ref 0.0–0.1)
Basophils Relative: 0 %
Blasts: 2 %
Eosinophils Absolute: 2 K/uL — ABNORMAL HIGH (ref 0.0–0.5)
Eosinophils Relative: 4 %
HCT: 26.9 % — ABNORMAL LOW (ref 36.0–46.0)
Hemoglobin: 8.4 g/dL — ABNORMAL LOW (ref 12.0–15.0)
Lymphocytes Relative: 25 %
Lymphs Abs: 12.6 K/uL — ABNORMAL HIGH (ref 0.7–4.0)
MCH: 32.2 pg (ref 26.0–34.0)
MCHC: 31.2 g/dL (ref 30.0–36.0)
MCV: 103.1 fL — ABNORMAL HIGH (ref 80.0–100.0)
Metamyelocytes Relative: 7 %
Monocytes Absolute: 2.5 K/uL — ABNORMAL HIGH (ref 0.1–1.0)
Monocytes Relative: 5 %
Myelocytes: 2 %
Neutro Abs: 27.6 K/uL — ABNORMAL HIGH (ref 1.7–7.7)
Neutrophils Relative %: 51 %
Platelets: 810 K/uL — ABNORMAL HIGH (ref 150–400)
RBC: 2.61 MIL/uL — ABNORMAL LOW (ref 3.87–5.11)
RDW: 19.5 % — ABNORMAL HIGH (ref 11.5–15.5)
WBC: 50.2 K/uL (ref 4.0–10.5)
nRBC: 0.1 % (ref 0.0–0.2)

## 2024-04-29 LAB — URINALYSIS, W/ REFLEX TO CULTURE (INFECTION SUSPECTED)
Bilirubin Urine: NEGATIVE
Glucose, UA: NEGATIVE mg/dL
Hgb urine dipstick: NEGATIVE
Ketones, ur: NEGATIVE mg/dL
Nitrite: NEGATIVE
Protein, ur: NEGATIVE mg/dL
RBC / HPF: >50 RBC/hpf (ref 0–5)
Specific Gravity, Urine: 1.002 — ABNORMAL LOW (ref 1.005–1.030)
pH: 6 (ref 5.0–8.0)

## 2024-04-29 LAB — URIC ACID: Uric Acid, Serum: 8.7 mg/dL — ABNORMAL HIGH (ref 2.5–7.1)

## 2024-04-29 LAB — BASIC METABOLIC PANEL WITH GFR
Anion gap: 12 (ref 5–15)
BUN: 7 mg/dL — ABNORMAL LOW (ref 8–23)
CO2: 25 mmol/L (ref 22–32)
Calcium: 8.6 mg/dL — ABNORMAL LOW (ref 8.9–10.3)
Chloride: 108 mmol/L (ref 98–111)
Creatinine, Ser: 0.96 mg/dL (ref 0.44–1.00)
GFR, Estimated: >60 mL/min (ref >60)
Glucose, Bld: 69 mg/dL — ABNORMAL LOW (ref 70–99)
Potassium: 4 mmol/L (ref 3.5–5.1)
Sodium: 145 mmol/L (ref 135–145)

## 2024-04-29 NOTE — NC FL2 (Signed)
 [place]  MEDICAID FL2 LEVEL OF CARE FORM     IDENTIFICATION  Patient Name: Laurie Wallace [facility] Birthdate: August 17, 1945 Sex: female Admission Date (Current Location): 04/28/2024  [facility] and [place] Number:  [place]   Facility and Address:         Provider Number: [id number]  Attending Physician Name and Address:  Cherlyn Labella, MD  Relative Name and Phone Number:  Nala, Kachel)  [phone] [facility])    Current Level of Care: Hospital Recommended Level of Care: Skilled Nursing Facility Prior Approval Number:    Date Approved/Denied:   PASRR Number: [id number]  Discharge Plan: SNF    Current Diagnoses: Patient Active Problem List   Diagnosis Date Noted   Weakness 04/28/2024   MPN (myeloproliferative neoplasm) (HCC) 04/13/2024   Monocytosis 04/13/2024   Leukocytosis 04/13/2024   S/P insertion of endovascular thoracic aortic stent graft 04/12/2024   Aortic thrombus (HCC) 04/12/2024   COVID-19 11/08/2023   Acute non-recurrent maxillary sinusitis 11/08/2023   Lupus anticoagulant positive 10/13/2023   Dizziness 10/13/2023   Pulmonary embolism (HCC) 09/29/2023   Pulmonary nodule 1 cm or greater in diameter 06/02/2023   Community acquired pneumonia of left lower lobe of lung 06/02/2023   Microcytic anemia 02/17/2023   Hospital discharge follow-up 01/18/2023   Gastroesophageal reflux disease 01/18/2023   Gait disturbance 01/18/2023   Nonintractable episodic headache 01/18/2023   Infectious colitis 01/10/2023   Hypokalemia 01/10/2023   Thrombocytosis 01/10/2023   Bilateral shoulder pain 12/22/2022   Localized swelling of right forearm 09/21/2022   Subcutaneous cyst 07/20/2022   Allergic sinusitis 01/14/2022   Encounter for general adult medical examination with abnormal findings 07/17/2021   Age-related osteoporosis without current pathological fracture 07/17/2021   Muscle cramps 02/28/2021   High risk medication use 12/26/2020    Seropositive rheumatoid arthritis (HCC) 10/25/2020   Moderate protein-calorie malnutrition 10/25/2020   Cervical spine pain 10/23/2020   Lumbar pain 10/23/2020   Other intervertebral disc displacement, lumbar region 04/07/2010    Orientation RESPIRATION BLADDER Height & Weight     Self, Time, Situation, Place  Normal Continent Weight: 114 lb 3.2 oz (51.8 kg) Height:  5' 6 (167.6 cm)  BEHAVIORAL SYMPTOMS/MOOD NEUROLOGICAL BOWEL NUTRITION STATUS      Continent Diet  AMBULATORY STATUS COMMUNICATION OF NEEDS Skin   Limited Assist Verbally Normal                       Personal Care Assistance Level of Assistance  Bathing, Feeding, Dressing Bathing Assistance: Limited assistance Feeding assistance: Limited assistance Dressing Assistance: Limited assistance     Functional Limitations Info  Sight, Hearing          SPECIAL CARE FACTORS FREQUENCY  PT (By licensed PT), OT (By licensed OT)     PT Frequency: 5 X a week OT Frequency: 5 X a week            Contractures Contractures Info: Not present    Additional Factors Info  Code Status, Allergies Code Status Info: Full Allergies Info: NKA           Current Medications (04/29/2024):  This is the current hospital active medication list Current Facility-Administered Medications  Medication Dose Route Frequency Provider Last Rate Last Admin   0.9 %  sodium chloride  infusion   Intravenous Continuous Elgergawy, Dawood S, MD 75 mL/hr at 04/29/24 1326 New Bag at 04/29/24 1326   acetaminophen  (TYLENOL ) tablet 650 mg  650 mg Oral Q6H PRN Elgergawy, Dawood S, MD  Or   acetaminophen  (TYLENOL ) suppository 650 mg  650 mg Rectal Q6H PRN Elgergawy, Dawood S, MD       apixaban  (ELIQUIS ) tablet 5 mg  5 mg Oral BID Elgergawy, Dawood S, MD   5 mg at 04/29/24 0940   aspirin EC tablet 81 mg  81 mg Oral Daily Elgergawy, Dawood S, MD   81 mg at 04/29/24 9060   famotidine  (PEPCID ) tablet 20 mg  20 mg Oral Daily Elgergawy, Dawood  S, MD   20 mg at 04/29/24 9060   folic acid  (FOLVITE ) tablet 1 mg  1 mg Oral Daily Elgergawy, Dawood S, MD   1 mg at 04/29/24 0939   hydrALAZINE (APRESOLINE) injection 5 mg  5 mg Intravenous Q4H PRN Elgergawy, Dawood S, MD       loratadine  (CLARITIN ) tablet 5 mg  5 mg Oral Daily Niels Kayla FALCON, RPH   5 mg at 04/29/24 0940   morphine (PF) 2 MG/ML injection 2 mg  2 mg Intravenous Q4H PRN Elgergawy, Dawood S, MD       oxyCODONE  (Oxy IR/ROXICODONE ) immediate release tablet 5 mg  5 mg Oral Q4H PRN Elgergawy, Dawood S, MD   5 mg at 04/29/24 9073     Discharge Medications: Please see discharge summary for a list of discharge medications.  Relevant Imaging Results:  Relevant Lab Results:   Additional Information [id number]  Lorraine LILLETTE Fenton, LCSW

## 2024-04-29 NOTE — Evaluation (Signed)
 Physical Therapy Evaluation Patient Details Name: Laurie Wallace MRN: [id number] DOB: 07/31/1945 Today's Date: 04/29/2024  History of Present Illness  Laurie Wallace  is a 78 y.o. female, with past medical history of COPD, rheumatoid arthritis, diagnosis of PE earlier in the year, and diagnosis of thrombosis status post endovascular stent placement   04/14/2024, patient undergoing workup for leukocytosis with concern of lymphoproliferative malignancy (CML versus CMML), which she missed most recent 2 appointment earlier this week due to not feeling well, and living by herself with family not aware she has appointment scheduled with oncology  - Presents to ED secondary to generalized weakness, fatigue, left lower extremity pain, denies fever, chills, chest pain or shortness of breath, reports she is compliant with her Eliquis .  - ED her workup significant for significant leukocytosis at 58K, anemia with hemoglobin of 8.3, platelet count at 447K, with absolute granulocyte at 17K, INR at 2.1, venous Dopplers negative for left lower extremity edema, x-ray significant for left knee soft tissue edema with possible small knee effusion, ED discussed with oncology on-call who recommended admission for hydration, PT, OT and supportive care.   Clinical Impression  Patient agreeable to PT evaluation. Patient's sister present during session and contributes some to subjective history. Patient reports at baseline she is independent with all mobility including community ambulation without AD, and ADL/iADLs as she lives alone. Patient reports she would not have any family available to assist her immediately once discharged. On this date, patient requires total assist to donn socks, is modified independent with bed mobility, but requires min assist for transfers and ambulation without an AD due to inability to bear full weight into LLE due to pain. Improvements noted with RW. Due to difficulty with standing  from bed, BSC put over toilet for height and pt is Modified independent with pericare in sitting. Returned to bed with call button near and bed alarm set. Patient will benefit from continued skilled physical therapy acutely and in recommended venue in order to address current deficits prior to returning home. Should pt make good progress while in hospital, would recommend RW and BSC once home for maximum safety.        If plan is discharge home, recommend the following: A little help with walking and/or transfers;Assist for transportation;Assistance with cooking/housework;Help with stairs or ramp for entrance   Can travel by private vehicle        Equipment Recommendations Other (comment) (No equipment recommendations if going to SNF but would recommend RW and BSC if returning home)  Recommendations for Other Services       Functional Status Assessment Patient has had a recent decline in their functional status and demonstrates the ability to make significant improvements in function in a reasonable and predictable amount of time.     Precautions / Restrictions Precautions Precautions: Fall Recall of Precautions/Restrictions: Intact Restrictions Weight Bearing Restrictions Per Provider Order: No      Mobility  Bed Mobility Overal bed mobility: Modified Independent   General bed mobility comments: HOB flat, inc time needed due to inc pain but no physical assist needed    Transfers Overall transfer level: Needs assistance Equipment used: None Transfers: Sit to/from Stand Sit to Stand: Supervision, Contact guard assist   General transfer comment: CGA due to inc pain with WB into LLE when standing from bed and toilet in session.    Ambulation/Gait Ambulation/Gait assistance: Contact guard assist, Min assist Gait Distance (Feet): 20 Feet Assistive device: Rolling walker (2 wheels), None  Gait Pattern/deviations: Step-to pattern, Decreased stance time - left, Decreased stride  length, Decreased step length - right Gait velocity: Dec     General Gait Details: atempts to ambulate without AD, pt required hand held assistx1 for inc pain with WB into LLE. improvements noted with RW, CGA given throughout for safety. pt tends to keep LLE knee straight throuhgout due to pain with flexion.  Stairs      Wheelchair Mobility     Tilt Bed    Modified Rankin (Stroke Patients Only)       Balance Overall balance assessment: Needs assistance Sitting-balance support: No upper extremity supported, Feet supported Sitting balance-Leahy Scale: Good Sitting balance - Comments: Seated EOB   Standing balance support: During functional activity, Bilateral upper extremity supported, Single extremity supported Standing balance-Leahy Scale: Fair Standing balance comment: fair without RW, good with RW         Pertinent Vitals/Pain Pain Assessment Pain Assessment: 0-10 Pain Score: 8  Pain Location: LLE Pain Descriptors / Indicators: Moaning, Discomfort, Grimacing Pain Intervention(s): Limited activity within patient's tolerance, Monitored during session    Home Living Family/patient expects to be discharged to:: Private residence Living Arrangements: Alone Available Help at Discharge: Family;Available PRN/intermittently Type of Home: Mobile home Home Access: Stairs to enter Entrance Stairs-Rails: Can reach both Entrance Stairs-Number of Steps: 4   Home Layout: One level Home Equipment: None      Prior Function Prior Level of Function : Independent/Modified Independent     Mobility Comments: Reports as community ambulator with No AD, denies falls ADLs Comments: Independent with all ADLs and iADLs     Extremity/Trunk Assessment   Upper Extremity Assessment Upper Extremity Assessment: Defer to OT evaluation (Shoulder flexion ROM limited ~110 deg with mild pain, MMT 4/5 no pain)    Lower Extremity Assessment Lower Extremity Assessment: Generalized  weakness;LLE deficits/detail (RLE ankle DF MMT 5/5, hip flexion 4/5) LLE Deficits / Details: dec WB noted during ambulation and mildly inc swelling in LLE. Ankle DF MMT 5/5, hip flexion MMT 4-/5. LLE: Unable to fully assess due to pain    Cervical / Trunk Assessment Cervical / Trunk Assessment: Normal  Communication   Communication Communication: No apparent difficulties    Cognition Arousal: Alert Behavior During Therapy: WFL for tasks assessed/performed   PT - Cognitive impairments: No apparent impairments       Following commands: Intact       Cueing Cueing Techniques: Verbal cues, Tactile cues, Visual cues     General Comments      Exercises     Assessment/Plan    PT Assessment Patient needs continued PT services;All further PT needs can be met in the next venue of care  PT Problem List Decreased strength;Decreased range of motion;Decreased activity tolerance;Decreased balance;Decreased mobility;Pain       PT Treatment Interventions DME instruction;Gait training;Stair training;Functional mobility training;Therapeutic activities;Therapeutic exercise;Balance training;Patient/family education    PT Goals (Current goals can be found in the Care Plan section)  Acute Rehab PT Goals Patient Stated Goal: Return home following short rehab stay PT Goal Formulation: With patient Time For Goal Achievement: 05/05/24 Potential to Achieve Goals: Good    Frequency Min 3X/week     Co-evaluation               AM-PAC PT 6 Clicks Mobility  Outcome Measure Help needed turning from your back to your side while in a flat bed without using bedrails?: None Help needed moving from lying on your back to  sitting on the side of a flat bed without using bedrails?: None Help needed moving to and from a bed to a chair (including a wheelchair)?: A Little Help needed standing up from a chair using your arms (e.g., wheelchair or bedside chair)?: A Little Help needed to walk in  hospital room?: A Little Help needed climbing 3-5 steps with a railing? : A Lot 6 Click Score: 19    End of Session Equipment Utilized During Treatment: Gait belt Activity Tolerance: Patient tolerated treatment well;Patient limited by pain Patient left: in bed;with call bell/phone within reach;with bed alarm set   PT Visit Diagnosis: Unsteadiness on feet (R26.81);Muscle weakness (generalized) (M62.81);Pain;Difficulty in walking, not elsewhere classified (R26.2) Pain - Right/Left: Left Pain - part of body: Knee;Leg    Time: 9152-9091 PT Time Calculation (min) (ACUTE ONLY): 21 min   Charges:   PT Evaluation $PT Eval Low Complexity: 1 Low   PT General Charges $$ ACUTE PT VISIT: 1 Visit        10:34 AM, 04/29/24 Rosaria Settler, PT, DPT [place] with [facility]

## 2024-04-29 NOTE — Plan of Care (Signed)
 ?  Problem: Coping: ?Goal: Level of anxiety will decrease ?Outcome: Progressing ?  ?Problem: Safety: ?Goal: Ability to remain free from injury will improve ?Outcome: Progressing ?  ?

## 2024-04-29 NOTE — Progress Notes (Signed)
 [facility]                                                                               Laurie Wallace, is a 78 y.o. female, DOB - [birth year], FMW:[id number] Admit date - 04/28/2024    Outpatient Primary MD for the patient is Laurie Suzzane POUR, MD  LOS - 0  days    Brief summary    Laurie Wallace  is a 78 y.o. female, with past medical history of COPD, rheumatoid arthritis, diagnosis of PE earlier in the year, and diagnosis of thrombosis status post endovascular stent placement  04/14/2024, patient undergoing workup for leukocytosis with concern of lymphoproliferative malignancy (CML versus CMML), which she missed most recent 2 appointment earlier this week due to not feeling well, and living by herself with family not aware she has appointment scheduled with oncology - Presents to ED secondary to generalized weakness, fatigue, left lower extremity pain, denies fever, chills, chest pain or shortness of breath, reports she is compliant with her Eliquis .   Assessment & Plan    Assessment and Plan:  Lymphoproliferative malignancy Concern for CML versus CMML EDP discussed with oncology on-call Dr. Chloe recommended admission to the hospital for fluids and supportive care.  BCR-ABL is pending. Patient's WBC count has improved from 58,000-50,000 today.   Generalized weakness and deconditioning in the setting of Progressive lymphoproliferative malignancy Hydrate, repeat labs in the morning and therapy evaluation. Therapy evaluations recommending SNF, patient is agreeable and TOC is on board for placement 9 9  History of pulmonary embolism Continue with anticoagulation. history of recent aortic thrombosis status post insertion of endovascular thoracic aortic stent graft by Dr. Magda on 04/14/2024   COPD No wheezing heard on exam    Rheumatoid arthritis Continue with methotrexate   Estimated body mass index is 18.43 kg/m as calculated from the  following:   Height as of this encounter: 5' 6 (1.676 m).   Weight as of this encounter: 51.8 kg.  Code Status: Full code DVT Prophylaxis:   apixaban  (ELIQUIS ) tablet 5 mg   Level of Care: Level of care: Med-Surg Family Communication: None at bedside  Disposition Plan:     Remains inpatient appropriate: Pending clinical improvement and SNF bed  Procedures:  None none  Consultants:   None  Antimicrobials:   Anti-infectives (From admission, onward)    None        Medications  Scheduled Meds:  apixaban   5 mg Oral BID   aspirin EC  81 mg Oral Daily   famotidine   20 mg Oral Daily   folic acid   1 mg Oral Daily   loratadine   5 mg Oral Daily   Continuous Infusions:  sodium chloride  75 mL/hr at 04/29/24 1326   PRN Meds:.acetaminophen  **OR** acetaminophen , hydrALAZINE, morphine injection, oxyCODONE     Subjective:   [name] was seen and examined today.  No chest pain or sob. No nausea or vomiting.   Objective:   Vitals:   04/28/24 2235 04/29/24 0102 04/29/24 0430 04/29/24 1255  BP: (!) 104/42 (!) 118/51 (!) 96/42 (!) 111/48  Pulse: 87 84 88 91  Resp: 18 19 18    Temp: 98.4 F (36.9  C) 97.9 F (36.6 C) 98.6 F (37 C) 98.1 F (36.7 C)  TempSrc: Oral Oral Oral Oral  SpO2: 98% 97% 98% 97%  Weight:      Height:        Intake/Output Summary (Last 24 hours) at 04/29/2024 1557 Last data filed at 04/29/2024 0438 Gross per 24 hour  Intake 356.24 ml  Output --  Net 356.24 ml   Filed Weights   04/28/24 1522 04/28/24 2100  Weight: 54.4 kg 51.8 kg     Exam General exam: ill appearing elderly woman, not in distress.  Respiratory system: Clear to auscultation. Respiratory effort normal. Cardiovascular system: S1 & S2 heard, RRR.  Gastrointestinal system: Abdomen is nondistended, soft and nontende Central nervous system: Alert and oriented. Extremities: Symmetric 5 x 5 power. Skin: No rashes, Psychiatry:  Mood & affect appropriate.     Data  Reviewed:  I have personally reviewed following labs and imaging studies   CBC Lab Results  Component Value Date   WBC 50.2 (HH) 04/29/2024   RBC 2.61 (L) 04/29/2024   HGB 8.4 (L) 04/29/2024   HCT 26.9 (L) 04/29/2024   MCV 103.1 (H) 04/29/2024   MCH 32.2 04/29/2024   PLT 810 (H) 04/29/2024   MCHC 31.2 04/29/2024   RDW 19.5 (H) 04/29/2024   LYMPHSABS 12.6 (H) 04/29/2024   MONOABS 2.5 (H) 04/29/2024   EOSABS 2.0 (H) 04/29/2024   BASOSABS 0.0 04/29/2024     Last metabolic panel Lab Results  Component Value Date   NA 145 04/29/2024   K 4.0 04/29/2024   CL 108 04/29/2024   CO2 25 04/29/2024   BUN 7 (L) 04/29/2024   CREATININE 0.96 04/29/2024   GLUCOSE 69 (L) 04/29/2024   GFRNONAA >60 04/29/2024   GFRAA 88 09/28/2014   CALCIUM 8.6 (L) 04/29/2024   PHOS 3.7 04/28/2024   PROT 7.4 04/28/2024   ALBUMIN 4.2 04/28/2024   LABGLOB 2.4 04/14/2024   AGRATIO 1.2 10/17/2020   BILITOT 0.5 04/28/2024   ALKPHOS 112 04/28/2024   AST 30 04/28/2024   ALT 9 04/28/2024   ANIONGAP 12 04/29/2024    CBG (last 3)  No results for input(s): GLUCAP in the last 72 hours.    Coagulation Profile: Recent Labs  Lab 04/28/24 1955  INR 2.1*     Radiology Studies: US  Venous Img Lower  Left (DVT Study) Result Date: 04/28/2024 CLINICAL DATA:  L leg swellingand pain EXAM: LEFT LOWER EXTREMITY VENOUS DOPPLER ULTRASOUND TECHNIQUE: Gray-scale sonography with graded compression, as well as color Doppler and duplex ultrasound were performed to evaluate the lower extremity deep venous systems from the level of the common femoral vein and including the common femoral, femoral, profunda femoral, popliteal and calf veins including the posterior tibial, peroneal and gastrocnemius veins when visible. The superficial great saphenous vein was also interrogated. Spectral Doppler was utilized to evaluate flow at rest and with distal augmentation maneuvers in the common femoral, femoral and popliteal veins.  COMPARISON:  09/30/2023 FINDINGS: Contralateral Common Femoral Vein: Respiratory phasicity is normal and symmetric with the symptomatic side. No evidence of thrombus. Normal compressibility. Common Femoral Vein: No evidence of thrombus. Normal compressibility, respiratory phasicity and response to augmentation. Saphenofemoral Junction: No evidence of thrombus. Normal compressibility and flow on color Doppler imaging. Profunda Femoral Vein: No evidence of thrombus. Normal compressibility and flow on color Doppler imaging. Femoral Vein: No evidence of thrombus. Normal compressibility, respiratory phasicity and response to augmentation. Popliteal Vein: No evidence of thrombus. Normal compressibility, respiratory  phasicity and response to augmentation. Calf Veins: No evidence of thrombus. Normal compressibility and flow on color Doppler imaging. Superficial Great Saphenous Vein: No evidence of thrombus. Normal compressibility. Other Findings:  None. IMPRESSION: Negative for deep venous thrombosis in the left leg. Electronically Signed   By: Rogelia Myers M.D.   On: 04/28/2024 17:59   DG Knee 2 Views Left Result Date: 04/28/2024 CLINICAL DATA:  Left knee pain and swelling. EXAM: LEFT KNEE - 1-2 VIEW COMPARISON:  None Available. FINDINGS: No evidence of fracture or dislocation. There may be a small joint effusion. No evidence of arthropathy or other focal bone abnormality. Mild soft tissue edema. IMPRESSION: Mild soft tissue edema and possible small joint effusion. No acute osseous abnormality. Electronically Signed   By: Andrea Gasman M.D.   On: 04/28/2024 17:20   DG Chest Portable 1 View Result Date: 04/28/2024 EXAM: 1 VIEW(S) XRAY OF THE CHEST 04/28/2024 03:49:47 PM COMPARISON: 02/24/2024 CLINICAL HISTORY: body pain, leukocytosis. pt states that she was here last night for the same thing, states that she is back for all over body aches and L leg swelling. FINDINGS: LUNGS AND PLEURA: No focal pulmonary  opacity. No pulmonary edema. No pleural effusion. No pneumothorax. HEART AND MEDIASTINUM: New aortic stent in place. BONES AND SOFT TISSUES: No acute osseous abnormality. IMPRESSION: 1. No acute cardiopulmonary process. 2. New aortic stent in place. Electronically signed by: Waddell Calk MD 04/28/2024 04:16 PM EDT RP Workstation: [id number]       Elgie Butter M.D. [facility] 04/29/2024, 3:57 PM  Available via Epic secure chat 7am-7pm After 7 pm, please refer to night coverage provider listed on amion.

## 2024-04-29 NOTE — Plan of Care (Signed)
  Problem: Acute Rehab PT Goals(only PT should resolve) Goal: Pt Will Go Supine/Side To Sit Outcome: Progressing Flowsheets (Taken 04/29/2024 1036) Pt will go Supine/Side to Sit: Independently Goal: Patient Will Transfer Sit To/From Stand Outcome: Progressing Flowsheets (Taken 04/29/2024 1036) Patient will transfer sit to/from stand: with modified independence Goal: Pt Will Transfer Bed To Chair/Chair To Bed Outcome: Progressing Flowsheets (Taken 04/29/2024 1036) Pt will Transfer Bed to Chair/Chair to Bed: with modified independence Goal: Pt Will Ambulate Outcome: Progressing Flowsheets (Taken 04/29/2024 1036) Pt will Ambulate:  50 feet  with modified independence  with least restrictive assistive device Goal: Pt Will Go Up/Down Stairs Outcome: Progressing Flowsheets (Taken 04/29/2024 1036) Pt will Go Up / Down Stairs:  3-5 stairs  with rail(s)  with supervision    10:36 AM, 04/29/24 Rosaria Settler, PT, DPT [place] with [facility]

## 2024-04-29 NOTE — TOC Initial Note (Addendum)
 Transition of Care [facility]) - Initial/Assessment Note    Patient Details  Name: Laurie Wallace MRN: [id number] Date of Birth: 07-Nov-1945  [facility] CM/SW Contact:    Lorraine LILLETTE Fenton, LCSW Phone Number: 04/29/2024, 2:16 PM  Clinical Narrative:                 CSW met with pt at bedside to deliver MOON, pt indicated that she understood.  PT recommended pt for SNF placement, CSW explored recommendation with pt, who was agreeable that she would like to DC to a SNF to regain strength.  When offered Choice pt indicated that she lives in the [place] area and would like to be at a location in that area. ICM will start SNF workup and follow.   Addendum: Call from Annie [phone]- niece of pt.  The aunt who was in room shared that pt wants family to visit (siblings) Preference [facility] a bed in [facility] not [place] as pt stated. Would like [facility].Additional bed offers sent.   Expected Discharge Plan: Home/Self Care Barriers to Discharge: Continued Medical Work up   Patient Goals and CMS Choice Patient states their goals for this hospitalization and ongoing recovery are:: Pt wants SNF as recommended by PT CMS Medicare.gov Compare Post Acute Care list provided to:: Patient Choice offered to / list presented to : Patient      Expected Discharge Plan and Services In-house Referral: Clinical Social Work                                            Prior Living Arrangements/Services   Lives with:: Adult Children   Do you feel safe going back to the place where you live?: Yes      Need for Family Participation in Patient Care: Yes (Comment) Care giver support system in place?: Yes (comment)      Activities of Daily Living   ADL Screening (condition at time of admission) Independently performs ADLs?: Yes (appropriate for developmental age) Is the patient deaf or have difficulty hearing?: No Does the patient have difficulty seeing, even  when wearing glasses/contacts?: No Does the patient have difficulty concentrating, remembering, or making decisions?: No  Permission Sought/Granted                  Emotional Assessment Appearance:: Appears older than stated age Attitude/Demeanor/Rapport: Guarded Affect (typically observed): Accepting Orientation: : Oriented to Self, Oriented to Place, Oriented to Situation Alcohol / Substance Use: Not Applicable    Admission diagnosis:  Weakness [R53.1] Generalized weakness [R53.1] Ambulatory dysfunction [R26.2] Acute pain of left knee [M25.562] Leukocytosis, unspecified type [D72.829] Patient Active Problem List   Diagnosis Date Noted   Weakness 04/28/2024   MPN (myeloproliferative neoplasm) (HCC) 04/13/2024   Monocytosis 04/13/2024   Leukocytosis 04/13/2024   S/P insertion of endovascular thoracic aortic stent graft 04/12/2024   Aortic thrombus (HCC) 04/12/2024   COVID-19 11/08/2023   Acute non-recurrent maxillary sinusitis 11/08/2023   Lupus anticoagulant positive 10/13/2023   Dizziness 10/13/2023   Pulmonary embolism (HCC) 09/29/2023   Pulmonary nodule 1 cm or greater in diameter 06/02/2023   Community acquired pneumonia of left lower lobe of lung 06/02/2023   Microcytic anemia 02/17/2023   Hospital discharge follow-up 01/18/2023   Gastroesophageal reflux disease 01/18/2023   Gait disturbance 01/18/2023   Nonintractable episodic headache 01/18/2023   Infectious colitis 01/10/2023  Hypokalemia 01/10/2023   Thrombocytosis 01/10/2023   Bilateral shoulder pain 12/22/2022   Localized swelling of right forearm 09/21/2022   Subcutaneous cyst 07/20/2022   Allergic sinusitis 01/14/2022   Encounter for general adult medical examination with abnormal findings 07/17/2021   Age-related osteoporosis without current pathological fracture 07/17/2021   Muscle cramps 02/28/2021   High risk medication use 12/26/2020   Seropositive rheumatoid arthritis (HCC) 10/25/2020    Moderate protein-calorie malnutrition 10/25/2020   Cervical spine pain 10/23/2020   Lumbar pain 10/23/2020   Other intervertebral disc displacement, lumbar region 04/07/2010   PCP:  [name], MD Pharmacy:   [facility] [place], [place] - [address] [place] [address] Phone: [phone] Fax: [phone]     Social Drivers of Health (SDOH) Social History: SDOH Screenings   Food Insecurity: No Food Insecurity (04/28/2024)  Housing: Low Risk  (04/28/2024)  Transportation Needs: No Transportation Needs (04/28/2024)  Utilities: Not At Risk (04/28/2024)  Alcohol Screen: Low Risk  (06/14/2023)  Depression (PHQ2-9): Medium Risk (11/08/2023)  Financial Resource Strain: Low Risk  (06/14/2023)  Physical Activity: Sufficiently Active (06/14/2023)  Social Connections: Moderately Isolated (04/28/2024)  Stress: No Stress Concern Present (06/14/2023)  Tobacco Use: Low Risk  (04/28/2024)  Health Literacy: Adequate Health Literacy (06/14/2023)   SDOH Interventions:     Readmission Risk Interventions    01/11/2023    2:07 PM  Readmission Risk Prevention Plan  Transportation Screening Complete  PCP or Specialist Appt within 5-7 Days Complete  Home Care Screening Complete  Medication Review (RN CM) Complete

## 2024-04-29 NOTE — Care Management Obs Status (Signed)
 MEDICARE OBSERVATION STATUS NOTIFICATION   Patient Details  Name: Laurie Wallace MRN: [id number] Date of Birth: Sep 22, 1945   Medicare Observation Status Notification Given:  Yes    Amariyana Heacox I Eben, LCSW 04/29/2024, 2:13 PM

## 2024-04-30 DIAGNOSIS — D471 Chronic myeloproliferative disease: Secondary | ICD-10-CM

## 2024-04-30 DIAGNOSIS — D75839 Thrombocytosis, unspecified: Secondary | ICD-10-CM

## 2024-04-30 DIAGNOSIS — I2694 Multiple subsegmental pulmonary emboli without acute cor pulmonale: Secondary | ICD-10-CM | POA: Diagnosis not present

## 2024-04-30 DIAGNOSIS — R531 Weakness: Secondary | ICD-10-CM | POA: Diagnosis not present

## 2024-04-30 MED ORDER — ONDANSETRON HCL 4 MG/2ML IJ SOLN
4.0000 mg | Freq: Three times a day (TID) | INTRAMUSCULAR | Status: DC | PRN
  Administered 2024-04-30 – 2024-05-04 (×5): 4 mg via INTRAVENOUS
  Filled 2024-04-30 (×5): qty 2

## 2024-04-30 NOTE — Hospital Course (Addendum)
 78 y.o. 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

## 2024-04-30 NOTE — Progress Notes (Signed)
 PROGRESS NOTE   Laurie Wallace  FMW:[id number] DOB: 1945-08-28 DOA: 04/28/2024 PCP: [name], MD   Chief Complaint  Patient presents with   Leg Pain   Level of care: Med-Surg  Brief Admission History:   78 y.o. female, with past medical history of COPD, rheumatoid arthritis, diagnosis of PE earlier in the year, and diagnosis of thrombosis status post endovascular stent placement 04/14/2024, patient undergoing workup for leukocytosis with concern of lymphoproliferative malignancy (CML versus CMML), which she missed most recent 2 appointment earlier this week due to not feeling well, and living by herself with family not aware she has appointment scheduled with oncology - Presents to ED secondary to generalized weakness, fatigue, left lower extremity pain, denies fever, chills, chest pain or shortness of breath, reports she is compliant with her Eliquis .   Assessment and Plan:  Lymphoproliferative malignancy Concern for CML versus CMML EDP discussed with oncology on-call Dr. Chloe recommended admission to the hospital for fluids and supportive care.  BCR-ABL is pending. Patient's WBC count has improved from 58,000-50K Pt says she has missed appts and has not established care with oncologist - will reach out to oncology tomorrow to see if anything further needs to be done inpatient prior to DC - pt strongly advised to stop missing appts with oncologist    Generalized weakness and deconditioning in the setting of Progressive lymphoproliferative malignancy Hydrate, repeat labs in the morning and therapy evaluation. Therapy evaluations recommending SNF, patient is agreeable and TOC is on board for placement 9 9   History of pulmonary embolism Continue with anticoagulation. history of recent aortic thrombosis status post insertion of endovascular thoracic aortic stent graft by Dr. Magda on 04/14/2024    COPD No wheezing heard on exam    Rheumatoid arthritis Continue  with methotrexate   DVT prophylaxis: apixaban  Code Status: Full  Family Communication: niece at bedside  Disposition: anticipating SNF placement    Consultants:   Procedures:   Antimicrobials:    Subjective: Pt says she aches in her bones all over  Objective: Vitals:   04/29/24 1255 04/29/24 1947 04/30/24 0354 04/30/24 1210  BP: (!) 111/48 (!) 126/51 (!) 124/55 (!) 140/49  Pulse: 91 94 100 100  Resp:  18 16 16   Temp: 98.1 F (36.7 C) 98.6 F (37 C) 98.5 F (36.9 C) 97.8 F (36.6 C)  TempSrc: Oral Oral Oral Oral  SpO2: 97% 98% 98% 100%  Weight:      Height:        Intake/Output Summary (Last 24 hours) at 04/30/2024 1502 Last data filed at 04/29/2024 1721 Gross per 24 hour  Intake 240 ml  Output --  Net 240 ml   Filed Weights   04/28/24 1522 04/28/24 2100  Weight: 54.4 kg 51.8 kg   Examination:  General exam: Appears calm and comfortable  Respiratory system: Clear to auscultation. Respiratory effort normal. Cardiovascular system: normal S1 & S2 heard. No JVD, murmurs, rubs, gallops or clicks. No pedal edema. Gastrointestinal system: Abdomen is nondistended, soft and nontender. No organomegaly or masses felt. Normal bowel sounds heard. Central nervous system: Alert and oriented. No focal neurological deficits. Extremities: Symmetric 5 x 5 power. Skin: No rashes, lesions or ulcers. Psychiatry: Judgement and insight appear normal. Mood & affect appropriate.   Data Reviewed: I have personally reviewed following labs and imaging studies  CBC: Recent Labs  Lab 04/27/24 1606 04/28/24 1532 04/29/24 0353  WBC 60.5* 58.5* 50.2*  NEUTROABS 30.3* 26.8* 27.6*  HGB 9.9* 8.3*  8.4*  HCT 31.8* 26.3* 26.9*  MCV 100.3* 101.5* 103.1*  PLT 829* 447* 810*    Basic Metabolic Panel: Recent Labs  Lab 04/27/24 1606 04/28/24 1532 04/28/24 1955 04/29/24 0353  NA 139 137  --  145  K 4.2 4.1  --  4.0  CL 100 101  --  108  CO2 23 20*  --  25  GLUCOSE 93 93  --  69*   BUN 11 11  --  7*  CREATININE 1.08* 1.12*  --  0.96  CALCIUM 9.5 9.1  --  8.6*  PHOS  --   --  3.7  --     CBG: No results for input(s): GLUCAP in the last 168 hours.  Recent Results (from the past 240 hours)  Resp panel by RT-PCR (RSV, Flu A&B, Covid) Anterior Nasal Swab     Status: None   Collection Time: 04/27/24  6:00 PM   Specimen: Anterior Nasal Swab  Result Value Ref Range Status   SARS Coronavirus 2 by RT PCR NEGATIVE NEGATIVE Final    Comment: (NOTE) SARS-CoV-2 target nucleic acids are NOT DETECTED.  The SARS-CoV-2 RNA is generally detectable in upper respiratory specimens during the acute phase of infection. The lowest concentration of SARS-CoV-2 viral copies this assay can detect is 138 copies/mL. A negative result does not preclude SARS-Cov-2 infection and should not be used as the sole basis for treatment or other patient management decisions. A negative result may occur with  improper specimen collection/handling, submission of specimen other than nasopharyngeal swab, presence of viral mutation(s) within the areas targeted by this assay, and inadequate number of viral copies(<138 copies/mL). A negative result must be combined with clinical observations, patient history, and epidemiological information. The expected result is Negative.  Fact Sheet for Patients:  bloggercourse.com  Fact Sheet for Healthcare Providers:  seriousbroker.it  This test is no t yet approved or cleared by the [place]  FDA and  has been authorized for detection and/or diagnosis of SARS-CoV-2 by FDA under an Emergency Use Authorization (EUA). This EUA will remain  in effect (meaning this test can be used) for the duration of the COVID-19 declaration under Section 564(b)(1) of the Act, 21 U.S.C.section 360bbb-3(b)(1), unless the authorization is terminated  or revoked sooner.       Influenza A by PCR NEGATIVE NEGATIVE Final    Influenza B by PCR NEGATIVE NEGATIVE Final    Comment: (NOTE) The Xpert Xpress SARS-CoV-2/FLU/RSV plus assay is intended as an aid in the diagnosis of influenza from Nasopharyngeal swab specimens and should not be used as a sole basis for treatment. Nasal washings and aspirates are unacceptable for Xpert Xpress SARS-CoV-2/FLU/RSV testing.  Fact Sheet for Patients: bloggercourse.com  Fact Sheet for Healthcare Providers: seriousbroker.it  This test is not yet approved or cleared by the [place]  FDA and has been authorized for detection and/or diagnosis of SARS-CoV-2 by FDA under an Emergency Use Authorization (EUA). This EUA will remain in effect (meaning this test can be used) for the duration of the COVID-19 declaration under Section 564(b)(1) of the Act, 21 U.S.C. section 360bbb-3(b)(1), unless the authorization is terminated or revoked.     Resp Syncytial Virus by PCR NEGATIVE NEGATIVE Final    Comment: (NOTE) Fact Sheet for Patients: bloggercourse.com  Fact Sheet for Healthcare Providers: seriousbroker.it  This test is not yet approved or cleared by the [place]  FDA and has been authorized for detection and/or diagnosis of SARS-CoV-2 by FDA under an Emergency Use  Authorization (EUA). This EUA will remain in effect (meaning this test can be used) for the duration of the COVID-19 declaration under Section 564(b)(1) of the Act, 21 U.S.C. section 360bbb-3(b)(1), unless the authorization is terminated or revoked.  Performed at [facility], [address] [place], [place] [address]      Radiology Studies: US  Venous Img Lower  Left (DVT Study) Result Date: 04/28/2024 CLINICAL DATA:  L leg swellingand pain EXAM: LEFT LOWER EXTREMITY VENOUS DOPPLER ULTRASOUND TECHNIQUE: Gray-scale sonography with graded compression, as well as color Doppler and duplex ultrasound were  performed to evaluate the lower extremity deep venous systems from the level of the common femoral vein and including the common femoral, femoral, profunda femoral, popliteal and calf veins including the posterior tibial, peroneal and gastrocnemius veins when visible. The superficial great saphenous vein was also interrogated. Spectral Doppler was utilized to evaluate flow at rest and with distal augmentation maneuvers in the common femoral, femoral and popliteal veins. COMPARISON:  09/30/2023 FINDINGS: Contralateral Common Femoral Vein: Respiratory phasicity is normal and symmetric with the symptomatic side. No evidence of thrombus. Normal compressibility. Common Femoral Vein: No evidence of thrombus. Normal compressibility, respiratory phasicity and response to augmentation. Saphenofemoral Junction: No evidence of thrombus. Normal compressibility and flow on color Doppler imaging. Profunda Femoral Vein: No evidence of thrombus. Normal compressibility and flow on color Doppler imaging. Femoral Vein: No evidence of thrombus. Normal compressibility, respiratory phasicity and response to augmentation. Popliteal Vein: No evidence of thrombus. Normal compressibility, respiratory phasicity and response to augmentation. Calf Veins: No evidence of thrombus. Normal compressibility and flow on color Doppler imaging. Superficial Great Saphenous Vein: No evidence of thrombus. Normal compressibility. Other Findings:  None. IMPRESSION: Negative for deep venous thrombosis in the left leg. Electronically Signed   By: Rogelia Myers M.D.   On: 04/28/2024 17:59   DG Knee 2 Views Left Result Date: 04/28/2024 CLINICAL DATA:  Left knee pain and swelling. EXAM: LEFT KNEE - 1-2 VIEW COMPARISON:  None Available. FINDINGS: No evidence of fracture or dislocation. There may be a small joint effusion. No evidence of arthropathy or other focal bone abnormality. Mild soft tissue edema. IMPRESSION: Mild soft tissue edema and possible small  joint effusion. No acute osseous abnormality. Electronically Signed   By: Andrea Gasman M.D.   On: 04/28/2024 17:20   DG Chest Portable 1 View Result Date: 04/28/2024 EXAM: 1 VIEW(S) XRAY OF THE CHEST 04/28/2024 03:49:47 PM COMPARISON: 02/24/2024 CLINICAL HISTORY: body pain, leukocytosis. pt states that she was here last night for the same thing, states that she is back for all over body aches and L leg swelling. FINDINGS: LUNGS AND PLEURA: No focal pulmonary opacity. No pulmonary edema. No pleural effusion. No pneumothorax. HEART AND MEDIASTINUM: New aortic stent in place. BONES AND SOFT TISSUES: No acute osseous abnormality. IMPRESSION: 1. No acute cardiopulmonary process. 2. New aortic stent in place. Electronically signed by: Waddell Calk MD 04/28/2024 04:16 PM EDT RP Workstation: [id number]    Scheduled Meds:  apixaban   5 mg Oral BID   aspirin EC  81 mg Oral Daily   famotidine   20 mg Oral Daily   folic acid   1 mg Oral Daily   loratadine   5 mg Oral Daily   Continuous Infusions:   LOS: 0 days   Time spent: 56 mins  Rashan Rounsaville Vicci, MD How to contact the [facility] Attending or Consulting provider 7A - 7P or covering provider during after hours 7P -7A, for this patient?  Check the  care team in [facility] and look for a) attending/consulting [facility] provider listed and b) the [facility] team listed Log into [email or web address] to find provider on call.  Locate the [facility] provider you are looking for under [facility] and page to a number that you can be directly reached. If you still have difficulty reaching the provider, please page the [facility] (Director on Call) for the Hospitalists listed on amion for assistance.  04/30/2024, 3:02 PM

## 2024-04-30 NOTE — Plan of Care (Signed)

## 2024-04-30 NOTE — Plan of Care (Signed)

## 2024-05-01 ENCOUNTER — Observation Stay (HOSPITAL_COMMUNITY)

## 2024-05-01 DIAGNOSIS — I2694 Multiple subsegmental pulmonary emboli without acute cor pulmonale: Secondary | ICD-10-CM | POA: Diagnosis not present

## 2024-05-01 DIAGNOSIS — R531 Weakness: Secondary | ICD-10-CM

## 2024-05-01 DIAGNOSIS — J449 Chronic obstructive pulmonary disease, unspecified: Secondary | ICD-10-CM | POA: Diagnosis not present

## 2024-05-01 DIAGNOSIS — D75839 Thrombocytosis, unspecified: Secondary | ICD-10-CM | POA: Diagnosis not present

## 2024-05-01 DIAGNOSIS — M069 Rheumatoid arthritis, unspecified: Secondary | ICD-10-CM | POA: Diagnosis not present

## 2024-05-01 DIAGNOSIS — R911 Solitary pulmonary nodule: Secondary | ICD-10-CM | POA: Diagnosis not present

## 2024-05-01 DIAGNOSIS — D471 Chronic myeloproliferative disease: Secondary | ICD-10-CM | POA: Diagnosis not present

## 2024-05-01 DIAGNOSIS — R509 Fever, unspecified: Secondary | ICD-10-CM | POA: Diagnosis not present

## 2024-05-01 LAB — CBC WITH DIFFERENTIAL/PLATELET
Abs Immature Granulocytes: 22.8 K/uL — ABNORMAL HIGH (ref 0.00–0.07)
Band Neutrophils: 1 %
Basophils Absolute: 0 K/uL (ref 0.0–0.1)
Basophils Relative: 0 %
Blasts: 3 %
Eosinophils Absolute: 0 K/uL (ref 0.0–0.5)
Eosinophils Relative: 0 %
HCT: 25.6 % — ABNORMAL LOW (ref 36.0–46.0)
Hemoglobin: 8.2 g/dL — ABNORMAL LOW (ref 12.0–15.0)
Lymphocytes Relative: 22 %
Lymphs Abs: 19.3 K/uL — ABNORMAL HIGH (ref 0.7–4.0)
MCH: 32.8 pg (ref 26.0–34.0)
MCHC: 32 g/dL (ref 30.0–36.0)
MCV: 102.4 fL — ABNORMAL HIGH (ref 80.0–100.0)
Metamyelocytes Relative: 7 %
Monocytes Absolute: 7.9 K/uL — ABNORMAL HIGH (ref 0.1–1.0)
Monocytes Relative: 9 %
Myelocytes: 8 %
Neutro Abs: 35.1 K/uL — ABNORMAL HIGH (ref 1.7–7.7)
Neutrophils Relative %: 39 %
Platelets: 1212 K/uL (ref 150–400)
Promyelocytes Relative: 11 %
RBC: 2.5 MIL/uL — ABNORMAL LOW (ref 3.87–5.11)
RDW: 20.4 % — ABNORMAL HIGH (ref 11.5–15.5)
WBC: 87.8 K/uL (ref 4.0–10.5)
nRBC: 0.2 % (ref 0.0–0.2)

## 2024-05-01 LAB — PROCALCITONIN: Procalcitonin: 0.23 ng/mL

## 2024-05-01 LAB — BASIC METABOLIC PANEL WITH GFR
Anion gap: 13 (ref 5–15)
BUN: <5 mg/dL — ABNORMAL LOW (ref 8–23)
CO2: 25 mmol/L (ref 22–32)
Calcium: 8.9 mg/dL (ref 8.9–10.3)
Chloride: 107 mmol/L (ref 98–111)
Creatinine, Ser: 0.95 mg/dL (ref 0.44–1.00)
GFR, Estimated: >60 mL/min (ref >60)
Glucose, Bld: 94 mg/dL (ref 70–99)
Potassium: 3.7 mmol/L (ref 3.5–5.1)
Sodium: 145 mmol/L (ref 135–145)

## 2024-05-01 LAB — VITAMIN B12: Vitamin B-12: 1469 pg/mL — ABNORMAL HIGH (ref 180–914)

## 2024-05-01 LAB — FOLATE: Folate: >20 ng/mL (ref >5.9)

## 2024-05-01 MED ORDER — METHOTREXATE SODIUM 2.5 MG PO TABS
15.0000 mg | ORAL_TABLET | ORAL | Status: DC
  Administered 2024-05-02 – 2024-05-09 (×2): 15 mg via ORAL
  Filled 2024-05-01 (×2): qty 6

## 2024-05-01 MED ORDER — PROCHLORPERAZINE EDISYLATE 10 MG/2ML IJ SOLN
10.0000 mg | INTRAMUSCULAR | Status: DC | PRN
  Administered 2024-05-01: 10 mg via INTRAVENOUS
  Filled 2024-05-01: qty 2

## 2024-05-01 MED ORDER — HYDROXYUREA 500 MG PO CAPS
500.0000 mg | ORAL_CAPSULE | Freq: Two times a day (BID) | ORAL | Status: DC
  Administered 2024-05-01 – 2024-05-09 (×15): 500 mg via ORAL
  Filled 2024-05-01 (×16): qty 1

## 2024-05-01 MED ORDER — MORPHINE SULFATE (PF) 2 MG/ML IV SOLN: 1.0000 mg | INTRAVENOUS | Status: DC | PRN

## 2024-05-01 NOTE — Consult Note (Signed)
 [facility] Consultation Oncology   REASON FOR CONSULT: Leukocytosis and thrombocytosis    HISTORY OF PRESENT ILLNESS:   Laurie Wallace is a 78 y.o. female with past medical history of COPD,  Rheumatoid arthritis, diagnosis of PE, aortic thrombosis s/p thrombectomy and suspicion for lymphoproliferative disorder [CML versus CMML].  Patient presented to the ER with generalized weakness, bilateral upper extremity and lower extremity pain.  She denied fever, chills, shortness of breath.  Hematology was consulted for elevated white blood cell and platelets.  Patient was seen at bedside with her niece.  She reported that her pain is significantly better.  She denies headache, visual changes, fever, chills, weight loss or loss of appetite.  She was recently admitted to [facility] long hospital for aortic thrombus and had a thrombectomy and is taking Eliquis  every day.  She was supposed to see me outpatient but forgot about her appointment that day and did not show up.  Patient was being worked up for lymphoproliferative disorder CML versus CMML.  Her BCR-ABL is pending at this time.  She had a flow cytometry done on 04/13/2024 that showed increased blasts.  She is scheduled to get a bone marrow biopsy done tomorrow.  Since her discharge from the hospital, patient has been less functionally active compared to prior.  She still continues to live by herself.   MEDICATIONS: I have reviewed the patient's current medications.       PERFORMANCE STATUS: The patient's performance status is 2 - Symptomatic, <50% confined to bed  PHYSICAL EXAM: Most Recent Vital Signs: Blood pressure (!) 134/103, pulse 92, temperature 97.8 F (36.6 C), temperature source Oral, resp. rate 12, height 6' (1.829 m), weight 270 lb (122.5 kg), SpO2 91%.  GENERAL:alert, no distress and comfortable SKIN: skin color, texture, turgor are normal, no rashes or significant lesions LYMPH:  no palpable lymphadenopathy in  the cervical, axillary or inguinal LUNGS: clear to auscultation and percussion with normal breathing effort HEART: regular rate & rhythm and no murmurs and no lower extremity edema ABDOMEN:abdomen soft, non-tender and normal bowel sounds Musculoskeletal:no cyanosis of digits and no clubbing  PSYCH: alert & oriented x 3 with fluent speech NEURO: no focal motor/sensory deficits   LABORATORY DATA:   Last CBC Lab Results  Component Value Date   WBC 87.8 (HH) 05/01/2024   HGB 8.2 (L) 05/01/2024   HCT 25.6 (L) 05/01/2024   MCV 102.4 (H) 05/01/2024   MCH 32.8 05/01/2024   RDW 20.4 (H) 05/01/2024   PLT 1,212 (HH) 05/01/2024     Last metabolic panel Lab Results  Component Value Date   GLUCOSE 94 05/01/2024   NA 145 05/01/2024   K 3.7 05/01/2024   CL 107 05/01/2024   CO2 25 05/01/2024   BUN <5 (L) 05/01/2024   CREATININE 0.95 05/01/2024   GFRNONAA >60 05/01/2024   CALCIUM 8.9 05/01/2024   PHOS 3.7 04/28/2024   PROT 7.4 04/28/2024   ALBUMIN 4.2 04/28/2024   LABGLOB 2.4 04/14/2024   AGRATIO 1.2 10/17/2020   BILITOT 0.5 04/28/2024   ALKPHOS 112 04/28/2024   AST 30 04/28/2024   ALT 9 04/28/2024   ANIONGAP 13 05/01/2024    Latest Reference Range & Units 04/28/24 19:55  Prothrombin Time 11.4 - 15.2 seconds 24.2 (H)  INR 0.8 - 1.2  2.1 (H)  (H): Data is abnormally high  Flow cytometry: DIAGNOSIS:   - Abnormal immature population identified.  See comment.   COMMENT:  Flow cytometric analysis identified an abnormal  CD34 positive blast  population constituting 11% of all cells analyzed.  The blasts are  positive for CD5, CD33, CD38, CD117 and HLA-DR.  A complete hematologic  evaluation is recommended for further assessment.    GATING AND PHENOTYPIC ANALYSIS:   Gated population: Flow cytometric immunophenotyping is performed using  antibodies to the antigens listed in the table below. Electronic gates  are placed around a cell cluster displaying light scatter properties   corresponding to: blasts   Abnormal Cells in sample: 11%   Phenotype of Abnormal Cells: CD5, CD33, CD34, CD38, CD117, HLA-Dr   Pathology smear review: Leukocytosis with left shift including blasts, promyelocytes and myeloid   Latest Reference Range & Units 04/14/24 03:44  b2a2 transcript % <0.0032  b3a2 transcript % <0.0032  E1A2 Transcript % <0.0032  Interpretation (BCRAL):  Negative   JAK2/E 12-15/CALR/MPL: Negative   Latest Reference Range & Units 04/14/24 03:44  Total Protein ELP 6.0 - 8.5 g/dL 5.7 (L) (C)  Albumin SerPl Elph-Mcnc 2.9 - 4.4 g/dL 3.3 (C)  Albumin/Glob SerPl 0.7 - 1.7  1.4 (C)  Alpha2 Glob SerPl Elph-Mcnc 0.4 - 1.0 g/dL 0.6 (C)  Alpha 1 0.0 - 0.4 g/dL 0.3 (C)  Gamma Glob SerPl Elph-Mcnc 0.4 - 1.8 g/dL 0.8 (C)  M Protein SerPl Elph-Mcnc Not Observed g/dL Not Observed (C)  IFE 1  Polyclonal increase  Globulin, Total 2.2 - 3.9 g/dL 2.4 (C)  B-Globulin SerPl Elph-Mcnc 0.7 - 1.3 g/dL 0.7 (C)  IgG (Immunoglobin G), Serum 586 - 1,602 mg/dL 246  IgM (Immunoglobulin M), Srm 26 - 217 mg/dL 723 (H)  IgA 64 - 577 mg/dL 807  (L): Data is abnormally low !: Data is abnormal (H): Data is abnormally high (C): Corrected   Latest Reference Range & Units 04/14/24 03:44  Kappa free light chain 3.3 - 19.4 mg/L 24.1 (H)  Lambda free light chains 5.7 - 26.3 mg/L 25.5  Kappa, lambda light chain ratio 0.26 - 1.65  0.95  (H): Data is abnormally high  RADIOGRAPHY:  US  Venous Img Lower  Left (DVT Study) CLINICAL DATA:  L leg swellingand pain  EXAM: LEFT LOWER EXTREMITY VENOUS DOPPLER ULTRASOUND  TECHNIQUE: Gray-scale sonography with graded compression, as well as color Doppler and duplex ultrasound were performed to evaluate the lower extremity deep venous systems from the level of the common femoral vein and including the common femoral, femoral, profunda femoral, popliteal and calf veins including the posterior tibial, peroneal and gastrocnemius veins when visible.  The superficial great saphenous vein was also interrogated. Spectral Doppler was utilized to evaluate flow at rest and with distal augmentation maneuvers in the common femoral, femoral and popliteal veins.  COMPARISON:  09/30/2023  FINDINGS: Contralateral Common Femoral Vein: Respiratory phasicity is normal and symmetric with the symptomatic side. No evidence of thrombus. Normal compressibility.  Common Femoral Vein: No evidence of thrombus. Normal compressibility, respiratory phasicity and response to augmentation.  Saphenofemoral Junction: No evidence of thrombus. Normal compressibility and flow on color Doppler imaging.  Profunda Femoral Vein: No evidence of thrombus. Normal compressibility and flow on color Doppler imaging.  Femoral Vein: No evidence of thrombus. Normal compressibility, respiratory phasicity and response to augmentation.  Popliteal Vein: No evidence of thrombus. Normal compressibility, respiratory phasicity and response to augmentation.  Calf Veins: No evidence of thrombus. Normal compressibility and flow on color Doppler imaging.  Superficial Great Saphenous Vein: No evidence of thrombus. Normal compressibility.  Other Findings:  None.  IMPRESSION: Negative for deep venous thrombosis in the  left leg.  Electronically Signed   By: Rogelia Myers M.D.   On: 04/28/2024 17:59 DG Knee 2 Views Left CLINICAL DATA:  Left knee pain and swelling.  EXAM: LEFT KNEE - 1-2 VIEW  COMPARISON:  None Available.  FINDINGS: No evidence of fracture or dislocation. There may be a small joint effusion. No evidence of arthropathy or other focal bone abnormality. Mild soft tissue edema.  IMPRESSION: Mild soft tissue edema and possible small joint effusion. No acute osseous abnormality.  Electronically Signed   By: Andrea Gasman M.D.   On: 04/28/2024 17:20 DG Chest Portable 1 View EXAM: 1 VIEW(S) XRAY OF THE CHEST 04/28/2024 03:49:47  PM  COMPARISON: 02/24/2024  CLINICAL HISTORY: body pain, leukocytosis. pt states that she was here last night for the same thing, states that she is back for all over body aches and L leg swelling.  FINDINGS:  LUNGS AND PLEURA: No focal pulmonary opacity. No pulmonary edema. No pleural effusion. No pneumothorax.  HEART AND MEDIASTINUM: New aortic stent in place.  BONES AND SOFT TISSUES: No acute osseous abnormality.  IMPRESSION: 1. No acute cardiopulmonary process. 2. New aortic stent in place.  Electronically signed by: Waddell Calk MD 04/28/2024 04:16 PM EDT RP Workstation: [id number]      ASSESSMENT: Patient is a 78 year old female with multiple VTE with concerns for possible lymphoproliferative disorder.  Hematology consulted for the same.  PLAN:   1.  Leukocytosis and thrombocytosis Likely lymphoproliferative disorder like CMML versus CML.  BCR-ABL negative.  JAK2 mutation testing: Negative Worsening leukocytosis and thrombocytosis  - Agree with bone marrow biopsy to confirm diagnosis.  Please send cytogenetics and MPN FISH panel along with the pathology. - Please start hydroxyurea 500 mg twice daily. - Will repeat flow cytometry.  Ordered with a.m. labs - Will obtain LDH, uric acid, von Willebrand panel to rule out acquired von Willebrand disease, EPO level with a.m. labs. - If patient is stable after biopsy, can discharge patient.  We will follow her up outpatient.  2.  Anemia Likely due to lymphoproliferative disorder Anemia panel within normal limits.  - Continue to monitor - Transfuse for hemoglobin less than 7   Thank you for involving us  in this patient's care.  Please to reach out with any questions or concerns.  Hematology will continue to follow.  Mickiel Dry, MD Hematology/Oncology [facility]

## 2024-05-01 NOTE — Progress Notes (Addendum)
 PROGRESS NOTE   Laurie Wallace  FMW:[id number] DOB: 05-28-1946 DOA: 04/28/2024 PCP: [name], MD   Chief Complaint  Patient presents with   Leg Pain   Level of care: Med-Surg  Brief Admission History:   78 y.o. female, with past medical history of COPD, rheumatoid arthritis, diagnosis of PE earlier in the year, and diagnosis of thrombosis status post endovascular stent placement 04/14/2024, patient undergoing workup for leukocytosis with concern of lymphoproliferative malignancy (CML versus CMML), which she missed most recent 2 appointment earlier this week due to not feeling well, and living by herself with family not aware she has appointment scheduled with oncology - Presents to ED secondary to generalized weakness, fatigue, left lower extremity pain, denies fever, chills, chest pain or shortness of breath, reports she is compliant with her Eliquis .   Assessment and Plan:  Lymphoproliferative malignancy Leukocytosis and thrombocytosis Concern for CML versus CMML WBC up to 85K today and platelets up to 1200, discussed with Dr. Davonna who agreed to consult  Pt says she has missed appts and has not established care with oncologist - discussed with Dr. Davonna and requesting IR guided bone marrow biopsy which is planned at [facility] tomorrow per IR  Macrocytic anemia -- check B12, folate   Fever - suspect related to suspected malignancy - follow and treat supportively    Generalized weakness and deconditioning in the setting of Progressive lymphoproliferative malignancy Hydrate, repeat labs in the morning and therapy evaluation. Therapy evaluations recommending SNF, patient is agreeable and TOC is on board for placement   History of pulmonary embolism Continue with anticoagulation - apixaban  5 mg BID  history of recent aortic thrombosis status post insertion of endovascular thoracic aortic stent graft by Dr. Magda on 04/14/2024    COPD No wheezing Bronchodilators  PRN    Rheumatoid arthritis Continue with methotrexate  15 mg weekly (Tuesdays)  DVT prophylaxis: apixaban  Code Status: Full  Family Communication: niece at bedside 10/26  Disposition: anticipating SNF placement    Consultants:   Procedures:   Antimicrobials:    Subjective: Pt reports that her pain is better controlled.    Objective: Vitals:   04/30/24 0354 04/30/24 1210 04/30/24 1930 05/01/24 0521  BP: (!) 124/55 (!) 140/49 (!) 127/51 (!) 125/55  Pulse: 100 100 99 97  Resp: 16 16 20 18   Temp: 98.5 F (36.9 C) 97.8 F (36.6 C) (!) 100.7 F (38.2 C) 99.5 F (37.5 C)  TempSrc: Oral Oral Oral Oral  SpO2: 98% 100% 96% 97%  Weight:      Height:        Intake/Output Summary (Last 24 hours) at 05/01/2024 1239 Last data filed at 05/01/2024 0957 Gross per 24 hour  Intake 720 ml  Output --  Net 720 ml   Filed Weights   04/28/24 1522 04/28/24 2100  Weight: 54.4 kg 51.8 kg   Examination:  General exam: Appears calm and comfortable  Respiratory system: Clear to auscultation. Respiratory effort normal. Cardiovascular system: normal S1 & S2 heard. No JVD, murmurs, rubs, gallops or clicks. No pedal edema. Gastrointestinal system: Abdomen is nondistended, soft and nontender. No organomegaly or masses felt. Normal bowel sounds heard. Central nervous system: Alert and oriented. No focal neurological deficits. Extremities: Symmetric 5 x 5 power. Skin: No rashes, lesions or ulcers. Psychiatry: Judgement and insight appear normal. Mood & affect appropriate.   Data Reviewed: I have personally reviewed following labs and imaging studies  CBC: Recent Labs  Lab 04/27/24 1606 04/28/24 1532 04/29/24  0353 05/01/24 0450  WBC 60.5* 58.5* 50.2* 87.8*  NEUTROABS 30.3* 26.8* 27.6* 35.1*  HGB 9.9* 8.3* 8.4* 8.2*  HCT 31.8* 26.3* 26.9* 25.6*  MCV 100.3* 101.5* 103.1* 102.4*  PLT 829* 447* 810* 1,212*    Basic Metabolic Panel: Recent Labs  Lab 04/27/24 1606 04/28/24 1532  04/28/24 1955 04/29/24 0353 05/01/24 0450  NA 139 137  --  145 145  K 4.2 4.1  --  4.0 3.7  CL 100 101  --  108 107  CO2 23 20*  --  25 25  GLUCOSE 93 93  --  69* 94  BUN 11 11  --  7* <5*  CREATININE 1.08* 1.12*  --  0.96 0.95  CALCIUM 9.5 9.1  --  8.6* 8.9  PHOS  --   --  3.7  --   --     CBG: No results for input(s): GLUCAP in the last 168 hours.  Recent Results (from the past 240 hours)  Resp panel by RT-PCR (RSV, Flu A&B, Covid) Anterior Nasal Swab     Status: None   Collection Time: 04/27/24  6:00 PM   Specimen: Anterior Nasal Swab  Result Value Ref Range Status   SARS Coronavirus 2 by RT PCR NEGATIVE NEGATIVE Final    Comment: (NOTE) SARS-CoV-2 target nucleic acids are NOT DETECTED.  The SARS-CoV-2 RNA is generally detectable in upper respiratory specimens during the acute phase of infection. The lowest concentration of SARS-CoV-2 viral copies this assay can detect is 138 copies/mL. A negative result does not preclude SARS-Cov-2 infection and should not be used as the sole basis for treatment or other patient management decisions. A negative result may occur with  improper specimen collection/handling, submission of specimen other than nasopharyngeal swab, presence of viral mutation(s) within the areas targeted by this assay, and inadequate number of viral copies(<138 copies/mL). A negative result must be combined with clinical observations, patient history, and epidemiological information. The expected result is Negative.  Fact Sheet for Patients:  bloggercourse.com  Fact Sheet for Healthcare Providers:  seriousbroker.it  This test is no t yet approved or cleared by the [place]  FDA and  has been authorized for detection and/or diagnosis of SARS-CoV-2 by FDA under an Emergency Use Authorization (EUA). This EUA will remain  in effect (meaning this test can be used) for the duration of the COVID-19  declaration under Section 564(b)(1) of the Act, 21 U.S.C.section 360bbb-3(b)(1), unless the authorization is terminated  or revoked sooner.       Influenza A by PCR NEGATIVE NEGATIVE Final   Influenza B by PCR NEGATIVE NEGATIVE Final    Comment: (NOTE) The Xpert Xpress SARS-CoV-2/FLU/RSV plus assay is intended as an aid in the diagnosis of influenza from Nasopharyngeal swab specimens and should not be used as a sole basis for treatment. Nasal washings and aspirates are unacceptable for Xpert Xpress SARS-CoV-2/FLU/RSV testing.  Fact Sheet for Patients: bloggercourse.com  Fact Sheet for Healthcare Providers: seriousbroker.it  This test is not yet approved or cleared by the [place]  FDA and has been authorized for detection and/or diagnosis of SARS-CoV-2 by FDA under an Emergency Use Authorization (EUA). This EUA will remain in effect (meaning this test can be used) for the duration of the COVID-19 declaration under Section 564(b)(1) of the Act, 21 U.S.C. section 360bbb-3(b)(1), unless the authorization is terminated or revoked.     Resp Syncytial Virus by PCR NEGATIVE NEGATIVE Final    Comment: (NOTE) Fact Sheet for Patients: bloggercourse.com  Fact Sheet for Healthcare Providers: seriousbroker.it  This test is not yet approved or cleared by the [place]  FDA and has been authorized for detection and/or diagnosis of SARS-CoV-2 by FDA under an Emergency Use Authorization (EUA). This EUA will remain in effect (meaning this test can be used) for the duration of the COVID-19 declaration under Section 564(b)(1) of the Act, 21 U.S.C. section 360bbb-3(b)(1), unless the authorization is terminated or revoked.  Performed at [facility], [address] [place], [place] [address]    Radiology Studies: No results found.  Scheduled Meds:  apixaban   5 mg Oral BID   aspirin  EC  81 mg Oral Daily   famotidine   20 mg Oral Daily   folic acid   1 mg Oral Daily   loratadine   5 mg Oral Daily   Continuous Infusions:   LOS: 0 days   Time spent: 55 mins  Maribel Hadley Vicci, MD How to contact the [facility] Attending or Consulting provider 7A - 7P or covering provider during after hours 7P -7A, for this patient?  Check the care team in [facility] and look for a) attending/consulting TRH provider listed and b) the TRH team listed Log into [email or web address] to find provider on call.  Locate the TRH provider you are looking for under [facility] and page to a number that you can be directly reached. If you still have difficulty reaching the provider, please page the [facility] (Director on Call) for the Hospitalists listed on amion for assistance.  05/01/2024, 12:39 PM

## 2024-05-01 NOTE — Plan of Care (Signed)
 ?  Problem: Coping: ?Goal: Level of anxiety will decrease ?Outcome: Progressing ?  ?Problem: Safety: ?Goal: Ability to remain free from injury will improve ?Outcome: Progressing ?  ?

## 2024-05-01 NOTE — Plan of Care (Signed)
  Problem: Acute Rehab OT Goals (only OT should resolve) Goal: Pt. Will Perform Grooming Flowsheets (Taken 05/01/2024 1231) Pt Will Perform Grooming:  with modified independence  standing Goal: Pt. Will Perform Upper Body Dressing Flowsheets (Taken 05/01/2024 1231) Pt Will Perform Upper Body Dressing: with modified independence Goal: Pt. Will Perform Lower Body Dressing Flowsheets (Taken 05/01/2024 1231) Pt Will Perform Lower Body Dressing: with modified independence Goal: Pt. Will Transfer To Toilet Flowsheets (Taken 05/01/2024 1231) Pt Will Transfer to Toilet:  with modified independence  ambulating Goal: Pt. Will Perform Toileting-Clothing Manipulation Flowsheets (Taken 05/01/2024 1231) Pt Will Perform Toileting - Clothing Manipulation and hygiene: with modified independence Goal: Pt/Caregiver Will Perform Home Exercise Program Flowsheets (Taken 05/01/2024 1231) Pt/caregiver will Perform Home Exercise Program:  Increased strength  Increased ROM  Independently  Both right and left upper extremity  Etha Stambaugh OT, MOT

## 2024-05-01 NOTE — Plan of Care (Signed)

## 2024-05-01 NOTE — Evaluation (Signed)
 Occupational Therapy Evaluation Patient Details Name: Laurie Wallace MRN: [id number] DOB: Oct 31, 1945 Today's Date: 05/01/2024   History of Present Illness   Laurie Wallace  is a 78 y.o. female, with past medical history of COPD, rheumatoid arthritis, diagnosis of PE earlier in the year, and diagnosis of thrombosis status post endovascular stent placement   04/14/2024, patient undergoing workup for leukocytosis with concern of lymphoproliferative malignancy (CML versus CMML), which she missed most recent 2 appointment earlier this week due to not feeling well, and living by herself with family not aware she has appointment scheduled with oncology  - Presents to ED secondary to generalized weakness, fatigue, left lower extremity pain, denies fever, chills, chest pain or shortness of breath, reports she is compliant with her Eliquis .  - ED her workup significant for significant leukocytosis at 58K, anemia with hemoglobin of 8.3, platelet count at 447K, with absolute granulocyte at 17K, INR at 2.1, venous Dopplers negative for left lower extremity edema, x-ray significant for left knee soft tissue edema with possible small knee effusion, ED discussed with oncology on-call who recommended admission for hydration, PT, OT and supportive care. (per MD)     Clinical Impressions Pt agreeable to OT evaluation. Pt is independent at baseline, but today demonstrated significant B LE pain with mobility. Mod A needed for bed mobility with increased pain. Pt able to stand, but required use of RW and min A. Pt able to take L lateral steps with RW and min to mod A. Much increased pain and leaning anteriorly. Pt unable to manage socks today indicating need for max to total assist for lower body ADL's at this time. Generally weak with mild limitation of shoulder flexion A/ROM. Pt left back in bed with call bell within reach, bed alarm set, and family present. Pt will benefit from continued OT in the hospital  to increase strength, balance, and endurance for safe ADL's.        If plan is discharge home, recommend the following:   A lot of help with walking and/or transfers;A lot of help with bathing/dressing/bathroom;Assistance with cooking/housework;Assist for transportation;Help with stairs or ramp for entrance     Functional Status Assessment   Patient has had a recent decline in their functional status and demonstrates the ability to make significant improvements in function in a reasonable and predictable amount of time.     Equipment Recommendations   None recommended by OT             Precautions/Restrictions   Precautions Precautions: Fall Recall of Precautions/Restrictions: Intact Restrictions Weight Bearing Restrictions Per Provider Order: No     Mobility Bed Mobility Overal bed mobility: Needs Assistance Bed Mobility: Supine to Sit, Sit to Supine     Supine to sit: Mod assist Sit to supine: Mod assist   General bed mobility comments: Labored; assist to move B  LE to EOB and back into bed. Single hand held assist also needed. Increased pain noted as well with mobility.    Transfers Overall transfer level: Needs assistance Equipment used: Rolling walker (2 wheels) Transfers: Sit to/from Stand Sit to Stand: Min assist           General transfer comment: Pt unable to stand without use of RW. Min A needed for sit to stand from EOB with RW followed by min to mod A for L lateral steps to head of bed. Pt struggled to move RW to L side rather than forward.      Balance Overall  balance assessment: Needs assistance Sitting-balance support: No upper extremity supported, Feet supported Sitting balance-Leahy Scale: Fair Sitting balance - Comments: seated at EOB   Standing balance support: Bilateral upper extremity supported, During functional activity, Reliant on assistive device for balance Standing balance-Leahy Scale: Poor Standing balance comment:  poor to fair with RW                           ADL either performed or assessed with clinical judgement   ADL Overall ADL's : Needs assistance/impaired     Grooming: Contact guard assist;Sitting;Minimal assistance   Upper Body Bathing: Contact guard assist;Minimal assistance;Sitting   Lower Body Bathing: Maximal assistance;Sitting/lateral leans   Upper Body Dressing : Contact guard assist;Minimal assistance;Sitting   Lower Body Dressing: Maximal assistance;Total assistance;Sitting/lateral leans   Toilet Transfer: Minimal assistance;Rolling walker (2 wheels);Moderate assistance;Stand-pivot Toilet Transfer Details (indicate cue type and reason): Partially simulated via sit to stand from EOB and L lateral steps with RW. Toileting- Clothing Manipulation and Hygiene: Maximal assistance;Sitting/lateral lean       Functional mobility during ADLs: Minimal assistance;Moderate assistance;Rolling walker (2 wheels)       Vision Baseline Vision/History: 0 No visual deficits Ability to See in Adequate Light: 0 Adequate Patient Visual Report: Blurring of vision (Some blurry vision now per pt report.) Vision Assessment?: Vision impaired- to be further tested in functional context Additional Comments: Pt reports some blurry vision at this time.     Perception Perception: Not tested       Praxis Praxis: Not tested       Pertinent Vitals/Pain Pain Assessment Pain Assessment: Faces Faces Pain Scale: Hurts whole lot Pain Location: B LE Pain Descriptors / Indicators: Grimacing, Guarding, Moaning Pain Intervention(s): Limited activity within patient's tolerance, Monitored during session, Repositioned     Extremity/Trunk Assessment Upper Extremity Assessment Upper Extremity Assessment: Generalized weakness (Limited to ~75% of typical range for shoulder flexion; generally weak.)   Lower Extremity Assessment Lower Extremity Assessment: Defer to PT evaluation   Cervical /  Trunk Assessment Cervical / Trunk Assessment: Kyphotic   Communication Communication Communication: No apparent difficulties   Cognition Arousal: Alert Behavior During Therapy: WFL for tasks assessed/performed Cognition: No apparent impairments                               Following commands: Intact       Cueing  General Comments   Cueing Techniques: Verbal cues;Tactile cues;Visual cues                 Home Living Family/patient expects to be discharged to:: Private residence Living Arrangements: Alone Available Help at Discharge: Family;Available PRN/intermittently Type of Home: Mobile home Home Access: Stairs to enter Entrance Stairs-Number of Steps: 4 Entrance Stairs-Rails: Can reach both Home Layout: One level     Bathroom Shower/Tub: Chief Strategy Officer: Standard Bathroom Accessibility: Yes   Home Equipment: None          Prior Functioning/Environment Prior Level of Function : Independent/Modified Independent             Mobility Comments: Reports as community ambulator with No AD ADLs Comments: Independent with all ADLs and iADLs    OT Problem List: Decreased strength;Decreased activity tolerance;Impaired balance (sitting and/or standing);Pain   OT Treatment/Interventions: Self-care/ADL training;Therapeutic exercise;DME and/or AE instruction;Therapeutic activities;Patient/family education;Balance training      OT Goals(Current goals can be found  in the care plan section)   Acute Rehab OT Goals Patient Stated Goal: Improve function OT Goal Formulation: With patient Time For Goal Achievement: 05/15/24 Potential to Achieve Goals: Good   OT Frequency:  Min 2X/week                  AM-PAC OT 6 Clicks Daily Activity     Outcome Measure Help from another person eating meals?: None Help from another person taking care of personal grooming?: A Little Help from another person toileting, which includes  using toliet, bedpan, or urinal?: A Little Help from another person bathing (including washing, rinsing, drying)?: A Lot Help from another person to put on and taking off regular upper body clothing?: A Little Help from another person to put on and taking off regular lower body clothing?: A Lot 6 Click Score: 17   End of Session Equipment Utilized During Treatment: Gait belt;Rolling walker (2 wheels) Nurse Communication: Mobility status  Activity Tolerance: Patient limited by pain Patient left: in bed;with call bell/phone within reach;with bed alarm set;with family/visitor present  OT Visit Diagnosis: Unsteadiness on feet (R26.81);Other abnormalities of gait and mobility (R26.89);Muscle weakness (generalized) (M62.81)                Time: 8890-8875 OT Time Calculation (min): 15 min Charges:  OT General Charges $OT Visit: 1 Visit OT Evaluation $OT Eval Low Complexity: 1 Low  Retal Tonkinson OT, MOT  Jayson Person 05/01/2024, 12:29 PM

## 2024-05-01 NOTE — TOC Progression Note (Addendum)
 Transition of Care [facility]) - Progression Note    Patient Details  Name: Laurie Wallace MRN: [id number] Date of Birth: Jul 20, 1945  [facility] CM/SW Contact  Noreen KATHEE [place], [place] Phone Number: 05/01/2024, 2:31 PM  Clinical Narrative:     CSW spoke with patient and family at bedside regarding bed searches. Patient niece [name] accepted bed offer for [facility]. Spoke with Cierra who confirmed having a bed for this patient. [facility]ara will be started.   Expected Discharge Plan: Skilled Nursing Facility Barriers to Discharge: Continued Medical Work up       Expected Discharge Plan and Services In-house Referral: Clinical Social Work   Post Acute Care Choice: Durable Medical Equipment Living arrangements for the past 2 months: Single Family Home                                       Social Drivers of Health (SDOH) Interventions SDOH Screenings   Food Insecurity: No Food Insecurity (04/28/2024)  Housing: Low Risk  (04/28/2024)  Transportation Needs: No Transportation Needs (04/28/2024)  Utilities: Not At Risk (04/28/2024)  Alcohol Screen: Low Risk  (06/14/2023)  Depression (PHQ2-9): Medium Risk (11/08/2023)  Financial Resource Strain: Low Risk  (06/14/2023)  Physical Activity: Sufficiently Active (06/14/2023)  Social Connections: Moderately Isolated (04/28/2024)  Stress: No Stress Concern Present (06/14/2023)  Tobacco Use: Low Risk  (04/28/2024)  Health Literacy: Adequate Health Literacy (06/14/2023)    Readmission Risk Interventions    01/11/2023    2:07 PM  Readmission Risk Prevention Plan  Transportation Screening Complete  PCP or Specialist Appt within 5-7 Days Complete  Home Care Screening Complete  Medication Review (RN CM) Complete

## 2024-05-01 NOTE — Consult Note (Signed)
 Chief Complaint: Patient was seen in consultation today for leukocytosis.   Referring Physician(s): Dr. Afton Louder   Supervising Physician: Philip Cornet  Patient Status: Laurie Wallace - inpatient   History of Present Illness: Laurie Wallace is a 78 y.o. female with a medical history significant for COPD, rheumatoid arthritis, PE and thrombosis of the thoracic aorta. She underwent endovascular aortic repair 04/12/24. She has been undergoing outpatient work up for leukocytosis with concern for lymphoproliferative malignancy. She missed several appointments recently due to feeling unwell. She presented to the [facility] ED 04/28/24 with generalized weakness, fatigue and left lower extremity pain. ED work up was significant for WBC count of 50.2 and platelets > 800.    Oncology was consulted and the recommendation was made for a bone marrow biopsy for further work up.   Past Medical History:  Diagnosis Date   Allergy    Anemia    Iron  Deficiency   Arthritis    Rheumatoid Arthritis   Family history of adverse reaction to anesthesia    Sister has N/V   GERD (gastroesophageal reflux disease)    Peripheral vascular disease    Pneumonia     Past Surgical History:  Procedure Laterality Date   ABDOMINAL HYSTERECTOMY  2000   THORACIC AORTIC ENDOVASCULAR STENT GRAFT N/A 04/12/2024   Procedure: INSERTION, ENDOVASCULAR STENT GRAFT, AORTA, THORACIC;  Surgeon: Magda Debby SAILOR, MD;  Location: [facility] OR;  Service: Vascular;  Laterality: N/A;   TOE SURGERY Right 2015   1st toe   ULTRASOUND GUIDANCE FOR VASCULAR ACCESS Right 04/12/2024   Procedure: ULTRASOUND GUIDANCE, FOR VASCULAR ACCESS;  Surgeon: Magda Debby SAILOR, MD;  Location: [facility] OR;  Service: Vascular;  Laterality: Right;    Allergies: Patient has no known allergies.  Medications: Prior to Admission medications   Medication Sig Start Date End Date Taking? Authorizing Provider  aspirin EC 81 MG tablet Take 81 mg by mouth  daily. Swallow whole.   Yes [provider]  ELIQUIS  5 MG TABS tablet Take 1 tablet (5 mg total) by mouth 2 (two) times daily. 02/08/24  Yes Rogers Hai, MD  folic acid  (FOLVITE ) 1 MG tablet Take 1 tablet (1 mg total) by mouth daily. 12/23/23  Yes Rice, Lonni ORN, MD  levocetirizine (XYZAL ) 5 MG tablet Take 1 tablet (5 mg total) by mouth every evening. 10/13/23  Yes [name], MD  methotrexate  (RHEUMATREX) 2.5 MG tablet Take 6 tablets (15 mg total) by mouth once a week. Caution:Chemotherapy. Protect from light. Patient taking differently: Take 15 mg by mouth every Tuesday. Caution:Chemotherapy. Protect from light. 03/27/24  Yes Rice, Lonni ORN, MD  famotidine  (PEPCID ) 40 MG tablet TAKE ONE TABLET BY MOUTH DAILY Patient not taking: Reported on 04/28/2024 04/10/24   Patel, Rutwik K, MD  brompheniramine (VAZOL) 2 MG/5ML LIQD Take 2 mg by mouth 2 (two) times daily.  07/23/20  [provider]     Family History  Problem Relation Age of Onset   Cancer Mother    Cancer Father    Heart failure Sister    COPD Sister    Dementia Sister     Social History   Socioeconomic History   Marital status: Single    Spouse name: Not on file   Number of children: Not on file   Years of education: Not on file   Highest education level: Not on file  Occupational History   Occupation: full time  Tobacco Use   Smoking status: Never  Passive exposure: Never   Smokeless tobacco: Never  Vaping Use   Vaping status: Never Used  Substance and Sexual Activity   Alcohol use: No   Drug use: No   Sexual activity: Not Currently  Other Topics Concern   Not on file  Social History Narrative   Lives with her son   Right Handed   Drinks 1-2 cups caffeine daily in winter   Social Drivers of Health   Financial Resource Strain: Low Risk  (06/14/2023)   Overall Financial Resource Strain (CARDIA)    Difficulty of Paying Living Expenses: Not hard at all  Food Insecurity: No  Food Insecurity (04/28/2024)   Hunger Vital Sign    Worried About Running Out of Food in the Last Year: Never true    Ran Out of Food in the Last Year: Never true  Transportation Needs: No Transportation Needs (04/28/2024)   PRAPARE - Administrator, Civil Service (Medical): No    Lack of Transportation (Non-Medical): No  Physical Activity: Sufficiently Active (06/14/2023)   Exercise Vital Sign    Days of Exercise per Week: 5 days    Minutes of Exercise per Session: 30 min  Stress: No Stress Concern Present (06/14/2023)   [employer] of Occupational Health - Occupational Stress Questionnaire    Feeling of Stress : Not at all  Social Connections: Moderately Isolated (04/28/2024)   Social Connection and Isolation Panel    Frequency of Communication with Friends and Family: More than three times a week    Frequency of Social Gatherings with Friends and Family: More than three times a week    Attends Religious Services: More than 4 times per year    Active Member of [employer] or Organizations: No    Attends Banker Meetings: Never    Marital Status: Widowed    Review of Systems: A 12 point ROS discussed and pertinent positives are indicated in the HPI above.  All other systems are negative.  Review of Systems  Constitutional:  Positive for fatigue.  Gastrointestinal:  Positive for nausea.  Musculoskeletal:  Positive for arthralgias and myalgias.  Neurological:  Positive for weakness.  All other systems reviewed and are negative.   Vital Signs: BP (!) 125/55 (BP Location: Left Arm)   Pulse 97   Temp 99.5 F (37.5 C) (Oral)   Resp 18   Ht 5' 6 (1.676 m)   Wt 114 lb 3.2 oz (51.8 kg)   SpO2 97%   BMI 18.43 kg/m   Physical Exam HENT:     Mouth/Throat:     Mouth: Mucous membranes are moist.     Pharynx: Oropharynx is clear.  Cardiovascular:     Rate and Rhythm: Normal rate.  Pulmonary:     Effort: Pulmonary effort is normal.  Abdominal:      Tenderness: There is no abdominal tenderness.  Skin:    General: Skin is warm and dry.  Neurological:     Mental Status: She is alert and oriented to person, place, and time.      Labs:  CBC: Recent Labs    04/27/24 1606 04/28/24 1532 04/29/24 0353 05/01/24 0450  WBC 60.5* 58.5* 50.2* 87.8*  HGB 9.9* 8.3* 8.4* 8.2*  HCT 31.8* 26.3* 26.9* 25.6*  PLT 829* 447* 810* 1,212*    COAGS: Recent Labs    09/29/23 1739 04/11/24 1400 04/12/24 1110 04/28/24 1955  INR 1.1 1.3*  --  2.1*  APTT  --  34 42*  --  BMP: Recent Labs    04/27/24 1606 04/28/24 1532 04/29/24 0353 05/01/24 0450  NA 139 137 145 145  K 4.2 4.1 4.0 3.7  CL 100 101 108 107  CO2 23 20* 25 25  GLUCOSE 93 93 69* 94  BUN 11 11 7* <5*  CALCIUM 9.5 9.1 8.6* 8.9  CREATININE 1.08* 1.12* 0.96 0.95  GFRNONAA 52* 50* >60 >60    LIVER FUNCTION TESTS: Recent Labs    09/29/23 1739 12/23/23 1318 03/27/24 1205 04/11/24 1400 04/27/24 1606 04/28/24 1532  BILITOT 0.3   < > 0.4 0.6 0.5 0.5  AST 24   < > 25 31 28 30   ALT 21   < > 13 25 11 9   ALKPHOS 112  --   --  90 123 112  PROT 7.6   < > 6.7 6.8 7.7 7.4  ALBUMIN 3.7  --   --  4.0 4.5 4.2   < > = values in this interval not displayed.    TUMOR MARKERS: No results for input(s): AFPTM, CEA, CA199, CHROMGRNA in the last 8760 hours.  Assessment and Plan:  Leukocytosis with concern for lymphoproliferative malignancy: Laurie Weaver. Wallace, 78 year old female, is tentatively scheduled 05/02/24 for an image-guided bone marrow biopsy with aspiration. The procedure will be performed at [facility] and the patient will be transported via [facility]. She will return to [facility] post-procedure. The procedure details were discussed at the bedside with the patient and two family members.   Risks and benefits of this procedure were discussed with the patient and/or patient's family including, but not limited to bleeding, infection, damage to adjacent  structures or low yield requiring additional tests.  All of the questions were answered and there is agreement to proceed. She will be NPO at midnight.   Consent signed and in chart.  Thank you for this interesting consult.  I greatly enjoyed meeting Laurie Wallace [facility] and look forward to participating in their care.  A copy of this report was sent to the requesting provider on this date.  Electronically Signed: Warren Dais, AGACNP-BC 05/01/2024, 9:50 AM   I spent a total of 20 Minutes    in face to face in clinical consultation, greater than 50% of which was counseling/coordinating care for leukocytosis.

## 2024-05-02 DIAGNOSIS — Z8701 Personal history of pneumonia (recurrent): Secondary | ICD-10-CM | POA: Diagnosis not present

## 2024-05-02 DIAGNOSIS — I739 Peripheral vascular disease, unspecified: Secondary | ICD-10-CM | POA: Diagnosis present

## 2024-05-02 DIAGNOSIS — R64 Cachexia: Secondary | ICD-10-CM | POA: Diagnosis present

## 2024-05-02 DIAGNOSIS — Z7901 Long term (current) use of anticoagulants: Secondary | ICD-10-CM | POA: Diagnosis not present

## 2024-05-02 DIAGNOSIS — Z825 Family history of asthma and other chronic lower respiratory diseases: Secondary | ICD-10-CM | POA: Diagnosis not present

## 2024-05-02 DIAGNOSIS — R509 Fever, unspecified: Secondary | ICD-10-CM | POA: Diagnosis present

## 2024-05-02 DIAGNOSIS — J449 Chronic obstructive pulmonary disease, unspecified: Secondary | ICD-10-CM | POA: Diagnosis present

## 2024-05-02 DIAGNOSIS — C9310 Chronic myelomonocytic leukemia not having achieved remission: Secondary | ICD-10-CM | POA: Diagnosis present

## 2024-05-02 DIAGNOSIS — R531 Weakness: Secondary | ICD-10-CM | POA: Diagnosis not present

## 2024-05-02 DIAGNOSIS — D471 Chronic myeloproliferative disease: Secondary | ICD-10-CM | POA: Diagnosis not present

## 2024-05-02 DIAGNOSIS — Z95828 Presence of other vascular implants and grafts: Secondary | ICD-10-CM | POA: Diagnosis not present

## 2024-05-02 DIAGNOSIS — Z79631 Long term (current) use of antimetabolite agent: Secondary | ICD-10-CM | POA: Diagnosis not present

## 2024-05-02 DIAGNOSIS — I2694 Multiple subsegmental pulmonary emboli without acute cor pulmonale: Secondary | ICD-10-CM | POA: Diagnosis not present

## 2024-05-02 DIAGNOSIS — K219 Gastro-esophageal reflux disease without esophagitis: Secondary | ICD-10-CM | POA: Diagnosis present

## 2024-05-02 DIAGNOSIS — Z86711 Personal history of pulmonary embolism: Secondary | ICD-10-CM | POA: Diagnosis not present

## 2024-05-02 DIAGNOSIS — D75839 Thrombocytosis, unspecified: Secondary | ICD-10-CM | POA: Diagnosis not present

## 2024-05-02 DIAGNOSIS — Z7982 Long term (current) use of aspirin: Secondary | ICD-10-CM | POA: Diagnosis not present

## 2024-05-02 DIAGNOSIS — Z90710 Acquired absence of both cervix and uterus: Secondary | ICD-10-CM | POA: Diagnosis not present

## 2024-05-02 DIAGNOSIS — Z8249 Family history of ischemic heart disease and other diseases of the circulatory system: Secondary | ICD-10-CM | POA: Diagnosis not present

## 2024-05-02 DIAGNOSIS — D539 Nutritional anemia, unspecified: Secondary | ICD-10-CM | POA: Diagnosis present

## 2024-05-02 DIAGNOSIS — D72829 Elevated white blood cell count, unspecified: Secondary | ICD-10-CM | POA: Diagnosis not present

## 2024-05-02 DIAGNOSIS — Z79899 Other long term (current) drug therapy: Secondary | ICD-10-CM | POA: Diagnosis not present

## 2024-05-02 DIAGNOSIS — R262 Difficulty in walking, not elsewhere classified: Secondary | ICD-10-CM | POA: Diagnosis not present

## 2024-05-02 DIAGNOSIS — Z86718 Personal history of other venous thrombosis and embolism: Secondary | ICD-10-CM | POA: Diagnosis not present

## 2024-05-02 DIAGNOSIS — G893 Neoplasm related pain (acute) (chronic): Secondary | ICD-10-CM | POA: Diagnosis present

## 2024-05-02 DIAGNOSIS — E860 Dehydration: Secondary | ICD-10-CM | POA: Diagnosis present

## 2024-05-02 DIAGNOSIS — M059 Rheumatoid arthritis with rheumatoid factor, unspecified: Secondary | ICD-10-CM | POA: Diagnosis present

## 2024-05-02 DIAGNOSIS — Z681 Body mass index (BMI) 19 or less, adult: Secondary | ICD-10-CM | POA: Diagnosis not present

## 2024-05-02 DIAGNOSIS — Z8679 Personal history of other diseases of the circulatory system: Secondary | ICD-10-CM | POA: Diagnosis not present

## 2024-05-02 LAB — CBC WITH DIFFERENTIAL/PLATELET
Abs Immature Granulocytes: 22.2 K/uL — ABNORMAL HIGH (ref 0.00–0.07)
Band Neutrophils: 7 %
Basophils Absolute: 0 K/uL (ref 0.0–0.1)
Basophils Relative: 0 %
Blasts: 6 %
Eosinophils Absolute: 0 K/uL (ref 0.0–0.5)
Eosinophils Relative: 0 %
HCT: 26.8 % — ABNORMAL LOW (ref 36.0–46.0)
Hemoglobin: 8.4 g/dL — ABNORMAL LOW (ref 12.0–15.0)
Lymphocytes Relative: 6 %
Lymphs Abs: 7.4 K/uL — ABNORMAL HIGH (ref 0.7–4.0)
MCH: 31.6 pg (ref 26.0–34.0)
MCHC: 31.3 g/dL (ref 30.0–36.0)
MCV: 100.8 fL — ABNORMAL HIGH (ref 80.0–100.0)
Metamyelocytes Relative: 7 %
Monocytes Absolute: 14.8 K/uL — ABNORMAL HIGH (ref 0.1–1.0)
Monocytes Relative: 12 %
Myelocytes: 7 %
Neutro Abs: 71.5 K/uL — ABNORMAL HIGH (ref 1.7–7.7)
Neutrophils Relative %: 51 %
Platelets: 1250 K/uL (ref 150–400)
Promyelocytes Relative: 4 %
RBC: 2.66 MIL/uL — ABNORMAL LOW (ref 3.87–5.11)
RDW: 21.1 % — ABNORMAL HIGH (ref 11.5–15.5)
WBC: 123.3 K/uL (ref 4.0–10.5)
nRBC: 0.3 % — ABNORMAL HIGH (ref 0.0–0.2)

## 2024-05-02 LAB — PROCALCITONIN: Procalcitonin: 0.48 ng/mL

## 2024-05-02 LAB — URIC ACID: Uric Acid, Serum: 8.8 mg/dL — ABNORMAL HIGH (ref 2.5–7.1)

## 2024-05-02 LAB — LACTATE DEHYDROGENASE: LDH: 879 U/L — ABNORMAL HIGH (ref 98–192)

## 2024-05-02 MED ORDER — ACETAMINOPHEN 325 MG PO TABS
650.0000 mg | ORAL_TABLET | Freq: Four times a day (QID) | ORAL | Status: DC
  Administered 2024-05-02 – 2024-05-08 (×25): 650 mg via ORAL
  Filled 2024-05-02 (×26): qty 2

## 2024-05-02 MED ORDER — ACETAMINOPHEN 650 MG RE SUPP
650.0000 mg | Freq: Four times a day (QID) | RECTAL | Status: DC
  Filled 2024-05-02: qty 1

## 2024-05-02 NOTE — TOC Progression Note (Signed)
 Transition of Care [facility]) - Progression Note    Patient Details  Name: Laurie Wallace MRN: [id number] Date of Birth: 1945-07-19  Transition of Care [facility]) CM/SW Contact  Sharlyne Stabs, RN Phone Number: 05/02/2024, 11:49 AM Clinical Narrative:   Insurance approved 10/28 - 10/30, navi auth id [id number], plan auth id [id number] For [facility]. Patient is not medically ready. DC planning for 1-2 days.   Expected Discharge Plan: Skilled Nursing Facility Barriers to Discharge: Continued Medical Work up  Expected Discharge Plan and Services In-house Referral: Clinical Social Work   Post Acute Care Choice: Durable Medical Equipment Living arrangements for the past 2 months: Single Family Home                    Social Drivers of Health (SDOH) Interventions SDOH Screenings   Food Insecurity: No Food Insecurity (04/28/2024)  Housing: Low Risk  (04/28/2024)  Transportation Needs: No Transportation Needs (04/28/2024)  Utilities: Not At Risk (04/28/2024)  Alcohol Screen: Low Risk  (06/14/2023)  Depression (PHQ2-9): Medium Risk (11/08/2023)  Financial Resource Strain: Low Risk  (06/14/2023)  Physical Activity: Sufficiently Active (06/14/2023)  Social Connections: Moderately Isolated (04/28/2024)  Stress: No Stress Concern Present (06/14/2023)  Tobacco Use: Low Risk  (04/28/2024)  Health Literacy: Adequate Health Literacy (06/14/2023)    Readmission Risk Interventions    01/11/2023    2:07 PM  Readmission Risk Prevention Plan  Transportation Screening Complete  PCP or Specialist Appt within 5-7 Days Complete  Home Care Screening Complete  Medication Review (RN CM) Complete

## 2024-05-02 NOTE — Plan of Care (Signed)

## 2024-05-02 NOTE — Progress Notes (Signed)
 PROGRESS NOTE   Laurie Wallace  FMW:[id number] DOB: 1945-12-06 DOA: 04/28/2024 PCP: [name], MD   Chief Complaint  Patient presents with   Leg Pain   Level of care: Med-Surg  Brief Admission History:   78 y.o. female, with past medical history of COPD, rheumatoid arthritis, diagnosis of PE earlier in the year, and diagnosis of thrombosis status post endovascular stent placement 04/14/2024, patient undergoing workup for leukocytosis with concern of lymphoproliferative malignancy (CML versus CMML), which she missed most recent 2 appointment earlier this week due to not feeling well, and living by herself with family not aware she has appointment scheduled with oncology - Presents to ED secondary to generalized weakness, fatigue, left lower extremity pain, denies fever, chills, chest pain or shortness of breath, reports she is compliant with her Eliquis .   Assessment and Plan:  Lymphoproliferative malignancy Leukocytosis and thrombocytosis Concern for CML versus CMML WBC up to 123K today and platelets up to 1250, discussed with Dr. Davonna with heme/onc and consulting  Pt says she has missed appts and has not established care with outpatient oncologist - discussed with Dr. Davonna and requesting IR guided bone marrow biopsy which is planned at [facility] 10/29 per IR - discussed with Dr. Davonna and pt started on hydroxyurea 500 mg BID on 05/01/24  Macrocytic anemia -- B12 (1469), folate (>20)  Presumed cancer related pain -- schedule acetaminophen  around the clock   Fever - suspect related to suspected malignancy - follow and treat supportively - full sepsis work up ordered to rule out infection     Generalized weakness and deconditioning in the setting of Progressive lymphoproliferative malignancy Hydrate, repeat labs in the morning and therapy evaluation. Therapy evaluations recommending SNF, patient is agreeable and TOC is on board for placement   History of  pulmonary embolism Continue with anticoagulation - apixaban  5 mg BID  history of recent aortic thrombosis status post insertion of endovascular thoracic aortic stent graft by Dr. Magda on 04/14/2024    COPD No wheezing Bronchodilators PRN    Rheumatoid arthritis Continue with methotrexate  15 mg weekly (Tuesdays)  DVT prophylaxis: apixaban  Code Status: Full  Family Communication: niece at bedside 10/26, 10/28 Disposition: anticipating SNF placement    Consultants:   Procedures:   Antimicrobials:    Subjective: Pt upset that she is NPO for procedure, requesting to eat, c/o bone pain all over.    Objective: Vitals:   05/01/24 1642 05/01/24 1800 05/01/24 2126 05/02/24 0601  BP: (!) 126/50  (!) 112/58 (!) 123/53  Pulse: (!) 119  97 (!) 113  Resp: 20  19 19   Temp: (!) 101.2 F (38.4 C) 98.6 F (37 C) 98.2 F (36.8 C) (!) 100.7 F (38.2 C)  TempSrc: Oral Oral Oral Oral  SpO2: 94%  95% 96%  Weight:      Height:        Intake/Output Summary (Last 24 hours) at 05/02/2024 1008 Last data filed at 05/01/2024 1300 Gross per 24 hour  Intake 240 ml  Output --  Net 240 ml   Filed Weights   04/28/24 1522 04/28/24 2100  Weight: 54.4 kg 51.8 kg   Examination:  General exam: Appears cachectic and chronically ill.  Respiratory system: Clear to auscultation. Respiratory effort normal. Cardiovascular system: normal S1 & S2 heard. No JVD, murmurs, rubs, gallops or clicks. No pedal edema. Gastrointestinal system: Abdomen is nondistended, soft and nontender. No organomegaly or masses felt. Normal bowel sounds heard. Central nervous system: Alert and oriented.  No focal neurological deficits. Extremities: Symmetric 5 x 5 power. Skin: No rashes, lesions or ulcers. Psychiatry: Judgement and insight appear diminished. Mood & affect appropriate.   Data Reviewed: I have personally reviewed following labs and imaging studies  CBC: Recent Labs  Lab 04/27/24 1606 04/28/24 1532  04/29/24 0353 05/01/24 0450 05/02/24 0421  WBC 60.5* 58.5* 50.2* 87.8* 123.3*  NEUTROABS 30.3* 26.8* 27.6* 35.1* 71.5*  HGB 9.9* 8.3* 8.4* 8.2* 8.4*  HCT 31.8* 26.3* 26.9* 25.6* 26.8*  MCV 100.3* 101.5* 103.1* 102.4* 100.8*  PLT 829* 447* 810* 1,212* 1,250*    Basic Metabolic Panel: Recent Labs  Lab 04/27/24 1606 04/28/24 1532 04/28/24 1955 04/29/24 0353 05/01/24 0450  NA 139 137  --  145 145  K 4.2 4.1  --  4.0 3.7  CL 100 101  --  108 107  CO2 23 20*  --  25 25  GLUCOSE 93 93  --  69* 94  BUN 11 11  --  7* <5*  CREATININE 1.08* 1.12*  --  0.96 0.95  CALCIUM 9.5 9.1  --  8.6* 8.9  PHOS  --   --  3.7  --   --     CBG: No results for input(s): GLUCAP in the last 168 hours.  Recent Results (from the past 240 hours)  Resp panel by RT-PCR (RSV, Flu A&B, Covid) Anterior Nasal Swab     Status: None   Collection Time: 04/27/24  6:00 PM   Specimen: Anterior Nasal Swab  Result Value Ref Range Status   SARS Coronavirus 2 by RT PCR NEGATIVE NEGATIVE Final    Comment: (NOTE) SARS-CoV-2 target nucleic acids are NOT DETECTED.  The SARS-CoV-2 RNA is generally detectable in upper respiratory specimens during the acute phase of infection. The lowest concentration of SARS-CoV-2 viral copies this assay can detect is 138 copies/mL. A negative result does not preclude SARS-Cov-2 infection and should not be used as the sole basis for treatment or other patient management decisions. A negative result may occur with  improper specimen collection/handling, submission of specimen other than nasopharyngeal swab, presence of viral mutation(s) within the areas targeted by this assay, and inadequate number of viral copies(<138 copies/mL). A negative result must be combined with clinical observations, patient history, and epidemiological information. The expected result is Negative.  Fact Sheet for Patients:  bloggercourse.com  Fact Sheet for Healthcare  Providers:  seriousbroker.it  This test is no t yet approved or cleared by the [place]  FDA and  has been authorized for detection and/or diagnosis of SARS-CoV-2 by FDA under an Emergency Use Authorization (EUA). This EUA will remain  in effect (meaning this test can be used) for the duration of the COVID-19 declaration under Section 564(b)(1) of the Act, 21 U.S.C.section 360bbb-3(b)(1), unless the authorization is terminated  or revoked sooner.       Influenza A by PCR NEGATIVE NEGATIVE Final   Influenza B by PCR NEGATIVE NEGATIVE Final    Comment: (NOTE) The Xpert Xpress SARS-CoV-2/FLU/RSV plus assay is intended as an aid in the diagnosis of influenza from Nasopharyngeal swab specimens and should not be used as a sole basis for treatment. Nasal washings and aspirates are unacceptable for Xpert Xpress SARS-CoV-2/FLU/RSV testing.  Fact Sheet for Patients: bloggercourse.com  Fact Sheet for Healthcare Providers: seriousbroker.it  This test is not yet approved or cleared by the [place]  FDA and has been authorized for detection and/or diagnosis of SARS-CoV-2 by FDA under an Emergency Use Authorization (EUA). This  EUA will remain in effect (meaning this test can be used) for the duration of the COVID-19 declaration under Section 564(b)(1) of the Act, 21 U.S.C. section 360bbb-3(b)(1), unless the authorization is terminated or revoked.     Resp Syncytial Virus by PCR NEGATIVE NEGATIVE Final    Comment: (NOTE) Fact Sheet for Patients: bloggercourse.com  Fact Sheet for Healthcare Providers: seriousbroker.it  This test is not yet approved or cleared by the [place]  FDA and has been authorized for detection and/or diagnosis of SARS-CoV-2 by FDA under an Emergency Use Authorization (EUA). This EUA will remain in effect (meaning this test can be  used) for the duration of the COVID-19 declaration under Section 564(b)(1) of the Act, 21 U.S.C. section 360bbb-3(b)(1), unless the authorization is terminated or revoked.  Performed at [facility], [address], [place], [place] [address]   Culture, blood (Routine X 2) w Reflex to ID Panel     Status: None (Preliminary result)   Collection Time: 05/01/24  4:00 PM   Specimen: BLOOD LEFT HAND  Result Value Ref Range Status   Specimen Description BLOOD LEFT HAND AEROBIC BOTTLE ONLY  Final   Special Requests   Final    Blood Culture results may not be optimal due to an inadequate volume of blood received in culture bottles   Culture   Final    NO GROWTH < 24 HOURS Performed at [facility], [address] [place], [place] [address]    Report Status PENDING  Incomplete  Culture, blood (Routine X 2) w Reflex to ID Panel     Status: None (Preliminary result)   Collection Time: 05/01/24  4:00 PM   Specimen: BLOOD LEFT FOREARM  Result Value Ref Range Status   Specimen Description BLOOD LEFT FOREARM AEROBIC BOTTLE ONLY  Final   Special Requests   Final    Blood Culture results may not be optimal due to an inadequate volume of blood received in culture bottles   Culture   Final    NO GROWTH < 24 HOURS Performed at [facility], [address] [place], [place] [address]    Report Status PENDING  Incomplete   Radiology Studies: DG CHEST PORT 1 VIEW Result Date: 05/01/2024 EXAM: 1 VIEW(S) XRAY OF THE CHEST 05/01/2024 04:09:00 PM COMPARISON: 04/28/2024 CLINICAL HISTORY: Fever; per chart: medical history of COPD, Rheumatoid arthritis, diagnosis of PE, aortic thrombosis s/p thrombectomy and suspicion for lymphoproliferative disorder. FINDINGS: LUNGS AND PLEURA: 1 cm right lower lung nodule. No pulmonary edema. No pleural effusion. No pneumothorax. HEART AND MEDIASTINUM: Descending aorta stent noted. No acute abnormality of the cardiac and mediastinal silhouettes. BONES AND SOFT TISSUES: No acute  osseous abnormality. IMPRESSION: 1. No acute findings. 2. 1 cm right lower lung nodule is similar to prior, likely corresponding to areas of tree-in-bud nodularity on prior CT. Electronically signed by: Donnice Mania MD 05/01/2024 08:41 PM EDT RP Workstation: [id number]    Scheduled Meds:  acetaminophen   650 mg Oral Q6H   Or   acetaminophen   650 mg Rectal Q6H   apixaban   5 mg Oral BID   aspirin EC  81 mg Oral Daily   famotidine   20 mg Oral Daily   folic acid   1 mg Oral Daily   hydroxyurea  500 mg Oral BID   loratadine   5 mg Oral Daily   methotrexate   15 mg Oral Q Tue   Continuous Infusions:   LOS: 0 days   Time spent: 55 mins  Karandeep Resende Vicci, MD  How to contact the [facility] Attending or Consulting provider 7A - 7P or covering provider during after hours 7P -7A, for this patient?  Check the care team in [facility] and look for a) attending/consulting [facility] provider listed and b) the [facility] team listed Log into [email or web address] to find provider on call.  Locate the [facility] provider you are looking for under [facility] and page to a number that you can be directly reached. If you still have difficulty reaching the provider, please page the [facility] (Director on Call) for the Hospitalists listed on amion for assistance.  05/02/2024, 10:08 AM

## 2024-05-02 NOTE — Progress Notes (Signed)
 Family reached out for timing of procedure today. I reached out to [facility] to determine timing of her pickup but they did not have her on their schedule to pickup this morning. Mark at [employer] was notified and he reports pt will need to be rescheduled for first thing tomorrow morning. He is going to reschedule the procedure himself and I contacted [facility] to set up transport from [facility] to [facility] IR on the first truck in the morning. [facility] reports pickup time will be 0720 on 05/03/24. Charlies Adams, pt's assigned RN notified.

## 2024-05-02 NOTE — Progress Notes (Signed)
 Physical Therapy Treatment Patient Details Name: Laurie Wallace MRN: [id number] DOB: 1946-02-14 Today's Date: 05/02/2024   History of Present Illness Laurie Wallace  is a 78 y.o. female, with past medical history of COPD, rheumatoid arthritis, diagnosis of PE earlier in the year, and diagnosis of thrombosis status post endovascular stent placement   04/14/2024, patient undergoing workup for leukocytosis with concern of lymphoproliferative malignancy (CML versus CMML), which she missed most recent 2 appointment earlier this week due to not feeling well, and living by herself with family not aware she has appointment scheduled with oncology  - Presents to ED secondary to generalized weakness, fatigue, left lower extremity pain, denies fever, chills, chest pain or shortness of breath, reports she is compliant with her Eliquis .  - ED her workup significant for significant leukocytosis at 58K, anemia with hemoglobin of 8.3, platelet count at 447K, with absolute granulocyte at 17K, INR at 2.1, venous Dopplers negative for left lower extremity edema, x-ray significant for left knee soft tissue edema with possible small knee effusion, ED discussed with oncology on-call who recommended admission for hydration, PT, OT and supportive care.    PT Comments  Patient agreeable to PT treatment session. Pt was received supine in bed, with multiple family members present. This date, pt required min assist for bed mobility due to inc pain in BLE, but more in RLE. Pt appears to be in more pain than when first evaluated. Once EOB, pt demonstrates good seated balance during seated LE exercises. However during LE exercises, pt tends to place hands under R thigh for comfort. Pt still getting good muscle activation, confirmed with palpation to targeted musculature. Min assist with standing and CGA during short ambulation with RW. Pt requesting to limit distance due to pain. Returned to bed with call button in reach  and family present. Patient will benefit from continued skilled physical therapy acutely and in recommended venue in order to address current deficits and improve overall function.     If plan is discharge home, recommend the following: A little help with walking and/or transfers;Assist for transportation;Assistance with cooking/housework;Help with stairs or ramp for entrance   Can travel by private vehicle        Equipment Recommendations  None recommended by PT    Recommendations for Other Services       Precautions / Restrictions Precautions Precautions: Fall Recall of Precautions/Restrictions: Intact Restrictions Weight Bearing Restrictions Per Provider Order: No     Mobility  Bed Mobility Overal bed mobility: Needs Assistance Bed Mobility: Supine to Sit, Sit to Supine     Supine to sit: Min assist Sit to supine: Min assist   General bed mobility comments: Slow labored movement with bed mobility this date due to inc pain in BLE, assisted with LE handling    Transfers Overall transfer level: Needs assistance Equipment used: Rolling walker (2 wheels) Transfers: Sit to/from Stand Sit to Stand: Min assist           General transfer comment: min assist with RW this date due to inc pain fro STS from bed, pt dmeo slow painful labored movement throughout    Ambulation/Gait Ambulation/Gait assistance: Contact guard assist Gait Distance (Feet): 10 Feet Assistive device: Rolling walker (2 wheels) Gait Pattern/deviations: Step-to pattern, Decreased stance time - left, Decreased stride length, Decreased step length - right, Trunk flexed Gait velocity: Dec     General Gait Details: CGA while ambulating in room with RW, inc pain especially with WB in RLE, verbal cueing  given for step length to improve step to pattern and hip/trunk extension as pt tends to lean forward. Distance limited due to pain.   Stairs             Wheelchair Mobility     Tilt Bed     Modified Rankin (Stroke Patients Only)       Balance Overall balance assessment: Needs assistance Sitting-balance support: No upper extremity supported, Feet supported Sitting balance-Leahy Scale: Good Sitting balance - Comments: seated at EOB   Standing balance support: Bilateral upper extremity supported, During functional activity, Reliant on assistive device for balance Standing balance-Leahy Scale: Fair Standing balance comment: poor to fair with RW            Communication Communication Communication: No apparent difficulties  Cognition Arousal: Alert Behavior During Therapy: WFL for tasks assessed/performed   PT - Cognitive impairments: No apparent impairments   Following commands: Intact      Cueing Cueing Techniques: Verbal cues, Tactile cues, Visual cues  Exercises General Exercises - Lower Extremity Long Arc Quad: AROM, Strengthening, Both, 10 reps, Seated Hip ABduction/ADduction: AROM, Strengthening, Both, 5 reps, Seated Hip Flexion/Marching: AROM, Strengthening, Both, 5 reps, Seated Toe Raises: AROM, Strengthening, Both, 10 reps, Seated Heel Raises: AROM, Strengthening, Both, 10 reps, Seated    General Comments        Pertinent Vitals/Pain Pain Assessment Pain Assessment: 0-10 Pain Score: 7  Pain Location: B LE Pain Descriptors / Indicators: Grimacing, Guarding, Moaning Pain Intervention(s): Limited activity within patient's tolerance, Repositioned, Monitored during session    Home Living                          Prior Function            PT Goals (current goals can now be found in the care plan section) Acute Rehab PT Goals Patient Stated Goal: Return home following short rehab stay PT Goal Formulation: With patient Time For Goal Achievement: 05/05/24 Potential to Achieve Goals: Good Progress towards PT goals: Progressing toward goals    Frequency    Min 3X/week      PT Plan      Co-evaluation               AM-PAC PT 6 Clicks Mobility   Outcome Measure  Help needed turning from your back to your side while in a flat bed without using bedrails?: A Little Help needed moving from lying on your back to sitting on the side of a flat bed without using bedrails?: A Little Help needed moving to and from a bed to a chair (including a wheelchair)?: A Little Help needed standing up from a chair using your arms (e.g., wheelchair or bedside chair)?: A Little Help needed to walk in hospital room?: A Little Help needed climbing 3-5 steps with a railing? : A Lot 6 Click Score: 17    End of Session Equipment Utilized During Treatment: Gait belt Activity Tolerance: Patient tolerated treatment well;Patient limited by pain Patient left: in bed;with call bell/phone within reach;with bed alarm set;with family/visitor present   PT Visit Diagnosis: Unsteadiness on feet (R26.81);Muscle weakness (generalized) (M62.81);Pain;Difficulty in walking, not elsewhere classified (R26.2) Pain - Right/Left:  (Both) Pain - part of body: Knee;Leg     Time: 8955-8894 PT Time Calculation (min) (ACUTE ONLY): 21 min  Charges:    $Therapeutic Exercise: 8-22 mins PT General Charges $$ ACUTE PT VISIT: 1 Visit  2:18 PM, 05/02/24 Rosaria Settler, PT, DPT [place] with [facility]

## 2024-05-03 ENCOUNTER — Ambulatory Visit (HOSPITAL_COMMUNITY)
Admit: 2024-05-03 | Discharge: 2024-05-03 | Disposition: A | Discharge status: Home / Self Care | Attending: Family Medicine | Admitting: Family Medicine

## 2024-05-03 DIAGNOSIS — R262 Difficulty in walking, not elsewhere classified: Secondary | ICD-10-CM

## 2024-05-03 DIAGNOSIS — D72829 Elevated white blood cell count, unspecified: Secondary | ICD-10-CM | POA: Diagnosis not present

## 2024-05-03 DIAGNOSIS — D471 Chronic myeloproliferative disease: Secondary | ICD-10-CM | POA: Diagnosis not present

## 2024-05-03 DIAGNOSIS — R531 Weakness: Secondary | ICD-10-CM | POA: Diagnosis not present

## 2024-05-03 HISTORY — PX: IR BONE MARROW BIOPSY & ASPIRATION: IMG5727

## 2024-05-03 LAB — CBC WITH DIFFERENTIAL/PLATELET
Abs Immature Granulocytes: 27.4 K/uL — ABNORMAL HIGH (ref 0.00–0.07)
Basophils Absolute: 0 K/uL (ref 0.0–0.1)
Basophils Relative: 0 %
Blasts: 2 %
Eosinophils Absolute: 2.4 K/uL — ABNORMAL HIGH (ref 0.0–0.5)
Eosinophils Relative: 2 %
HCT: 23.5 % — ABNORMAL LOW (ref 36.0–46.0)
Hemoglobin: 7.6 g/dL — ABNORMAL LOW (ref 12.0–15.0)
Lymphocytes Relative: 12 %
Lymphs Abs: 14.3 K/uL — ABNORMAL HIGH (ref 0.7–4.0)
MCH: 32.5 pg (ref 26.0–34.0)
MCHC: 32.3 g/dL (ref 30.0–36.0)
MCV: 100.4 fL — ABNORMAL HIGH (ref 80.0–100.0)
Metamyelocytes Relative: 8 %
Monocytes Absolute: 15.5 K/uL — ABNORMAL HIGH (ref 0.1–1.0)
Monocytes Relative: 13 %
Myelocytes: 8 %
Neutro Abs: 57.2 K/uL — ABNORMAL HIGH (ref 1.7–7.7)
Neutrophils Relative %: 48 %
Platelets: 1386 K/uL (ref 150–400)
Promyelocytes Relative: 7 %
RBC: 2.34 MIL/uL — ABNORMAL LOW (ref 3.87–5.11)
RDW: 20 % — ABNORMAL HIGH (ref 11.5–15.5)
WBC: 119.1 K/uL (ref 4.0–10.5)
nRBC: 0.3 % — ABNORMAL HIGH (ref 0.0–0.2)

## 2024-05-03 LAB — BASIC METABOLIC PANEL WITH GFR
Anion gap: 12 (ref 5–15)
BUN: 11 mg/dL (ref 8–23)
CO2: 25 mmol/L (ref 22–32)
Calcium: 8.7 mg/dL — ABNORMAL LOW (ref 8.9–10.3)
Chloride: 100 mmol/L (ref 98–111)
Creatinine, Ser: 1.22 mg/dL — ABNORMAL HIGH (ref 0.44–1.00)
GFR, Estimated: 45 mL/min — ABNORMAL LOW (ref >60)
Glucose, Bld: 78 mg/dL (ref 70–99)
Potassium: 3.9 mmol/L (ref 3.5–5.1)
Sodium: 137 mmol/L (ref 135–145)

## 2024-05-03 LAB — HEPATIC FUNCTION PANEL
ALT: 20 U/L (ref 0–44)
AST: 53 U/L — ABNORMAL HIGH (ref 15–41)
Albumin: 3.4 g/dL — ABNORMAL LOW (ref 3.5–5.0)
Alkaline Phosphatase: 183 U/L — ABNORMAL HIGH (ref 38–126)
Bilirubin, Direct: 0.5 mg/dL — ABNORMAL HIGH (ref 0.0–0.2)
Indirect Bilirubin: 0.3 mg/dL (ref 0.3–0.9)
Total Bilirubin: 0.7 mg/dL (ref 0.0–1.2)
Total Protein: 6.1 g/dL — ABNORMAL LOW (ref 6.5–8.1)

## 2024-05-03 LAB — MAGNESIUM: Magnesium: 2.3 mg/dL (ref 1.7–2.4)

## 2024-05-03 MED ORDER — FENTANYL CITRATE (PF) 100 MCG/2ML IJ SOLN
INTRAMUSCULAR | Status: AC | PRN
  Administered 2024-05-03: 50 ug via INTRAVENOUS

## 2024-05-03 MED ORDER — MIDAZOLAM HCL 2 MG/2ML IJ SOLN
INTRAMUSCULAR | Status: AC
  Filled 2024-05-03: qty 2

## 2024-05-03 MED ORDER — MIDAZOLAM HCL (PF) 2 MG/2ML IJ SOLN
INTRAMUSCULAR | Status: AC | PRN
  Administered 2024-05-03: 1 mg via INTRAVENOUS
  Administered 2024-05-03: .5 mg via INTRAVENOUS

## 2024-05-03 MED ORDER — FENTANYL CITRATE (PF) 100 MCG/2ML IJ SOLN
INTRAMUSCULAR | Status: AC
  Filled 2024-05-03: qty 2

## 2024-05-03 MED ORDER — LIDOCAINE-EPINEPHRINE 1 %-1:100000 IJ SOLN
INTRAMUSCULAR | Status: AC
  Filled 2024-05-03: qty 1

## 2024-05-03 NOTE — Plan of Care (Signed)
  Problem: Clinical Measurements: Goal: Respiratory complications will improve Outcome: Progressing   Problem: Activity: Goal: Risk for activity intolerance will decrease Outcome: Progressing   Problem: Coping: Goal: Level of anxiety will decrease Outcome: Progressing   Problem: Elimination: Goal: Will not experience complications related to urinary retention Outcome: Progressing   Problem: Pain Managment: Goal: General experience of comfort will improve and/or be controlled Outcome: Progressing

## 2024-05-03 NOTE — Sedation Documentation (Signed)
 Called [facility] for transport back to [facility]

## 2024-05-03 NOTE — Progress Notes (Signed)
   05/03/24 1201  Assess: MEWS Score  Temp 98.5 F (36.9 C)  BP (!) 105/45  MAP (mmHg) (!) 64  Pulse Rate (!) 119  Resp 18  SpO2 96 %  O2 Device Room Air  Assess: MEWS Score  MEWS Temp 0  MEWS Systolic 0  MEWS Pulse 2  MEWS RR 0  MEWS LOC 0  MEWS Score 2  MEWS Score Color Yellow  Assess: if the MEWS score is Yellow or Red  Were vital signs accurate and taken at a resting state? Yes  Does the patient meet 2 or more of the SIRS criteria? No  Does the patient have a confirmed or suspected source of infection? No  MEWS guidelines implemented  Yes, yellow  Treat  MEWS Interventions Considered administering scheduled or prn medications/treatments as ordered  Take Vital Signs  Increase Vital Sign Frequency  Yellow: Q2hr x1, continue Q4hrs until patient remains green for 12hrs  Escalate  MEWS: Escalate Yellow: Discuss with charge nurse and consider notifying provider and/or RRT  Notify: Charge Nurse/RN  Name of Charge Nurse/RN Notified Ronal Dragon [facility] RN  Provider Notification  Provider Name/Title Dr. Evonnie  Date Provider Notified 05/03/24  Time Provider Notified 1343  Method of Notification  (secure chat)  Notification Reason Change in status  Assess: SIRS CRITERIA  SIRS Temperature  0  SIRS Respirations  0  SIRS Pulse 1  SIRS WBC 1  SIRS Score Sum  2

## 2024-05-03 NOTE — Plan of Care (Signed)

## 2024-05-03 NOTE — Progress Notes (Signed)
 PT Cancellation Note  Patient Details Name: Laurie Wallace MRN: [id number] DOB: [birth year]   Cancelled Treatment:    Reason Eval/Treat Not Completed: Patient at procedure or test/unavailable Pt not in room when treatment attempted. Pt transferred to [facility] for procedure. Will attempt at later time/date as available and appropriate.   10:51 AM, 05/03/24 Rosaria Settler, PT, DPT [place] with [facility]

## 2024-05-03 NOTE — Progress Notes (Signed)
 Physical Therapy Treatment Patient Details Name: Laurie Wallace MRN: [id number] DOB: 23-Dec-1945 Today's Date: 05/03/2024   History of Present Illness Laurie Wallace  is a 78 y.o. female, with past medical history of COPD, rheumatoid arthritis, diagnosis of PE earlier in the year, and diagnosis of thrombosis status post endovascular stent placement   04/14/2024, patient undergoing workup for leukocytosis with concern of lymphoproliferative malignancy (CML versus CMML), which she missed most recent 2 appointment earlier this week due to not feeling well, and living by herself with family not aware she has appointment scheduled with oncology  - Presents to ED secondary to generalized weakness, fatigue, left lower extremity pain, denies fever, chills, chest pain or shortness of breath, reports she is compliant with her Eliquis .  - ED her workup significant for significant leukocytosis at 58K, anemia with hemoglobin of 8.3, platelet count at 447K, with absolute granulocyte at 17K, INR at 2.1, venous Dopplers negative for left lower extremity edema, x-ray significant for left knee soft tissue edema with possible small knee effusion, ED discussed with oncology on-call who recommended admission for hydration, PT, OT and supportive care.    PT Comments  Patient agreeable to PT treatment session. Pt appears and reports to be in less pain as compared to yesterday. Reports inc pain, 4/10, during ambulation. On this date, pt is supervision for supine>sit but requires min assist during sit>supine. Min/CGA during STS and ambulation with RW. Pt ambulates into restroom (independent with pericare in sitting) and hallway this date. Pt demonstrates ability to perform marches in standing position with support of RW, in preparation of stair training, CGA given for safety. Pt with appropriate fatigue at end of session. Returns to bed, call button within reach, sister present, and all needs met. Patient will benefit  from continued skilled physical therapy acutely and in recommended venue in order to address current deficits to improve overall function.      If plan is discharge home, recommend the following: A little help with walking and/or transfers;Assist for transportation;Assistance with cooking/housework;Help with stairs or ramp for entrance   Can travel by private vehicle        Equipment Recommendations  None recommended by PT    Recommendations for Other Services       Precautions / Restrictions Precautions Precautions: Fall Recall of Precautions/Restrictions: Intact Restrictions Weight Bearing Restrictions Per Provider Order: No     Mobility  Bed Mobility Overal bed mobility: Needs Assistance Bed Mobility: Supine to Sit, Sit to Supine     Supine to sit: Contact guard, Supervision Sit to supine: Min assist   General bed mobility comments: HOB elevated, CGA/supervision during supine>sit with pt requiring inc time, pt demo slow labored movement but no physical assist this date, min assist with sit to supine requiring assist for LE management    Transfers Overall transfer level: Needs assistance Equipment used: Rolling walker (2 wheels) Transfers: Sit to/from Stand Sit to Stand: Min assist, Contact guard assist           General transfer comment: min assist with RW w/  pt demo slow labored movement throughout,    Ambulation/Gait Ambulation/Gait assistance: Contact guard assist Gait Distance (Feet): 40 Feet Assistive device: Rolling walker (2 wheels) Gait Pattern/deviations: Decreased step length - right, Trunk flexed, Decreased step length - left, Step-through pattern Gait velocity: Dec     General Gait Details: CGA with ambulation into hallway with RW, verbal cues throughout to address forward trunk flexion and correct pt from looking down  at feet while ambulating, improvements noted with step through pattern but cont with short steps overall and mild unsteadiness,  pt with inc fatigue at end of session.   Stairs             Wheelchair Mobility     Tilt Bed    Modified Rankin (Stroke Patients Only)       Balance Overall balance assessment: Needs assistance Sitting-balance support: Feet supported, Bilateral upper extremity supported Sitting balance-Leahy Scale: Good Sitting balance - Comments: seated at EOB   Standing balance support: Bilateral upper extremity supported, During functional activity, Reliant on assistive device for balance Standing balance-Leahy Scale: Fair Standing balance comment: fair with RW      Communication Communication Communication: No apparent difficulties  Cognition Arousal: Alert Behavior During Therapy: WFL for tasks assessed/performed   PT - Cognitive impairments: No apparent impairments     Following commands: Intact      Cueing Cueing Techniques: Verbal cues, Tactile cues, Visual cues  Exercises General Exercises - Lower Extremity Hip Flexion/Marching: AROM, Strengthening, Both, 10 reps, Standing    General Comments        Pertinent Vitals/Pain Pain Assessment Pain Assessment: 0-10 Pain Score: 4  Pain Location: B LE Pain Descriptors / Indicators: Discomfort, Nagging Pain Intervention(s): Limited activity within patient's tolerance, Repositioned, Monitored during session    Home Living                          Prior Function            PT Goals (current goals can now be found in the care plan section) Acute Rehab PT Goals Patient Stated Goal: Return home following short rehab stay PT Goal Formulation: With patient Time For Goal Achievement: 05/05/24 Potential to Achieve Goals: Good Progress towards PT goals: Progressing toward goals    Frequency    Min 3X/week      PT Plan      Co-evaluation              AM-PAC PT 6 Clicks Mobility   Outcome Measure  Help needed turning from your back to your side while in a flat bed without using bedrails?:  A Little Help needed moving from lying on your back to sitting on the side of a flat bed without using bedrails?: A Little Help needed moving to and from a bed to a chair (including a wheelchair)?: A Little Help needed standing up from a chair using your arms (e.g., wheelchair or bedside chair)?: A Little Help needed to walk in hospital room?: A Little Help needed climbing 3-5 steps with a railing? : A Lot 6 Click Score: 17    End of Session Equipment Utilized During Treatment: Gait belt Activity Tolerance: Patient tolerated treatment well;Patient limited by pain Patient left: in bed;with call bell/phone within reach;with family/visitor present Nurse Communication: Mobility status PT Visit Diagnosis: Unsteadiness on feet (R26.81);Muscle weakness (generalized) (M62.81);Pain;Difficulty in walking, not elsewhere classified (R26.2) Pain - part of body: Knee;Leg     Time: 8562-8544 PT Time Calculation (min) (ACUTE ONLY): 18 min  Charges:    $Therapeutic Activity: 8-22 mins PT General Charges $$ ACUTE PT VISIT: 1 Visit                     3:51 PM, 05/03/24 Chandrika Sandles Powell-Butler, PT, DPT [place] with [facility]

## 2024-05-03 NOTE — Procedures (Signed)
 Interventional Radiology Procedure Note  Procedure: FLUORO RT ILIAC BM ASP AND CORE    Complications: None  Estimated Blood Loss:  MIN  Findings: 11G ASP AND CORE    [name], MD

## 2024-05-03 NOTE — Sedation Documentation (Signed)
 Brought 2 family members from waiting room to sit with patient in bay 6.

## 2024-05-03 NOTE — Progress Notes (Signed)
 PROGRESS NOTE  Modell Fendrick FMW:[id number] DOB: May 21, 1946 DOA: 04/28/2024 PCP: [name], MD  Brief History:   78 y.o. female, with past medical history of COPD, rheumatoid arthritis, diagnosis of PE earlier in the year, and diagnosis of thrombosis status post endovascular stent placement 04/12/2024, patient undergoing workup for leukocytosis with concern of lymphoproliferative malignancy (CML versus CMML), which she missed most recent 2 appointment earlier this week due to not feeling well, and living by herself with family not aware she has appointment scheduled with oncology - Presents to ED secondary to generalized weakness, fatigue, left lower extremity pain, denies fever, chills, chest pain or shortness of breath, reports she is compliant with her Eliquis .   Assessment/Plan:  Leukocytosis and thrombocytosis -Concern for CML versus CMML -WBC up to 123K  and platelets up to 1386, discussed with Dr. Davonna with heme/onc and consulting  -Pt says she has missed appts and has not established care with outpatient oncologist - 05/03/24--IR bone marrow bx - discussed with Dr. Davonna and pt started on hydroxyurea 500 mg BID on 05/01/24 -05/03/24 discussed with Dr. Delma d/c until we get some prelim results from bone marrow bx although does not look like acute leukemic conversion right now based on peripheral blood work - 05/03/24 CBC--2% blasts   Macrocytic anemia -- B12 (1469), folate (>20)   Presumed cancer related pain -- schedule acetaminophen  around the clock    Fever - suspect neoplastic fever - follow and treat supportively - blood cultures neg to date - PCT 0.23>>0.48 - UA 21-50 WBC - follow urine culture    Generalized weakness and deconditioning -check TSH -PT eval>>SNF -B12 1469 -folate >20 -CK    History of pulmonary embolism -09/29/23 CTA chest--acute bilateral lower lobe segmental and subsegmental pulmonary emboli -Continue  apixaban  5 mg BID  -history of recent aortic thrombosis status post TEVAR by Dr. Magda on 04/12/2024    COPD No wheezing Bronchodilators PRN    Rheumatoid arthritis Continue with methotrexate  15 mg weekly (Tuesdays)        Family Communication:   Family at bedside 10/29  Consultants:  med onc   Code Status:  FULL   DVT Prophylaxis:  apixaban    Procedures: As Listed in Progress Note Above  Antibiotics: None       Subjective: Pt states leg pains are improving.  She has some RUE pain.  Denies cp, sob, cough, n/v/d. Abd pain  Objective: Vitals:   05/02/24 2158 05/03/24 0512 05/03/24 1201 05/03/24 1410  BP: (!) 117/50 (!) 112/53 (!) 105/45 (!) 102/53  Pulse: 95 93 (!) 119 (!) 102  Resp: (!) 21 19 18 18   Temp: 98 F (36.7 C) (!) 97.5 F (36.4 C) 98.5 F (36.9 C) 99.3 F (37.4 C)  TempSrc: Oral Oral Oral Axillary  SpO2: 97% 97% 96% 97%  Weight:      Height:        Intake/Output Summary (Last 24 hours) at 05/03/2024 1641 Last data filed at 05/03/2024 1300 Gross per 24 hour  Intake 480 ml  Output --  Net 480 ml   Weight change:  Exam:  General:  Pt is alert, follows commands appropriately, not in acute distress HEENT: No icterus, No thrush, No neck mass, [place]/AT Cardiovascular: RRR, S1/S2, no rubs, no gallops Respiratory: CTA bilaterally, no wheezing, no crackles, no rhonchi Abdomen: Soft/+BS, non tender, non distended, no guarding Extremities: No edema, No lymphangitis, No petechiae, No rashes, no  synovitis   Data Reviewed: I have personally reviewed following labs and imaging studies Basic Metabolic Panel: Recent Labs  Lab 04/27/24 1606 04/28/24 1532 04/28/24 1955 04/29/24 0353 05/01/24 0450 05/03/24 0424  NA 139 137  --  145 145 137  K 4.2 4.1  --  4.0 3.7 3.9  CL 100 101  --  108 107 100  CO2 23 20*  --  25 25 25   GLUCOSE 93 93  --  69* 94 78  BUN 11 11  --  7* <5* 11  CREATININE 1.08* 1.12*  --  0.96 0.95 1.22*  CALCIUM 9.5 9.1  --   8.6* 8.9 8.7*  MG  --   --   --   --   --  2.3  PHOS  --   --  3.7  --   --   --    Liver Function Tests: Recent Labs  Lab 04/27/24 1606 04/28/24 1532 05/03/24 0424  AST 28 30 53*  ALT 11 9 20   ALKPHOS 123 112 183*  BILITOT 0.5 0.5 0.7  PROT 7.7 7.4 6.1*  ALBUMIN 4.5 4.2 3.4*   No results for input(s): LIPASE, AMYLASE in the last 168 hours. No results for input(s): AMMONIA in the last 168 hours. Coagulation Profile: Recent Labs  Lab 04/28/24 1955  INR 2.1*   CBC: Recent Labs  Lab 04/28/24 1532 04/29/24 0353 05/01/24 0450 05/02/24 0421 05/03/24 0424  WBC 58.5* 50.2* 87.8* 123.3* 119.1*  NEUTROABS 26.8* 27.6* 35.1* 71.5* 57.2*  HGB 8.3* 8.4* 8.2* 8.4* 7.6*  HCT 26.3* 26.9* 25.6* 26.8* 23.5*  MCV 101.5* 103.1* 102.4* 100.8* 100.4*  PLT 447* 810* 1,212* 1,250* 1,386*   Cardiac Enzymes: Recent Labs  Lab 04/27/24 1606  CKTOTAL 46   BNP: Invalid input(s): POCBNP CBG: No results for input(s): GLUCAP in the last 168 hours. HbA1C: No results for input(s): HGBA1C in the last 72 hours. Urine analysis:    Component Value Date/Time   COLORURINE STRAW (A) 04/28/2024 2320   APPEARANCEUR HAZY (A) 04/28/2024 2320   LABSPEC 1.002 (L) 04/28/2024 2320   PHURINE 6.0 04/28/2024 2320   GLUCOSEU NEGATIVE 04/28/2024 2320   HGBUR NEGATIVE 04/28/2024 2320   BILIRUBINUR NEGATIVE 04/28/2024 2320   KETONESUR NEGATIVE 04/28/2024 2320   PROTEINUR NEGATIVE 04/28/2024 2320   NITRITE NEGATIVE 04/28/2024 2320   LEUKOCYTESUR LARGE (A) 04/28/2024 2320   Sepsis Labs: @LABRCNTIP (procalcitonin:4,lacticidven:4) ) Recent Results (from the past 240 hours)  Resp panel by RT-PCR (RSV, Flu A&B, Covid) Anterior Nasal Swab     Status: None   Collection Time: 04/27/24  6:00 PM   Specimen: Anterior Nasal Swab  Result Value Ref Range Status   SARS Coronavirus 2 by RT PCR NEGATIVE NEGATIVE Final    Comment: (NOTE) SARS-CoV-2 target nucleic acids are NOT DETECTED.  The SARS-CoV-2  RNA is generally detectable in upper respiratory specimens during the acute phase of infection. The lowest concentration of SARS-CoV-2 viral copies this assay can detect is 138 copies/mL. A negative result does not preclude SARS-Cov-2 infection and should not be used as the sole basis for treatment or other patient management decisions. A negative result may occur with  improper specimen collection/handling, submission of specimen other than nasopharyngeal swab, presence of viral mutation(s) within the areas targeted by this assay, and inadequate number of viral copies(<138 copies/mL). A negative result must be combined with clinical observations, patient history, and epidemiological information. The expected result is Negative.  Fact Sheet for Patients:  bloggercourse.com  Fact  Sheet for Healthcare Providers:  seriousbroker.it  This test is no t yet approved or cleared by the [place]  FDA and  has been authorized for detection and/or diagnosis of SARS-CoV-2 by FDA under an Emergency Use Authorization (EUA). This EUA will remain  in effect (meaning this test can be used) for the duration of the COVID-19 declaration under Section 564(b)(1) of the Act, 21 U.S.C.section 360bbb-3(b)(1), unless the authorization is terminated  or revoked sooner.       Influenza A by PCR NEGATIVE NEGATIVE Final   Influenza B by PCR NEGATIVE NEGATIVE Final    Comment: (NOTE) The Xpert Xpress SARS-CoV-2/FLU/RSV plus assay is intended as an aid in the diagnosis of influenza from Nasopharyngeal swab specimens and should not be used as a sole basis for treatment. Nasal washings and aspirates are unacceptable for Xpert Xpress SARS-CoV-2/FLU/RSV testing.  Fact Sheet for Patients: bloggercourse.com  Fact Sheet for Healthcare Providers: seriousbroker.it  This test is not yet approved or cleared by the  [place]  FDA and has been authorized for detection and/or diagnosis of SARS-CoV-2 by FDA under an Emergency Use Authorization (EUA). This EUA will remain in effect (meaning this test can be used) for the duration of the COVID-19 declaration under Section 564(b)(1) of the Act, 21 U.S.C. section 360bbb-3(b)(1), unless the authorization is terminated or revoked.     Resp Syncytial Virus by PCR NEGATIVE NEGATIVE Final    Comment: (NOTE) Fact Sheet for Patients: bloggercourse.com  Fact Sheet for Healthcare Providers: seriousbroker.it  This test is not yet approved or cleared by the [place]  FDA and has been authorized for detection and/or diagnosis of SARS-CoV-2 by FDA under an Emergency Use Authorization (EUA). This EUA will remain in effect (meaning this test can be used) for the duration of the COVID-19 declaration under Section 564(b)(1) of the Act, 21 U.S.C. section 360bbb-3(b)(1), unless the authorization is terminated or revoked.  Performed at [facility], [address], [place], [place] [address]   Culture, blood (Routine X 2) w Reflex to ID Panel     Status: None (Preliminary result)   Collection Time: 05/01/24  4:00 PM   Specimen: BLOOD LEFT HAND  Result Value Ref Range Status   Specimen Description BLOOD LEFT HAND AEROBIC BOTTLE ONLY  Final   Special Requests   Final    Blood Culture results may not be optimal due to an inadequate volume of blood received in culture bottles   Culture   Final    NO GROWTH 2 DAYS Performed at [facility], [address], [place], [place] [address]    Report Status PENDING  Incomplete  Culture, blood (Routine X 2) w Reflex to ID Panel     Status: None (Preliminary result)   Collection Time: 05/01/24  4:00 PM   Specimen: BLOOD LEFT FOREARM  Result Value Ref Range Status   Specimen Description BLOOD LEFT FOREARM AEROBIC BOTTLE ONLY  Final   Special Requests   Final    Blood  Culture results may not be optimal due to an inadequate volume of blood received in culture bottles   Culture   Final    NO GROWTH 2 DAYS Performed at [facility], [address] [place], [place] [address]    Report Status PENDING  Incomplete     Scheduled Meds:  acetaminophen   650 mg Oral Q6H   Or   acetaminophen   650 mg Rectal Q6H   apixaban   5 mg Oral BID   aspirin EC  81  mg Oral Daily   famotidine   20 mg Oral Daily   folic acid   1 mg Oral Daily   hydroxyurea  500 mg Oral BID   loratadine   5 mg Oral Daily   methotrexate   15 mg Oral Q Tue   Continuous Infusions:  Procedures/Studies: IR BONE MARROW BIOPSY & ASPIRATION Result Date: 05/03/2024 INDICATION: Leukocytosis EXAM: FLUOROSCOPIC GUIDED RIGHT ILIAC BONE MARROW ASPIRATION AND CORE BIOPSY Date:  05/03/2024 05/03/2024 10:30 am Radiologist:  M. Frederic Specking, MD Guidance:  Fluoroscopy FLUOROSCOPY: Fluoroscopy Time: 1 minutes 0 seconds (3 mGy). MEDICATIONS: 1% lidocaine local ANESTHESIA/SEDATION: 1.5 mg IV Versed; 50 mcg IV Fentanyl  Moderate Sedation Time:  10 minute The patient was continuously monitored during the procedure by the interventional radiology nurse under my direct supervision. CONTRAST:  None. COMPLICATIONS: None PROCEDURE: Informed consent was obtained from the patient following explanation of the procedure, risks, benefits and alternatives. The patient understands, agrees and consents for the procedure. All questions were addressed. A time out was performed. The patient was positioned prone and fluoroscopic localization was performed of the pelvis to demonstrate the iliac marrow spaces. Maximal barrier sterile technique utilized including caps, mask, sterile gowns, sterile gloves, large sterile drape, hand hygiene, and Betadine prep. Under sterile conditions and local anesthesia, an 11 gauge coaxial bone biopsy needle was advanced into the right iliac marrow space. Needle position was confirmed with fluoroscopic imaging.  Initially, bone marrow aspiration was performed. Next, the 11 gauge outer cannula was utilized to obtain a right iliac bone marrow core biopsy. Needle was removed. Hemostasis was obtained with compression. The patient tolerated the procedure well. Samples were prepared with the cytotechnologist. No immediate complications. IMPRESSION: Fluoroscopically guided right iliac bone marrow aspiration and core biopsy. Electronically Signed   By: CHRISTELLA.  Shick M.D.   On: 05/03/2024 15:09   DG CHEST PORT 1 VIEW Result Date: 05/01/2024 EXAM: 1 VIEW(S) XRAY OF THE CHEST 05/01/2024 04:09:00 PM COMPARISON: 04/28/2024 CLINICAL HISTORY: Fever; per chart: medical history of COPD, Rheumatoid arthritis, diagnosis of PE, aortic thrombosis s/p thrombectomy and suspicion for lymphoproliferative disorder. FINDINGS: LUNGS AND PLEURA: 1 cm right lower lung nodule. No pulmonary edema. No pleural effusion. No pneumothorax. HEART AND MEDIASTINUM: Descending aorta stent noted. No acute abnormality of the cardiac and mediastinal silhouettes. BONES AND SOFT TISSUES: No acute osseous abnormality. IMPRESSION: 1. No acute findings. 2. 1 cm right lower lung nodule is similar to prior, likely corresponding to areas of tree-in-bud nodularity on prior CT. Electronically signed by: Donnice Mania MD 05/01/2024 08:41 PM EDT RP Workstation: [id number]   US  Venous Img Lower  Left (DVT Study) Result Date: 04/28/2024 CLINICAL DATA:  L leg swellingand pain EXAM: LEFT LOWER EXTREMITY VENOUS DOPPLER ULTRASOUND TECHNIQUE: Gray-scale sonography with graded compression, as well as color Doppler and duplex ultrasound were performed to evaluate the lower extremity deep venous systems from the level of the common femoral vein and including the common femoral, femoral, profunda femoral, popliteal and calf veins including the posterior tibial, peroneal and gastrocnemius veins when visible. The superficial great saphenous vein was also interrogated. Spectral Doppler was  utilized to evaluate flow at rest and with distal augmentation maneuvers in the common femoral, femoral and popliteal veins. COMPARISON:  09/30/2023 FINDINGS: Contralateral Common Femoral Vein: Respiratory phasicity is normal and symmetric with the symptomatic side. No evidence of thrombus. Normal compressibility. Common Femoral Vein: No evidence of thrombus. Normal compressibility, respiratory phasicity and response to augmentation. Saphenofemoral Junction: No evidence of thrombus. Normal compressibility and flow on  color Doppler imaging. Profunda Femoral Vein: No evidence of thrombus. Normal compressibility and flow on color Doppler imaging. Femoral Vein: No evidence of thrombus. Normal compressibility, respiratory phasicity and response to augmentation. Popliteal Vein: No evidence of thrombus. Normal compressibility, respiratory phasicity and response to augmentation. Calf Veins: No evidence of thrombus. Normal compressibility and flow on color Doppler imaging. Superficial Great Saphenous Vein: No evidence of thrombus. Normal compressibility. Other Findings:  None. IMPRESSION: Negative for deep venous thrombosis in the left leg. Electronically Signed   By: Rogelia Myers M.D.   On: 04/28/2024 17:59   DG Knee 2 Views Left Result Date: 04/28/2024 CLINICAL DATA:  Left knee pain and swelling. EXAM: LEFT KNEE - 1-2 VIEW COMPARISON:  None Available. FINDINGS: No evidence of fracture or dislocation. There may be a small joint effusion. No evidence of arthropathy or other focal bone abnormality. Mild soft tissue edema. IMPRESSION: Mild soft tissue edema and possible small joint effusion. No acute osseous abnormality. Electronically Signed   By: Andrea Gasman M.D.   On: 04/28/2024 17:20   DG Chest Portable 1 View Result Date: 04/28/2024 EXAM: 1 VIEW(S) XRAY OF THE CHEST 04/28/2024 03:49:47 PM COMPARISON: 02/24/2024 CLINICAL HISTORY: body pain, leukocytosis. pt states that she was here last night for the same  thing, states that she is back for all over body aches and L leg swelling. FINDINGS: LUNGS AND PLEURA: No focal pulmonary opacity. No pulmonary edema. No pleural effusion. No pneumothorax. HEART AND MEDIASTINUM: New aortic stent in place. BONES AND SOFT TISSUES: No acute osseous abnormality. IMPRESSION: 1. No acute cardiopulmonary process. 2. New aortic stent in place. Electronically signed by: Waddell Calk MD 04/28/2024 04:16 PM EDT RP Workstation: [id number]   DG C-Arm 1-60 Min Result Date: 04/12/2024 CLINICAL DATA:  Pedunculated thrombus involving the descending thoracic aorta. EXAM: DG C-ARM 1-60 MIN CONTRAST:  See operative report. FLUOROSCOPY: Radiation Exposure Index (as provided by the fluoroscopic device): 83.984 mGy Kerma COMPARISON:  Chest CTA 03/31/2024 FINDINGS: Right femoral sheath with a pigtail catheter. Angiographic images were obtained at the aortic arch, distal descending thoracic aorta and distal abdominal aorta. A thoracic stent graft was placed in the descending thoracic aorta. Pigtail run demonstrates patency of the aortic arch, arch great vessels and the descending thoracic aortic stent graft. DSA images demonstrate patency of bilateral renal arteries, celiac trunk and SMA. Bilateral common, internal and external iliac arteries are patent. IMPRESSION: Fluoroscopic and DSA images obtained during placement of a stent graft in the descending thoracic aorta. Electronically Signed   By: Juliene Balder M.D.   On: 04/12/2024 17:44   HYBRID OR IMAGING ([facility] ONLY) Result Date: 04/12/2024 There is no interpretation for this exam.  This order is for images obtained during a surgical procedure.  Please See Surgeries Tab for more information regarding the procedure.    Alm Schneider, DO  [facility]  If 7PM-7AM, please contact night-coverage [email or web address] Password [email or web address] 05/03/2024, 4:41 PM   LOS: 1 day

## 2024-05-03 NOTE — TOC Progression Note (Signed)
 Transition of Care [facility]) - Progression Note    Patient Details  Name: Laurie Wallace MRN: [id number] Date of Birth: 1946/05/21  Transition of Care [facility] CM/SW Contact  Sharlyne Stabs, RN Phone Number: 05/03/2024, 11:11 AM  Clinical Narrative:   [facility] expiring tomorrow. Patient is will not be medically ready. CMA following to cancel and restart Auth when patient is medically ready.    Expected Discharge Plan: Skilled Nursing Facility Barriers to Discharge: Continued Medical Work up     Expected Discharge Plan and Services In-house Referral: Clinical Social Work   Post Acute Care Choice: Durable Medical Equipment Living arrangements for the past 2 months: Single Family Home                   Social Drivers of Health (SDOH) Interventions SDOH Screenings   Food Insecurity: No Food Insecurity (04/28/2024)  Housing: Low Risk  (04/28/2024)  Transportation Needs: No Transportation Needs (04/28/2024)  Utilities: Not At Risk (04/28/2024)  Alcohol Screen: Low Risk  (06/14/2023)  Depression (PHQ2-9): Medium Risk (11/08/2023)  Financial Resource Strain: Low Risk  (06/14/2023)  Physical Activity: Sufficiently Active (06/14/2023)  Social Connections: Moderately Isolated (04/28/2024)  Stress: No Stress Concern Present (06/14/2023)  Tobacco Use: Low Risk  (04/28/2024)  Health Literacy: Adequate Health Literacy (06/14/2023)    Readmission Risk Interventions    01/11/2023    2:07 PM  Readmission Risk Prevention Plan  Transportation Screening Complete  PCP or Specialist Appt within 5-7 Days Complete  Home Care Screening Complete  Medication Review (RN CM) Complete

## 2024-05-03 NOTE — Plan of Care (Signed)
   Problem: Activity: Goal: Risk for activity intolerance will decrease Outcome: Progressing   Problem: Coping: Goal: Level of anxiety will decrease Outcome: Progressing

## 2024-05-03 NOTE — Sedation Documentation (Signed)
 [facility] arrived to transport patient to [facility] report given site assessed dressing clean, dry, and intact. Call primary nurse for patient at [facility] [id number] report given.

## 2024-05-04 ENCOUNTER — Inpatient Hospital Stay (HOSPITAL_COMMUNITY)

## 2024-05-04 DIAGNOSIS — M059 Rheumatoid arthritis with rheumatoid factor, unspecified: Secondary | ICD-10-CM | POA: Diagnosis not present

## 2024-05-04 DIAGNOSIS — D471 Chronic myeloproliferative disease: Secondary | ICD-10-CM | POA: Diagnosis not present

## 2024-05-04 DIAGNOSIS — D75839 Thrombocytosis, unspecified: Secondary | ICD-10-CM | POA: Diagnosis not present

## 2024-05-04 DIAGNOSIS — R531 Weakness: Secondary | ICD-10-CM | POA: Diagnosis not present

## 2024-05-04 LAB — BCR-ABL1, CML/ALL, PCR, QUANT

## 2024-05-04 LAB — CBC WITH DIFFERENTIAL/PLATELET
Abs Immature Granulocytes: 7.8 K/uL — ABNORMAL HIGH (ref 0.00–0.07)
Band Neutrophils: 3 %
Basophils Absolute: 0 K/uL (ref 0.0–0.1)
Basophils Relative: 0 %
Eosinophils Absolute: 0 K/uL (ref 0.0–0.5)
Eosinophils Relative: 0 %
HCT: 23.2 % — ABNORMAL LOW (ref 36.0–46.0)
Hemoglobin: 7.3 g/dL — ABNORMAL LOW (ref 12.0–15.0)
Lymphocytes Relative: 8 %
Lymphs Abs: 5.2 K/uL — ABNORMAL HIGH (ref 0.7–4.0)
MCH: 31.6 pg (ref 26.0–34.0)
MCHC: 31.5 g/dL (ref 30.0–36.0)
MCV: 100.4 fL — ABNORMAL HIGH (ref 80.0–100.0)
Metamyelocytes Relative: 7 %
Monocytes Absolute: 0 K/uL — ABNORMAL LOW (ref 0.1–1.0)
Monocytes Relative: 0 %
Myelocytes: 3 %
Neutro Abs: 51.9 K/uL — ABNORMAL HIGH (ref 1.7–7.7)
Neutrophils Relative %: 77 %
Platelets: 903 K/uL (ref 150–400)
Promyelocytes Relative: 2 %
RBC: 2.31 MIL/uL — ABNORMAL LOW (ref 3.87–5.11)
RDW: 20.3 % — ABNORMAL HIGH (ref 11.5–15.5)
WBC: 64.9 K/uL (ref 4.0–10.5)
nRBC: 0.3 % — ABNORMAL HIGH (ref 0.0–0.2)

## 2024-05-04 LAB — URINE CULTURE: Culture: NO GROWTH

## 2024-05-04 LAB — BASIC METABOLIC PANEL WITH GFR
Anion gap: 12 (ref 5–15)
BUN: 16 mg/dL (ref 8–23)
CO2: 26 mmol/L (ref 22–32)
Calcium: 8.9 mg/dL (ref 8.9–10.3)
Chloride: 100 mmol/L (ref 98–111)
Creatinine, Ser: 1.16 mg/dL — ABNORMAL HIGH (ref 0.44–1.00)
GFR, Estimated: 48 mL/min — ABNORMAL LOW (ref >60)
Glucose, Bld: 77 mg/dL (ref 70–99)
Potassium: 5 mmol/L (ref 3.5–5.1)
Sodium: 138 mmol/L (ref 135–145)

## 2024-05-04 LAB — ERYTHROPOIETIN: Erythropoietin: 24.7 m[IU]/mL — ABNORMAL HIGH (ref 2.6–18.5)

## 2024-05-04 NOTE — Progress Notes (Signed)
 PROGRESS NOTE  Laurie Wallace FMW:[id number] DOB: 10/31/1945 DOA: 04/28/2024 PCP: [name], MD  Brief History:   78 y.o. female, with past medical history of COPD, rheumatoid arthritis, diagnosis of PE earlier in the year, and diagnosis of thrombosis status post endovascular stent placement 04/12/2024, patient undergoing workup for leukocytosis with concern of lymphoproliferative malignancy (CML versus CMML), which she missed most recent 2 appointment earlier this week due to not feeling well, and living by herself with family not aware she has appointment scheduled with oncology - Presents to ED secondary to generalized weakness, fatigue, left lower extremity pain, denies fever, chills, chest pain or shortness of breath, reports she is compliant with her Eliquis .   Assessment/Plan: Leukocytosis and thrombocytosis -Concern for CML versus CMML -WBC up to 123K  and platelets up to 1386, discussed with Dr. Davonna with heme/onc and consulting  -Pt says she has missed appts and has not established care with outpatient oncologist - 05/03/24--IR bone marrow bx - discussed with Dr. Davonna and pt started on hydroxyurea 500 mg BID on 05/01/24 -05/03/24 discussed with Dr. Delma d/c until we get some prelim results from bone marrow bx although does not look like acute leukemic conversion right now based on peripheral blood work - 05/03/24 CBC--2% blasts -05/04/24 WBC--64.9, no blasts   Macrocytic anemia -- B12 (1469), folate (>20)   Presumed cancer related pain -- schedule acetaminophen  around the clock    Fever - suspect neoplastic fever - follow and treat supportively - blood cultures neg to date - PCT 0.23>>0.48 - UA 21-50 WBC - follow urine culture -10/27 CXR--no consolidation - PCT 0.23>>0.48    Generalized weakness and deconditioning -check TSH -PT eval>>SNF -B12 1469 -folate >20 -CK     History of pulmonary embolism -09/29/23 CTA  chest--acute bilateral lower lobe segmental and subsegmental pulmonary emboli -Continue apixaban  5 mg BID  -history of recent aortic thrombosis status post TEVAR by Dr. Magda on 04/12/2024    COPD No wheezing Bronchodilators PRN    Rheumatoid arthritis Continue with methotrexate  15 mg weekly (Tuesdays)               Family Communication:   Family at bedside 10/30, niece 10/30   Consultants:  med onc    Code Status:  FULL    DVT Prophylaxis:  apixaban      Procedures: As Listed in Progress Note Above   Antibiotics: None       Subjective: Pt complains of bilateral thigh pain, but is improving.  Denies f/c, cp, sob, n/v/d, abd pain, dysuria, headache  Objective: Vitals:   05/03/24 2204 05/04/24 0333 05/04/24 0906 05/04/24 1319  BP: (!) 99/50 119/73 120/74 (!) 123/47  Pulse: 91 96 97 100  Resp: 18 19 18 16   Temp: 98.5 F (36.9 C) 98.1 F (36.7 C) 98 F (36.7 C) 97.8 F (36.6 C)  TempSrc: Oral Oral Oral Oral  SpO2: 97% 97% 96% 96%  Weight:      Height:        Intake/Output Summary (Last 24 hours) at 05/04/2024 1623 Last data filed at 05/04/2024 0908 Gross per 24 hour  Intake 240 ml  Output 120 ml  Net 120 ml   Weight change:  Exam:  General:  Pt is alert, follows commands appropriately, not in acute distress HEENT: No icterus, No thrush, No neck mass, Morton/AT Cardiovascular: RRR, S1/S2, no rubs, no gallops Respiratory: CTA bilaterally, no wheezing, no crackles, no  rhonchi Abdomen: Soft/+BS, non tender, non distended, no guarding Extremities: No edema, No lymphangitis, No petechiae, No rashes, no synovitis;  extremities warm   Data Reviewed: I have personally reviewed following labs and imaging studies Basic Metabolic Panel: Recent Labs  Lab 04/28/24 1532 04/28/24 1955 04/29/24 0353 05/01/24 0450 05/03/24 0424 05/04/24 0411  NA 137  --  145 145 137 138  K 4.1  --  4.0 3.7 3.9 5.0  CL 101  --  108 107 100 100  CO2 20*  --  25 25 25 26    GLUCOSE 93  --  69* 94 78 77  BUN 11  --  7* <5* 11 16  CREATININE 1.12*  --  0.96 0.95 1.22* 1.16*  CALCIUM 9.1  --  8.6* 8.9 8.7* 8.9  MG  --   --   --   --  2.3  --   PHOS  --  3.7  --   --   --   --    Liver Function Tests: Recent Labs  Lab 04/28/24 1532 05/03/24 0424  AST 30 53*  ALT 9 20  ALKPHOS 112 183*  BILITOT 0.5 0.7  PROT 7.4 6.1*  ALBUMIN 4.2 3.4*   No results for input(s): LIPASE, AMYLASE in the last 168 hours. No results for input(s): AMMONIA in the last 168 hours. Coagulation Profile: Recent Labs  Lab 04/28/24 1955  INR 2.1*   CBC: Recent Labs  Lab 04/29/24 0353 05/01/24 0450 05/02/24 0421 05/03/24 0424 05/04/24 0411  WBC 50.2* 87.8* 123.3* 119.1* 64.9*  NEUTROABS 27.6* 35.1* 71.5* 57.2* 51.9*  HGB 8.4* 8.2* 8.4* 7.6* 7.3*  HCT 26.9* 25.6* 26.8* 23.5* 23.2*  MCV 103.1* 102.4* 100.8* 100.4* 100.4*  PLT 810* 1,212* 1,250* 1,386* 903*   Cardiac Enzymes: No results for input(s): CKTOTAL, CKMB, CKMBINDEX, TROPONINI in the last 168 hours. BNP: Invalid input(s): POCBNP CBG: No results for input(s): GLUCAP in the last 168 hours. HbA1C: No results for input(s): HGBA1C in the last 72 hours. Urine analysis:    Component Value Date/Time   COLORURINE STRAW (A) 04/28/2024 2320   APPEARANCEUR HAZY (A) 04/28/2024 2320   LABSPEC 1.002 (L) 04/28/2024 2320   PHURINE 6.0 04/28/2024 2320   GLUCOSEU NEGATIVE 04/28/2024 2320   HGBUR NEGATIVE 04/28/2024 2320   BILIRUBINUR NEGATIVE 04/28/2024 2320   KETONESUR NEGATIVE 04/28/2024 2320   PROTEINUR NEGATIVE 04/28/2024 2320   NITRITE NEGATIVE 04/28/2024 2320   LEUKOCYTESUR LARGE (A) 04/28/2024 2320   Sepsis Labs: @LABRCNTIP (procalcitonin:4,lacticidven:4) ) Recent Results (from the past 240 hours)  Resp panel by RT-PCR (RSV, Flu A&B, Covid) Anterior Nasal Swab     Status: None   Collection Time: 04/27/24  6:00 PM   Specimen: Anterior Nasal Swab  Result Value Ref Range Status   SARS  Coronavirus 2 by RT PCR NEGATIVE NEGATIVE Final    Comment: (NOTE) SARS-CoV-2 target nucleic acids are NOT DETECTED.  The SARS-CoV-2 RNA is generally detectable in upper respiratory specimens during the acute phase of infection. The lowest concentration of SARS-CoV-2 viral copies this assay can detect is 138 copies/mL. A negative result does not preclude SARS-Cov-2 infection and should not be used as the sole basis for treatment or other patient management decisions. A negative result may occur with  improper specimen collection/handling, submission of specimen other than nasopharyngeal swab, presence of viral mutation(s) within the areas targeted by this assay, and inadequate number of viral copies(<138 copies/mL). A negative result must be combined with clinical observations, patient history, and  epidemiological information. The expected result is Negative.  Fact Sheet for Patients:  bloggercourse.com  Fact Sheet for Healthcare Providers:  seriousbroker.it  This test is no t yet approved or cleared by the [place]  FDA and  has been authorized for detection and/or diagnosis of SARS-CoV-2 by FDA under an Emergency Use Authorization (EUA). This EUA will remain  in effect (meaning this test can be used) for the duration of the COVID-19 declaration under Section 564(b)(1) of the Act, 21 U.S.C.section 360bbb-3(b)(1), unless the authorization is terminated  or revoked sooner.       Influenza A by PCR NEGATIVE NEGATIVE Final   Influenza B by PCR NEGATIVE NEGATIVE Final    Comment: (NOTE) The Xpert Xpress SARS-CoV-2/FLU/RSV plus assay is intended as an aid in the diagnosis of influenza from Nasopharyngeal swab specimens and should not be used as a sole basis for treatment. Nasal washings and aspirates are unacceptable for Xpert Xpress SARS-CoV-2/FLU/RSV testing.  Fact Sheet for  Patients: bloggercourse.com  Fact Sheet for Healthcare Providers: seriousbroker.it  This test is not yet approved or cleared by the [place]  FDA and has been authorized for detection and/or diagnosis of SARS-CoV-2 by FDA under an Emergency Use Authorization (EUA). This EUA will remain in effect (meaning this test can be used) for the duration of the COVID-19 declaration under Section 564(b)(1) of the Act, 21 U.S.C. section 360bbb-3(b)(1), unless the authorization is terminated or revoked.     Resp Syncytial Virus by PCR NEGATIVE NEGATIVE Final    Comment: (NOTE) Fact Sheet for Patients: bloggercourse.com  Fact Sheet for Healthcare Providers: seriousbroker.it  This test is not yet approved or cleared by the [place]  FDA and has been authorized for detection and/or diagnosis of SARS-CoV-2 by FDA under an Emergency Use Authorization (EUA). This EUA will remain in effect (meaning this test can be used) for the duration of the COVID-19 declaration under Section 564(b)(1) of the Act, 21 U.S.C. section 360bbb-3(b)(1), unless the authorization is terminated or revoked.  Performed at [facility], [address] [place], [place] [address]   Culture, blood (Routine X 2) w Reflex to ID Panel     Status: None (Preliminary result)   Collection Time: 05/01/24  4:00 PM   Specimen: BLOOD LEFT HAND  Result Value Ref Range Status   Specimen Description BLOOD LEFT HAND AEROBIC BOTTLE ONLY  Final   Special Requests   Final    Blood Culture results may not be optimal due to an inadequate volume of blood received in culture bottles   Culture   Final    NO GROWTH 3 DAYS Performed at [facility], [address] [place], [place] [address]    Report Status PENDING  Incomplete  Culture, blood (Routine X 2) w Reflex to ID Panel     Status: None (Preliminary result)   Collection Time:  05/01/24  4:00 PM   Specimen: BLOOD LEFT FOREARM  Result Value Ref Range Status   Specimen Description BLOOD LEFT FOREARM AEROBIC BOTTLE ONLY  Final   Special Requests   Final    Blood Culture results may not be optimal due to an inadequate volume of blood received in culture bottles   Culture   Final    NO GROWTH 3 DAYS Performed at [facility], [address], [place], [place] [address]    Report Status PENDING  Incomplete     Scheduled Meds:  acetaminophen   650 mg Oral Q6H   Or   acetaminophen   650 mg  Rectal Q6H   apixaban   5 mg Oral BID   aspirin EC  81 mg Oral Daily   famotidine   20 mg Oral Daily   folic acid   1 mg Oral Daily   hydroxyurea  500 mg Oral BID   loratadine   5 mg Oral Daily   methotrexate   15 mg Oral Q Tue   Continuous Infusions:  Procedures/Studies: IR BONE MARROW BIOPSY & ASPIRATION Result Date: 05/03/2024 INDICATION: Leukocytosis EXAM: FLUOROSCOPIC GUIDED RIGHT ILIAC BONE MARROW ASPIRATION AND CORE BIOPSY Date:  05/03/2024 05/03/2024 10:30 am Radiologist:  M. Frederic Specking, MD Guidance:  Fluoroscopy FLUOROSCOPY: Fluoroscopy Time: 1 minutes 0 seconds (3 mGy). MEDICATIONS: 1% lidocaine local ANESTHESIA/SEDATION: 1.5 mg IV Versed; 50 mcg IV Fentanyl  Moderate Sedation Time:  10 minute The patient was continuously monitored during the procedure by the interventional radiology nurse under my direct supervision. CONTRAST:  None. COMPLICATIONS: None PROCEDURE: Informed consent was obtained from the patient following explanation of the procedure, risks, benefits and alternatives. The patient understands, agrees and consents for the procedure. All questions were addressed. A time out was performed. The patient was positioned prone and fluoroscopic localization was performed of the pelvis to demonstrate the iliac marrow spaces. Maximal barrier sterile technique utilized including caps, mask, sterile gowns, sterile gloves, large sterile drape, hand hygiene, and Betadine prep.  Under sterile conditions and local anesthesia, an 11 gauge coaxial bone biopsy needle was advanced into the right iliac marrow space. Needle position was confirmed with fluoroscopic imaging. Initially, bone marrow aspiration was performed. Next, the 11 gauge outer cannula was utilized to obtain a right iliac bone marrow core biopsy. Needle was removed. Hemostasis was obtained with compression. The patient tolerated the procedure well. Samples were prepared with the cytotechnologist. No immediate complications. IMPRESSION: Fluoroscopically guided right iliac bone marrow aspiration and core biopsy. Electronically Signed   By: CHRISTELLA.  Shick M.D.   On: 05/03/2024 15:09   DG CHEST PORT 1 VIEW Result Date: 05/01/2024 EXAM: 1 VIEW(S) XRAY OF THE CHEST 05/01/2024 04:09:00 PM COMPARISON: 04/28/2024 CLINICAL HISTORY: Fever; per chart: medical history of COPD, Rheumatoid arthritis, diagnosis of PE, aortic thrombosis s/p thrombectomy and suspicion for lymphoproliferative disorder. FINDINGS: LUNGS AND PLEURA: 1 cm right lower lung nodule. No pulmonary edema. No pleural effusion. No pneumothorax. HEART AND MEDIASTINUM: Descending aorta stent noted. No acute abnormality of the cardiac and mediastinal silhouettes. BONES AND SOFT TISSUES: No acute osseous abnormality. IMPRESSION: 1. No acute findings. 2. 1 cm right lower lung nodule is similar to prior, likely corresponding to areas of tree-in-bud nodularity on prior CT. Electronically signed by: Donnice Mania MD 05/01/2024 08:41 PM EDT RP Workstation: [id number]   US  Venous Img Lower  Left (DVT Study) Result Date: 04/28/2024 CLINICAL DATA:  L leg swellingand pain EXAM: LEFT LOWER EXTREMITY VENOUS DOPPLER ULTRASOUND TECHNIQUE: Gray-scale sonography with graded compression, as well as color Doppler and duplex ultrasound were performed to evaluate the lower extremity deep venous systems from the level of the common femoral vein and including the common femoral, femoral, profunda  femoral, popliteal and calf veins including the posterior tibial, peroneal and gastrocnemius veins when visible. The superficial great saphenous vein was also interrogated. Spectral Doppler was utilized to evaluate flow at rest and with distal augmentation maneuvers in the common femoral, femoral and popliteal veins. COMPARISON:  09/30/2023 FINDINGS: Contralateral Common Femoral Vein: Respiratory phasicity is normal and symmetric with the symptomatic side. No evidence of thrombus. Normal compressibility. Common Femoral Vein: No evidence of thrombus. Normal compressibility, respiratory  phasicity and response to augmentation. Saphenofemoral Junction: No evidence of thrombus. Normal compressibility and flow on color Doppler imaging. Profunda Femoral Vein: No evidence of thrombus. Normal compressibility and flow on color Doppler imaging. Femoral Vein: No evidence of thrombus. Normal compressibility, respiratory phasicity and response to augmentation. Popliteal Vein: No evidence of thrombus. Normal compressibility, respiratory phasicity and response to augmentation. Calf Veins: No evidence of thrombus. Normal compressibility and flow on color Doppler imaging. Superficial Great Saphenous Vein: No evidence of thrombus. Normal compressibility. Other Findings:  None. IMPRESSION: Negative for deep venous thrombosis in the left leg. Electronically Signed   By: Rogelia Myers M.D.   On: 04/28/2024 17:59   DG Knee 2 Views Left Result Date: 04/28/2024 CLINICAL DATA:  Left knee pain and swelling. EXAM: LEFT KNEE - 1-2 VIEW COMPARISON:  None Available. FINDINGS: No evidence of fracture or dislocation. There may be a small joint effusion. No evidence of arthropathy or other focal bone abnormality. Mild soft tissue edema. IMPRESSION: Mild soft tissue edema and possible small joint effusion. No acute osseous abnormality. Electronically Signed   By: Andrea Gasman M.D.   On: 04/28/2024 17:20   DG Chest Portable 1 View Result  Date: 04/28/2024 EXAM: 1 VIEW(S) XRAY OF THE CHEST 04/28/2024 03:49:47 PM COMPARISON: 02/24/2024 CLINICAL HISTORY: body pain, leukocytosis. pt states that she was here last night for the same thing, states that she is back for all over body aches and L leg swelling. FINDINGS: LUNGS AND PLEURA: No focal pulmonary opacity. No pulmonary edema. No pleural effusion. No pneumothorax. HEART AND MEDIASTINUM: New aortic stent in place. BONES AND SOFT TISSUES: No acute osseous abnormality. IMPRESSION: 1. No acute cardiopulmonary process. 2. New aortic stent in place. Electronically signed by: Waddell Calk MD 04/28/2024 04:16 PM EDT RP Workstation: [id number]   DG C-Arm 1-60 Min Result Date: 04/12/2024 CLINICAL DATA:  Pedunculated thrombus involving the descending thoracic aorta. EXAM: DG C-ARM 1-60 MIN CONTRAST:  See operative report. FLUOROSCOPY: Radiation Exposure Index (as provided by the fluoroscopic device): 83.984 mGy Kerma COMPARISON:  Chest CTA 03/31/2024 FINDINGS: Right femoral sheath with a pigtail catheter. Angiographic images were obtained at the aortic arch, distal descending thoracic aorta and distal abdominal aorta. A thoracic stent graft was placed in the descending thoracic aorta. Pigtail run demonstrates patency of the aortic arch, arch great vessels and the descending thoracic aortic stent graft. DSA images demonstrate patency of bilateral renal arteries, celiac trunk and SMA. Bilateral common, internal and external iliac arteries are patent. IMPRESSION: Fluoroscopic and DSA images obtained during placement of a stent graft in the descending thoracic aorta. Electronically Signed   By: Juliene Balder M.D.   On: 04/12/2024 17:44   HYBRID OR IMAGING ([facility] ONLY) Result Date: 04/12/2024 There is no interpretation for this exam.  This order is for images obtained during a surgical procedure.  Please See Surgeries Tab for more information regarding the procedure.    Alm Schneider, DO  [facility]  If  7PM-7AM, please contact night-coverage [email or web address] Password [email or web address] 05/04/2024, 4:23 PM   LOS: 2 days

## 2024-05-04 NOTE — Plan of Care (Signed)
   Problem: Activity: Goal: Risk for activity intolerance will decrease Outcome: Progressing   Problem: Coping: Goal: Level of anxiety will decrease Outcome: Progressing

## 2024-05-04 NOTE — Consult Note (Signed)
 Hematology/oncology progress note   Patient is a 78 year old female with concern for lymphoproliferative disorder (CML versus CMML) or MPN, admitted for generalized weakness.  She was accompanied by her brother, sister and goddaughter at bedside today.  She continues to report generalized fatigue and myalgias in bilateral legs and arms.  She denies fever, chills, loss of appetite, loss of weight.  Labs reviewed today:  CBC: WBC: 64.9-improving from prior, hemoglobin: 7.3, platelets: 903-improved from prior.  Patient also has immature cells. CMP: Creatinine: 1.16, uric acid: 8.8, LDH: 879, potassium: 5.0, AST: 53, ALT: 20, alkaline phosphatase: 183  Patient underwent bone marrow biopsy yesterday and awaiting results at this time.  1.  Leukocytosis and thrombocytosis Likely lymphoproliferative disorder like CMML versus CML.  BCR-ABL negative.  JAK2 mutation testing: Negative Worsening leukocytosis and thrombocytosis Counts slightly improved from prior. Awaiting bone marrow biopsy results.   - Continue hydroxyurea 500 mg twice daily. -Trend CBC with differential, CMP, LDH, uric acid daily -Will await for preliminary flow cytometry at this time.  If results are negative for acute leukemia, can discharge patient and will set up outpatient follow-up for her.   2.  Anemia Likely due to lymphoproliferative disorder Anemia panel within normal limits.   - Continue to monitor - Transfuse for hemoglobin less than 7 or less than 8 if symptomatic.  3.  Hyperuricemia Likely secondary to possible tumor lysis from suspected lymphoproliferative disorder  - Start allopurinol 100 mg daily (dose adjusted for creatinine clearance)  Hematology will continue to follow.  Please reach out to Delon Hope, NP with any questions or concerns.  Mickiel Dry, MD Hematology/Oncology [facility]

## 2024-05-05 DIAGNOSIS — M059 Rheumatoid arthritis with rheumatoid factor, unspecified: Secondary | ICD-10-CM | POA: Diagnosis not present

## 2024-05-05 DIAGNOSIS — D471 Chronic myeloproliferative disease: Secondary | ICD-10-CM | POA: Diagnosis not present

## 2024-05-05 DIAGNOSIS — D75839 Thrombocytosis, unspecified: Secondary | ICD-10-CM | POA: Diagnosis not present

## 2024-05-05 DIAGNOSIS — R531 Weakness: Secondary | ICD-10-CM | POA: Diagnosis not present

## 2024-05-05 LAB — BASIC METABOLIC PANEL WITH GFR
Anion gap: 10 (ref 5–15)
BUN: 12 mg/dL (ref 8–23)
CO2: 25 mmol/L (ref 22–32)
Calcium: 8.7 mg/dL — ABNORMAL LOW (ref 8.9–10.3)
Chloride: 104 mmol/L (ref 98–111)
Creatinine, Ser: 1.07 mg/dL — ABNORMAL HIGH (ref 0.44–1.00)
GFR, Estimated: 53 mL/min — ABNORMAL LOW (ref >60)
Glucose, Bld: 69 mg/dL — ABNORMAL LOW (ref 70–99)
Potassium: 4.6 mmol/L (ref 3.5–5.1)
Sodium: 140 mmol/L (ref 135–145)

## 2024-05-05 LAB — CBC
HCT: 22.7 % — ABNORMAL LOW (ref 36.0–46.0)
Hemoglobin: 7 g/dL — ABNORMAL LOW (ref 12.0–15.0)
MCH: 31 pg (ref 26.0–34.0)
MCHC: 30.8 g/dL (ref 30.0–36.0)
MCV: 100.4 fL — ABNORMAL HIGH (ref 80.0–100.0)
Platelets: 1000 K/uL (ref 150–400)
RBC: 2.26 MIL/uL — ABNORMAL LOW (ref 3.87–5.11)
RDW: 20.2 % — ABNORMAL HIGH (ref 11.5–15.5)
WBC: 47.7 K/uL — ABNORMAL HIGH (ref 4.0–10.5)
nRBC: 0.3 % — ABNORMAL HIGH (ref 0.0–0.2)

## 2024-05-05 LAB — TSH: TSH: 1.43 u[IU]/mL (ref 0.350–4.500)

## 2024-05-05 LAB — PREPARE RBC (CROSSMATCH)

## 2024-05-05 LAB — HEMOGLOBIN AND HEMATOCRIT, BLOOD
HCT: 24.2 % — ABNORMAL LOW (ref 36.0–46.0)
Hemoglobin: 8 g/dL — ABNORMAL LOW (ref 12.0–15.0)

## 2024-05-05 LAB — MAGNESIUM: Magnesium: 2.5 mg/dL — ABNORMAL HIGH (ref 1.7–2.4)

## 2024-05-05 LAB — CK: Total CK: 27 U/L — ABNORMAL LOW (ref 38–234)

## 2024-05-05 MED ORDER — SODIUM CHLORIDE 0.9% IV SOLUTION: Freq: Once | INTRAVENOUS | Status: AC

## 2024-05-05 MED ORDER — ENSURE PLUS HIGH PROTEIN PO LIQD
237.0000 mL | Freq: Two times a day (BID) | ORAL | Status: DC
Start: 2024-05-06 — End: 2024-05-09
  Administered 2024-05-06 – 2024-05-07 (×3): 237 mL via ORAL

## 2024-05-05 NOTE — Progress Notes (Signed)
 Mobility Specialist Progress Note:    05/05/24 1040  Mobility  Activity Pivoted/transferred from bed to chair  Level of Assistance Minimal assist, patient does 75% or more  Assistive Device None  Distance Ambulated (ft) 3 ft  Range of Motion/Exercises Active;All extremities  Activity Response Tolerated well  Mobility Referral Yes  Mobility visit 1 Mobility  Mobility Specialist Start Time (ACUTE ONLY) 1040  Mobility Specialist Stop Time (ACUTE ONLY) 1100  Mobility Specialist Time Calculation (min) (ACUTE ONLY) 20 min   Pt received in bed, family in room. Agreeable to mobility, required MinA to stand and transfer with no AD. Tolerated well, RLE weakness. Call bell in reach, NT in room. All needs met.  Drema Eddington Mobility Specialist Please contact via Special Educational Needs Teacher or  Rehab office at [phone]

## 2024-05-05 NOTE — Progress Notes (Signed)
 PROGRESS NOTE  Laurie Wallace FMW:[id number] DOB: Oct 26, 1945 DOA: 04/28/2024 PCP: [name], MD  Brief History:   78 y.o. female, with past medical history of COPD, rheumatoid arthritis, diagnosis of PE earlier in the year, and diagnosis of thrombosis status post endovascular stent placement 04/12/2024, patient undergoing workup for leukocytosis with concern of lymphoproliferative malignancy (CML versus CMML), which she missed most recent 2 appointment earlier this week due to not feeling well, and living by herself with family not aware she has appointment scheduled with oncology - Presents to ED secondary to generalized weakness, fatigue, left lower extremity pain, denies fever, chills, chest pain or shortness of breath, reports she is compliant with her Eliquis .   Assessment/Plan: Leukocytosis and thrombocytosis -Concern for CML versus CMML -WBC peaked at 123K  and platelets up to 1386, discussed with Dr. Davonna with heme/onc and consulting  -Pt says she has missed appts and has not established care with outpatient oncologist - 05/03/24--IR bone marrow bx - discussed with Dr. Davonna and pt started on hydroxyurea 500 mg BID on 05/01/24 -05/03/24 discussed with Dr. Delma d/c until we get some prelim results from bone marrow bx although does not look like acute leukemic conversion right now based on peripheral blood work - 05/03/24 CBC--2% blasts -05/04/24 WBC--64.9, no blasts -05/05/24 WBC--47.7   Macrocytic anemia -- B12 (1469), folate (>20) --10/31 transfuse one unit PRBC   Presumed cancer related pain -- schedule acetaminophen  around the clock    Fever - suspect neoplastic fever - follow and treat supportively - blood cultures neg to date - PCT 0.23>>0.48 - UA 21-50 WBC - follow urine culture -10/27 CXR--no consolidation - PCT 0.23>>0.48    Generalized weakness and deconditioning -check TSH -PT eval>>SNF -B12 1469 -folate  >20 -CK--27     History of pulmonary embolism -09/29/23 CTA chest--acute bilateral lower lobe segmental and subsegmental pulmonary emboli -Continue apixaban  5 mg BID  -history of recent aortic thrombosis status post TEVAR by Dr. Magda on 04/12/2024    COPD No wheezing Bronchodilators PRN    Rheumatoid arthritis Continue with methotrexate  15 mg weekly (Tuesdays)               Family Communication:   Family at bedside 10/31, niece 10/31   Consultants:  med onc    Code Status:  FULL    DVT Prophylaxis:  apixaban      Procedures: As Listed in Progress Note Above   Antibiotics: None        Subjective: Patient denies fevers, chills, headache, chest pain, dyspnea, nausea, vomiting, diarrhea, abdominal pain, dysuria, hematuria, hematochezia, and melena.   Objective: Vitals:   05/05/24 1234 05/05/24 1352 05/05/24 1414 05/05/24 1620  BP: (!) 110/43 (!) 113/41 (!) 100/43 (!) 117/52  Pulse: (!) 101 88 90 99  Resp: 18 18 18 18   Temp: 98.1 F (36.7 C) (!) 97.5 F (36.4 C) 98.4 F (36.9 C) 98.5 F (36.9 C)  TempSrc: Oral Oral Oral Oral  SpO2: 95% 99% 99% 98%  Weight:      Height:        Intake/Output Summary (Last 24 hours) at 05/05/2024 1738 Last data filed at 05/05/2024 1712 Gross per 24 hour  Intake 1965 ml  Output 1 ml  Net 1964 ml   Weight change:  Exam:  General:  Pt is alert, follows commands appropriately, not in acute distress HEENT: No icterus, No thrush, No neck mass, [place]/AT Cardiovascular: RRR, S1/S2,  no rubs, no gallops Respiratory: CTA bilaterally, no wheezing, no crackles, no rhonchi Abdomen: Soft/+BS, non tender, non distended, no guarding Extremities: No edema, No lymphangitis, No petechiae, No rashes, no synovitis   Data Reviewed: I have personally reviewed following labs and imaging studies Basic Metabolic Panel: Recent Labs  Lab 04/28/24 1955 04/29/24 0353 05/01/24 0450 05/03/24 0424 05/04/24 0411 05/05/24 0424  NA  --   145 145 137 138 140  K  --  4.0 3.7 3.9 5.0 4.6  CL  --  108 107 100 100 104  CO2  --  25 25 25 26 25   GLUCOSE  --  69* 94 78 77 69*  BUN  --  7* <5* 11 16 12   CREATININE  --  0.96 0.95 1.22* 1.16* 1.07*  CALCIUM  --  8.6* 8.9 8.7* 8.9 8.7*  MG  --   --   --  2.3  --  2.5*  PHOS 3.7  --   --   --   --   --    Liver Function Tests: Recent Labs  Lab 05/03/24 0424  AST 53*  ALT 20  ALKPHOS 183*  BILITOT 0.7  PROT 6.1*  ALBUMIN 3.4*   No results for input(s): LIPASE, AMYLASE in the last 168 hours. No results for input(s): AMMONIA in the last 168 hours. Coagulation Profile: Recent Labs  Lab 04/28/24 1955  INR 2.1*   CBC: Recent Labs  Lab 04/29/24 0353 05/01/24 0450 05/02/24 0421 05/03/24 0424 05/04/24 0411 05/05/24 0424  WBC 50.2* 87.8* 123.3* 119.1* 64.9* 47.7*  NEUTROABS 27.6* 35.1* 71.5* 57.2* 51.9*  --   HGB 8.4* 8.2* 8.4* 7.6* 7.3* 7.0*  HCT 26.9* 25.6* 26.8* 23.5* 23.2* 22.7*  MCV 103.1* 102.4* 100.8* 100.4* 100.4* 100.4*  PLT 810* 1,212* 1,250* 1,386* 903* 1,000*   Cardiac Enzymes: Recent Labs  Lab 05/05/24 0424  CKTOTAL 27*   BNP: Invalid input(s): POCBNP CBG: No results for input(s): GLUCAP in the last 168 hours. HbA1C: No results for input(s): HGBA1C in the last 72 hours. Urine analysis:    Component Value Date/Time   COLORURINE STRAW (A) 04/28/2024 2320   APPEARANCEUR HAZY (A) 04/28/2024 2320   LABSPEC 1.002 (L) 04/28/2024 2320   PHURINE 6.0 04/28/2024 2320   GLUCOSEU NEGATIVE 04/28/2024 2320   HGBUR NEGATIVE 04/28/2024 2320   BILIRUBINUR NEGATIVE 04/28/2024 2320   KETONESUR NEGATIVE 04/28/2024 2320   PROTEINUR NEGATIVE 04/28/2024 2320   NITRITE NEGATIVE 04/28/2024 2320   LEUKOCYTESUR LARGE (A) 04/28/2024 2320   Sepsis Labs: @LABRCNTIP (procalcitonin:4,lacticidven:4) ) Recent Results (from the past 240 hours)  Resp panel by RT-PCR (RSV, Flu A&B, Covid) Anterior Nasal Swab     Status: None   Collection Time: 04/27/24  6:00  PM   Specimen: Anterior Nasal Swab  Result Value Ref Range Status   SARS Coronavirus 2 by RT PCR NEGATIVE NEGATIVE Final    Comment: (NOTE) SARS-CoV-2 target nucleic acids are NOT DETECTED.  The SARS-CoV-2 RNA is generally detectable in upper respiratory specimens during the acute phase of infection. The lowest concentration of SARS-CoV-2 viral copies this assay can detect is 138 copies/mL. A negative result does not preclude SARS-Cov-2 infection and should not be used as the sole basis for treatment or other patient management decisions. A negative result may occur with  improper specimen collection/handling, submission of specimen other than nasopharyngeal swab, presence of viral mutation(s) within the areas targeted by this assay, and inadequate number of viral copies(<138 copies/mL). A negative result must be  combined with clinical observations, patient history, and epidemiological information. The expected result is Negative.  Fact Sheet for Patients:  bloggercourse.com  Fact Sheet for Healthcare Providers:  seriousbroker.it  This test is no t yet approved or cleared by the [place]  FDA and  has been authorized for detection and/or diagnosis of SARS-CoV-2 by FDA under an Emergency Use Authorization (EUA). This EUA will remain  in effect (meaning this test can be used) for the duration of the COVID-19 declaration under Section 564(b)(1) of the Act, 21 U.S.C.section 360bbb-3(b)(1), unless the authorization is terminated  or revoked sooner.       Influenza A by PCR NEGATIVE NEGATIVE Final   Influenza B by PCR NEGATIVE NEGATIVE Final    Comment: (NOTE) The Xpert Xpress SARS-CoV-2/FLU/RSV plus assay is intended as an aid in the diagnosis of influenza from Nasopharyngeal swab specimens and should not be used as a sole basis for treatment. Nasal washings and aspirates are unacceptable for Xpert Xpress  SARS-CoV-2/FLU/RSV testing.  Fact Sheet for Patients: bloggercourse.com  Fact Sheet for Healthcare Providers: seriousbroker.it  This test is not yet approved or cleared by the [place]  FDA and has been authorized for detection and/or diagnosis of SARS-CoV-2 by FDA under an Emergency Use Authorization (EUA). This EUA will remain in effect (meaning this test can be used) for the duration of the COVID-19 declaration under Section 564(b)(1) of the Act, 21 U.S.C. section 360bbb-3(b)(1), unless the authorization is terminated or revoked.     Resp Syncytial Virus by PCR NEGATIVE NEGATIVE Final    Comment: (NOTE) Fact Sheet for Patients: bloggercourse.com  Fact Sheet for Healthcare Providers: seriousbroker.it  This test is not yet approved or cleared by the [place]  FDA and has been authorized for detection and/or diagnosis of SARS-CoV-2 by FDA under an Emergency Use Authorization (EUA). This EUA will remain in effect (meaning this test can be used) for the duration of the COVID-19 declaration under Section 564(b)(1) of the Act, 21 U.S.C. section 360bbb-3(b)(1), unless the authorization is terminated or revoked.  Performed at [facility], [address] [place], [place] [address]   Culture, blood (Routine X 2) w Reflex to ID Panel     Status: None (Preliminary result)   Collection Time: 05/01/24  4:00 PM   Specimen: BLOOD LEFT HAND  Result Value Ref Range Status   Specimen Description BLOOD LEFT HAND AEROBIC BOTTLE ONLY  Final   Special Requests   Final    Blood Culture results may not be optimal due to an inadequate volume of blood received in culture bottles   Culture   Final    NO GROWTH 4 DAYS Performed at [facility], [address] [place], [place] [address]    Report Status PENDING  Incomplete  Culture, blood (Routine X 2) w Reflex to ID Panel     Status: None  (Preliminary result)   Collection Time: 05/01/24  4:00 PM   Specimen: BLOOD LEFT FOREARM  Result Value Ref Range Status   Specimen Description BLOOD LEFT FOREARM AEROBIC BOTTLE ONLY  Final   Special Requests   Final    Blood Culture results may not be optimal due to an inadequate volume of blood received in culture bottles   Culture   Final    NO GROWTH 4 DAYS Performed at [facility], [address], [place], [place] [address]    Report Status PENDING  Incomplete  Urine Culture (for pregnant, neutropenic or urologic patients or patients with an indwelling urinary  catheter)     Status: None   Collection Time: 05/03/24  3:07 PM   Specimen: Urine, Clean Catch  Result Value Ref Range Status   Specimen Description   Final    URINE, CLEAN CATCH Performed at [facility], [address], [place], [place] [address]    Special Requests   Final    NONE Performed at [facility], [address], [place], [place] [address]    Culture   Final    NO GROWTH Performed at [facility], [address], [place], [place] [address]    Report Status 05/04/2024 FINAL  Final     Scheduled Meds:  acetaminophen   650 mg Oral Q6H   Or   acetaminophen   650 mg Rectal Q6H   apixaban   5 mg Oral BID   aspirin EC  81 mg Oral Daily   famotidine   20 mg Oral Daily   [START ON 05/06/2024] feeding supplement  237 mL Oral BID BM   folic acid   1 mg Oral Daily   hydroxyurea  500 mg Oral BID   loratadine   5 mg Oral Daily   methotrexate   15 mg Oral Q Tue   Continuous Infusions:  Procedures/Studies: US  Venous Img Lower Unilateral Right (DVT) Result Date: 05/05/2024 CLINICAL DATA:  Right lower extremity pain. EXAM: RIGHT LOWER EXTREMITY VENOUS DOPPLER ULTRASOUND TECHNIQUE: Gray-scale sonography with graded compression, as well as color Doppler and duplex ultrasound were performed to evaluate the lower extremity deep venous systems from the level of the common femoral vein and including the common femoral,  femoral, profunda femoral, popliteal and calf veins including the posterior tibial, peroneal and gastrocnemius veins when visible. The superficial great saphenous vein was also interrogated. Spectral Doppler was utilized to evaluate flow at rest and with distal augmentation maneuvers in the common femoral, femoral and popliteal veins. COMPARISON:  None Available. FINDINGS: Contralateral Common Femoral Vein: Respiratory phasicity is normal and symmetric with the symptomatic side. No evidence of thrombus. Normal compressibility. Common Femoral Vein: No evidence of thrombus. Normal compressibility, respiratory phasicity and response to augmentation. Saphenofemoral Junction: No evidence of thrombus. Normal compressibility and flow on color Doppler imaging. Profunda Femoral Vein: No evidence of thrombus. Normal compressibility and flow on color Doppler imaging. Femoral Vein: No evidence of thrombus. Normal compressibility, respiratory phasicity and response to augmentation. Popliteal Vein: No evidence of thrombus. Normal compressibility, respiratory phasicity and response to augmentation. Calf Veins: No evidence of thrombus. Normal compressibility and flow on color Doppler imaging. Superficial Great Saphenous Vein: No evidence of thrombus. Normal compressibility. Venous Reflux:  None. Other Findings: No evidence of superficial thrombophlebitis or abnormal fluid collection. IMPRESSION: No evidence of right lower extremity deep venous thrombosis. Electronically Signed   By: Marcey Moan M.D.   On: 05/05/2024 08:18   IR BONE MARROW BIOPSY & ASPIRATION Result Date: 05/03/2024 INDICATION: Leukocytosis EXAM: FLUOROSCOPIC GUIDED RIGHT ILIAC BONE MARROW ASPIRATION AND CORE BIOPSY Date:  05/03/2024 05/03/2024 10:30 am Radiologist:  M. Frederic Specking, MD Guidance:  Fluoroscopy FLUOROSCOPY: Fluoroscopy Time: 1 minutes 0 seconds (3 mGy). MEDICATIONS: 1% lidocaine local ANESTHESIA/SEDATION: 1.5 mg IV Versed; 50 mcg IV Fentanyl   Moderate Sedation Time:  10 minute The patient was continuously monitored during the procedure by the interventional radiology nurse under my direct supervision. CONTRAST:  None. COMPLICATIONS: None PROCEDURE: Informed consent was obtained from the patient following explanation of the procedure, risks, benefits and alternatives. The patient understands, agrees and consents for the procedure. All questions were addressed. A  time out was performed. The patient was positioned prone and fluoroscopic localization was performed of the pelvis to demonstrate the iliac marrow spaces. Maximal barrier sterile technique utilized including caps, mask, sterile gowns, sterile gloves, large sterile drape, hand hygiene, and Betadine prep. Under sterile conditions and local anesthesia, an 11 gauge coaxial bone biopsy needle was advanced into the right iliac marrow space. Needle position was confirmed with fluoroscopic imaging. Initially, bone marrow aspiration was performed. Next, the 11 gauge outer cannula was utilized to obtain a right iliac bone marrow core biopsy. Needle was removed. Hemostasis was obtained with compression. The patient tolerated the procedure well. Samples were prepared with the cytotechnologist. No immediate complications. IMPRESSION: Fluoroscopically guided right iliac bone marrow aspiration and core biopsy. Electronically Signed   By: CHRISTELLA.  Shick M.D.   On: 05/03/2024 15:09   DG CHEST PORT 1 VIEW Result Date: 05/01/2024 EXAM: 1 VIEW(S) XRAY OF THE CHEST 05/01/2024 04:09:00 PM COMPARISON: 04/28/2024 CLINICAL HISTORY: Fever; per chart: medical history of COPD, Rheumatoid arthritis, diagnosis of PE, aortic thrombosis s/p thrombectomy and suspicion for lymphoproliferative disorder. FINDINGS: LUNGS AND PLEURA: 1 cm right lower lung nodule. No pulmonary edema. No pleural effusion. No pneumothorax. HEART AND MEDIASTINUM: Descending aorta stent noted. No acute abnormality of the cardiac and mediastinal silhouettes.  BONES AND SOFT TISSUES: No acute osseous abnormality. IMPRESSION: 1. No acute findings. 2. 1 cm right lower lung nodule is similar to prior, likely corresponding to areas of tree-in-bud nodularity on prior CT. Electronically signed by: Donnice Mania MD 05/01/2024 08:41 PM EDT RP Workstation: [id number]   US  Venous Img Lower  Left (DVT Study) Result Date: 04/28/2024 CLINICAL DATA:  L leg swellingand pain EXAM: LEFT LOWER EXTREMITY VENOUS DOPPLER ULTRASOUND TECHNIQUE: Gray-scale sonography with graded compression, as well as color Doppler and duplex ultrasound were performed to evaluate the lower extremity deep venous systems from the level of the common femoral vein and including the common femoral, femoral, profunda femoral, popliteal and calf veins including the posterior tibial, peroneal and gastrocnemius veins when visible. The superficial great saphenous vein was also interrogated. Spectral Doppler was utilized to evaluate flow at rest and with distal augmentation maneuvers in the common femoral, femoral and popliteal veins. COMPARISON:  09/30/2023 FINDINGS: Contralateral Common Femoral Vein: Respiratory phasicity is normal and symmetric with the symptomatic side. No evidence of thrombus. Normal compressibility. Common Femoral Vein: No evidence of thrombus. Normal compressibility, respiratory phasicity and response to augmentation. Saphenofemoral Junction: No evidence of thrombus. Normal compressibility and flow on color Doppler imaging. Profunda Femoral Vein: No evidence of thrombus. Normal compressibility and flow on color Doppler imaging. Femoral Vein: No evidence of thrombus. Normal compressibility, respiratory phasicity and response to augmentation. Popliteal Vein: No evidence of thrombus. Normal compressibility, respiratory phasicity and response to augmentation. Calf Veins: No evidence of thrombus. Normal compressibility and flow on color Doppler imaging. Superficial Great Saphenous Vein: No evidence  of thrombus. Normal compressibility. Other Findings:  None. IMPRESSION: Negative for deep venous thrombosis in the left leg. Electronically Signed   By: Rogelia Myers M.D.   On: 04/28/2024 17:59   DG Knee 2 Views Left Result Date: 04/28/2024 CLINICAL DATA:  Left knee pain and swelling. EXAM: LEFT KNEE - 1-2 VIEW COMPARISON:  None Available. FINDINGS: No evidence of fracture or dislocation. There may be a small joint effusion. No evidence of arthropathy or other focal bone abnormality. Mild soft tissue edema. IMPRESSION: Mild soft tissue edema and possible small joint effusion. No acute osseous abnormality. Electronically Signed  By: Andrea Gasman M.D.   On: 04/28/2024 17:20   DG Chest Portable 1 View Result Date: 04/28/2024 EXAM: 1 VIEW(S) XRAY OF THE CHEST 04/28/2024 03:49:47 PM COMPARISON: 02/24/2024 CLINICAL HISTORY: body pain, leukocytosis. pt states that she was here last night for the same thing, states that she is back for all over body aches and L leg swelling. FINDINGS: LUNGS AND PLEURA: No focal pulmonary opacity. No pulmonary edema. No pleural effusion. No pneumothorax. HEART AND MEDIASTINUM: New aortic stent in place. BONES AND SOFT TISSUES: No acute osseous abnormality. IMPRESSION: 1. No acute cardiopulmonary process. 2. New aortic stent in place. Electronically signed by: Waddell Calk MD 04/28/2024 04:16 PM EDT RP Workstation: [id number]   DG C-Arm 1-60 Min Result Date: 04/12/2024 CLINICAL DATA:  Pedunculated thrombus involving the descending thoracic aorta. EXAM: DG C-ARM 1-60 MIN CONTRAST:  See operative report. FLUOROSCOPY: Radiation Exposure Index (as provided by the fluoroscopic device): 83.984 mGy Kerma COMPARISON:  Chest CTA 03/31/2024 FINDINGS: Right femoral sheath with a pigtail catheter. Angiographic images were obtained at the aortic arch, distal descending thoracic aorta and distal abdominal aorta. A thoracic stent graft was placed in the descending thoracic aorta. Pigtail  run demonstrates patency of the aortic arch, arch great vessels and the descending thoracic aortic stent graft. DSA images demonstrate patency of bilateral renal arteries, celiac trunk and SMA. Bilateral common, internal and external iliac arteries are patent. IMPRESSION: Fluoroscopic and DSA images obtained during placement of a stent graft in the descending thoracic aorta. Electronically Signed   By: Juliene Balder M.D.   On: 04/12/2024 17:44   HYBRID OR IMAGING ([facility] ONLY) Result Date: 04/12/2024 There is no interpretation for this exam.  This order is for images obtained during a surgical procedure.  Please See Surgeries Tab for more information regarding the procedure.    Alm Schneider, DO  [facility]  If 7PM-7AM, please contact night-coverage [email or web address] Password [email or web address] 05/05/2024, 5:38 PM   LOS: 3 days

## 2024-05-05 NOTE — Progress Notes (Signed)
 Mobility Specialist Progress Note:    05/05/24 1230  Mobility  Activity Ambulated with assistance  Level of Assistance Minimal assist, patient does 75% or more  Assistive Device Front wheel walker  Distance Ambulated (ft) 30 ft  Range of Motion/Exercises Active;All extremities  Activity Response Tolerated well  Mobility Referral Yes  Mobility visit 1 Mobility  Mobility Specialist Start Time (ACUTE ONLY) 1230  Mobility Specialist Stop Time (ACUTE ONLY) 1250  Mobility Specialist Time Calculation (min) (ACUTE ONLY) 20 min   Pt received in chair, agreeable to mobility. Required MinA to stand and SBA to ambulate with with RW. Tolerated well, slow labored movements d/t RLE pain/weakness. Returned to chair, all needs met.  Nguyet Mercer Mobility Specialist Please contact via Special Educational Needs Teacher or  Rehab office at [phone]

## 2024-05-05 NOTE — Care Management Important Message (Signed)
 Important Message  Patient Details  Name: Laurie Wallace MRN: [id number] Date of Birth: 07/13/1945   Important Message Given:  Yes - Medicare IM     Anaise Sterbenz L Sharanya Templin 05/05/2024, 4:24 PM

## 2024-05-06 DIAGNOSIS — D75839 Thrombocytosis, unspecified: Secondary | ICD-10-CM | POA: Diagnosis not present

## 2024-05-06 DIAGNOSIS — D471 Chronic myeloproliferative disease: Secondary | ICD-10-CM | POA: Diagnosis not present

## 2024-05-06 DIAGNOSIS — R531 Weakness: Secondary | ICD-10-CM | POA: Diagnosis not present

## 2024-05-06 LAB — CBC WITH DIFFERENTIAL/PLATELET
Band Neutrophils: 6 %
Basophils Absolute: 0 K/uL (ref 0.0–0.1)
Basophils Relative: 0 %
Blasts: 8 %
Eosinophils Absolute: 1.2 K/uL — ABNORMAL HIGH (ref 0.0–0.5)
Eosinophils Relative: 3 %
HCT: 24.6 % — ABNORMAL LOW (ref 36.0–46.0)
Hemoglobin: 8.1 g/dL — ABNORMAL LOW (ref 12.0–15.0)
Lymphocytes Relative: 10 %
Lymphs Abs: 3.9 K/uL (ref 0.7–4.0)
MCH: 30 pg (ref 26.0–34.0)
MCHC: 32.9 g/dL (ref 30.0–36.0)
MCV: 91.1 fL (ref 80.0–100.0)
Monocytes Absolute: 3.1 K/uL — ABNORMAL HIGH (ref 0.1–1.0)
Monocytes Relative: 8 %
Neutro Abs: 27.3 K/uL — ABNORMAL HIGH (ref 1.7–7.7)
Neutrophils Relative %: 65 %
Platelets: 1124 K/uL (ref 150–400)
RBC: 2.7 MIL/uL — ABNORMAL LOW (ref 3.87–5.11)
RDW: 22.9 % — ABNORMAL HIGH (ref 11.5–15.5)
WBC: 38.5 K/uL — ABNORMAL HIGH (ref 4.0–10.5)
nRBC: 0.2 % (ref 0.0–0.2)

## 2024-05-06 LAB — CULTURE, BLOOD (ROUTINE X 2)
Culture: NO GROWTH
Culture: NO GROWTH

## 2024-05-06 LAB — TYPE AND SCREEN
ABO/RH(D): O POS
Antibody Screen: NEGATIVE
Unit division: 0

## 2024-05-06 LAB — BASIC METABOLIC PANEL WITH GFR
Anion gap: 10 (ref 5–15)
BUN: 12 mg/dL (ref 8–23)
CO2: 26 mmol/L (ref 22–32)
Calcium: 8.7 mg/dL — ABNORMAL LOW (ref 8.9–10.3)
Chloride: 103 mmol/L (ref 98–111)
Creatinine, Ser: 1.2 mg/dL — ABNORMAL HIGH (ref 0.44–1.00)
GFR, Estimated: 46 mL/min — ABNORMAL LOW (ref >60)
Glucose, Bld: 78 mg/dL (ref 70–99)
Potassium: 4.8 mmol/L (ref 3.5–5.1)
Sodium: 139 mmol/L (ref 135–145)

## 2024-05-06 LAB — BPAM RBC
Blood Product Expiration Date: 202511032359
ISSUE DATE / TIME: 202510311353
Unit Type and Rh: 5100

## 2024-05-06 LAB — MAGNESIUM: Magnesium: 2.3 mg/dL (ref 1.7–2.4)

## 2024-05-06 MED ORDER — POLYETHYLENE GLYCOL 3350 17 G PO PACK
17.0000 g | PACK | Freq: Every day | ORAL | Status: DC
  Administered 2024-05-06 – 2024-05-07 (×2): 17 g via ORAL
  Filled 2024-05-06 (×3): qty 1

## 2024-05-06 MED ORDER — SENNA 8.6 MG PO TABS
2.0000 | ORAL_TABLET | Freq: Every day | ORAL | Status: DC
  Administered 2024-05-06 – 2024-05-08 (×3): 17.2 mg via ORAL
  Filled 2024-05-06 (×3): qty 2

## 2024-05-06 NOTE — Plan of Care (Signed)
  Problem: Education: Goal: Knowledge of General Education information will improve Description: Including pain rating scale, medication(s)/side effects and non-pharmacologic comfort measures Outcome: Progressing   Problem: Clinical Measurements: Goal: Ability to maintain clinical measurements within normal limits will improve Outcome: Progressing Goal: Will remain free from infection Outcome: Progressing Goal: Diagnostic test results will improve Outcome: Progressing Goal: Respiratory complications will improve Outcome: Progressing   Problem: Nutrition: Goal: Adequate nutrition will be maintained Outcome: Progressing   Problem: Safety: Goal: Ability to remain free from injury will improve Outcome: Progressing

## 2024-05-06 NOTE — Plan of Care (Signed)

## 2024-05-06 NOTE — Progress Notes (Signed)
 PROGRESS NOTE  Laurie Wallace FMW:[id number] DOB: 1946/01/02 DOA: 04/28/2024 PCP: [name], MD  Brief History:   78 y.o. female, with past medical history of COPD, rheumatoid arthritis, diagnosis of PE earlier in the year, and diagnosis of thrombosis status post endovascular stent placement 04/12/2024, patient undergoing workup for leukocytosis with concern of lymphoproliferative malignancy (CML versus CMML), which she missed most recent 2 appointment earlier this week due to not feeling well, and living by herself with family not aware she has appointment scheduled with oncology - Presents to ED secondary to generalized weakness, fatigue, left lower extremity pain, denies fever, chills, chest pain or shortness of breath, reports she is compliant with her Eliquis .   Assessment/Plan:  Leukocytosis and thrombocytosis -Concern for CML versus CMML -WBC peaked at 123K  and platelets up to 1386, discussed with Dr. Davonna with heme/onc and consulting  -Pt says she has missed appts and has not established care with outpatient oncologist - 05/03/24--IR bone marrow bx - discussed with Dr. Davonna and pt started on hydroxyurea 500 mg BID on 05/01/24 -05/03/24 discussed with Dr. Delma d/c until we get some prelim results from bone marrow bx although does not look like acute leukemic conversion right now based on peripheral blood work - 05/03/24 CBC--2% blasts -05/04/24 WBC--64.9, no blasts -05/05/24 WBC--47.7 - 05/06/24 WBC 38.5, 8 % blasts   Macrocytic anemia -- B12 (1469), folate (>20) --10/31 transfused one unit PRBC   Presumed cancer related pain -- schedule acetaminophen  around the clock    Fever - suspect neoplastic fever - follow and treat supportively - blood cultures neg to date - PCT 0.23>>0.48 - UA 21-50 WBC - follow urine culture--neg -10/27 CXR--no consolidation - PCT 0.23>>0.48 - overall improved    Generalized weakness and  deconditioning -TSH 1.430 -PT eval>>SNF -B12 1469 -folate >20 -CK--27     History of pulmonary embolism -09/29/23 CTA chest--acute bilateral lower lobe segmental and subsegmental pulmonary emboli -Continue apixaban  5 mg BID  -history of recent aortic thrombosis status post TEVAR by Dr. Magda on 04/12/2024    COPD No wheezing Bronchodilators PRN    Rheumatoid arthritis Continue with methotrexate  15 mg weekly (Tuesdays)               Family Communication:   Family at bedside 11/1   Consultants:  med onc    Code Status:  FULL    DVT Prophylaxis:  apixaban      Procedures: As Listed in Progress Note Above   Antibiotics: None           Subjective: Patient denies fevers, chills, headache, chest pain, dyspnea, nausea, vomiting, diarrhea, abdominal pain, dysuria, hematuria, hematochezia, and melena. Pt states her leg pain is improving  Objective: Vitals:   05/05/24 1620 05/05/24 2014 05/06/24 0340 05/06/24 1416  BP: (!) 117/52 (!) 123/48 (!) 107/58 (!) 111/52  Pulse: 99 87 89 85  Resp: 18 15 16 17   Temp: 98.5 F (36.9 C) (!) 97.4 F (36.3 C) 97.9 F (36.6 C) 98.3 F (36.8 C)  TempSrc: Oral Oral  Oral  SpO2: 98% 100% 98% 100%  Weight:      Height:        Intake/Output Summary (Last 24 hours) at 05/06/2024 1726 Last data filed at 05/06/2024 1240 Gross per 24 hour  Intake 960 ml  Output --  Net 960 ml   Weight change:  Exam:  General:  Pt is alert, follows commands appropriately,  not in acute distress HEENT: No icterus, No thrush, No neck mass, [place]/AT Cardiovascular: RRR, S1/S2, no rubs, no gallops Respiratory: CTA bilaterally, no wheezing, no crackles, no rhonchi Abdomen: Soft/+BS, non tender, non distended, no guarding Extremities: No edema, No lymphangitis, No petechiae, No rashes, no synovitis   Data Reviewed: I have personally reviewed following labs and imaging studies Basic Metabolic Panel: Recent Labs  Lab 05/01/24 0450  05/03/24 0424 05/04/24 0411 05/05/24 0424 05/06/24 0334  NA 145 137 138 140 139  K 3.7 3.9 5.0 4.6 4.8  CL 107 100 100 104 103  CO2 25 25 26 25 26   GLUCOSE 94 78 77 69* 78  BUN <5* 11 16 12 12   CREATININE 0.95 1.22* 1.16* 1.07* 1.20*  CALCIUM 8.9 8.7* 8.9 8.7* 8.7*  MG  --  2.3  --  2.5* 2.3   Liver Function Tests: Recent Labs  Lab 05/03/24 0424  AST 53*  ALT 20  ALKPHOS 183*  BILITOT 0.7  PROT 6.1*  ALBUMIN 3.4*   No results for input(s): LIPASE, AMYLASE in the last 168 hours. No results for input(s): AMMONIA in the last 168 hours. Coagulation Profile: No results for input(s): INR, PROTIME in the last 168 hours. CBC: Recent Labs  Lab 05/01/24 0450 05/02/24 0421 05/03/24 0424 05/04/24 0411 05/05/24 0424 05/05/24 1850 05/06/24 0334  WBC 87.8* 123.3* 119.1* 64.9* 47.7*  --  38.5*  NEUTROABS 35.1* 71.5* 57.2* 51.9*  --   --  27.3*  HGB 8.2* 8.4* 7.6* 7.3* 7.0* 8.0* 8.1*  HCT 25.6* 26.8* 23.5* 23.2* 22.7* 24.2* 24.6*  MCV 102.4* 100.8* 100.4* 100.4* 100.4*  --  91.1  PLT 1,212* 1,250* 1,386* 903* 1,000*  --  1,124*   Cardiac Enzymes: Recent Labs  Lab 05/05/24 0424  CKTOTAL 27*   BNP: Invalid input(s): POCBNP CBG: No results for input(s): GLUCAP in the last 168 hours. HbA1C: No results for input(s): HGBA1C in the last 72 hours. Urine analysis:    Component Value Date/Time   COLORURINE STRAW (A) 04/28/2024 2320   APPEARANCEUR HAZY (A) 04/28/2024 2320   LABSPEC 1.002 (L) 04/28/2024 2320   PHURINE 6.0 04/28/2024 2320   GLUCOSEU NEGATIVE 04/28/2024 2320   HGBUR NEGATIVE 04/28/2024 2320   BILIRUBINUR NEGATIVE 04/28/2024 2320   KETONESUR NEGATIVE 04/28/2024 2320   PROTEINUR NEGATIVE 04/28/2024 2320   NITRITE NEGATIVE 04/28/2024 2320   LEUKOCYTESUR LARGE (A) 04/28/2024 2320   Sepsis Labs: @LABRCNTIP (procalcitonin:4,lacticidven:4) ) Recent Results (from the past 240 hours)  Resp panel by RT-PCR (RSV, Flu A&B, Covid) Anterior Nasal Swab      Status: None   Collection Time: 04/27/24  6:00 PM   Specimen: Anterior Nasal Swab  Result Value Ref Range Status   SARS Coronavirus 2 by RT PCR NEGATIVE NEGATIVE Final    Comment: (NOTE) SARS-CoV-2 target nucleic acids are NOT DETECTED.  The SARS-CoV-2 RNA is generally detectable in upper respiratory specimens during the acute phase of infection. The lowest concentration of SARS-CoV-2 viral copies this assay can detect is 138 copies/mL. A negative result does not preclude SARS-Cov-2 infection and should not be used as the sole basis for treatment or other patient management decisions. A negative result may occur with  improper specimen collection/handling, submission of specimen other than nasopharyngeal swab, presence of viral mutation(s) within the areas targeted by this assay, and inadequate number of viral copies(<138 copies/mL). A negative result must be combined with clinical observations, patient history, and epidemiological information. The expected result is Negative.  Fact Sheet  for Patients:  bloggercourse.com  Fact Sheet for Healthcare Providers:  seriousbroker.it  This test is no t yet approved or cleared by the [place]  FDA and  has been authorized for detection and/or diagnosis of SARS-CoV-2 by FDA under an Emergency Use Authorization (EUA). This EUA will remain  in effect (meaning this test can be used) for the duration of the COVID-19 declaration under Section 564(b)(1) of the Act, 21 U.S.C.section 360bbb-3(b)(1), unless the authorization is terminated  or revoked sooner.       Influenza A by PCR NEGATIVE NEGATIVE Final   Influenza B by PCR NEGATIVE NEGATIVE Final    Comment: (NOTE) The Xpert Xpress SARS-CoV-2/FLU/RSV plus assay is intended as an aid in the diagnosis of influenza from Nasopharyngeal swab specimens and should not be used as a sole basis for treatment. Nasal washings and aspirates are  unacceptable for Xpert Xpress SARS-CoV-2/FLU/RSV testing.  Fact Sheet for Patients: bloggercourse.com  Fact Sheet for Healthcare Providers: seriousbroker.it  This test is not yet approved or cleared by the [place]  FDA and has been authorized for detection and/or diagnosis of SARS-CoV-2 by FDA under an Emergency Use Authorization (EUA). This EUA will remain in effect (meaning this test can be used) for the duration of the COVID-19 declaration under Section 564(b)(1) of the Act, 21 U.S.C. section 360bbb-3(b)(1), unless the authorization is terminated or revoked.     Resp Syncytial Virus by PCR NEGATIVE NEGATIVE Final    Comment: (NOTE) Fact Sheet for Patients: bloggercourse.com  Fact Sheet for Healthcare Providers: seriousbroker.it  This test is not yet approved or cleared by the [place]  FDA and has been authorized for detection and/or diagnosis of SARS-CoV-2 by FDA under an Emergency Use Authorization (EUA). This EUA will remain in effect (meaning this test can be used) for the duration of the COVID-19 declaration under Section 564(b)(1) of the Act, 21 U.S.C. section 360bbb-3(b)(1), unless the authorization is terminated or revoked.  Performed at [facility], [address], [place], [place] [address]   Culture, blood (Routine X 2) w Reflex to ID Panel     Status: None   Collection Time: 05/01/24  4:00 PM   Specimen: BLOOD LEFT HAND  Result Value Ref Range Status   Specimen Description BLOOD LEFT HAND AEROBIC BOTTLE ONLY  Final   Special Requests   Final    Blood Culture results may not be optimal due to an inadequate volume of blood received in culture bottles   Culture   Final    NO GROWTH 5 DAYS Performed at [facility], [address], [place], [place] [address]    Report Status 05/06/2024 FINAL  Final  Culture, blood (Routine X 2) w Reflex to ID Panel      Status: None   Collection Time: 05/01/24  4:00 PM   Specimen: BLOOD LEFT FOREARM  Result Value Ref Range Status   Specimen Description BLOOD LEFT FOREARM AEROBIC BOTTLE ONLY  Final   Special Requests   Final    Blood Culture results may not be optimal due to an inadequate volume of blood received in culture bottles   Culture   Final    NO GROWTH 5 DAYS Performed at [facility], [address], [place], [place] [address]    Report Status 05/06/2024 FINAL  Final  Urine Culture (for pregnant, neutropenic or urologic patients or patients with an indwelling urinary catheter)     Status: None   Collection Time: 05/03/24  3:07 PM   Specimen: Urine,  Clean Catch  Result Value Ref Range Status   Specimen Description   Final    URINE, CLEAN CATCH Performed at [facility], [address], [place], [place] [address]    Special Requests   Final    NONE Performed at [facility], [address] [place], [place] [address]    Culture   Final    NO GROWTH Performed at [facility], [address] [place], [place] [address]    Report Status 05/04/2024 FINAL  Final     Scheduled Meds:  acetaminophen   650 mg Oral Q6H   Or   acetaminophen   650 mg Rectal Q6H   apixaban   5 mg Oral BID   aspirin EC  81 mg Oral Daily   famotidine   20 mg Oral Daily   feeding supplement  237 mL Oral BID BM   folic acid   1 mg Oral Daily   hydroxyurea  500 mg Oral BID   loratadine   5 mg Oral Daily   methotrexate   15 mg Oral Q Tue   polyethylene glycol  17 g Oral Daily   senna  2 tablet Oral Daily   Continuous Infusions:  Procedures/Studies: US  Venous Img Lower Unilateral Right (DVT) Result Date: 05/05/2024 CLINICAL DATA:  Right lower extremity pain. EXAM: RIGHT LOWER EXTREMITY VENOUS DOPPLER ULTRASOUND TECHNIQUE: Gray-scale sonography with graded compression, as well as color Doppler and duplex ultrasound were performed to evaluate the lower extremity deep venous systems from the level of the common  femoral vein and including the common femoral, femoral, profunda femoral, popliteal and calf veins including the posterior tibial, peroneal and gastrocnemius veins when visible. The superficial great saphenous vein was also interrogated. Spectral Doppler was utilized to evaluate flow at rest and with distal augmentation maneuvers in the common femoral, femoral and popliteal veins. COMPARISON:  None Available. FINDINGS: Contralateral Common Femoral Vein: Respiratory phasicity is normal and symmetric with the symptomatic side. No evidence of thrombus. Normal compressibility. Common Femoral Vein: No evidence of thrombus. Normal compressibility, respiratory phasicity and response to augmentation. Saphenofemoral Junction: No evidence of thrombus. Normal compressibility and flow on color Doppler imaging. Profunda Femoral Vein: No evidence of thrombus. Normal compressibility and flow on color Doppler imaging. Femoral Vein: No evidence of thrombus. Normal compressibility, respiratory phasicity and response to augmentation. Popliteal Vein: No evidence of thrombus. Normal compressibility, respiratory phasicity and response to augmentation. Calf Veins: No evidence of thrombus. Normal compressibility and flow on color Doppler imaging. Superficial Great Saphenous Vein: No evidence of thrombus. Normal compressibility. Venous Reflux:  None. Other Findings: No evidence of superficial thrombophlebitis or abnormal fluid collection. IMPRESSION: No evidence of right lower extremity deep venous thrombosis. Electronically Signed   By: Marcey Moan M.D.   On: 05/05/2024 08:18   IR BONE MARROW BIOPSY & ASPIRATION Result Date: 05/03/2024 INDICATION: Leukocytosis EXAM: FLUOROSCOPIC GUIDED RIGHT ILIAC BONE MARROW ASPIRATION AND CORE BIOPSY Date:  05/03/2024 05/03/2024 10:30 am Radiologist:  M. Frederic Specking, MD Guidance:  Fluoroscopy FLUOROSCOPY: Fluoroscopy Time: 1 minutes 0 seconds (3 mGy). MEDICATIONS: 1% lidocaine local  ANESTHESIA/SEDATION: 1.5 mg IV Versed; 50 mcg IV Fentanyl  Moderate Sedation Time:  10 minute The patient was continuously monitored during the procedure by the interventional radiology nurse under my direct supervision. CONTRAST:  None. COMPLICATIONS: None PROCEDURE: Informed consent was obtained from the patient following explanation of the procedure, risks, benefits and alternatives. The patient understands, agrees and consents for the procedure. All questions were addressed. A time out was performed. The  patient was positioned prone and fluoroscopic localization was performed of the pelvis to demonstrate the iliac marrow spaces. Maximal barrier sterile technique utilized including caps, mask, sterile gowns, sterile gloves, large sterile drape, hand hygiene, and Betadine prep. Under sterile conditions and local anesthesia, an 11 gauge coaxial bone biopsy needle was advanced into the right iliac marrow space. Needle position was confirmed with fluoroscopic imaging. Initially, bone marrow aspiration was performed. Next, the 11 gauge outer cannula was utilized to obtain a right iliac bone marrow core biopsy. Needle was removed. Hemostasis was obtained with compression. The patient tolerated the procedure well. Samples were prepared with the cytotechnologist. No immediate complications. IMPRESSION: Fluoroscopically guided right iliac bone marrow aspiration and core biopsy. Electronically Signed   By: CHRISTELLA.  Shick M.D.   On: 05/03/2024 15:09   DG CHEST PORT 1 VIEW Result Date: 05/01/2024 EXAM: 1 VIEW(S) XRAY OF THE CHEST 05/01/2024 04:09:00 PM COMPARISON: 04/28/2024 CLINICAL HISTORY: Fever; per chart: medical history of COPD, Rheumatoid arthritis, diagnosis of PE, aortic thrombosis s/p thrombectomy and suspicion for lymphoproliferative disorder. FINDINGS: LUNGS AND PLEURA: 1 cm right lower lung nodule. No pulmonary edema. No pleural effusion. No pneumothorax. HEART AND MEDIASTINUM: Descending aorta stent noted. No  acute abnormality of the cardiac and mediastinal silhouettes. BONES AND SOFT TISSUES: No acute osseous abnormality. IMPRESSION: 1. No acute findings. 2. 1 cm right lower lung nodule is similar to prior, likely corresponding to areas of tree-in-bud nodularity on prior CT. Electronically signed by: Donnice Mania MD 05/01/2024 08:41 PM EDT RP Workstation: [id number]   US  Venous Img Lower  Left (DVT Study) Result Date: 04/28/2024 CLINICAL DATA:  L leg swellingand pain EXAM: LEFT LOWER EXTREMITY VENOUS DOPPLER ULTRASOUND TECHNIQUE: Gray-scale sonography with graded compression, as well as color Doppler and duplex ultrasound were performed to evaluate the lower extremity deep venous systems from the level of the common femoral vein and including the common femoral, femoral, profunda femoral, popliteal and calf veins including the posterior tibial, peroneal and gastrocnemius veins when visible. The superficial great saphenous vein was also interrogated. Spectral Doppler was utilized to evaluate flow at rest and with distal augmentation maneuvers in the common femoral, femoral and popliteal veins. COMPARISON:  09/30/2023 FINDINGS: Contralateral Common Femoral Vein: Respiratory phasicity is normal and symmetric with the symptomatic side. No evidence of thrombus. Normal compressibility. Common Femoral Vein: No evidence of thrombus. Normal compressibility, respiratory phasicity and response to augmentation. Saphenofemoral Junction: No evidence of thrombus. Normal compressibility and flow on color Doppler imaging. Profunda Femoral Vein: No evidence of thrombus. Normal compressibility and flow on color Doppler imaging. Femoral Vein: No evidence of thrombus. Normal compressibility, respiratory phasicity and response to augmentation. Popliteal Vein: No evidence of thrombus. Normal compressibility, respiratory phasicity and response to augmentation. Calf Veins: No evidence of thrombus. Normal compressibility and flow on color  Doppler imaging. Superficial Great Saphenous Vein: No evidence of thrombus. Normal compressibility. Other Findings:  None. IMPRESSION: Negative for deep venous thrombosis in the left leg. Electronically Signed   By: Rogelia Myers M.D.   On: 04/28/2024 17:59   DG Knee 2 Views Left Result Date: 04/28/2024 CLINICAL DATA:  Left knee pain and swelling. EXAM: LEFT KNEE - 1-2 VIEW COMPARISON:  None Available. FINDINGS: No evidence of fracture or dislocation. There may be a small joint effusion. No evidence of arthropathy or other focal bone abnormality. Mild soft tissue edema. IMPRESSION: Mild soft tissue edema and possible small joint effusion. No acute osseous abnormality. Electronically Signed   By: Andrea  Sanford M.D.   On: 04/28/2024 17:20   DG Chest Portable 1 View Result Date: 04/28/2024 EXAM: 1 VIEW(S) XRAY OF THE CHEST 04/28/2024 03:49:47 PM COMPARISON: 02/24/2024 CLINICAL HISTORY: body pain, leukocytosis. pt states that she was here last night for the same thing, states that she is back for all over body aches and L leg swelling. FINDINGS: LUNGS AND PLEURA: No focal pulmonary opacity. No pulmonary edema. No pleural effusion. No pneumothorax. HEART AND MEDIASTINUM: New aortic stent in place. BONES AND SOFT TISSUES: No acute osseous abnormality. IMPRESSION: 1. No acute cardiopulmonary process. 2. New aortic stent in place. Electronically signed by: Waddell Calk MD 04/28/2024 04:16 PM EDT RP Workstation: [id number]   DG C-Arm 1-60 Min Result Date: 04/12/2024 CLINICAL DATA:  Pedunculated thrombus involving the descending thoracic aorta. EXAM: DG C-ARM 1-60 MIN CONTRAST:  See operative report. FLUOROSCOPY: Radiation Exposure Index (as provided by the fluoroscopic device): 83.984 mGy Kerma COMPARISON:  Chest CTA 03/31/2024 FINDINGS: Right femoral sheath with a pigtail catheter. Angiographic images were obtained at the aortic arch, distal descending thoracic aorta and distal abdominal aorta. A thoracic  stent graft was placed in the descending thoracic aorta. Pigtail run demonstrates patency of the aortic arch, arch great vessels and the descending thoracic aortic stent graft. DSA images demonstrate patency of bilateral renal arteries, celiac trunk and SMA. Bilateral common, internal and external iliac arteries are patent. IMPRESSION: Fluoroscopic and DSA images obtained during placement of a stent graft in the descending thoracic aorta. Electronically Signed   By: Juliene Balder M.D.   On: 04/12/2024 17:44   HYBRID OR IMAGING ([facility] ONLY) Result Date: 04/12/2024 There is no interpretation for this exam.  This order is for images obtained during a surgical procedure.  Please See Surgeries Tab for more information regarding the procedure.    Alm Schneider, DO  [facility]  If 7PM-7AM, please contact night-coverage [email or web address] Password [email or web address] 05/06/2024, 5:26 PM   LOS: 4 days

## 2024-05-07 DIAGNOSIS — R262 Difficulty in walking, not elsewhere classified: Secondary | ICD-10-CM | POA: Diagnosis not present

## 2024-05-07 DIAGNOSIS — D471 Chronic myeloproliferative disease: Secondary | ICD-10-CM | POA: Diagnosis not present

## 2024-05-07 DIAGNOSIS — R531 Weakness: Secondary | ICD-10-CM | POA: Diagnosis not present

## 2024-05-07 DIAGNOSIS — D75839 Thrombocytosis, unspecified: Secondary | ICD-10-CM | POA: Diagnosis not present

## 2024-05-07 LAB — CBC WITH DIFFERENTIAL/PLATELET
Abs Immature Granulocytes: 7.3 K/uL — ABNORMAL HIGH (ref 0.00–0.07)
Band Neutrophils: 3 %
Basophils Absolute: 0 K/uL (ref 0.0–0.1)
Basophils Relative: 0 %
Blasts: 8 %
Eosinophils Absolute: 0 K/uL (ref 0.0–0.5)
Eosinophils Relative: 0 %
HCT: 26.8 % — ABNORMAL LOW (ref 36.0–46.0)
Hemoglobin: 8.4 g/dL — ABNORMAL LOW (ref 12.0–15.0)
Lymphocytes Relative: 10 %
Lymphs Abs: 4.9 K/uL — ABNORMAL HIGH (ref 0.7–4.0)
MCH: 29.9 pg (ref 26.0–34.0)
MCHC: 31.3 g/dL (ref 30.0–36.0)
MCV: 95.4 fL (ref 80.0–100.0)
Metamyelocytes Relative: 3 %
Monocytes Absolute: 1 K/uL (ref 0.1–1.0)
Monocytes Relative: 2 %
Myelocytes: 7 %
Neutro Abs: 31.6 K/uL — ABNORMAL HIGH (ref 1.7–7.7)
Neutrophils Relative %: 62 %
Platelets: 1618 K/uL (ref 150–400)
Promyelocytes Relative: 5 %
RBC: 2.81 MIL/uL — ABNORMAL LOW (ref 3.87–5.11)
RDW: 21.6 % — ABNORMAL HIGH (ref 11.5–15.5)
WBC: 48.6 K/uL — ABNORMAL HIGH (ref 4.0–10.5)
nRBC: 0.1 % (ref 0.0–0.2)

## 2024-05-07 LAB — BASIC METABOLIC PANEL WITH GFR
Anion gap: 12 (ref 5–15)
BUN: 14 mg/dL (ref 8–23)
CO2: 25 mmol/L (ref 22–32)
Calcium: 8.9 mg/dL (ref 8.9–10.3)
Chloride: 101 mmol/L (ref 98–111)
Creatinine, Ser: 1.11 mg/dL — ABNORMAL HIGH (ref 0.44–1.00)
GFR, Estimated: 51 mL/min — ABNORMAL LOW (ref >60)
Glucose, Bld: 84 mg/dL (ref 70–99)
Potassium: 4.3 mmol/L (ref 3.5–5.1)
Sodium: 138 mmol/L (ref 135–145)

## 2024-05-07 MED ORDER — LACTATED RINGERS IV SOLN: INTRAVENOUS | Status: AC

## 2024-05-07 NOTE — Plan of Care (Signed)

## 2024-05-07 NOTE — Plan of Care (Signed)
   Problem: Activity: Goal: Risk for activity intolerance will decrease Outcome: Progressing   Problem: Coping: Goal: Level of anxiety will decrease Outcome: Progressing

## 2024-05-07 NOTE — Plan of Care (Signed)

## 2024-05-07 NOTE — Progress Notes (Signed)
 PROGRESS NOTE  Laurie Wallace FMW:[id number] DOB: Dec 02, 1945 DOA: 04/28/2024 PCP: [name], MD  Brief History:   78 y.o. 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   Assessment/Plan: Leukocytosis and thrombocytosis -Concern for CML versus CMML -WBC peaked at 123K  and platelets up to 1386, discussed with Dr. Davonna with heme/onc and consulting  -Pt says she has missed appts and has not established care with outpatient oncologist - 05/03/24--IR bone marrow bx - discussed with Dr. Davonna and pt started on hydroxyurea 500 mg BID on 05/01/24 -05/03/24 discussed with Dr. Delma d/c until we get some prelim results from bone marrow bx although does not look like acute leukemic conversion right now based on peripheral blood work - 05/03/24 CBC--2% blasts -05/04/24 WBC--64.9, no blasts -05/05/24 WBC--47.7 - 05/06/24 WBC 38.5, 8 % blasts   Macrocytic anemia -- B12 (1469), folate (>20) --10/31 transfused one unit PRBC   Presumed cancer related pain -- scheduled acetaminophen  around the clock    Fever - suspect neoplastic fever - follow and treat supportively - blood cultures neg to date - PCT 0.23>>0.48 - UA 21-50 WBC - follow urine culture--neg -10/27 CXR--no consolidation - PCT 0.23>>0.48 - overall improved     Generalized weakness and deconditioning -TSH 1.430 -PT eval>>SNF -B12 1469 -folate >20 -CK--27     History of pulmonary embolism -09/29/23 CTA chest--acute bilateral lower lobe segmental and subsegmental pulmonary emboli -Continue apixaban  5 mg BID  -history of recent aortic thrombosis status post TEVAR by Dr. Magda on 04/12/2024    COPD No wheezing Bronchodilators PRN    Rheumatoid arthritis Continue with methotrexate  15 mg weekly (Tuesdays)               Family Communication:   Niece at bedside 11/2   Consultants:  med onc    Code Status:  FULL    DVT Prophylaxis:  apixaban      Procedures: As Listed in Progress Note Above   Antibiotics: None          Subjective: Patient denies fevers, chills, headache, chest pain, dyspnea, nausea, vomiting, diarrhea, abdominal pain, dysuria, hematuria, hematochezia, and melena.   Objective: Vitals:   05/06/24 1416 05/06/24 1951 05/07/24 0328 05/07/24 1406  BP: (!) 111/52 (!) 114/45 (!) 112/52 (!) 123/46  Pulse: 85 (!) 101 97 95  Resp: 17 15 16 18   Temp: 98.3 F (36.8 C) 98.7 F (37.1 C) 98.2 F (36.8 C) 98.9 F (37.2 C)  TempSrc: Oral Oral Oral Oral  SpO2: 100% 97% 96% 97%  Weight:      Height:        Intake/Output Summary (Last 24 hours) at 05/07/2024 1456 Last data filed at 05/07/2024 0900 Gross per 24 hour  Intake 960 ml  Output --  Net 960 ml   Weight change:  Exam:  General:  Pt is alert, follows commands appropriately, not in acute  distress HEENT: No icterus, No thrush, No neck mass, [place]/AT Cardiovascular: RRR, S1/S2, no rubs, no gallops Respiratory: CTA bilaterally, no wheezing, no crackles, no rhonchi Abdomen: Soft/+BS, non tender, non distended, no guarding Extremities: No edema, No lymphangitis, No petechiae, No rashes, no synovitis;  no hip pain to palpation.  No hip pain with internal and ext rotation   Data Reviewed: I have personally reviewed following labs and imaging  studies Basic Metabolic Panel: Recent Labs  Lab 05/03/24 0424 05/04/24 0411 05/05/24 0424 05/06/24 0334 05/07/24 0445  NA 137 138 140 139 138  K 3.9 5.0 4.6 4.8 4.3  CL 100 100 104 103 101  CO2 25 26 25 26 25   GLUCOSE 78 77 69* 78 84  BUN 11 16 12 12 14   CREATININE 1.22* 1.16* 1.07* 1.20* 1.11*  CALCIUM 8.7* 8.9 8.7* 8.7* 8.9  MG 2.3  --  2.5* 2.3  --    Liver Function Tests: Recent Labs  Lab 05/03/24 0424  AST 53*  ALT 20  ALKPHOS 183*  BILITOT 0.7  PROT 6.1*  ALBUMIN 3.4*   No results for input(s): LIPASE, AMYLASE in the last 168 hours. No results for input(s): AMMONIA in the last 168 hours. Coagulation Profile: No results for input(s): INR, PROTIME in the last 168 hours. CBC: Recent Labs  Lab 05/02/24 0421 05/03/24 0424 05/04/24 0411 05/05/24 0424 05/05/24 1850 05/06/24 0334 05/07/24 0445  WBC 123.3* 119.1* 64.9* 47.7*  --  38.5* 48.6*  NEUTROABS 71.5* 57.2* 51.9*  --   --  27.3* 31.6*  HGB 8.4* 7.6* 7.3* 7.0* 8.0* 8.1* 8.4*  HCT 26.8* 23.5* 23.2* 22.7* 24.2* 24.6* 26.8*  MCV 100.8* 100.4* 100.4* 100.4*  --  91.1 95.4  PLT 1,250* 1,386* 903* 1,000*  --  1,124* 1,618*   Cardiac Enzymes: Recent Labs  Lab 05/05/24 0424  CKTOTAL 27*   BNP: Invalid input(s): POCBNP CBG: No results for input(s): GLUCAP in the last 168 hours. HbA1C: No results for input(s): HGBA1C in the last 72 hours. Urine analysis:    Component Value Date/Time   COLORURINE STRAW (A) 04/28/2024 2320   APPEARANCEUR HAZY (A) 04/28/2024 2320   LABSPEC 1.002 (L) 04/28/2024 2320   PHURINE 6.0 04/28/2024 2320   GLUCOSEU NEGATIVE 04/28/2024 2320   HGBUR NEGATIVE 04/28/2024 2320   BILIRUBINUR NEGATIVE 04/28/2024 2320   KETONESUR NEGATIVE 04/28/2024 2320   PROTEINUR NEGATIVE 04/28/2024 2320   NITRITE NEGATIVE 04/28/2024 2320   LEUKOCYTESUR LARGE (A) 04/28/2024 2320   Sepsis Labs: @LABRCNTIP (procalcitonin:4,lacticidven:4) ) Recent Results (from the past 240 hours)   Resp panel by RT-PCR (RSV, Flu A&B, Covid) Anterior Nasal Swab     Status: None   Collection Time: 04/27/24  6:00 PM   Specimen: Anterior Nasal Swab  Result Value Ref Range Status   SARS Coronavirus 2 by RT PCR NEGATIVE NEGATIVE Final    Comment: (NOTE) SARS-CoV-2 target nucleic acids are NOT DETECTED.  The SARS-CoV-2 RNA is generally detectable in upper respiratory specimens during the acute phase of infection. The lowest concentration of SARS-CoV-2 viral copies this assay can detect is 138 copies/mL. A negative result does not preclude SARS-Cov-2 infection and should not be used as the sole basis for treatment or other patient management decisions. A negative result may occur with  improper specimen collection/handling, submission of specimen other than nasopharyngeal swab, presence of viral mutation(s) within the areas targeted by this assay, and inadequate number of viral copies(<138 copies/mL). A negative result must be combined with clinical observations, patient  history, and epidemiological information. The expected result is Negative.  Fact Sheet for Patients:  bloggercourse.com  Fact Sheet for Healthcare Providers:  seriousbroker.it  This test is no t yet approved or cleared by the [place]  FDA and  has been authorized for detection and/or diagnosis of SARS-CoV-2 by FDA under an Emergency Use Authorization (EUA). This EUA will remain  in effect (meaning this test can be used) for the duration of the COVID-19 declaration under Section 564(b)(1) of the Act, 21 U.S.C.section 360bbb-3(b)(1), unless the authorization is terminated  or revoked sooner.       Influenza A by PCR NEGATIVE NEGATIVE Final   Influenza B by PCR NEGATIVE NEGATIVE Final    Comment: (NOTE) The Xpert Xpress SARS-CoV-2/FLU/RSV plus assay is intended as an aid in the diagnosis of influenza from Nasopharyngeal swab specimens and should not be used  as a sole basis for treatment. Nasal washings and aspirates are unacceptable for Xpert Xpress SARS-CoV-2/FLU/RSV testing.  Fact Sheet for Patients: bloggercourse.com  Fact Sheet for Healthcare Providers: seriousbroker.it  This test is not yet approved or cleared by the [place]  FDA and has been authorized for detection and/or diagnosis of SARS-CoV-2 by FDA under an Emergency Use Authorization (EUA). This EUA will remain in effect (meaning this test can be used) for the duration of the COVID-19 declaration under Section 564(b)(1) of the Act, 21 U.S.C. section 360bbb-3(b)(1), unless the authorization is terminated or revoked.     Resp Syncytial Virus by PCR NEGATIVE NEGATIVE Final    Comment: (NOTE) Fact Sheet for Patients: bloggercourse.com  Fact Sheet for Healthcare Providers: seriousbroker.it  This test is not yet approved or cleared by the [place]  FDA and has been authorized for detection and/or diagnosis of SARS-CoV-2 by FDA under an Emergency Use Authorization (EUA). This EUA will remain in effect (meaning this test can be used) for the duration of the COVID-19 declaration under Section 564(b)(1) of the Act, 21 U.S.C. section 360bbb-3(b)(1), unless the authorization is terminated or revoked.  Performed at [facility], [address], [place], [place] [address]   Culture, blood (Routine X 2) w Reflex to ID Panel     Status: None   Collection Time: 05/01/24  4:00 PM   Specimen: BLOOD LEFT HAND  Result Value Ref Range Status   Specimen Description BLOOD LEFT HAND AEROBIC BOTTLE ONLY  Final   Special Requests   Final    Blood Culture results may not be optimal due to an inadequate volume of blood received in culture bottles   Culture   Final    NO GROWTH 5 DAYS Performed at [facility], [address], [place], [place] [address]    Report Status 05/06/2024 FINAL   Final  Culture, blood (Routine X 2) w Reflex to ID Panel     Status: None   Collection Time: 05/01/24  4:00 PM   Specimen: BLOOD LEFT FOREARM  Result Value Ref Range Status   Specimen Description BLOOD LEFT FOREARM AEROBIC BOTTLE ONLY  Final   Special Requests   Final    Blood Culture results may not be optimal due to an inadequate volume of blood received in culture bottles   Culture   Final    NO GROWTH 5 DAYS Performed at [facility], [address], [place], [place] [address]    Report Status 05/06/2024 FINAL  Final  Urine Culture (for pregnant, neutropenic or urologic patients or patients with an indwelling urinary catheter)     Status: None  Collection Time: 05/03/24  3:07 PM   Specimen: Urine, Clean Catch  Result Value Ref Range Status   Specimen Description   Final    URINE, CLEAN CATCH Performed at [facility], [address], [place], [place] [address]    Special Requests   Final    NONE Performed at [facility], [address], [place], [place] [address]    Culture   Final    NO GROWTH Performed at [facility], [address], [place], [place] [address]    Report Status 05/04/2024 FINAL  Final     Scheduled Meds:  acetaminophen   650 mg Oral Q6H   Or   acetaminophen   650 mg Rectal Q6H   apixaban   5 mg Oral BID   aspirin EC  81 mg Oral Daily   famotidine   20 mg Oral Daily   feeding supplement  237 mL Oral BID BM   folic acid   1 mg Oral Daily   hydroxyurea  500 mg Oral BID   loratadine   5 mg Oral Daily   methotrexate   15 mg Oral Q Tue   polyethylene glycol  17 g Oral Daily   senna  2 tablet Oral Daily   Continuous Infusions:  Procedures/Studies: US  Venous Img Lower Unilateral Right (DVT) Result Date: 05/05/2024 CLINICAL DATA:  Right lower extremity pain. EXAM: RIGHT LOWER EXTREMITY VENOUS DOPPLER ULTRASOUND TECHNIQUE: Gray-scale sonography with graded compression, as well as color Doppler and duplex ultrasound were performed to evaluate the lower  extremity deep venous systems from the level of the common femoral vein and including the common femoral, femoral, profunda femoral, popliteal and calf veins including the posterior tibial, peroneal and gastrocnemius veins when visible. The superficial great saphenous vein was also interrogated. Spectral Doppler was utilized to evaluate flow at rest and with distal augmentation maneuvers in the common femoral, femoral and popliteal veins. COMPARISON:  None Available. FINDINGS: Contralateral Common Femoral Vein: Respiratory phasicity is normal and symmetric with the symptomatic side. No evidence of thrombus. Normal compressibility. Common Femoral Vein: No evidence of thrombus. Normal compressibility, respiratory phasicity and response to augmentation. Saphenofemoral Junction: No evidence of thrombus. Normal compressibility and flow on color Doppler imaging. Profunda Femoral Vein: No evidence of thrombus. Normal compressibility and flow on color Doppler imaging. Femoral Vein: No evidence of thrombus. Normal compressibility, respiratory phasicity and response to augmentation. Popliteal Vein: No evidence of thrombus. Normal compressibility, respiratory phasicity and response to augmentation. Calf Veins: No evidence of thrombus. Normal compressibility and flow on color Doppler imaging. Superficial Great Saphenous Vein: No evidence of thrombus. Normal compressibility. Venous Reflux:  None. Other Findings: No evidence of superficial thrombophlebitis or abnormal fluid collection. IMPRESSION: No evidence of right lower extremity deep venous thrombosis. Electronically Signed   By: Marcey Moan M.D.   On: 05/05/2024 08:18   IR BONE MARROW BIOPSY & ASPIRATION Result Date: 05/03/2024 INDICATION: Leukocytosis EXAM: FLUOROSCOPIC GUIDED RIGHT ILIAC BONE MARROW ASPIRATION AND CORE BIOPSY Date:  05/03/2024 05/03/2024 10:30 am Radiologist:  M. Frederic Specking, MD Guidance:  Fluoroscopy FLUOROSCOPY: Fluoroscopy Time: 1 minutes 0  seconds (3 mGy). MEDICATIONS: 1% lidocaine local ANESTHESIA/SEDATION: 1.5 mg IV Versed; 50 mcg IV Fentanyl  Moderate Sedation Time:  10 minute The patient was continuously monitored during the procedure by the interventional radiology nurse under my direct supervision. CONTRAST:  None. COMPLICATIONS: None PROCEDURE: Informed consent was obtained from the patient following explanation of the procedure, risks, benefits and alternatives. The patient understands, agrees and consents for the procedure.  All questions were addressed. A time out was performed. The patient was positioned prone and fluoroscopic localization was performed of the pelvis to demonstrate the iliac marrow spaces. Maximal barrier sterile technique utilized including caps, mask, sterile gowns, sterile gloves, large sterile drape, hand hygiene, and Betadine prep. Under sterile conditions and local anesthesia, an 11 gauge coaxial bone biopsy needle was advanced into the right iliac marrow space. Needle position was confirmed with fluoroscopic imaging. Initially, bone marrow aspiration was performed. Next, the 11 gauge outer cannula was utilized to obtain a right iliac bone marrow core biopsy. Needle was removed. Hemostasis was obtained with compression. The patient tolerated the procedure well. Samples were prepared with the cytotechnologist. No immediate complications. IMPRESSION: Fluoroscopically guided right iliac bone marrow aspiration and core biopsy. Electronically Signed   By: CHRISTELLA.  Shick M.D.   On: 05/03/2024 15:09   DG CHEST PORT 1 VIEW Result Date: 05/01/2024 EXAM: 1 VIEW(S) XRAY OF THE CHEST 05/01/2024 04:09:00 PM COMPARISON: 04/28/2024 CLINICAL HISTORY: Fever; per chart: medical history of COPD, Rheumatoid arthritis, diagnosis of PE, aortic thrombosis s/p thrombectomy and suspicion for lymphoproliferative disorder. FINDINGS: LUNGS AND PLEURA: 1 cm right lower lung nodule. No pulmonary edema. No pleural effusion. No pneumothorax. HEART AND  MEDIASTINUM: Descending aorta stent noted. No acute abnormality of the cardiac and mediastinal silhouettes. BONES AND SOFT TISSUES: No acute osseous abnormality. IMPRESSION: 1. No acute findings. 2. 1 cm right lower lung nodule is similar to prior, likely corresponding to areas of tree-in-bud nodularity on prior CT. Electronically signed by: Donnice Mania MD 05/01/2024 08:41 PM EDT RP Workstation: [id number]   US  Venous Img Lower  Left (DVT Study) Result Date: 04/28/2024 CLINICAL DATA:  L leg swellingand pain EXAM: LEFT LOWER EXTREMITY VENOUS DOPPLER ULTRASOUND TECHNIQUE: Gray-scale sonography with graded compression, as well as color Doppler and duplex ultrasound were performed to evaluate the lower extremity deep venous systems from the level of the common femoral vein and including the common femoral, femoral, profunda femoral, popliteal and calf veins including the posterior tibial, peroneal and gastrocnemius veins when visible. The superficial great saphenous vein was also interrogated. Spectral Doppler was utilized to evaluate flow at rest and with distal augmentation maneuvers in the common femoral, femoral and popliteal veins. COMPARISON:  09/30/2023 FINDINGS: Contralateral Common Femoral Vein: Respiratory phasicity is normal and symmetric with the symptomatic side. No evidence of thrombus. Normal compressibility. Common Femoral Vein: No evidence of thrombus. Normal compressibility, respiratory phasicity and response to augmentation. Saphenofemoral Junction: No evidence of thrombus. Normal compressibility and flow on color Doppler imaging. Profunda Femoral Vein: No evidence of thrombus. Normal compressibility and flow on color Doppler imaging. Femoral Vein: No evidence of thrombus. Normal compressibility, respiratory phasicity and response to augmentation. Popliteal Vein: No evidence of thrombus. Normal compressibility, respiratory phasicity and response to augmentation. Calf Veins: No evidence of  thrombus. Normal compressibility and flow on color Doppler imaging. Superficial Great Saphenous Vein: No evidence of thrombus. Normal compressibility. Other Findings:  None. IMPRESSION: Negative for deep venous thrombosis in the left leg. Electronically Signed   By: Rogelia Myers M.D.   On: 04/28/2024 17:59   DG Knee 2 Views Left Result Date: 04/28/2024 CLINICAL DATA:  Left knee pain and swelling. EXAM: LEFT KNEE - 1-2 VIEW COMPARISON:  None Available. FINDINGS: No evidence of fracture or dislocation. There may be a small joint effusion. No evidence of arthropathy or other focal bone abnormality. Mild soft tissue edema. IMPRESSION: Mild soft tissue edema and possible small joint effusion. No  acute osseous abnormality. Electronically Signed   By: Andrea Gasman M.D.   On: 04/28/2024 17:20   DG Chest Portable 1 View Result Date: 04/28/2024 EXAM: 1 VIEW(S) XRAY OF THE CHEST 04/28/2024 03:49:47 PM COMPARISON: 02/24/2024 CLINICAL HISTORY: body pain, leukocytosis. pt states that she was here last night for the same thing, states that she is back for all over body aches and L leg swelling. FINDINGS: LUNGS AND PLEURA: No focal pulmonary opacity. No pulmonary edema. No pleural effusion. No pneumothorax. HEART AND MEDIASTINUM: New aortic stent in place. BONES AND SOFT TISSUES: No acute osseous abnormality. IMPRESSION: 1. No acute cardiopulmonary process. 2. New aortic stent in place. Electronically signed by: Waddell Calk MD 04/28/2024 04:16 PM EDT RP Workstation: [id number]   DG C-Arm 1-60 Min Result Date: 04/12/2024 CLINICAL DATA:  Pedunculated thrombus involving the descending thoracic aorta. EXAM: DG C-ARM 1-60 MIN CONTRAST:  See operative report. FLUOROSCOPY: Radiation Exposure Index (as provided by the fluoroscopic device): 83.984 mGy Kerma COMPARISON:  Chest CTA 03/31/2024 FINDINGS: Right femoral sheath with a pigtail catheter. Angiographic images were obtained at the aortic arch, distal descending  thoracic aorta and distal abdominal aorta. A thoracic stent graft was placed in the descending thoracic aorta. Pigtail run demonstrates patency of the aortic arch, arch great vessels and the descending thoracic aortic stent graft. DSA images demonstrate patency of bilateral renal arteries, celiac trunk and SMA. Bilateral common, internal and external iliac arteries are patent. IMPRESSION: Fluoroscopic and DSA images obtained during placement of a stent graft in the descending thoracic aorta. Electronically Signed   By: Juliene Balder M.D.   On: 04/12/2024 17:44   HYBRID OR IMAGING ([facility] ONLY) Result Date: 04/12/2024 There is no interpretation for this exam.  This order is for images obtained during a surgical procedure.  Please See Surgeries Tab for more information regarding the procedure.    Alm Schneider, DO  [facility]  If 7PM-7AM, please contact night-coverage [email or web address] Password [email or web address] 05/07/2024, 2:56 PM   LOS: 5 days

## 2024-05-08 DIAGNOSIS — D75839 Thrombocytosis, unspecified: Secondary | ICD-10-CM | POA: Diagnosis not present

## 2024-05-08 DIAGNOSIS — R262 Difficulty in walking, not elsewhere classified: Secondary | ICD-10-CM | POA: Diagnosis not present

## 2024-05-08 DIAGNOSIS — D471 Chronic myeloproliferative disease: Secondary | ICD-10-CM | POA: Diagnosis not present

## 2024-05-08 DIAGNOSIS — R531 Weakness: Secondary | ICD-10-CM | POA: Diagnosis not present

## 2024-05-08 LAB — CBC WITH DIFFERENTIAL/PLATELET
Abs Immature Granulocytes: 4.2 K/uL — ABNORMAL HIGH (ref 0.00–0.07)
Band Neutrophils: 5 %
Basophils Absolute: 0 K/uL (ref 0.0–0.1)
Basophils Relative: 0 %
Blasts: 6 %
Eosinophils Absolute: 1.1 K/uL — ABNORMAL HIGH (ref 0.0–0.5)
Eosinophils Relative: 3 %
HCT: 24.4 % — ABNORMAL LOW (ref 36.0–46.0)
Hemoglobin: 7.9 g/dL — ABNORMAL LOW (ref 12.0–15.0)
Lymphocytes Relative: 8 %
Lymphs Abs: 2.8 K/uL (ref 0.7–4.0)
MCH: 31 pg (ref 26.0–34.0)
MCHC: 32.4 g/dL (ref 30.0–36.0)
MCV: 95.7 fL (ref 80.0–100.0)
Metamyelocytes Relative: 3 %
Monocytes Absolute: 4.6 K/uL — ABNORMAL HIGH (ref 0.1–1.0)
Monocytes Relative: 13 %
Myelocytes: 7 %
Neutro Abs: 20.4 K/uL — ABNORMAL HIGH (ref 1.7–7.7)
Neutrophils Relative %: 53 %
Platelets: 1370 K/uL (ref 150–400)
Promyelocytes Relative: 2 %
RBC: 2.55 MIL/uL — ABNORMAL LOW (ref 3.87–5.11)
RDW: 20.3 % — ABNORMAL HIGH (ref 11.5–15.5)
WBC: 35.1 K/uL — ABNORMAL HIGH (ref 4.0–10.5)
nRBC: 0.2 % (ref 0.0–0.2)

## 2024-05-08 LAB — SURGICAL PATHOLOGY

## 2024-05-08 MED ORDER — LACTATED RINGERS IV SOLN: INTRAVENOUS | Status: AC

## 2024-05-08 NOTE — Progress Notes (Signed)
 Nurse at bedside,patient alert and oriented times four.No c/o pain or discomfort noted. Patient ambulating in room with mobility tech times one assist with front wheel walker. Plan of care on going.

## 2024-05-08 NOTE — Plan of Care (Signed)
   Problem: Education: Goal: Knowledge of General Education information will improve Description Including pain rating scale, medication(s)/side effects and non-pharmacologic comfort measures Outcome: Progressing   Problem: Education: Goal: Knowledge of General Education information will improve Description Including pain rating scale, medication(s)/side effects and non-pharmacologic comfort measures Outcome: Progressing

## 2024-05-08 NOTE — Progress Notes (Addendum)
 Physical Therapy Treatment Patient Details Name: Laurie Wallace MRN: [id number] DOB: 08/15/1945 Today's Date: 05/08/2024   History of Present Illness Laurie Wallace  is a 78 y.o. female, with past medical history of COPD, rheumatoid arthritis, diagnosis of PE earlier in the year, and diagnosis of thrombosis status post endovascular stent placement   04/14/2024, patient undergoing workup for leukocytosis with concern of lymphoproliferative malignancy (CML versus CMML), which she missed most recent 2 appointment earlier this week due to not feeling well, and living by herself with family not aware she has appointment scheduled with oncology  - Presents to ED secondary to generalized weakness, fatigue, left lower extremity pain, denies fever, chills, chest pain or shortness of breath, reports she is compliant with her Eliquis .  - ED her workup significant for significant leukocytosis at 58K, anemia with hemoglobin of 8.3, platelet count at 447K, with absolute granulocyte at 17K, INR at 2.1, venous Dopplers negative for left lower extremity edema, x-ray significant for left knee soft tissue edema with possible small knee effusion, ED discussed with oncology on-call who recommended admission for hydration, PT, OT and supportive care.    PT Comments  Pt. Tolerated session well, and presented with general weakness in LE. Pt was able to perform bed mobility with Min A due initial weakness, Pt was able to transfer from sit to stand and ambulate w/RW and CGA. Pt seemed motivated to ambulate to nursing station. Towards the end of ambulation, pt felt fatigued. Pt was left in bed with sister and niece. Nursing staff was notified on pt status. Patient will benefit from continued skilled physical therapy in hospital and recommended venue below to continue to increase strength, balance, endurance for safe ADLs and gait.    If plan is discharge home, recommend the following: A little help with walking  and/or transfers;Assist for transportation;Assistance with cooking/housework;Help with stairs or ramp for entrance   Can travel by private vehicle     Yes  Equipment Recommendations  None recommended by PT    Recommendations for Other Services       Precautions / Restrictions Precautions Precautions: Fall Recall of Precautions/Restrictions: Intact Restrictions Weight Bearing Restrictions Per Provider Order: No     Mobility  Bed Mobility Overal bed mobility: Needs Assistance Bed Mobility: Sit to Supine, Supine to Sit     Supine to sit: Min assist Sit to supine: Modified independent (Device/Increase time), Independent   General bed mobility comments: Pt. needed Min A to EOB due to slight initial fatigue. slow and labored    Transfers Overall transfer level: Needs assistance Equipment used: Rolling walker (2 wheels) Transfers: Sit to/from Stand Sit to Stand: Contact guard assist           General transfer comment: CGA with sit to stand, used RW for standing stability.    Ambulation/Gait Ambulation/Gait assistance: Contact guard assist Gait Distance (Feet): 67 Feet Assistive device: Rolling walker (2 wheels) Gait Pattern/deviations: Decreased step length - right, Trunk flexed, Decreased step length - left, Step-through pattern       General Gait Details: Pt. was able to ambulate with CGA and RW, pt. seemed motivated to ambulate to nursing station. Pt. has slight fatigue in LE towards the end of treatment.   Stairs             Wheelchair Mobility     Tilt Bed    Modified Rankin (Stroke Patients Only)       Balance Overall balance assessment: Needs assistance Sitting-balance support: Feet  supported, Bilateral upper extremity supported Sitting balance-Leahy Scale: Good Sitting balance - Comments: seated at EOB   Standing balance support: Bilateral upper extremity supported, During functional activity, Reliant on assistive device for  balance Standing balance-Leahy Scale: Good Standing balance comment: Good/fair with RW                            Communication Communication Communication: No apparent difficulties  Cognition Arousal: Alert Behavior During Therapy: WFL for tasks assessed/performed   PT - Cognitive impairments: No apparent impairments                         Following commands: Intact      Cueing Cueing Techniques: Verbal cues, Tactile cues, Visual cues  Exercises General Exercises - Lower Extremity Straight Leg Raises: Seated, AROM, Strengthening, Both, 10 reps Hip Flexion/Marching: AROM, Strengthening, Both, 10 reps, Standing Toe Raises: AROM, Strengthening, Both, 10 reps, Seated Heel Raises: AROM, Strengthening, Both, 10 reps, Seated    General Comments        Pertinent Vitals/Pain Pain Assessment Pain Assessment: No/denies pain    Home Living                          Prior Function            PT Goals (current goals can now be found in the care plan section) Acute Rehab PT Goals Patient Stated Goal: Return home following short rehab stay PT Goal Formulation: With patient Time For Goal Achievement: 05/12/24 Potential to Achieve Goals: Good Progress towards PT goals: Progressing toward goals    Frequency    Min 3X/week      PT Plan      Co-evaluation              AM-PAC PT 6 Clicks Mobility   Outcome Measure  Help needed turning from your back to your side while in a flat bed without using bedrails?: A Little Help needed moving from lying on your back to sitting on the side of a flat bed without using bedrails?: A Little Help needed moving to and from a bed to a chair (including a wheelchair)?: A Little Help needed standing up from a chair using your arms (e.g., wheelchair or bedside chair)?: A Little Help needed to walk in hospital room?: A Little Help needed climbing 3-5 steps with a railing? : A Lot 6 Click Score: 17     End of Session   Activity Tolerance: Patient tolerated treatment well Patient left: in bed;with call bell/phone within reach;with family/visitor present Nurse Communication: Mobility status PT Visit Diagnosis: Muscle weakness (generalized) (M62.81);Other abnormalities of gait and mobility (R26.89);History of falling (Z91.81)     Time: 8557-8488 PT Time Calculation (min) (ACUTE ONLY): 29 min  Charges:    $Gait Training: 8-22 mins $Therapeutic Exercise: 8-22 mins PT General Charges $$ ACUTE PT VISIT: 1 Visit                     Rikita Grabert, SPT

## 2024-05-08 NOTE — Progress Notes (Signed)
 Mobility Specialist Progress Note:    05/08/24 0925  Mobility  Activity Ambulated with assistance  Level of Assistance Minimal assist, patient does 75% or more  Assistive Device Front wheel walker  Distance Ambulated (ft) 40 ft  Range of Motion/Exercises Active;All extremities  Activity Response Tolerated well  Mobility Referral Yes  Mobility visit 1 Mobility  Mobility Specialist Start Time (ACUTE ONLY) [id number]  Mobility Specialist Stop Time (ACUTE ONLY) 0945  Mobility Specialist Time Calculation (min) (ACUTE ONLY) 20 min   Pt received in bed, agreeable to mobility. Required MinA for bed mobility/sit to stand, CGA to ambulate with RW. Tolerated well, asx throughout. Returned supine, family in room. All needs met.  Bluford Sedler Mobility Specialist Please contact via Special Educational Needs Teacher or  Rehab office at [phone]

## 2024-05-08 NOTE — Plan of Care (Signed)
   Problem: Activity: Goal: Risk for activity intolerance will decrease Outcome: Progressing   Problem: Coping: Goal: Level of anxiety will decrease Outcome: Progressing

## 2024-05-08 NOTE — Progress Notes (Signed)
 PROGRESS NOTE  Laurie Wallace FMW:[id number] DOB: 1946-04-29 DOA: 04/28/2024 PCP: [name], MD  Brief History:   78 y.o. 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   Assessment/Plan: Leukocytosis and thrombocytosis -Concern for CML versus CMML -WBC peaked at 123K  and platelets up to 1386, discussed with Dr. Davonna with heme/onc and consulting  -Pt says she has missed appts and has not established care with outpatient oncologist - 05/03/24--IR bone marrow bx - discussed with Dr. Davonna and pt started on hydroxyurea 500 mg BID on 05/01/24 -05/03/24 discussed with Dr. Delma d/c until we get some prelim results from bone marrow bx although does not look like acute leukemic conversion right now based on peripheral blood work - 05/03/24 CBC--2% blasts -05/04/24 WBC--64.9, no blasts -05/05/24 WBC--47.7 - 05/06/24 WBC 38.5, 8 % blasts - 05/08/24 WBC 35.1, 6% blasts - 05/08/24--discussed with pathology, Dr. 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; not currently in acute myeloid leukemic conversion - 05/08/24--also discussed with hematology, Delon Burns>>they will set up follow up in clinic within one week   Macrocytic anemia -- B12 (1469), folate (>20) --10/31 transfused one unit PRBC   Presumed cancer  related pain -- scheduled acetaminophen  around the clock  - overall improving;  ambulating hallways   Fever - suspect neoplastic fever - follow and treat supportively - blood cultures neg to date - PCT 0.23>>0.48 - UA 21-50 WBC - follow urine culture--neg -10/27 CXR--no consolidation - PCT 0.23>>0.48 - overall improved    Generalized weakness and deconditioning -TSH 1.430 -PT eval>>SNF -B12 1469 -folate >20 -CK--27     History of pulmonary embolism -09/29/23 CTA chest--acute bilateral lower lobe segmental and subsegmental pulmonary emboli -Continue apixaban  5 mg BID  -history of recent aortic thrombosis status post TEVAR by Dr. Magda on 04/12/2024    COPD No wheezing Bronchodilators PRN    Rheumatoid arthritis Continue with methotrexate  15 mg weekly (Tuesdays)               Family Communication:   Niece at bedside 11/3   Consultants:  med onc    Code Status:  FULL    DVT Prophylaxis:  apixaban      Procedures: As Listed in Progress Note Above   Antibiotics: None       Subjective: Patient denies fevers, chills, headache, chest pain, dyspnea, nausea, vomiting, diarrhea, abdominal pain, dysuria, hematuria, hematochezia, and melena.   Objective: Vitals:   05/07/24 1926 05/08/24 0546 05/08/24 0919 05/08/24 1459  BP: (!) 107/47 (!) 120/48 (!) 118/48 (!) 107/53  Pulse: 88 86 92 88  Resp: 18 18 18 18   Temp: 98.9 F (37.2 C) 98 F (36.7 C) (!) 97.5 F (36.4 C) 98.1 F (36.7 C)  TempSrc: Oral Oral Oral Oral  SpO2: 98% 99% 99% 99%  Weight:      Height:  Intake/Output Summary (Last 24 hours) at 05/08/2024 1706 Last data filed at 05/08/2024 0206 Gross per 24 hour  Intake 774.22 ml  Output --  Net 774.22 ml   Weight change:  Exam:  General:  Pt is alert, follows commands appropriately, not in acute distress HEENT: No icterus, No thrush, No neck mass, [place]/AT Cardiovascular: RRR, S1/S2, no rubs, no gallops Respiratory: CTA bilaterally,  no wheezing, no crackles, no rhonchi Abdomen: Soft/+BS, non tender, non distended, no guarding Extremities: No edema, No lymphangitis, No petechiae, No rashes, no synovitis   Data Reviewed: I have personally reviewed following labs and imaging studies Basic Metabolic Panel: Recent Labs  Lab 05/03/24 0424 05/04/24 0411 05/05/24 0424 05/06/24 0334 05/07/24 0445  NA 137 138 140 139 138  K 3.9 5.0 4.6 4.8 4.3  CL 100 100 104 103 101  CO2 25 26 25 26 25   GLUCOSE 78 77 69* 78 84  BUN 11 16 12 12 14   CREATININE 1.22* 1.16* 1.07* 1.20* 1.11*  CALCIUM 8.7* 8.9 8.7* 8.7* 8.9  MG 2.3  --  2.5* 2.3  --    Liver Function Tests: Recent Labs  Lab 05/03/24 0424  AST 53*  ALT 20  ALKPHOS 183*  BILITOT 0.7  PROT 6.1*  ALBUMIN 3.4*   No results for input(s): LIPASE, AMYLASE in the last 168 hours. No results for input(s): AMMONIA in the last 168 hours. Coagulation Profile: No results for input(s): INR, PROTIME in the last 168 hours. CBC: Recent Labs  Lab 05/03/24 0424 05/04/24 0411 05/05/24 0424 05/05/24 1850 05/06/24 0334 05/07/24 0445 05/08/24 0431  WBC 119.1* 64.9* 47.7*  --  38.5* 48.6* 35.1*  NEUTROABS 57.2* 51.9*  --   --  27.3* 31.6* 20.4*  HGB 7.6* 7.3* 7.0* 8.0* 8.1* 8.4* 7.9*  HCT 23.5* 23.2* 22.7* 24.2* 24.6* 26.8* 24.4*  MCV 100.4* 100.4* 100.4*  --  91.1 95.4 95.7  PLT 1,386* 903* 1,000*  --  1,124* 1,618* 1,370*   Cardiac Enzymes: Recent Labs  Lab 05/05/24 0424  CKTOTAL 27*   BNP: Invalid input(s): POCBNP CBG: No results for input(s): GLUCAP in the last 168 hours. HbA1C: No results for input(s): HGBA1C in the last 72 hours. Urine analysis:    Component Value Date/Time   COLORURINE STRAW (A) 04/28/2024 2320   APPEARANCEUR HAZY (A) 04/28/2024 2320   LABSPEC 1.002 (L) 04/28/2024 2320   PHURINE 6.0 04/28/2024 2320   GLUCOSEU NEGATIVE 04/28/2024 2320   HGBUR NEGATIVE 04/28/2024 2320   BILIRUBINUR NEGATIVE 04/28/2024 2320   KETONESUR  NEGATIVE 04/28/2024 2320   PROTEINUR NEGATIVE 04/28/2024 2320   NITRITE NEGATIVE 04/28/2024 2320   LEUKOCYTESUR LARGE (A) 04/28/2024 2320   Sepsis Labs: @LABRCNTIP (procalcitonin:4,lacticidven:4) ) Recent Results (from the past 240 hours)  Culture, blood (Routine X 2) w Reflex to ID Panel     Status: None   Collection Time: 05/01/24  4:00 PM   Specimen: BLOOD LEFT HAND  Result Value Ref Range Status   Specimen Description BLOOD LEFT HAND AEROBIC BOTTLE ONLY  Final   Special Requests   Final    Blood Culture results may not be optimal due to an inadequate volume of blood received in culture bottles   Culture   Final    NO GROWTH 5 DAYS Performed at [facility], [address] [place], [place] [address]    Report Status 05/06/2024 FINAL  Final  Culture, blood (Routine X 2) w Reflex to ID Panel     Status: None  Collection Time: 05/01/24  4:00 PM   Specimen: BLOOD LEFT FOREARM  Result Value Ref Range Status   Specimen Description BLOOD LEFT FOREARM AEROBIC BOTTLE ONLY  Final   Special Requests   Final    Blood Culture results may not be optimal due to an inadequate volume of blood received in culture bottles   Culture   Final    NO GROWTH 5 DAYS Performed at [facility], [address], [place], [place] [address]    Report Status 05/06/2024 FINAL  Final  Urine Culture (for pregnant, neutropenic or urologic patients or patients with an indwelling urinary catheter)     Status: None   Collection Time: 05/03/24  3:07 PM   Specimen: Urine, Clean Catch  Result Value Ref Range Status   Specimen Description   Final    URINE, CLEAN CATCH Performed at [facility], [address], [place], [place] [address]    Special Requests   Final    NONE Performed at [facility], [address] [place], [place] [address]    Culture   Final    NO GROWTH Performed at [facility], [address] [place], [place] [address]    Report Status 05/04/2024 FINAL  Final     Scheduled Meds:   acetaminophen   650 mg Oral Q6H   Or   acetaminophen   650 mg Rectal Q6H   apixaban   5 mg Oral BID   aspirin EC  81 mg Oral Daily   famotidine   20 mg Oral Daily   feeding supplement  237 mL Oral BID BM   folic acid   1 mg Oral Daily   hydroxyurea  500 mg Oral BID   loratadine   5 mg Oral Daily   methotrexate   15 mg Oral Q Tue   polyethylene glycol  17 g Oral Daily   senna  2 tablet Oral Daily   Continuous Infusions:  lactated ringers       Procedures/Studies: US  Venous Img Lower Unilateral Right (DVT) Result Date: 05/05/2024 CLINICAL DATA:  Right lower extremity pain. EXAM: RIGHT LOWER EXTREMITY VENOUS DOPPLER ULTRASOUND TECHNIQUE: Gray-scale sonography with graded compression, as well as color Doppler and duplex ultrasound were performed to evaluate the lower extremity deep venous systems from the level of the common femoral vein and including the common femoral, femoral, profunda femoral, popliteal and calf veins including the posterior tibial, peroneal and gastrocnemius veins when visible. The superficial great saphenous vein was also interrogated. Spectral Doppler was utilized to evaluate flow at rest and with distal augmentation maneuvers in the common femoral, femoral and popliteal veins. COMPARISON:  None Available. FINDINGS: Contralateral Common Femoral Vein: Respiratory phasicity is normal and symmetric with the symptomatic side. No evidence of thrombus. Normal compressibility. Common Femoral Vein: No evidence of thrombus. Normal compressibility, respiratory phasicity and response to augmentation. Saphenofemoral Junction: No evidence of thrombus. Normal compressibility and flow on color Doppler imaging. Profunda Femoral Vein: No evidence of thrombus. Normal compressibility and flow on color Doppler imaging. Femoral Vein: No evidence of thrombus. Normal compressibility, respiratory phasicity and response to augmentation. Popliteal Vein: No evidence of thrombus. Normal compressibility,  respiratory phasicity and response to augmentation. Calf Veins: No evidence of thrombus. Normal compressibility and flow on color Doppler imaging. Superficial Great Saphenous Vein: No evidence of thrombus. Normal compressibility. Venous Reflux:  None. Other Findings: No evidence of superficial thrombophlebitis or abnormal fluid collection. IMPRESSION: No evidence of right lower extremity deep venous thrombosis. Electronically Signed   By: Marcey  Luverne M.D.   On: 05/05/2024 08:18   IR BONE MARROW BIOPSY & ASPIRATION Result Date: 05/03/2024 INDICATION: Leukocytosis EXAM: FLUOROSCOPIC GUIDED RIGHT ILIAC BONE MARROW ASPIRATION AND CORE BIOPSY Date:  05/03/2024 05/03/2024 10:30 am Radiologist:  M. Frederic Specking, MD Guidance:  Fluoroscopy FLUOROSCOPY: Fluoroscopy Time: 1 minutes 0 seconds (3 mGy). MEDICATIONS: 1% lidocaine local ANESTHESIA/SEDATION: 1.5 mg IV Versed; 50 mcg IV Fentanyl  Moderate Sedation Time:  10 minute The patient was continuously monitored during the procedure by the interventional radiology nurse under my direct supervision. CONTRAST:  None. COMPLICATIONS: None PROCEDURE: Informed consent was obtained from the patient following explanation of the procedure, risks, benefits and alternatives. The patient understands, agrees and consents for the procedure. All questions were addressed. A time out was performed. The patient was positioned prone and fluoroscopic localization was performed of the pelvis to demonstrate the iliac marrow spaces. Maximal barrier sterile technique utilized including caps, mask, sterile gowns, sterile gloves, large sterile drape, hand hygiene, and Betadine prep. Under sterile conditions and local anesthesia, an 11 gauge coaxial bone biopsy needle was advanced into the right iliac marrow space. Needle position was confirmed with fluoroscopic imaging. Initially, bone marrow aspiration was performed. Next, the 11 gauge outer cannula was utilized to obtain a right iliac bone  marrow core biopsy. Needle was removed. Hemostasis was obtained with compression. The patient tolerated the procedure well. Samples were prepared with the cytotechnologist. No immediate complications. IMPRESSION: Fluoroscopically guided right iliac bone marrow aspiration and core biopsy. Electronically Signed   By: CHRISTELLA.  Shick M.D.   On: 05/03/2024 15:09   DG CHEST PORT 1 VIEW Result Date: 05/01/2024 EXAM: 1 VIEW(S) XRAY OF THE CHEST 05/01/2024 04:09:00 PM COMPARISON: 04/28/2024 CLINICAL HISTORY: Fever; per chart: medical history of COPD, Rheumatoid arthritis, diagnosis of PE, aortic thrombosis s/p thrombectomy and suspicion for lymphoproliferative disorder. FINDINGS: LUNGS AND PLEURA: 1 cm right lower lung nodule. No pulmonary edema. No pleural effusion. No pneumothorax. HEART AND MEDIASTINUM: Descending aorta stent noted. No acute abnormality of the cardiac and mediastinal silhouettes. BONES AND SOFT TISSUES: No acute osseous abnormality. IMPRESSION: 1. No acute findings. 2. 1 cm right lower lung nodule is similar to prior, likely corresponding to areas of tree-in-bud nodularity on prior CT. Electronically signed by: Donnice Mania MD 05/01/2024 08:41 PM EDT RP Workstation: [id number]   US  Venous Img Lower  Left (DVT Study) Result Date: 04/28/2024 CLINICAL DATA:  L leg swellingand pain EXAM: LEFT LOWER EXTREMITY VENOUS DOPPLER ULTRASOUND TECHNIQUE: Gray-scale sonography with graded compression, as well as color Doppler and duplex ultrasound were performed to evaluate the lower extremity deep venous systems from the level of the common femoral vein and including the common femoral, femoral, profunda femoral, popliteal and calf veins including the posterior tibial, peroneal and gastrocnemius veins when visible. The superficial great saphenous vein was also interrogated. Spectral Doppler was utilized to evaluate flow at rest and with distal augmentation maneuvers in the common femoral, femoral and popliteal  veins. COMPARISON:  09/30/2023 FINDINGS: Contralateral Common Femoral Vein: Respiratory phasicity is normal and symmetric with the symptomatic side. No evidence of thrombus. Normal compressibility. Common Femoral Vein: No evidence of thrombus. Normal compressibility, respiratory phasicity and response to augmentation. Saphenofemoral Junction: No evidence of thrombus. Normal compressibility and flow on color Doppler imaging. Profunda Femoral Vein: No evidence of thrombus. Normal compressibility and flow on color Doppler imaging. Femoral Vein: No evidence of thrombus. Normal compressibility, respiratory phasicity and response to augmentation. Popliteal Vein: No evidence of thrombus. Normal compressibility, respiratory phasicity  and response to augmentation. Calf Veins: No evidence of thrombus. Normal compressibility and flow on color Doppler imaging. Superficial Great Saphenous Vein: No evidence of thrombus. Normal compressibility. Other Findings:  None. IMPRESSION: Negative for deep venous thrombosis in the left leg. Electronically Signed   By: Rogelia Myers M.D.   On: 04/28/2024 17:59   DG Knee 2 Views Left Result Date: 04/28/2024 CLINICAL DATA:  Left knee pain and swelling. EXAM: LEFT KNEE - 1-2 VIEW COMPARISON:  None Available. FINDINGS: No evidence of fracture or dislocation. There may be a small joint effusion. No evidence of arthropathy or other focal bone abnormality. Mild soft tissue edema. IMPRESSION: Mild soft tissue edema and possible small joint effusion. No acute osseous abnormality. Electronically Signed   By: Andrea Gasman M.D.   On: 04/28/2024 17:20   DG Chest Portable 1 View Result Date: 04/28/2024 EXAM: 1 VIEW(S) XRAY OF THE CHEST 04/28/2024 03:49:47 PM COMPARISON: 02/24/2024 CLINICAL HISTORY: body pain, leukocytosis. pt states that she was here last night for the same thing, states that she is back for all over body aches and L leg swelling. FINDINGS: LUNGS AND PLEURA: No focal  pulmonary opacity. No pulmonary edema. No pleural effusion. No pneumothorax. HEART AND MEDIASTINUM: New aortic stent in place. BONES AND SOFT TISSUES: No acute osseous abnormality. IMPRESSION: 1. No acute cardiopulmonary process. 2. New aortic stent in place. Electronically signed by: Waddell Calk MD 04/28/2024 04:16 PM EDT RP Workstation: [id number]   DG C-Arm 1-60 Min Result Date: 04/12/2024 CLINICAL DATA:  Pedunculated thrombus involving the descending thoracic aorta. EXAM: DG C-ARM 1-60 MIN CONTRAST:  See operative report. FLUOROSCOPY: Radiation Exposure Index (as provided by the fluoroscopic device): 83.984 mGy Kerma COMPARISON:  Chest CTA 03/31/2024 FINDINGS: Right femoral sheath with a pigtail catheter. Angiographic images were obtained at the aortic arch, distal descending thoracic aorta and distal abdominal aorta. A thoracic stent graft was placed in the descending thoracic aorta. Pigtail run demonstrates patency of the aortic arch, arch great vessels and the descending thoracic aortic stent graft. DSA images demonstrate patency of bilateral renal arteries, celiac trunk and SMA. Bilateral common, internal and external iliac arteries are patent. IMPRESSION: Fluoroscopic and DSA images obtained during placement of a stent graft in the descending thoracic aorta. Electronically Signed   By: Juliene Balder M.D.   On: 04/12/2024 17:44   HYBRID OR IMAGING ([facility] ONLY) Result Date: 04/12/2024 There is no interpretation for this exam.  This order is for images obtained during a surgical procedure.  Please See Surgeries Tab for more information regarding the procedure.    Alm Schneider, DO  [facility]  If 7PM-7AM, please contact night-coverage [email or web address] Password [email or web address] 05/08/2024, 5:06 PM   LOS: 6 days

## 2024-05-09 ENCOUNTER — Ambulatory Visit: Admitting: Internal Medicine

## 2024-05-09 DIAGNOSIS — D75839 Thrombocytosis, unspecified: Secondary | ICD-10-CM | POA: Diagnosis not present

## 2024-05-09 DIAGNOSIS — R262 Difficulty in walking, not elsewhere classified: Secondary | ICD-10-CM | POA: Diagnosis not present

## 2024-05-09 DIAGNOSIS — D471 Chronic myeloproliferative disease: Secondary | ICD-10-CM | POA: Diagnosis not present

## 2024-05-09 DIAGNOSIS — R531 Weakness: Secondary | ICD-10-CM | POA: Diagnosis not present

## 2024-05-09 LAB — BASIC METABOLIC PANEL WITH GFR
Anion gap: 9 (ref 5–15)
BUN: 11 mg/dL (ref 8–23)
CO2: 26 mmol/L (ref 22–32)
Calcium: 8.9 mg/dL (ref 8.9–10.3)
Chloride: 107 mmol/L (ref 98–111)
Creatinine, Ser: 1.04 mg/dL — ABNORMAL HIGH (ref 0.44–1.00)
GFR, Estimated: 55 mL/min — ABNORMAL LOW (ref >60)
Glucose, Bld: 74 mg/dL (ref 70–99)
Potassium: 4.3 mmol/L (ref 3.5–5.1)
Sodium: 142 mmol/L (ref 135–145)

## 2024-05-09 LAB — CBC WITH DIFFERENTIAL/PLATELET
Abs Immature Granulocytes: 2.9 K/uL — ABNORMAL HIGH (ref 0.00–0.07)
Band Neutrophils: 5 %
Basophils Absolute: 0 K/uL (ref 0.0–0.1)
Basophils Relative: 0 %
Blasts: 4 %
Eosinophils Absolute: 1.6 K/uL — ABNORMAL HIGH (ref 0.0–0.5)
Eosinophils Relative: 6 %
HCT: 24.4 % — ABNORMAL LOW (ref 36.0–46.0)
Hemoglobin: 7.7 g/dL — ABNORMAL LOW (ref 12.0–15.0)
Lymphocytes Relative: 12 %
Lymphs Abs: 3.1 K/uL (ref 0.7–4.0)
MCH: 30.1 pg (ref 26.0–34.0)
MCHC: 31.6 g/dL (ref 30.0–36.0)
MCV: 95.3 fL (ref 80.0–100.0)
Metamyelocytes Relative: 6 %
Monocytes Absolute: 4.2 K/uL — ABNORMAL HIGH (ref 0.1–1.0)
Monocytes Relative: 16 %
Myelocytes: 5 %
Neutro Abs: 13.4 K/uL — ABNORMAL HIGH (ref 1.7–7.7)
Neutrophils Relative %: 46 %
Platelets: 1213 K/uL (ref 150–400)
RBC: 2.56 MIL/uL — ABNORMAL LOW (ref 3.87–5.11)
RDW: 20.2 % — ABNORMAL HIGH (ref 11.5–15.5)
WBC: 26.2 K/uL — ABNORMAL HIGH (ref 4.0–10.5)
nRBC: 0.2 % (ref 0.0–0.2)

## 2024-05-09 LAB — MAGNESIUM: Magnesium: 2.3 mg/dL (ref 1.7–2.4)

## 2024-05-09 MED ORDER — ALLOPURINOL 100 MG PO TABS: 100.0000 mg | ORAL_TABLET | Freq: Every day | ORAL | 1 refills | Status: DC

## 2024-05-09 MED ORDER — HYDROXYUREA 500 MG PO CAPS: 500.0000 mg | ORAL_CAPSULE | Freq: Two times a day (BID) | ORAL | 1 refills | Status: DC

## 2024-05-09 NOTE — Discharge Summary (Addendum)
 Physician Discharge Summary   Patient: Laurie Wallace MRN: [id number] DOB: [birth year]  Admit date:     04/28/2024  Discharge date: 05/09/24  Discharge Physician: Alm Izayah Miner   PCP: [name], MD   Recommendations at discharge:   Please follow up with primary care provider within 1-2 weeks  Please repeat BMP and CBC in one week      Hospital Course:  78 y.o. 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  Assessment and Plan:  Leukocytosis and thrombocytosis -Concern for CML versus CMML -WBC peaked at 123K  and platelets up to 1386, discussed with Dr. Davonna with heme/onc and consulting  -Pt says she has missed appts and has not established care with outpatient oncologist - 05/03/24--IR bone marrow bx - discussed with Dr. Davonna and pt started on hydroxyurea 500 mg BID on 05/01/24 -05/03/24 discussed with Dr. Delma d/c until we get some prelim results from bone marrow bx although does not look like acute leukemic conversion right now based on peripheral blood work - 05/03/24 CBC--2% blasts -05/04/24 WBC--64.9, no blasts -05/05/24 WBC--47.7 - 05/06/24 WBC 38.5, 8 % blasts - 05/08/24 WBC 35.1, 6% blasts - 05/08/24--discussed with pathology, Dr. 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; not currently in acute myeloid  leukemic conversion - 05/08/24--also discussed with hematology, Delon Burns>>they will set up follow up in clinic within one week -05/09/24 WBC 26.2, 4% blasts   Macrocytic anemia -- B12 (1469), folate (>20) --10/31 transfused one unit PRBC   Presumed cancer related pain -- scheduled acetaminophen  around the clock  - overall improving;  ambulating hallways   Fever - suspect neoplastic fever - follow and treat supportively - blood cultures neg to date - PCT 0.23>>0.48 - UA 21-50 WBC - follow urine culture--neg -10/27 CXR--no consolidation - PCT 0.23>>0.48 - overall improved    Generalized weakness and deconditioning -TSH 1.430 -PT eval>>SNF -B12 1469 -folate >20 -CK--27 -05/09/24--had peer-to-peer review with her insurance>>denied SNF;  setting up HHPT    History of pulmonary embolism -09/29/23 CTA chest--acute bilateral lower lobe segmental and subsegmental pulmonary emboli -Continue apixaban  5 mg BID  -history of recent aortic thrombosis status post TEVAR by Dr. Magda on 04/12/2024    COPD No wheezing Bronchodilators PRN    Rheumatoid arthritis Continue with methotrexate  15 mg weekly (Tuesdays)     Consultants: none Procedures performed: none  Disposition: Home Diet recommendation:  regular DISCHARGE MEDICATION: Allergies as of 05/09/2024   No Known Allergies      Medication List     TAKE these medications    allopurinol 100 MG tablet Commonly known as: ZYLOPRIM Take 1 tablet (100 mg total) by mouth daily.   aspirin EC 81 MG tablet Take 81 mg by mouth daily. Swallow whole.   Eliquis  5 MG Tabs tablet Generic drug: apixaban  Take 1 tablet (5 mg total) by mouth 2 (two) times daily.   famotidine  40 MG tablet Commonly known  as: PEPCID  TAKE ONE TABLET BY MOUTH DAILY   folic acid  1 MG tablet Commonly known as: FOLVITE  Take 1 tablet (1 mg total) by mouth daily.   hydroxyurea 500 MG capsule Commonly known as: HYDREA Take 1 capsule (500 mg total)  by mouth 2 (two) times daily. May take with food to minimize GI side effects.   levocetirizine 5 MG tablet Commonly known as: XYZAL  Take 1 tablet (5 mg total) by mouth every evening.   methotrexate  2.5 MG tablet Commonly known as: RHEUMATREX Take 6 tablets (15 mg total) by mouth once a week. Caution:Chemotherapy. Protect from light. What changed: when to take this        Contact information for after-discharge care     Destination     [facility] .   Service: Skilled Nursing Contact information: [address] [place]  [address] [phone]                    Discharge Exam: Fredricka Weights   04/28/24 1522 04/28/24 2100  Weight: 54.4 kg 51.8 kg   HEENT:  [place]/AT, No thrush, no icterus CV:  RRR, no rub, no S3, no S4 Lung:  CTA, no wheeze, no rhonchi Abd:  soft/+BS, NT Ext:  No edema, no lymphangitis, no synovitis, no rash   Condition at discharge: stable  The results of significant diagnostics from this hospitalization (including imaging, microbiology, ancillary and laboratory) are listed below for reference.   Imaging Studies: US  Venous Img Lower Unilateral Right (DVT) Result Date: 05/05/2024 CLINICAL DATA:  Right lower extremity pain. EXAM: RIGHT LOWER EXTREMITY VENOUS DOPPLER ULTRASOUND TECHNIQUE: Gray-scale sonography with graded compression, as well as color Doppler and duplex ultrasound were performed to evaluate the lower extremity deep venous systems from the level of the common femoral vein and including the common femoral, femoral, profunda femoral, popliteal and calf veins including the posterior tibial, peroneal and gastrocnemius veins when visible. The superficial great saphenous vein was also interrogated. Spectral Doppler was utilized to evaluate flow at rest and with distal augmentation maneuvers in the common femoral, femoral and popliteal veins. COMPARISON:  None Available. FINDINGS: Contralateral  Common Femoral Vein: Respiratory phasicity is normal and symmetric with the symptomatic side. No evidence of thrombus. Normal compressibility. Common Femoral Vein: No evidence of thrombus. Normal compressibility, respiratory phasicity and response to augmentation. Saphenofemoral Junction: No evidence of thrombus. Normal compressibility and flow on color Doppler imaging. Profunda Femoral Vein: No evidence of thrombus. Normal compressibility and flow on color Doppler imaging. Femoral Vein: No evidence of thrombus. Normal compressibility, respiratory phasicity and response to augmentation. Popliteal Vein: No evidence of thrombus. Normal compressibility, respiratory phasicity and response to augmentation. Calf Veins: No evidence of thrombus. Normal compressibility and flow on color Doppler imaging. Superficial Great Saphenous Vein: No evidence of thrombus. Normal compressibility. Venous Reflux:  None. Other Findings: No evidence of superficial thrombophlebitis or abnormal fluid collection. IMPRESSION: No evidence of right lower extremity deep venous thrombosis. Electronically Signed   By: Marcey Moan M.D.   On: 05/05/2024 08:18   IR BONE MARROW BIOPSY & ASPIRATION Result Date: 05/03/2024 INDICATION: Leukocytosis EXAM: FLUOROSCOPIC GUIDED RIGHT ILIAC BONE MARROW ASPIRATION AND CORE BIOPSY Date:  05/03/2024 05/03/2024 10:30 am Radiologist:  M. Frederic Specking, MD Guidance:  Fluoroscopy FLUOROSCOPY: Fluoroscopy Time: 1 minutes 0 seconds (3 mGy). MEDICATIONS: 1% lidocaine local ANESTHESIA/SEDATION: 1.5 mg IV Versed; 50 mcg IV Fentanyl  Moderate Sedation Time:  10 minute The patient was continuously monitored during  the procedure by the interventional radiology nurse under my direct supervision. CONTRAST:  None. COMPLICATIONS: None PROCEDURE: Informed consent was obtained from the patient following explanation of the procedure, risks, benefits and alternatives. The patient understands, agrees and consents for the  procedure. All questions were addressed. A time out was performed. The patient was positioned prone and fluoroscopic localization was performed of the pelvis to demonstrate the iliac marrow spaces. Maximal barrier sterile technique utilized including caps, mask, sterile gowns, sterile gloves, large sterile drape, hand hygiene, and Betadine prep. Under sterile conditions and local anesthesia, an 11 gauge coaxial bone biopsy needle was advanced into the right iliac marrow space. Needle position was confirmed with fluoroscopic imaging. Initially, bone marrow aspiration was performed. Next, the 11 gauge outer cannula was utilized to obtain a right iliac bone marrow core biopsy. Needle was removed. Hemostasis was obtained with compression. The patient tolerated the procedure well. Samples were prepared with the cytotechnologist. No immediate complications. IMPRESSION: Fluoroscopically guided right iliac bone marrow aspiration and core biopsy. Electronically Signed   By: CHRISTELLA.  Shick M.D.   On: 05/03/2024 15:09   DG CHEST PORT 1 VIEW Result Date: 05/01/2024 EXAM: 1 VIEW(S) XRAY OF THE CHEST 05/01/2024 04:09:00 PM COMPARISON: 04/28/2024 CLINICAL HISTORY: Fever; per chart: medical history of COPD, Rheumatoid arthritis, diagnosis of PE, aortic thrombosis s/p thrombectomy and suspicion for lymphoproliferative disorder. FINDINGS: LUNGS AND PLEURA: 1 cm right lower lung nodule. No pulmonary edema. No pleural effusion. No pneumothorax. HEART AND MEDIASTINUM: Descending aorta stent noted. No acute abnormality of the cardiac and mediastinal silhouettes. BONES AND SOFT TISSUES: No acute osseous abnormality. IMPRESSION: 1. No acute findings. 2. 1 cm right lower lung nodule is similar to prior, likely corresponding to areas of tree-in-bud nodularity on prior CT. Electronically signed by: Donnice Mania MD 05/01/2024 08:41 PM EDT RP Workstation: [id number]   US  Venous Img Lower  Left (DVT Study) Result Date: 04/28/2024 CLINICAL  DATA:  L leg swellingand pain EXAM: LEFT LOWER EXTREMITY VENOUS DOPPLER ULTRASOUND TECHNIQUE: Gray-scale sonography with graded compression, as well as color Doppler and duplex ultrasound were performed to evaluate the lower extremity deep venous systems from the level of the common femoral vein and including the common femoral, femoral, profunda femoral, popliteal and calf veins including the posterior tibial, peroneal and gastrocnemius veins when visible. The superficial great saphenous vein was also interrogated. Spectral Doppler was utilized to evaluate flow at rest and with distal augmentation maneuvers in the common femoral, femoral and popliteal veins. COMPARISON:  09/30/2023 FINDINGS: Contralateral Common Femoral Vein: Respiratory phasicity is normal and symmetric with the symptomatic side. No evidence of thrombus. Normal compressibility. Common Femoral Vein: No evidence of thrombus. Normal compressibility, respiratory phasicity and response to augmentation. Saphenofemoral Junction: No evidence of thrombus. Normal compressibility and flow on color Doppler imaging. Profunda Femoral Vein: No evidence of thrombus. Normal compressibility and flow on color Doppler imaging. Femoral Vein: No evidence of thrombus. Normal compressibility, respiratory phasicity and response to augmentation. Popliteal Vein: No evidence of thrombus. Normal compressibility, respiratory phasicity and response to augmentation. Calf Veins: No evidence of thrombus. Normal compressibility and flow on color Doppler imaging. Superficial Great Saphenous Vein: No evidence of thrombus. Normal compressibility. Other Findings:  None. IMPRESSION: Negative for deep venous thrombosis in the left leg. Electronically Signed   By: Rogelia Myers M.D.   On: 04/28/2024 17:59   DG Knee 2 Views Left Result Date: 04/28/2024 CLINICAL DATA:  Left knee pain and swelling. EXAM: LEFT KNEE - 1-2 VIEW  COMPARISON:  None Available. FINDINGS: No evidence of  fracture or dislocation. There may be a small joint effusion. No evidence of arthropathy or other focal bone abnormality. Mild soft tissue edema. IMPRESSION: Mild soft tissue edema and possible small joint effusion. No acute osseous abnormality. Electronically Signed   By: Andrea Gasman M.D.   On: 04/28/2024 17:20   DG Chest Portable 1 View Result Date: 04/28/2024 EXAM: 1 VIEW(S) XRAY OF THE CHEST 04/28/2024 03:49:47 PM COMPARISON: 02/24/2024 CLINICAL HISTORY: body pain, leukocytosis. pt states that she was here last night for the same thing, states that she is back for all over body aches and L leg swelling. FINDINGS: LUNGS AND PLEURA: No focal pulmonary opacity. No pulmonary edema. No pleural effusion. No pneumothorax. HEART AND MEDIASTINUM: New aortic stent in place. BONES AND SOFT TISSUES: No acute osseous abnormality. IMPRESSION: 1. No acute cardiopulmonary process. 2. New aortic stent in place. Electronically signed by: Waddell Calk MD 04/28/2024 04:16 PM EDT RP Workstation: [id number]   DG C-Arm 1-60 Min Result Date: 04/12/2024 CLINICAL DATA:  Pedunculated thrombus involving the descending thoracic aorta. EXAM: DG C-ARM 1-60 MIN CONTRAST:  See operative report. FLUOROSCOPY: Radiation Exposure Index (as provided by the fluoroscopic device): 83.984 mGy Kerma COMPARISON:  Chest CTA 03/31/2024 FINDINGS: Right femoral sheath with a pigtail catheter. Angiographic images were obtained at the aortic arch, distal descending thoracic aorta and distal abdominal aorta. A thoracic stent graft was placed in the descending thoracic aorta. Pigtail run demonstrates patency of the aortic arch, arch great vessels and the descending thoracic aortic stent graft. DSA images demonstrate patency of bilateral renal arteries, celiac trunk and SMA. Bilateral common, internal and external iliac arteries are patent. IMPRESSION: Fluoroscopic and DSA images obtained during placement of a stent graft in the descending thoracic  aorta. Electronically Signed   By: Juliene Balder M.D.   On: 04/12/2024 17:44   HYBRID OR IMAGING ([facility] ONLY) Result Date: 04/12/2024 There is no interpretation for this exam.  This order is for images obtained during a surgical procedure.  Please See Surgeries Tab for more information regarding the procedure.    Microbiology: Results for orders placed or performed during the hospital encounter of 04/28/24  Culture, blood (Routine X 2) w Reflex to ID Panel     Status: None   Collection Time: 05/01/24  4:00 PM   Specimen: BLOOD LEFT HAND  Result Value Ref Range Status   Specimen Description BLOOD LEFT HAND AEROBIC BOTTLE ONLY  Final   Special Requests   Final    Blood Culture results may not be optimal due to an inadequate volume of blood received in culture bottles   Culture   Final    NO GROWTH 5 DAYS Performed at [facility], [address], [place], [place] [address]    Report Status 05/06/2024 FINAL  Final  Culture, blood (Routine X 2) w Reflex to ID Panel     Status: None   Collection Time: 05/01/24  4:00 PM   Specimen: BLOOD LEFT FOREARM  Result Value Ref Range Status   Specimen Description BLOOD LEFT FOREARM AEROBIC BOTTLE ONLY  Final   Special Requests   Final    Blood Culture results may not be optimal due to an inadequate volume of blood received in culture bottles   Culture   Final    NO GROWTH 5 DAYS Performed at [facility], [address], [place], [place] [address]    Report Status 05/06/2024 FINAL  Final  Urine  Culture (for pregnant, neutropenic or urologic patients or patients with an indwelling urinary catheter)     Status: None   Collection Time: 05/03/24  3:07 PM   Specimen: Urine, Clean Catch  Result Value Ref Range Status   Specimen Description   Final    URINE, CLEAN CATCH Performed at [facility], [address] [place], [place] [address]    Special Requests   Final    NONE Performed at [facility], [address] [place], [place] [address]    Culture    Final    NO GROWTH Performed at [facility], [address] [place], [place] [address]    Report Status 05/04/2024 FINAL  Final    Labs: CBC: Recent Labs  Lab 05/04/24 0411 05/05/24 0424 05/05/24 1850 05/06/24 0334 05/07/24 0445 05/08/24 0431 05/09/24 0420  WBC 64.9* 47.7*  --  38.5* 48.6* 35.1* 26.2*  NEUTROABS 51.9*  --   --  27.3* 31.6* 20.4* 13.4*  HGB 7.3* 7.0* 8.0* 8.1* 8.4* 7.9* 7.7*  HCT 23.2* 22.7* 24.2* 24.6* 26.8* 24.4* 24.4*  MCV 100.4* 100.4*  --  91.1 95.4 95.7 95.3  PLT 903* 1,000*  --  1,124* 1,618* 1,370* 1,213*   Basic Metabolic Panel: Recent Labs  Lab 05/03/24 0424 05/04/24 0411 05/05/24 0424 05/06/24 0334 05/07/24 0445 05/09/24 0420  NA 137 138 140 139 138 142  K 3.9 5.0 4.6 4.8 4.3 4.3  CL 100 100 104 103 101 107  CO2 25 26 25 26 25 26   GLUCOSE 78 77 69* 78 84 74  BUN 11 16 12 12 14 11   CREATININE 1.22* 1.16* 1.07* 1.20* 1.11* 1.04*  CALCIUM 8.7* 8.9 8.7* 8.7* 8.9 8.9  MG 2.3  --  2.5* 2.3  --  2.3   Liver Function Tests: Recent Labs  Lab 05/03/24 0424  AST 53*  ALT 20  ALKPHOS 183*  BILITOT 0.7  PROT 6.1*  ALBUMIN 3.4*   CBG: No results for input(s): GLUCAP in the last 168 hours.  Discharge time spent: greater than 30 minutes.  Signed: Alm Schneider, MD [facility] 05/09/2024

## 2024-05-09 NOTE — Care Management Important Message (Signed)
 Important Message  Patient Details  Name: Laurie Wallace MRN: [id number] Date of Birth: 1945/10/24   Important Message Given:  Yes - Medicare IM     [name] 05/09/2024, 10:30 AM

## 2024-05-09 NOTE — Progress Notes (Signed)
 Physical Therapy Treatment Patient Details Name: Laurie Wallace MRN: [id number] DOB: 1945/08/18 Today's Date: 05/09/2024   History of Present Illness Laurie Wallace  is a 78 y.o. female, with past medical history of COPD, rheumatoid arthritis, diagnosis of PE earlier in the year, and diagnosis of thrombosis status post endovascular stent placement   04/14/2024, patient undergoing workup for leukocytosis with concern of lymphoproliferative malignancy (CML versus CMML), which she missed most recent 2 appointment earlier this week due to not feeling well, and living by herself with family not aware she has appointment scheduled with oncology  - Presents to ED secondary to generalized weakness, fatigue, left lower extremity pain, denies fever, chills, chest pain or shortness of breath, reports she is compliant with her Eliquis .  - ED her workup significant for significant leukocytosis at 58K, anemia with hemoglobin of 8.3, platelet count at 447K, with absolute granulocyte at 17K, INR at 2.1, venous Dopplers negative for left lower extremity edema, x-ray significant for left knee soft tissue edema with possible small knee effusion, ED discussed with oncology on-call who recommended admission for hydration, PT, OT and supportive care.    PT Comments  Patient agreeable to PT treatment. Pt was received supine in bed, reporting she had an accident in bed. Patient is mod independent with rolling while PT performs pericare. Min assist needed for supine to sit, as pt avoids using R hand due to IV. CGA/supervision during transfers, and ambulation with RW. Pt demonstrates improved endurance with longer ambulation distance and no reports of pain throughout. Reports she will have assistance once home initially. Reports she does not have a RW or SPC at home. Pt would benefit from AD for maximum safety. SW aware of recommendation. Order placed. Patient tolerates sitting in chair at end of session, with sister  present. Patient will benefit from continued skilled physical therapy acutely and in recommended venue in order to address deficits to return to PLOF and improve function.    If plan is discharge home, recommend the following: A little help with walking and/or transfers;Assist for transportation;Assistance with cooking/housework;Help with stairs or ramp for entrance   Can travel by private vehicle        Equipment Recommendations  Rolling walker (2 wheels)    Recommendations for Other Services       Precautions / Restrictions Precautions Precautions: Fall Recall of Precautions/Restrictions: Intact Restrictions Weight Bearing Restrictions Per Provider Order: No     Mobility  Bed Mobility Overal bed mobility: Needs Assistance Bed Mobility: Supine to Sit, Rolling Rolling: Modified independent (Device/Increase time)   Supine to sit: Min assist     General bed mobility comments: mod independent using railings to roll onto R side as pt needed to be cleaned at beginning of session. Required min assist for trunk elevation this date during supine>sit, pt demo slow labored movement    Transfers Overall transfer level: Needs assistance Equipment used: Rolling walker (2 wheels) Transfers: Sit to/from Stand Sit to Stand: Contact guard assist           General transfer comment: CGA fro safety during STS from bed and toilet with RW    Ambulation/Gait Ambulation/Gait assistance: Contact guard assist Gait Distance (Feet): 70 Feet Assistive device: Rolling walker (2 wheels) Gait Pattern/deviations: Decreased step length - right, Trunk flexed, Decreased step length - left, Step-through pattern Gait velocity: Slightly dec     General Gait Details: Pt ambulates into halls with RW and CGA for safety, does not exhibit any overt LOB  or unsteadiness with RW   Stairs             Wheelchair Mobility     Tilt Bed    Modified Rankin (Stroke Patients Only)       Balance  Overall balance assessment: Needs assistance Sitting-balance support: Feet supported, Bilateral upper extremity supported Sitting balance-Leahy Scale: Good Sitting balance - Comments: seated at EOB   Standing balance support: Bilateral upper extremity supported, During functional activity Standing balance-Leahy Scale: Good Standing balance comment: Good/fair with RW            Communication Communication Communication: No apparent difficulties  Cognition Arousal: Alert Behavior During Therapy: WFL for tasks assessed/performed   PT - Cognitive impairments: No apparent impairments         Following commands: Intact      Cueing Cueing Techniques: Verbal cues, Tactile cues, Visual cues  Exercises      General Comments        Pertinent Vitals/Pain Pain Assessment Pain Assessment: No/denies pain    Home Living                          Prior Function            PT Goals (current goals can now be found in the care plan section) Acute Rehab PT Goals Patient Stated Goal: Return home following short rehab stay PT Goal Formulation: With patient Time For Goal Achievement: 05/12/24 Potential to Achieve Goals: Good Progress towards PT goals: Progressing toward goals    Frequency    Min 3X/week      PT Plan      Co-evaluation              AM-PAC PT 6 Clicks Mobility   Outcome Measure  Help needed turning from your back to your side while in a flat bed without using bedrails?: A Little Help needed moving from lying on your back to sitting on the side of a flat bed without using bedrails?: A Little Help needed moving to and from a bed to a chair (including a wheelchair)?: A Little Help needed standing up from a chair using your arms (e.g., wheelchair or bedside chair)?: A Little Help needed to walk in hospital room?: A Little Help needed climbing 3-5 steps with a railing? : A Little 6 Click Score: 18    End of Session   Activity  Tolerance: Patient tolerated treatment well Patient left: in chair;with family/visitor present   PT Visit Diagnosis: Muscle weakness (generalized) (M62.81);Other abnormalities of gait and mobility (R26.89);History of falling (Z91.81) Pain - part of body: Knee;Leg     Time: 8991-8964 PT Time Calculation (min) (ACUTE ONLY): 27 min  Charges:    $Therapeutic Activity: 23-37 mins PT General Charges $$ ACUTE PT VISIT: 1 Visit                     12:48 PM, 05/09/24 Marquice Uddin Powell-Butler, PT, DPT [place] [facility]

## 2024-05-09 NOTE — TOC Transition Note (Signed)
 Transition of Care [facility]) - Discharge Note   Patient Details  Name: Laurie Wallace MRN: [id number] Date of Birth: 1945/11/17  Transition of Care [facility]) CM/SW Contact:  Sharlyne Stabs, RN Phone Number: 05/09/2024, 12:23 PM   Clinical Narrative:   Peer to Peer completed, Insurance denied SNF. Family will take patient home with Home health PT/OT. Referral sent in the hub. [facility] accepted. Orders placed.    Final next level of care: Home w Home Health Services Barriers to Discharge: Barriers Resolved   Patient Goals and CMS Choice Patient states their goals for this hospitalization and ongoing recovery are:: short-term rehab CMS Medicare.gov Compare Post Acute Care list provided to:: Patient Represenative (must comment) (daughter) Choice offered to / list presented to : Adult Children     Discharge Placement      Name of family member notified: Niece Patient and family notified of of transfer: 05/09/24  Discharge Plan and Services Additional resources added to the After Visit Summary for   In-house Referral: Clinical Social Work   Post Acute Care Choice: Durable Medical Equipment               [facility] Agency: [facility] Date [facility] Agency Contacted: 05/09/24 Time HH Agency Contacted: 1153 Representative spoke with at [facility] Agency: HUB  Social Drivers of Health (SDOH) Interventions SDOH Screenings   Food Insecurity: No Food Insecurity (04/28/2024)  Housing: Low Risk  (04/28/2024)  Transportation Needs: No Transportation Needs (04/28/2024)  Utilities: Not At Risk (04/28/2024)  Alcohol Screen: Low Risk  (06/14/2023)  Depression (PHQ2-9): Medium Risk (11/08/2023)  Financial Resource Strain: Low Risk  (06/14/2023)  Physical Activity: Sufficiently Active (06/14/2023)  Social Connections: Moderately Isolated (04/28/2024)  Stress: No Stress Concern Present (06/14/2023)  Tobacco Use: Low Risk  (04/28/2024)  Health Literacy: Adequate Health Literacy (06/14/2023)     Readmission Risk Interventions    05/07/2024    1:30 PM 01/11/2023    2:07 PM  Readmission Risk Prevention Plan  Transportation Screening Complete Complete  PCP or Specialist Appt within 5-7 Days  Complete  Home Care Screening Complete Complete  Medication Review (RN CM) Complete Complete

## 2024-05-09 NOTE — Progress Notes (Signed)
 Nurse at bedside,patient alert and oriented to person,place,and time,some what confused to situation.Patient very pleasant,no c/o pain or discomfort noted this morning. Family at the bedside. Plan of care on going.

## 2024-05-10 ENCOUNTER — Inpatient Hospital Stay: Payer: Self-pay

## 2024-05-10 ENCOUNTER — Telehealth: Payer: Self-pay

## 2024-05-10 LAB — VON WILLEBRAND FACTOR MULTIMER

## 2024-05-10 NOTE — Telephone Encounter (Signed)
 Spoke lisa to approve orders.

## 2024-05-10 NOTE — Telephone Encounter (Signed)
 Copied from CRM [id number]. Topic: Clinical - Home Health Verbal Orders >> May 10, 2024  2:50 PM Emylou G wrote: Caller/Agency: [facility] Callback Number: [phone] - ask for lisa Service Requested: Physical Therapy and Occ therapy Frequency: wants to push back start date to the 11/10 Any new concerns about the patient? No

## 2024-05-10 NOTE — Transitions of Care (Post Inpatient/ED Visit) (Signed)
   05/10/2024  Name: Laurie Wallace MRN: [id number] DOB: 1945-11-28  Today's TOC FU Call Status: Today's TOC FU Call Status:: Unsuccessful Call (1st Attempt) Unsuccessful Call (1st Attempt) Date: 05/10/24  Attempted to reach the patient regarding the most recent Inpatient/ED visit.  Follow Up Plan: Additional outreach attempts will be made to reach the patient to complete the Transitions of Care (Post Inpatient/ED visit) call.   Arvin Seip RN, BSN, CCM [employer], Population Health Case Manager Phone: [phone]

## 2024-05-10 NOTE — Telephone Encounter (Signed)
 Copied from CRM [id number]. Topic: General - Other >> May 10, 2024  3:21 PM Montie POUR wrote: Reason for CRM:  Laurie Wallace needs a FL2 completed for Laurie Wallace so she can get home health nurse aide at home as soon as possible. She needs 24/7 care. Notes from hospital is in her chart.  Social Services is making her primary doctor complete this form. Please call Laurie Wallace when this is completed at [phone] and she will pick it up.

## 2024-05-11 ENCOUNTER — Encounter (HOSPITAL_COMMUNITY): Payer: Self-pay

## 2024-05-11 ENCOUNTER — Ambulatory Visit (HOSPITAL_COMMUNITY)
Admission: RE | Admit: 2024-05-11 | Discharge: 2024-05-11 | Disposition: A | Discharge status: Home / Self Care | Source: Ambulatory Visit | Attending: Vascular Surgery | Admitting: Vascular Surgery

## 2024-05-11 ENCOUNTER — Telehealth: Payer: Self-pay

## 2024-05-11 DIAGNOSIS — I7410 Embolism and thrombosis of unspecified parts of aorta: Secondary | ICD-10-CM | POA: Diagnosis present

## 2024-05-11 MED ORDER — IOHEXOL 350 MG/ML SOLN
100.0000 mL | Freq: Once | INTRAVENOUS | Status: AC | PRN
  Administered 2024-05-11: 100 mL via INTRAVENOUS

## 2024-05-11 NOTE — Transitions of Care (Post Inpatient/ED Visit) (Signed)
 05/11/2024  Name: Laurie Wallace MRN: [id number] DOB: 10/28/1945  Today's TOC FU Call Status: Today's TOC FU Call Status:: Successful TOC FU Call Completed TOC FU Call Complete Date: 05/11/24 Patient's Name and Date of Birth confirmed.  Transition Care Management Follow-up Telephone Call Date of Discharge: 05/09/24 [name] Type of Discharge: Inpatient Admission Primary Inpatient Discharge Diagnosis:: Weakness; possible oncology related How have you been since you were released from the hospital?: Same Any questions or concerns?: No  Items Reviewed: Did you receive and understand the discharge instructions provided?: Yes (My niece has the paper work) Medications obtained,verified, and reconciled?: Yes (Medications Reviewed) Any new allergies since your discharge?: No Dietary orders reviewed?: Yes Type of Diet Ordered:: Heart Healthy low sodium Do you have support at home?: Yes People in Home [RPT]: alone Name of Support/Comfort Primary Source: [name], son and my niece [name]  Medications Reviewed Today: Medications Reviewed Today     Reviewed by [name], RN (Registered Nurse) on 05/11/24 at 1611  Med List Status: <None>   Medication Order Taking? Sig Documenting Provider Last Dose Status Informant  allopurinol (ZYLOPRIM) 100 MG tablet [id number] Yes Take 1 tablet (100 mg total) by mouth daily. Evonnie Lenis, MD  Active   aspirin EC 81 MG tablet [id number] Yes Take 81 mg by mouth daily. Swallow whole. [provider]  Active Self, Pharmacy Records    Discontinued 07/23/20 1124   ELIQUIS  5 MG TABS tablet [id number] Yes Take 1 tablet (5 mg total) by mouth 2 (two) times daily. Rogers Hai, MD  Active Self, Pharmacy Records  famotidine  (PEPCID ) 40 MG tablet [id number]  TAKE ONE TABLET BY MOUTH DAILY  Patient not taking: Reported on 04/28/2024   [name], MD  Active Self, Pharmacy Records           Med Note [facility],  CHUCK MATSU   Fri Apr 28, 2024  6:57 PM) Pt stated she does not think she takes this but is unsure  folic acid  (FOLVITE ) 1 MG tablet [id number] Yes Take 1 tablet (1 mg total) by mouth daily. Jeannetta Lonni ORN, MD  Active Self, Pharmacy Records  hydroxyurea [facility]) 500 MG capsule [id number] Yes Take 1 capsule (500 mg total) by mouth 2 (two) times daily. May take with food to minimize GI side effects. Evonnie Lenis, MD  Active   levocetirizine (XYZAL ) 5 MG tablet [id number] Yes Take 1 tablet (5 mg total) by mouth every evening. [name], MD  Active Self, Pharmacy Records  methotrexate  [facility]) 2.5 MG tablet [id number]  Take 6 tablets (15 mg total) by mouth once a week. Caution:Chemotherapy. Protect from light.  Patient taking differently: Take 15 mg by mouth every Tuesday. Caution:Chemotherapy. Protect from light.   Rice, Lonni ORN, MD  Active Self, Pharmacy Records           Med Note [place], CHUCK MATSU Schaumann Apr 27, 2024  7:26 PM)              Home Care and Equipment/Supplies: Were Home Health Services Ordered?: Yes Name of Home Health Agency:: [facility] Has Agency set up a time to come to your home?: Yes First Home Health Visit Date: 05/15/24 Any new equipment or medical supplies ordered?: No  Functional Questionnaire: Do you need assistance with bathing/showering or dressing?: Yes Do you need assistance with meal preparation?: Yes Do you need assistance with eating?: No Do you have difficulty maintaining continence: Yes Do you  need assistance with getting out of bed/getting out of a chair/moving?: Yes Do you have difficulty managing or taking your medications?: No  Follow up appointments reviewed: PCP Follow-up appointment confirmed?: Yes Date of PCP follow-up appointment?: 05/23/24 Follow-up Provider: Dr. Suzzane Blanch Specialist [facility] Follow-up appointment confirmed?: Yes Date of Specialist follow-up appointment?: 05/16/24 Follow-Up Specialty  Provider:: Vascular Do you need transportation to your follow-up appointment?: No Do you understand care options if your condition(s) worsen?: Yes-patient verbalized understanding  SDOH Interventions Today    Flowsheet Row Most Recent Value  SDOH Interventions   Food Insecurity Interventions Intervention Not Indicated  Housing Interventions Intervention Not Indicated  Transportation Interventions Intervention Not Indicated  Utilities Interventions Intervention Not Indicated    Goals Addressed             This Visit's Progress    VBCI Transitions of Care (TOC) Care Plan       Problems:  Recent Hospitalization for treatment of S/P insertion of endovascular thoracic aortic stent graft 78 y.o. 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, patient undergoing workup for leukocytosis with concern of lymphoproliferative malignancy (CML versus CMML), which she missed most recent 2 appointment earlier this week due to not feeling well, and living by herself with family not aware she has appointment scheduled with oncology  05/11/24 Patient has hospital follow up for 05/23/24 05/11/24 Patient was denied SNF rehab but family working on getting personal care services  Goal:  Over the next 30 days, the patient will not experience hospital readmission  Interventions:  Transitions of Care: Doctor Visits  - discussed the importance of doctor visits Contacted provider for patient needs medication and follow up for complaints of fever, swelling in wound, drainage or pus 05/11/24 Reviewed s/s of infection and reviewed for ongoing follow up with oncology appointments  Patient Self Care Activities:  Attend all scheduled provider appointments Call pharmacy for medication refills 3-7 days in advance of running out of medications Call  provider office for new concerns or questions  Notify RN Care Manager of [facility] call rescheduling needs Participate in Transition of Care Program/Attend TOC scheduled calls Take medications as prescribed    Plan:  The care management team will reach out to the patient again over the next 5-10 business days. The patient has been provided with contact information for the care management team and has been advised to call with any health related questions or concerns.  05/11/24 Discussed and offered 30 day TOC program.  Patient agrees to weekly follow up plan.  The patient has been provided with contact information for the care management team and has been advised to call with any health -related questions or concerns.  The patient verbalized understanding with current plan of care.  The patient is directed to their insurance card regarding availability of benefits coverage.           Richerd Fish, RN, BSN, CCM [facility], [facility] Management Coordinator Direct Dial: [phone]

## 2024-05-11 NOTE — Patient Instructions (Addendum)
 Visit Information  Thank you for taking time to visit with me today. Please don't hesitate to contact me if I can be of assistance to you before our next scheduled telephone appointment.  Our next appointment is by telephone on 05/18/24 at 1500  Following is a copy of your care plan:   Goals Addressed             This Visit's Progress    VBCI Transitions of Care (TOC) Care Plan       Problems:  Recent Hospitalization for treatment of S/P insertion of endovascular thoracic aortic stent graft 78 y.o. 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, patient undergoing workup for leukocytosis with concern of lymphoproliferative malignancy (CML versus CMML), which she missed most recent 2 appointment earlier this week due to not feeling well, and living by herself with family not aware she has appointment scheduled with oncology  05/11/24 Patient has hospital follow up for 05/23/24 05/11/24 Patient was denied SNF rehab but family working on getting personal care services  Goal:  Over the next 30 days, the patient will not experience hospital readmission  Interventions:  Transitions of Care: Doctor Visits  - discussed the importance of doctor visits Contacted provider for patient needs medication and follow up for complaints of fever, swelling in wound, drainage or pus 05/11/24 Reviewed s/s of infection and reviewed for ongoing follow up with oncology appointments  Patient Self Care Activities:  Attend all scheduled provider appointments Call pharmacy for medication refills 3-7 days in advance of running out of medications Call provider office for new concerns or questions  Notify RN Care Manager of TOC call rescheduling needs Participate in Transition of Care Program/Attend TOC scheduled calls Take medications as prescribed     Plan:  The care management team will reach out to the patient again over the next 5-10 business days. The patient has been provided with contact information for the care management team and has been advised to call with any health related questions or concerns.  05/11/24 Discussed and offered 30 day TOC program.  Patient agrees to weekly follow up plan.  The patient has been provided with contact information for the care management team and has been advised to call with any health -related questions or concerns.  The patient verbalized understanding with current plan of care.  The patient is directed to their insurance card regarding availability of benefits coverage.          Patient verbalizes understanding of instructions and care plan provided today and agrees to view in MyChart. Active MyChart status and patient understanding of how to access instructions and care plan via MyChart confirmed with patient.     Telephone follow up appointment with care management team member scheduled for: The patient has been provided with contact information for the care management team and has been advised to call with any health related questions or concerns.   Please call the care guide team at [phone] if you need to cancel or reschedule your appointment.   Please call the [place]  National Suicide Prevention Lifeline: 219-134-6024 or TTY: 9340859102 TTY [phone]) to talk to a trained counselor if you are experiencing a Mental Health or Behavioral Health Crisis or need someone to talk to.  Richerd Fish, RN, BSN, CCM [facility], [facility] Management Coordinator Direct Dial: [phone]

## 2024-05-12 ENCOUNTER — Inpatient Hospital Stay: Admitting: Oncology

## 2024-05-12 ENCOUNTER — Encounter: Payer: Self-pay | Admitting: Internal Medicine

## 2024-05-12 ENCOUNTER — Other Ambulatory Visit: Payer: Self-pay | Admitting: Internal Medicine

## 2024-05-12 ENCOUNTER — Ambulatory Visit: Payer: Self-pay

## 2024-05-12 ENCOUNTER — Inpatient Hospital Stay

## 2024-05-12 DIAGNOSIS — R110 Nausea: Secondary | ICD-10-CM

## 2024-05-12 MED ORDER — ONDANSETRON HCL 4 MG PO TABS: 4.0000 mg | ORAL_TABLET | Freq: Three times a day (TID) | ORAL | 0 refills | Status: AC | PRN

## 2024-05-12 NOTE — Telephone Encounter (Signed)
 FYI Only or Action Required?: Action required by provider: cancer patient requesting medication for nausea.  Patient was last seen in primary care on 11/08/2023 by Melvenia Manus BRAVO, MD.  Called Nurse Triage reporting Nausea.  Symptoms began several weeks ago.  Interventions attempted: Rest, hydration, or home remedies and Dietary changes.  Symptoms are: unchanged.  Triage Disposition: See PCP Within 2 Weeks  Patient/caregiver understands and will follow disposition?: Yes   Copied from CRM [id number]. Topic: Clinical - Red Word Triage >> May 12, 2024  9:58 AM Larissa RAMAN wrote: [facility] that prompted transfer to Nurse Triage: nausea due to chemo. Requesting prescription Reason for Disposition  Nausea is a chronic symptom (recurrent or ongoing AND present > 4 weeks)  Answer Assessment - Initial Assessment Questions CAL notified, no appointment available until 05/26/24 At patient's request, notified niece, [name] Crome.  On her behalf, she will contact oncologist regarding nausea, but respectfully requests that Dr. Tobie call in something for nausea for her.   Pharmacy: [facility]   1. SEVERITY - NAUSEA: How bad is the nausea? (e.g., none; mild, moderate, severe)     Mild nausea in the morning 2. SEVERITY - VOMITING: Are you vomiting? If Yes, ask: How many times have you vomited in the past 24 hours? (e.g., nausea only; mild, moderate or severe vomiting)     denies 3. ONSET: When did the nausea and/or vomiting begin?      Mid October 4. INTERVENTIONS TRIED: Do you have any anti-nausea medicine? What medicines have you     Unsure of name of medicine 5. ORAL INTAKE: If vomiting, Have you been able to keep any foods or fluids down? How much fluids have you had in the past 24 hours?     No difficulty 6. HYDRATION: Any signs of dehydration? (e.g., dry mouth [not just dry lips], too weak to stand, dizziness, new weight loss) When did you last urinate?     No  dehydration 7. ABDOMEN PAIN: Are you having any abdomen pain? If Yes, ask: How bad is it? What      denies 8. DIARRHEA: Is there any diarrhea? If Yes, ask: How many times today?      Patient did have diarrhea, but is improving 9. CANCER: What type of cancer do you have?      unknown 10. CANCER - TREATMENT: What cancer treatments have you received? When did you last receive them? (e.g., chemo, immunotherapy, radiation, bone marrow transplant, or recent surgery). Note: Triager should review medical record if available.       Chemo pills, but does not know 12. CAUSE: What do you think is causing your nausea and/or vomiting? (e.g., chemotherapy, new medications, the cancer itself, other family members with similar symptoms)       chemo  14. OTHER SYMPTOMS: Do you have any other symptoms? (e.g., abdominal swelling, fever, headache, jaundice, vertigo, vomiting blood or coffee grounds)       denies  Protocols used: Cancer - Nausea and Vomiting-A-AH

## 2024-05-16 ENCOUNTER — Ambulatory Visit: Attending: Vascular Surgery | Admitting: Vascular Surgery

## 2024-05-16 ENCOUNTER — Encounter (HOSPITAL_COMMUNITY): Payer: Self-pay

## 2024-05-16 ENCOUNTER — Encounter: Payer: Self-pay | Admitting: Vascular Surgery

## 2024-05-16 VITALS — BP 119/67 | HR 91 | Temp 97.8°F | Wt 114.0 lb

## 2024-05-16 DIAGNOSIS — I7410 Embolism and thrombosis of unspecified parts of aorta: Secondary | ICD-10-CM

## 2024-05-16 NOTE — Progress Notes (Signed)
 [facility] [place]  ASSESSMENT / PLAN: 78 y.o. 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  She has done very well postoperatively.  Unfortunately she likely has CML, and is undergoing treatment with the oncologists for this.  She should continue aspirin and DOAC indefinitely while she has active malignancy.  I counseled her about good technical result from TEVAR and encouraged yearly follow-up with CT angiogram.  She is amenable.  CHIEF COMPLAINT: Abnormal CT finding  HISTORY OF PRESENT ILLNESS: Laurie Wallace is a 78 y.o. female referred to clinic for evaluation of an abnormal CT angiogram finding.  This was performed to evaluate upper abdominal and back pain in the emergency department about a month ago.  Received a call from the emergency department physician and together we made a plan for surveillance including a follow-up CT scan and initiation of anticoagulation therapy.  The patient had actually been started on anticoagulation prior in March for subsegmental PE and reports compliance with Eliquis  since that time.  The patient is otherwise fairly healthy and does not have any typical risk factors for atherosclerotic disease.  05/16/24: Returns to clinic after TEVAR.  She is doing very well.  No issues after TEVAR.  We reviewed her CT angiogram in detail.  She was admitted to the hospital and initiated therapy for CML versus CMML.  Past Medical History:  Diagnosis Date   Allergy    Anemia    Iron  Deficiency   Arthritis    Rheumatoid Arthritis   Family history of adverse reaction to anesthesia    Sister has N/V   GERD (gastroesophageal reflux disease)    Peripheral vascular disease    Pneumonia     Past Surgical History:  Procedure Laterality Date   ABDOMINAL HYSTERECTOMY  2000   IR BONE MARROW BIOPSY & ASPIRATION  05/03/2024   THORACIC AORTIC ENDOVASCULAR STENT GRAFT N/A 04/12/2024    Procedure: INSERTION, ENDOVASCULAR STENT GRAFT, AORTA, THORACIC;  Surgeon: Magda Debby SAILOR, MD;  Location: [facility] OR;  Service: Vascular;  Laterality: N/A;   TOE SURGERY Right 2015   1st toe   ULTRASOUND GUIDANCE FOR VASCULAR ACCESS Right 04/12/2024   Procedure: ULTRASOUND GUIDANCE, FOR VASCULAR ACCESS;  Surgeon: Magda Debby SAILOR, MD;  Location: [facility] OR;  Service: Vascular;  Laterality: Right;    Family History  Problem Relation Age of Onset   Cancer Mother    Cancer Father    Heart failure Sister    COPD Sister    Dementia Sister     Social History   Socioeconomic History   Marital status: Single    Spouse name: Not on file   Number of children: Not on file   Years of education: Not on file   Highest education level: Not on file  Occupational History   Occupation: full time  Tobacco Use   Smoking status: Never    Passive exposure: Never   Smokeless tobacco: Never  Vaping Use   Vaping status: Never Used  Substance and Sexual Activity   Alcohol use: No   Drug use: No   Sexual activity: Not Currently  Other Topics Concern   Not on file  Social History Narrative   Lives with her son   Right Handed   Drinks 1-2 cups caffeine daily in winter   Social Drivers of Health   Financial Resource Strain: Low Risk  (06/14/2023)   Overall Financial Resource Strain (CARDIA)  Difficulty of Paying Living Expenses: Not hard at all  Food Insecurity: No Food Insecurity (05/11/2024)   Hunger Vital Sign    Worried About Running Out of Food in the Last Year: Never true    Ran Out of Food in the Last Year: Never true  Transportation Needs: No Transportation Needs (05/11/2024)   PRAPARE - Administrator, Civil Service (Medical): No    Lack of Transportation (Non-Medical): No  Physical Activity: Sufficiently Active (06/14/2023)   Exercise Vital Sign    Days of Exercise per Week: 5 days    Minutes of Exercise per Session: 30 min  Stress: No Stress Concern Present (06/14/2023)    [employer] of Occupational Health - Occupational Stress Questionnaire    Feeling of Stress : Not at all  Social Connections: Moderately Isolated (04/28/2024)   Social Connection and Isolation Panel    Frequency of Communication with Friends and Family: More than three times a week    Frequency of Social Gatherings with Friends and Family: More than three times a week    Attends Religious Services: More than 4 times per year    Active Member of [employer] or Organizations: No    Attends Banker Meetings: Never    Marital Status: Widowed  Intimate Partner Violence: Not At Risk (05/11/2024)   Humiliation, Afraid, Rape, and Kick questionnaire    Fear of Current or Ex-Partner: No    Emotionally Abused: No    Physically Abused: No    Sexually Abused: No    No Known Allergies  Current Outpatient Medications  Medication Sig Dispense Refill   allopurinol (ZYLOPRIM) 100 MG tablet Take 1 tablet (100 mg total) by mouth daily. 30 tablet 1   aspirin EC 81 MG tablet Take 81 mg by mouth daily. Swallow whole.     ELIQUIS  5 MG TABS tablet Take 1 tablet (5 mg total) by mouth 2 (two) times daily. 60 tablet 3   famotidine  (PEPCID ) 40 MG tablet TAKE ONE TABLET BY MOUTH DAILY 90 tablet 1   folic acid  (FOLVITE ) 1 MG tablet Take 1 tablet (1 mg total) by mouth daily. 90 tablet 3   hydroxyurea (HYDREA) 500 MG capsule Take 1 capsule (500 mg total) by mouth 2 (two) times daily. May take with food to minimize GI side effects. 60 capsule 1   levocetirizine (XYZAL ) 5 MG tablet Take 1 tablet (5 mg total) by mouth every evening. 30 tablet 5   methotrexate  (RHEUMATREX) 2.5 MG tablet Take 6 tablets (15 mg total) by mouth once a week. Caution:Chemotherapy. Protect from light. (Patient taking differently: Take 15 mg by mouth every Tuesday. Caution:Chemotherapy. Protect from light.) 72 tablet 0   ondansetron  (ZOFRAN ) 4 MG tablet Take 1 tablet (4 mg total) by mouth every 8 (eight) hours as needed for nausea or  vomiting. 20 tablet 0   No current facility-administered medications for this visit.    PHYSICAL EXAM Vitals:   05/16/24 1301  BP: 119/67  Pulse: 91  Temp: 97.8 F (36.6 C)  SpO2: 98%  Weight: 114 lb (51.7 kg)   No distress Regular rate and rhythm Unlabored breathing   PERTINENT LABORATORY AND RADIOLOGIC DATA  Most recent CBC    Latest Ref Rng & Units 05/09/2024    4:20 AM 05/08/2024    4:31 AM 05/07/2024    4:45 AM  CBC  WBC 4.0 - 10.5 K/uL 26.2  35.1  48.6   Hemoglobin 12.0 - 15.0 g/dL 7.7  7.9  8.4   Hematocrit 36.0 - 46.0 % 24.4  24.4  26.8   Platelets 150 - 400 K/uL 1,213  1,370  1,618      Most recent CMP    Latest Ref Rng & Units 05/09/2024    4:20 AM 05/07/2024    4:45 AM 05/06/2024    3:34 AM  CMP  Glucose 70 - 99 mg/dL 74  84  78   BUN 8 - 23 mg/dL 11  14  12    Creatinine 0.44 - 1.00 mg/dL 8.95  8.88  8.79   Sodium 135 - 145 mmol/L 142  138  139   Potassium 3.5 - 5.1 mmol/L 4.3  4.3  4.8   Chloride 98 - 111 mmol/L 107  101  103   CO2 22 - 32 mmol/L 26  25  26    Calcium 8.9 - 10.3 mg/dL 8.9  8.9  8.7     Renal function Estimated Creatinine Clearance: 36.4 mL/min (A) (by C-G formula based on SCr of 1.04 mg/dL (H)).  Hgb A1c MFr Bld (%)  Date Value  01/10/2023 5.8 (H)   CT angiogram from September, August, and March 2025 personally reviewed in detail.  Pedunculated thrombus has grown during this timeframe and encroaches fairly significantly into the lumen of the aorta.  The thrombus has a discrete stenosis which, in my estimation, represents a high risk for embolization.  Debby SAILOR. Magda, MD FACS [facility] Phone Number: [phone] 05/16/2024 1:11 PM   Total time spent on preparing this encounter including chart review, data review, collecting history, examining the patient, and coordinating care: 60 minutes  Portions of this report may have been transcribed using voice recognition software.  Every  effort has been made to ensure accuracy; however, inadvertent computerized transcription errors may still be present.

## 2024-05-18 ENCOUNTER — Telehealth: Payer: Self-pay

## 2024-05-19 ENCOUNTER — Inpatient Hospital Stay

## 2024-05-19 ENCOUNTER — Inpatient Hospital Stay: Attending: Oncology | Admitting: Oncology

## 2024-05-19 VITALS — BP 135/50 | HR 99 | Temp 97.9°F | Resp 18 | Ht 66.0 in | Wt 115.2 lb

## 2024-05-19 DIAGNOSIS — C9310 Chronic myelomonocytic leukemia not having achieved remission: Secondary | ICD-10-CM

## 2024-05-19 DIAGNOSIS — Z86711 Personal history of pulmonary embolism: Secondary | ICD-10-CM | POA: Insufficient documentation

## 2024-05-19 DIAGNOSIS — R509 Fever, unspecified: Secondary | ICD-10-CM | POA: Insufficient documentation

## 2024-05-19 DIAGNOSIS — Z7901 Long term (current) use of anticoagulants: Secondary | ICD-10-CM | POA: Diagnosis not present

## 2024-05-19 DIAGNOSIS — E883 Tumor lysis syndrome: Secondary | ICD-10-CM | POA: Insufficient documentation

## 2024-05-19 DIAGNOSIS — Z7982 Long term (current) use of aspirin: Secondary | ICD-10-CM | POA: Diagnosis not present

## 2024-05-19 DIAGNOSIS — Z79631 Long term (current) use of antimetabolite agent: Secondary | ICD-10-CM | POA: Diagnosis not present

## 2024-05-19 DIAGNOSIS — R531 Weakness: Secondary | ICD-10-CM | POA: Diagnosis not present

## 2024-05-19 DIAGNOSIS — R040 Epistaxis: Secondary | ICD-10-CM | POA: Diagnosis not present

## 2024-05-19 DIAGNOSIS — E790 Hyperuricemia without signs of inflammatory arthritis and tophaceous disease: Secondary | ICD-10-CM | POA: Diagnosis not present

## 2024-05-19 DIAGNOSIS — D75839 Thrombocytosis, unspecified: Secondary | ICD-10-CM | POA: Diagnosis present

## 2024-05-19 DIAGNOSIS — Z79899 Other long term (current) drug therapy: Secondary | ICD-10-CM | POA: Insufficient documentation

## 2024-05-19 DIAGNOSIS — D539 Nutritional anemia, unspecified: Secondary | ICD-10-CM | POA: Diagnosis not present

## 2024-05-19 DIAGNOSIS — Z7964 Long term (current) use of myelosuppressive agent: Secondary | ICD-10-CM | POA: Diagnosis not present

## 2024-05-19 LAB — CBC WITH DIFFERENTIAL/PLATELET
Abs Immature Granulocytes: 1 K/uL — ABNORMAL HIGH (ref 0.00–0.07)
Basophils Absolute: 0.1 K/uL (ref 0.0–0.1)
Basophils Relative: 1 %
Blasts: 1 %
Eosinophils Absolute: 1.1 K/uL — ABNORMAL HIGH (ref 0.0–0.5)
Eosinophils Relative: 11 %
HCT: 23.9 % — ABNORMAL LOW (ref 36.0–46.0)
Hemoglobin: 7.4 g/dL — ABNORMAL LOW (ref 12.0–15.0)
Lymphocytes Relative: 23 %
Lymphs Abs: 2.4 K/uL (ref 0.7–4.0)
MCH: 30 pg (ref 26.0–34.0)
MCHC: 31 g/dL (ref 30.0–36.0)
MCV: 96.8 fL (ref 80.0–100.0)
Metamyelocytes Relative: 6 %
Monocytes Absolute: 1.6 K/uL — ABNORMAL HIGH (ref 0.1–1.0)
Monocytes Relative: 15 %
Myelocytes: 2 %
Neutro Abs: 4.1 K/uL (ref 1.7–7.7)
Neutrophils Relative %: 39 %
Platelets: 511 K/uL — ABNORMAL HIGH (ref 150–400)
Promyelocytes Relative: 2 %
RBC: 2.47 MIL/uL — ABNORMAL LOW (ref 3.87–5.11)
RDW: 18.7 % — ABNORMAL HIGH (ref 11.5–15.5)
Smear Review: NORMAL
WBC: 10.4 K/uL (ref 4.0–10.5)
nRBC: 0 % (ref 0.0–0.2)

## 2024-05-19 LAB — SAMPLE TO BLOOD BANK

## 2024-05-19 LAB — COMPREHENSIVE METABOLIC PANEL WITH GFR
ALT: 14 U/L (ref 0–44)
AST: 19 U/L (ref 15–41)
Albumin: 4.3 g/dL (ref 3.5–5.0)
Alkaline Phosphatase: 152 U/L — ABNORMAL HIGH (ref 38–126)
Anion gap: 12 (ref 5–15)
BUN: 9 mg/dL (ref 8–23)
CO2: 25 mmol/L (ref 22–32)
Calcium: 9.4 mg/dL (ref 8.9–10.3)
Chloride: 104 mmol/L (ref 98–111)
Creatinine, Ser: 0.95 mg/dL (ref 0.44–1.00)
GFR, Estimated: >60 mL/min (ref >60)
Glucose, Bld: 87 mg/dL (ref 70–99)
Potassium: 4.3 mmol/L (ref 3.5–5.1)
Sodium: 142 mmol/L (ref 135–145)
Total Bilirubin: 0.4 mg/dL (ref 0.0–1.2)
Total Protein: 7.2 g/dL (ref 6.5–8.1)

## 2024-05-19 LAB — RETIC PANEL
Immature Retic Fract: 9.4 % (ref 2.3–15.9)
RBC.: 2.55 MIL/uL — ABNORMAL LOW (ref 3.87–5.11)
Retic Count, Absolute: 23.5 K/uL (ref 19.0–186.0)
Retic Ct Pct: 0.9 % (ref 0.4–3.1)
Reticulocyte Hemoglobin: 30.3 pg (ref >27.9)

## 2024-05-19 LAB — IRON AND TIBC
Iron: 141 ug/dL (ref 28–170)
Saturation Ratios: 58 % — ABNORMAL HIGH (ref 10.4–31.8)
TIBC: 242 ug/dL — ABNORMAL LOW (ref 250–450)
UIBC: 101 ug/dL

## 2024-05-19 LAB — URIC ACID: Uric Acid, Serum: 6.7 mg/dL (ref 2.5–7.1)

## 2024-05-19 LAB — RETICULOCYTES
Immature Retic Fract: 12.5 % (ref 2.3–15.9)
RBC.: 2.53 MIL/uL — ABNORMAL LOW (ref 3.87–5.11)
Retic Count, Absolute: 20.7 K/uL (ref 19.0–186.0)
Retic Ct Pct: 0.8 % (ref 0.4–3.1)

## 2024-05-19 LAB — LACTATE DEHYDROGENASE: LDH: 382 U/L — ABNORMAL HIGH (ref 105–235)

## 2024-05-19 LAB — VITAMIN B12: Vitamin B-12: 1957 pg/mL — ABNORMAL HIGH (ref 180–914)

## 2024-05-19 LAB — FOLATE: Folate: >20 ng/mL (ref >5.9)

## 2024-05-19 LAB — FERRITIN: Ferritin: 986 ng/mL — ABNORMAL HIGH (ref 11–307)

## 2024-05-19 NOTE — Progress Notes (Signed)
 Patient Care Team: [name], MD as PCP - General (Internal Medicine) [name], RN as [facility] Care Management  Clinic Day:  05/19/2024  Referring physician: [name], MD   CHIEF COMPLAINT:  CC: CML   Laurie Wallace 78 y.o. female was transferred to my care after her prior physician has left.   ASSESSMENT & PLAN:   Assessment & Plan: [name]  is a 78 y.o. female with CMML  Assessment and Plan Assessment & Plan Chronic myelomonocytic leukemia (CMML), active disease Active CMML requiring immediate treatment to prevent transformation into acute leukemia. CMML prognostic score of 3, high risk, associated median survival: 11 months, cumulative probability of transformation to AML in 2 years: 54%  - We discussed the diagnosis and prognosis in detail.  Patient would require chemotherapy for control to prevent transformation to AML. - Vydaza chemotherapy planned with potential side effects including cytopenias and fatigue. Referral to Dr. Perri at [facility] for second opinion. - Discussed the need of port placement for chemotherapy administration. - Discontinued hydroxyurea. - Obtain blood work to get a baseline and assess the need for transfusion.   - Will discuss with Dr. Perri for further chemotherapy planning. - Will send myeloid NGS panel.  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   The patient understands the plans discussed today and is in agreement with them.  She knows to contact our office if she develops concerns prior to her next appointment.  72  minutes of total time was spent for this patient encounter, including preparation,review of records,  face-to-face counseling with the patient and coordination of care, physical exam, and documentation of the encounter.     LILLETTE Verneta SAUNDERS Teague,acting as a neurosurgeon for Mickiel Dry, MD.,have documented all relevant documentation on the behalf of Mickiel Dry, MD,as directed by  Mickiel Dry, MD while in the presence of Mickiel Dry, MD.  I, Mickiel Dry MD, have reviewed the above documentation for accuracy and completeness, and I agree with the above.    Verneta SAUNDERS Ege  [facility] [place] - A DEPT OF [facility] [address] [place] [address] Dept: [phone] Dept Fax: [phone]   Orders Placed This Encounter  Procedures   CBC with Differential/Platelet    Standing Status:   Future    Number of Occurrences:   1    Expected Date:   05/19/2024    Expiration Date:   08/17/2024   Comprehensive metabolic panel with GFR    Standing Status:   Future    Number of Occurrences:   1    Expected Date:   05/19/2024    Expiration Date:   08/17/2024   Ferritin    Standing Status:   Future    Number of Occurrences:   1    Expected Date:   05/19/2024    Expiration Date:   08/17/2024   Folate    Standing Status:   Future    Number of Occurrences:   1    Expected Date:   05/19/2024    Expiration Date:   08/17/2024   Vitamin B12    Standing Status:   Future    Number of Occurrences:   1    Expected Date:   05/19/2024    Expiration Date:   08/17/2024   Uric acid  Standing Status:   Future    Number of Occurrences:   1    Expected Date:   05/19/2024    Expiration Date:   08/17/2024   Lactate dehydrogenase    Standing Status:   Future    Number of Occurrences:   1    Expected Date:   05/19/2024    Expiration Date:   08/17/2024   Iron  and TIBC    Standing Status:   Future    Number of Occurrences:   1    Expected Date:   05/19/2024    Expiration Date:   08/17/2024   Retic Panel    Standing Status:   Future    Number of Occurrences:   1    Expected Date:   05/19/2024    Expiration Date:   08/17/2024   Reticulocytes     Standing Status:   Future    Number of Occurrences:   1    Expected Date:   05/19/2024    Expiration Date:   08/17/2024   Erythropoietin    Standing Status:   Future    Number of Occurrences:   1    Expected Date:   05/19/2024    Expiration Date:   08/17/2024   Sample to Blood Bank    Standing Status:   Future    Number of Occurrences:   1    Expected Date:   05/19/2024    Expiration Date:   08/17/2024   Type and screen         Standing Status:   Future    Expected Date:   05/19/2024    Expiration Date:   08/17/2024     ONCOLOGY HISTORY:   I have reviewed her chart and materials related to her cancer extensively and collaborated history with the patient. 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 RBC: 2.67. HGB: 8.8. MCV: 102.2 -04/05/2024: Ferritin: 362. Vitamin B12: 1,742. Iron  saturation: 55% -04/11/2024: Pathologist blood smear review: Moderate leukocytosis with circulating immature monocytes and blast suspicious for acute leukemia.  Background macrocytic anemia and myelodysplastic granulocytes.  -04/13/2024: Peripheral blood flow cytometry: Abnormal CD34 positive blast population constituting 11% of all cells analyzed. The blasts are positive for CD5, CD33, CD38, CD117 and HLA-DR.  -04/13/2024: CBC: WBC: 27.0. RBC:  2.06. HGB: 6.8. MCV: 102.9. PLT: 419.  -04/14/2024: NGS JAK2 E12-15/CALR/MPL testing: Negative -04/14/2024: BCR ABL1 Quant: Negative -04/14/2024: MM Panel: IgM 276, M-spike not observed. KFLC: 24.1 with normal FLC ratio of 0.95. LDH-: 339 -04/27/2024 - 05/09/2024: Presented to ED and hospitalized due to  fever and generalized weakness, found to have severe leukocytosis -04/27/2024: CBC diff: WBC: 60.5 with ANC of 30.3, absolute lymphocytes of 11.5, absolute monocytes of 3.0, and absolute eosinophils 1.2. RBC: 3.17. HGB: 9.9. MCV: 100.3. PLT: 829. -04/27/2024: Pathologist Blood smear review: Leukocytosis with left shift including blasts, promyelocytes, and myelocytes as well as monocytes.  -05/02/2024: CBC diff: WBC: 123.3 with ANC of 71.5, Ab Lymphocytes of 7.4, and Ab monocytes of 14.8. RBC: 2.66. HGB: 8.4. MCV: 100.8. PLT: 1,250.  -05/02/2024: LDH: 879. EPO: 24.7.  -05/03/2024: Bone Marrow Biopsy.  Pathology: Primary myeloid neoplasm with dysplasia and increased  blasts (10%). There is evidence of dysplasia in the myeloid/neutrophilic lineage with hyper granularity and abnormal nuclear clumping. There is a prominent thrombocytosis. The bone marrow is hypercellular essentially only 3 to 100%. This is due to a panmyelosis with prominent left shift in both lineages as well as a prominent megakaryocytic hyperplasia with significant dysplasia including hypolobulated forms, micromegakaryocytes and pawn ball nuclei. Flow cytometry identified 10% of the cells to be CD34 positive blasts with partial CD34 and HLA-DR positivity consistent with myeloid blasts. There is dim CD5 positivity of uncertain clinical significance but has been reported in high risk MDS cases.  - The main differential given the leukocytosis as well as dysplasia would include a chronic myelomonocytic leukemia (at least CMML to possible blast phase/conversion to acute myeloid leukemia), a de novo acute myeloid leukemia as well as other high-grade MDS type lesions.  -05/11/2024: Chromosome Analysis: Abnormal Female Karyotype with monosomy 7 -05/16/2024: Myeloid FISH Panel: Normal. FLT3 Mutation not detected. RUNX1: Not detected.  Normal myeloid FISH.  Current Treatment:  TBD  INTERVAL HISTORY:   Discussed the use of AI scribe software for  clinical note transcription with the patient, who gave verbal consent to proceed.  History of Present Illness Frimy Uffelman is a 78 year old female with chronic myelomonocytic leukemia (CMML) who presents for discussion of her bone marrow biopsy results and treatment options.  She was accompanied by her son and his fiance, had niece over the phone  She has been diagnosed with chronic myelomonocytic leukemia (CMML) following a bone marrow biopsy. She experiences persistent weakness and fatigue. No fevers, chills, or night sweats. She has episodes of epistaxis, which she attributes to dryness, and uses a foot spray for relief, although she forgot to use it this morning.  She is currently taking hydroxyurea, initiated during her hospital stay to manage cell counts, but it is not effective for her current condition. She is also on allopurinol for uric acid management, which she takes as part of her pill regimen.  She received two blood transfusions during her recent hospital stay. She is functional at home but may require assistance, particularly at night.    I have reviewed the past medical history, past surgical history, social history and family history with the patient and they are unchanged from previous note.  ALLERGIES:  has no known allergies.  MEDICATIONS:  Current Outpatient Medications  Medication Sig Dispense Refill   aspirin EC 81 MG tablet Take 81 mg by mouth daily. Swallow whole.     ELIQUIS  5 MG TABS tablet Take 1 tablet (5 mg total) by mouth 2 (two) times daily. 60 tablet 3   famotidine  (PEPCID ) 40 MG tablet TAKE ONE TABLET BY MOUTH DAILY 90 tablet 1   folic acid  (FOLVITE ) 1 MG tablet Take 1 tablet (1 mg total) by mouth daily. 90 tablet 3   hydroxyurea (HYDREA) 500 MG capsule Take 1 capsule (500 mg total) by mouth 2 (two) times daily. May take with food to minimize GI side effects. 60 capsule 1   levocetirizine (XYZAL ) 5 MG tablet Take 1 tablet (5 mg total) by mouth  every evening. 30 tablet 5   methotrexate  (RHEUMATREX) 2.5 MG tablet Take 6 tablets (15 mg total) by mouth once a week. Caution:Chemotherapy. Protect from light. (Patient taking differently: Take 15 mg by mouth once a week. Caution:Chemotherapy. Protect from light.taking once a week) 72 tablet 0   ondansetron  (ZOFRAN ) 4 MG tablet Take 1 tablet (4 mg total) by mouth every  8 (eight) hours as needed for nausea or vomiting. 20 tablet 0   allopurinol (ZYLOPRIM) 100 MG tablet Take 1 tablet (100 mg total) by mouth daily. (Patient not taking: Reported on 05/19/2024) 30 tablet 1   No current facility-administered medications for this visit.    REVIEW OF SYSTEMS:   Constitutional: Denies fevers, chills or abnormal weight loss Eyes: Denies blurriness of vision Ears, nose, mouth, throat, and face: Denies mucositis or sore throat Respiratory: Denies cough, or wheezes dyspnea + Cardiovascular: Denies palpitation, chest discomfort or lower extremity swelling Gastrointestinal:  Denies nausea, heartburn or change in bowel habits Skin: Denies abnormal skin rashes Lymphatics: Denies new lymphadenopathy or easy bruising Neurological:Denies numbness, tingling or new weaknesses Behavioral/Psych: Mood is stable, no new changes   All other systems were reviewed with the patient and are negative.   VITALS:  Blood pressure (!) 135/50, pulse 99, temperature 97.9 F (36.6 C), temperature source Oral, resp. rate 18, height 5' 6 (1.676 m), weight 115 lb 3.2 oz (52.3 kg), SpO2 100%.  Wt Readings from Last 3 Encounters:  05/19/24 115 lb 3.2 oz (52.3 kg)  05/16/24 114 lb (51.7 kg)  04/28/24 114 lb 3.2 oz (51.8 kg)    Body mass index is 18.59 kg/m.  Performance status (ECOG): 1 - Symptomatic but completely ambulatory  PHYSICAL EXAM:   GENERAL:alert, no distress and comfortable SKIN: skin color, texture, turgor are normal, no rashes or significant lesions LYMPH:  no palpable lymphadenopathy in the cervical,  axillary or inguinal LUNGS: clear to auscultation and percussion with normal breathing effort HEART: regular rate & rhythm and no murmurs and no lower extremity edema ABDOMEN:abdomen soft, non-tender and normal bowel sounds Musculoskeletal:no cyanosis of digits and no clubbing  NEURO: alert & oriented x 3 with fluent speech   LABORATORY DATA:  I have reviewed the data as listed   Lab Results  Component Value Date   WBC 26.2 (H) 05/09/2024   NEUTROABS 13.4 (H) 05/09/2024   HGB 7.7 (L) 05/09/2024   HCT 24.4 (L) 05/09/2024   MCV 95.3 05/09/2024   PLT 1,213 (HH) 05/09/2024      Chemistry      Component Value Date/Time   NA 142 05/19/2024 0918   NA 140 10/13/2023 1126   K 4.3 05/19/2024 0918   CL 104 05/19/2024 0918   CO2 25 05/19/2024 0918   BUN 9 05/19/2024 0918   BUN 9 10/13/2023 1126   CREATININE 0.95 05/19/2024 0918   CREATININE 1.15 (H) 03/27/2024 1205      Component Value Date/Time   CALCIUM 9.4 05/19/2024 0918   ALKPHOS 152 (H) 05/19/2024 0918   AST 19 05/19/2024 0918   ALT 14 05/19/2024 0918   BILITOT 0.4 05/19/2024 0918   BILITOT 0.3 01/29/2023 0855       Latest Reference Range & Units 05/19/24 09:19  Iron  28 - 170 ug/dL 858  UIBC ug/dL 898  TIBC 749 - 549 ug/dL 757 (L)  Saturation Ratios 10.4 - 31.8 % 58 (H)  Ferritin 11 - 307 ng/mL 986 (H)  Folate >5.9 ng/mL >20.0  Vitamin B12 180 - 914 pg/mL 1,957 (H)  (L): Data is abnormally low (H): Data is abnormally high   Latest Reference Range & Units 05/19/24 09:18  LDH 105 - 235 U/L 382 (H)  (H): Data is abnormally high   Latest Reference Range & Units 05/19/24 09:19  RBC. 3.87 - 5.11 MIL/uL 2.55 (L)  Retic Ct Pct 0.4 - 3.1 % 0.9  Retic  Count, Absolute 19.0 - 186.0 K/uL 23.5  Reticulocyte Hemoglobin >27.9 pg 30.3  Immature Retic Fract 2.3 - 15.9 % 9.4  (L): Data is abnormally low  RADIOGRAPHIC STUDIES: I have personally reviewed the radiological images as listed and agreed with the findings in the  report.

## 2024-05-20 LAB — ERYTHROPOIETIN: Erythropoietin: 35.9 m[IU]/mL — ABNORMAL HIGH (ref 2.6–18.5)

## 2024-05-23 ENCOUNTER — Ambulatory Visit (INDEPENDENT_AMBULATORY_CARE_PROVIDER_SITE_OTHER): Admitting: Internal Medicine

## 2024-05-23 ENCOUNTER — Encounter: Payer: Self-pay | Admitting: Internal Medicine

## 2024-05-23 VITALS — BP 122/62 | HR 100 | Ht 66.0 in | Wt 112.2 lb

## 2024-05-23 DIAGNOSIS — C9310 Chronic myelomonocytic leukemia not having achieved remission: Secondary | ICD-10-CM

## 2024-05-23 DIAGNOSIS — I2694 Multiple subsegmental pulmonary emboli without acute cor pulmonale: Secondary | ICD-10-CM

## 2024-05-23 DIAGNOSIS — Z09 Encounter for follow-up examination after completed treatment for conditions other than malignant neoplasm: Secondary | ICD-10-CM | POA: Diagnosis not present

## 2024-05-23 DIAGNOSIS — M059 Rheumatoid arthritis with rheumatoid factor, unspecified: Secondary | ICD-10-CM

## 2024-05-23 DIAGNOSIS — R531 Weakness: Secondary | ICD-10-CM

## 2024-05-23 DIAGNOSIS — F411 Generalized anxiety disorder: Secondary | ICD-10-CM

## 2024-05-23 DIAGNOSIS — I7410 Embolism and thrombosis of unspecified parts of aorta: Secondary | ICD-10-CM

## 2024-05-23 MED ORDER — HYDROXYZINE HCL 10 MG PO TABS: 10.0000 mg | ORAL_TABLET | Freq: Two times a day (BID) | ORAL | 2 refills | Status: DC | PRN

## 2024-05-23 NOTE — Progress Notes (Signed)
 Established Patient Office Visit  Subjective:  Patient ID: Laurie Wallace, female    DOB: 09/12/1945  Age: 79 y.o. MRN: [id number]  CC:  Chief Complaint  Patient presents with   Medical Management of Chronic Issues    6 month f/u , reports back pain .     HPI Laurie Wallace is a 78 y.o. female with past medical history of RA, CML and osteoporosis who presents for follow-up after recent hospitalization.  She presented to ED secondary to generalized weakness, fatigue, left lower extremity pain, denies fever, chills, chest pain or shortness of breath. She had missed 2 appointment earlier in that week due to not feeling well, and living by herself, with family not aware she had appointment scheduled with oncology. She was evaluated by Heme/Onc. in the hospital for concern for acute leukemic conversion.  She had bone marrow biopsy done, which showed chronic myelomonocytic leukemia.  She had 1 unit of PRBC transfusion during last admission.  She reports improvement in fatigue overall, but still has low back pain.  She was advised to take scheduled Tylenol  during recent hospitalization, but has not been taking it recently.  She takes methotrexate  for RA currently.  GAD: She has been feeling anxious lately.  She was living by herself till recently, but her sister has moved in with her now, which has helped with anxiety.  She has difficulty initiating and maintaining sleep recently.  She feels low as she has not been able to do her routine activities recently and had to leave her job as well due to recent hospitalizations.  Denies SI or HI currently.  Past Medical History:  Diagnosis Date   Allergy    Anemia    Iron  Deficiency   Arthritis    Rheumatoid Arthritis   Family history of adverse reaction to anesthesia    Sister has N/V   GERD (gastroesophageal reflux disease)    Peripheral vascular disease    Pneumonia     Past Surgical History:  Procedure Laterality Date    ABDOMINAL HYSTERECTOMY  2000   IR BONE MARROW BIOPSY & ASPIRATION  05/03/2024   THORACIC AORTIC ENDOVASCULAR STENT GRAFT N/A 04/12/2024   Procedure: INSERTION, ENDOVASCULAR STENT GRAFT, AORTA, THORACIC;  Surgeon: Magda Debby SAILOR, MD;  Location: [facility] OR;  Service: Vascular;  Laterality: N/A;   TOE SURGERY Right 2015   1st toe   ULTRASOUND GUIDANCE FOR VASCULAR ACCESS Right 04/12/2024   Procedure: ULTRASOUND GUIDANCE, FOR VASCULAR ACCESS;  Surgeon: Magda Debby SAILOR, MD;  Location: [facility] OR;  Service: Vascular;  Laterality: Right;    Family History  Problem Relation Age of Onset   Cancer Mother    Cancer Father    Heart failure Sister    COPD Sister    Dementia Sister     Social History   Socioeconomic History   Marital status: Single    Spouse name: Not on file   Number of children: Not on file   Years of education: Not on file   Highest education level: Not on file  Occupational History   Occupation: full time  Tobacco Use   Smoking status: Never    Passive exposure: Never   Smokeless tobacco: Never  Vaping Use   Vaping status: Never Used  Substance and Sexual Activity   Alcohol use: No   Drug use: No   Sexual activity: Not Currently  Other Topics Concern   Not on file  Social History Narrative   Lives with  her son   Right Handed   Drinks 1-2 cups caffeine daily in winter   Social Drivers of Health   Financial Resource Strain: Low Risk  (06/14/2023)   Overall Financial Resource Strain (CARDIA)    Difficulty of Paying Living Expenses: Not hard at all  Food Insecurity: No Food Insecurity (05/19/2024)   Hunger Vital Sign    Worried About Running Out of Food in the Last Year: Never true    Ran Out of Food in the Last Year: Never true  Transportation Needs: No Transportation Needs (05/19/2024)   PRAPARE - Administrator, Civil Service (Medical): No    Lack of Transportation (Non-Medical): No  Physical Activity: Sufficiently Active (06/14/2023)   Exercise  Vital Sign    Days of Exercise per Week: 5 days    Minutes of Exercise per Session: 30 min  Stress: No Stress Concern Present (06/14/2023)   [employer] of Occupational Health - Occupational Stress Questionnaire    Feeling of Stress : Not at all  Social Connections: Moderately Isolated (04/28/2024)   Social Connection and Isolation Panel    Frequency of Communication with Friends and Family: More than three times a week    Frequency of Social Gatherings with Friends and Family: More than three times a week    Attends Religious Services: More than 4 times per year    Active Member of [employer] or Organizations: No    Attends Banker Meetings: Never    Marital Status: Widowed  Intimate Partner Violence: Not At Risk (05/19/2024)   Humiliation, Afraid, Rape, and Kick questionnaire    Fear of Current or Ex-Partner: No    Emotionally Abused: No    Physically Abused: No    Sexually Abused: No    Outpatient Medications Prior to Visit  Medication Sig Dispense Refill   allopurinol (ZYLOPRIM) 100 MG tablet Take 1 tablet (100 mg total) by mouth daily. 30 tablet 1   aspirin EC 81 MG tablet Take 81 mg by mouth daily. Swallow whole.     ELIQUIS  5 MG TABS tablet Take 1 tablet (5 mg total) by mouth 2 (two) times daily. 60 tablet 3   famotidine  (PEPCID ) 40 MG tablet TAKE ONE TABLET BY MOUTH DAILY 90 tablet 1   folic acid  (FOLVITE ) 1 MG tablet Take 1 tablet (1 mg total) by mouth daily. 90 tablet 3   levocetirizine (XYZAL ) 5 MG tablet Take 1 tablet (5 mg total) by mouth every evening. 30 tablet 5   ondansetron  (ZOFRAN ) 4 MG tablet Take 1 tablet (4 mg total) by mouth every 8 (eight) hours as needed for nausea or vomiting. 20 tablet 0   hydroxyurea (HYDREA) 500 MG capsule Take 1 capsule (500 mg total) by mouth 2 (two) times daily. May take with food to minimize GI side effects. 60 capsule 1   methotrexate  (RHEUMATREX) 2.5 MG tablet Take 6 tablets (15 mg total) by mouth once a week.  Caution:Chemotherapy. Protect from light. (Patient not taking: Reported on 05/23/2024) 72 tablet 0   No facility-administered medications prior to visit.    No Known Allergies  ROS Review of Systems  Constitutional:  Positive for fatigue. Negative for chills and fever.  HENT:  Positive for congestion and postnasal drip. Negative for sinus pressure, sinus pain and sore throat.   Eyes:  Negative for pain and discharge.  Respiratory:  Positive for cough. Negative for shortness of breath.   Cardiovascular:  Negative for chest pain and palpitations.  Gastrointestinal:  Negative for constipation, diarrhea, nausea and vomiting.  Endocrine: Negative for polydipsia and polyuria.  Genitourinary:  Negative for dysuria and hematuria.  Musculoskeletal:  Positive for arthralgias, back pain and myalgias. Negative for neck pain and neck stiffness.  Skin:  Negative for rash.  Neurological:  Positive for weakness. Negative for dizziness.  Psychiatric/Behavioral:  Negative for agitation and behavioral problems.       Objective:    Physical Exam Vitals reviewed.  Constitutional:      General: She is not in acute distress.    Appearance: She is not diaphoretic.  HENT:     Head: Normocephalic and atraumatic.     Nose: Nose normal.     Mouth/Throat:     Mouth: Mucous membranes are moist.  Eyes:     General: No scleral icterus.    Extraocular Movements: Extraocular movements intact.  Cardiovascular:     Rate and Rhythm: Normal rate and regular rhythm.     Heart sounds: Normal heart sounds. No murmur heard. Pulmonary:     Breath sounds: Normal breath sounds. No wheezing or rales.  Abdominal:     Palpations: Abdomen is soft.     Tenderness: There is no abdominal tenderness.  Musculoskeletal:        General: Deformity (Small joints of b/l hands with mild ulnar deviation) present.     Cervical back: Neck supple. No tenderness.     Lumbar back: Tenderness present. Negative right straight leg  raise test and negative left straight leg raise test.     Right lower leg: No edema.     Left lower leg: No edema.  Skin:    General: Skin is warm.     Findings: No rash.  Neurological:     General: No focal deficit present.     Mental Status: She is alert and oriented to person, place, and time.     Sensory: No sensory deficit.     Motor: Weakness (B/l LE - 4/5) present.  Psychiatric:        Mood and Affect: Mood normal.        Behavior: Behavior normal.     BP 122/62   Pulse 100   Ht 5' 6 (1.676 m)   Wt 112 lb 3.2 oz (50.9 kg)   SpO2 95%   BMI 18.11 kg/m  Wt Readings from Last 3 Encounters:  05/23/24 112 lb 3.2 oz (50.9 kg)  05/19/24 115 lb 3.2 oz (52.3 kg)  05/16/24 114 lb (51.7 kg)    Lab Results  Component Value Date   TSH 1.430 05/05/2024   Lab Results  Component Value Date   WBC 10.4 05/19/2024   HGB 7.4 (L) 05/19/2024   HCT 23.9 (L) 05/19/2024   MCV 96.8 05/19/2024   PLT 511 (H) 05/19/2024   Lab Results  Component Value Date   NA 142 05/19/2024   K 4.3 05/19/2024   CO2 25 05/19/2024   GLUCOSE 87 05/19/2024   BUN 9 05/19/2024   CREATININE 0.95 05/19/2024   BILITOT 0.4 05/19/2024   ALKPHOS 152 (H) 05/19/2024   AST 19 05/19/2024   ALT 14 05/19/2024   PROT 7.2 05/19/2024   ALBUMIN 4.3 05/19/2024   CALCIUM 9.4 05/19/2024   ANIONGAP 12 05/19/2024   EGFR 49 (L) 03/27/2024   No results found for: CHOL No results found for: HDL No results found for: LDLCALC No results found for: TRIG No results found for: [facility] Lab Results  Component Value Date  HGBA1C 5.8 (H) 01/10/2023      Assessment & Plan:   Problem List Items Addressed This Visit       Cardiovascular and Mediastinum   Pulmonary embolism (HCC)   Unprovoked PE noted on CT chest in 03/25 On Eliquis  5 mg twice daily Had lupus anticoagulant positive, followed by Hematology      Aortic thrombus [facility])   S/p endovascular thoracic aortic stent placement in 10/25 Followed  by vascular surgery On aspirin 81 mg QD and Eliquis  5 mg twice daily        Musculoskeletal and Integument   Seropositive rheumatoid arthritis (HCC)   Followed by Rheumatology - last note reviewed Symptoms better with Methotrexate  Continue folic acid  Tylenol  arthritis as needed for pain, unable to take oral NSAIDs due to anticoagulation        Other   Hospital discharge follow-up   Hospital chart reviewed, including discharge summary Medications reconciled and reviewed with the patient in detail      Generalized weakness - Primary   Was recently admitted for generalized weakness, was likely due to CMML S/p 1 U PRBC transfusion for anemia Followed by Heme-onc. Advised to increase fluid intake to at least 50 ounces per day and eat at regular intervals      Chronic myelomonocytic leukemia not having achieved remission (HCC)   Recently diagnosed, had bone marrow biopsy Followed by Hematology Oncology - was placed on Hydroxyurea, but did not get adequate response, planned to start chemotherapy      GAD (generalized anxiety disorder)   Uncontrolled, due to recent diagnosis of CMML and feeling lonely at home Discussed about medical treatment options, and its potential benefits and side effects She prefers to take medicine on a as needed basis Started hydroxyzine 10 mg twice daily as needed for anxiety If persistent anxiety, can consider mirtazapine, which can also improve her appetite      Relevant Medications   hydrOXYzine (ATARAX) 10 MG tablet   Other Relevant Orders   Ambulatory referral to Psychology    Meds ordered this encounter  Medications   hydrOXYzine (ATARAX) 10 MG tablet    Sig: Take 1 tablet (10 mg total) by mouth 2 (two) times daily as needed for anxiety.    Dispense:  30 tablet    Refill:  2    Follow-up: Return in about 4 months (around 09/20/2024) for GAD.    Suzzane MARLA Blanch, MD

## 2024-05-23 NOTE — Assessment & Plan Note (Addendum)
 Recently diagnosed, had bone marrow biopsy Followed by Hematology Oncology - was placed on Hydroxyurea, but did not get adequate response, planned to start chemotherapy

## 2024-05-23 NOTE — Assessment & Plan Note (Signed)
 Uncontrolled, due to recent diagnosis of CMML and feeling lonely at home Discussed about medical treatment options, and its potential benefits and side effects She prefers to take medicine on a as needed basis Started hydroxyzine 10 mg twice daily as needed for anxiety If persistent anxiety, can consider mirtazapine, which can also improve her appetite

## 2024-05-23 NOTE — Assessment & Plan Note (Addendum)
 Unprovoked PE noted on CT chest in 03/25 On Eliquis  5 mg twice daily Had lupus anticoagulant positive, followed by Hematology

## 2024-05-23 NOTE — Assessment & Plan Note (Addendum)
 Hospital chart reviewed, including discharge summary Medications reconciled and reviewed with the patient in detail

## 2024-05-23 NOTE — Patient Instructions (Signed)
 Please take Hydroxyzine as needed for anxiety.  Please continue to take medications as prescribed.  Please eat at regular intervals and ambulate as tolerated.

## 2024-05-23 NOTE — Assessment & Plan Note (Addendum)
 Followed by Rheumatology - last note reviewed Symptoms better with Methotrexate  Continue folic acid  Tylenol  arthritis as needed for pain, unable to take oral NSAIDs due to anticoagulation

## 2024-05-23 NOTE — Assessment & Plan Note (Signed)
 S/p endovascular thoracic aortic stent placement in 10/25 Followed by vascular surgery On aspirin 81 mg QD and Eliquis  5 mg twice daily

## 2024-05-23 NOTE — Progress Notes (Signed)
 Message received that [name], patient's niece was requesting patient and plan of care update. Called [name] and discussed that [facility] Heme-Pathology is reviewing patient's pathology so that Dr. Davonna and Dr. Perri can discuss plan of care moving forward. Advised that we will reach out as soon as the providers have discussed the best treatment plan.  Florence Purchase, patient's son, regarding FMLA papers request. After discussion with patient's son, at this time, we will continue to hold FMLA papers until treatment plan solidified so that we can complete papers accurately. Purchase will call me back should his employer want it filled out with definitive treatment plan.

## 2024-05-23 NOTE — Assessment & Plan Note (Signed)
 Was recently admitted for generalized weakness, was likely due to CMML S/p 1 U PRBC transfusion for anemia Followed by Heme-onc. Advised to increase fluid intake to at least 50 ounces per day and eat at regular intervals

## 2024-05-24 ENCOUNTER — Encounter (HOSPITAL_COMMUNITY): Payer: Self-pay

## 2024-05-26 ENCOUNTER — Inpatient Hospital Stay: Payer: Self-pay | Admitting: Family Medicine

## 2024-05-29 ENCOUNTER — Other Ambulatory Visit: Payer: Self-pay

## 2024-05-29 DIAGNOSIS — C9310 Chronic myelomonocytic leukemia not having achieved remission: Secondary | ICD-10-CM

## 2024-05-29 DIAGNOSIS — I2699 Other pulmonary embolism without acute cor pulmonale: Secondary | ICD-10-CM

## 2024-05-29 DIAGNOSIS — D75839 Thrombocytosis, unspecified: Secondary | ICD-10-CM

## 2024-05-29 DIAGNOSIS — Z1231 Encounter for screening mammogram for malignant neoplasm of breast: Secondary | ICD-10-CM

## 2024-05-29 DIAGNOSIS — E790 Hyperuricemia without signs of inflammatory arthritis and tophaceous disease: Secondary | ICD-10-CM

## 2024-05-30 ENCOUNTER — Other Ambulatory Visit: Payer: Self-pay | Admitting: Oncology

## 2024-05-30 ENCOUNTER — Inpatient Hospital Stay

## 2024-05-30 DIAGNOSIS — E790 Hyperuricemia without signs of inflammatory arthritis and tophaceous disease: Secondary | ICD-10-CM

## 2024-05-30 DIAGNOSIS — C9310 Chronic myelomonocytic leukemia not having achieved remission: Secondary | ICD-10-CM

## 2024-05-30 DIAGNOSIS — D75839 Thrombocytosis, unspecified: Secondary | ICD-10-CM

## 2024-05-30 DIAGNOSIS — Z1231 Encounter for screening mammogram for malignant neoplasm of breast: Secondary | ICD-10-CM

## 2024-05-30 DIAGNOSIS — I2699 Other pulmonary embolism without acute cor pulmonale: Secondary | ICD-10-CM

## 2024-05-30 LAB — COMPREHENSIVE METABOLIC PANEL WITH GFR
ALT: 11 U/L (ref 0–44)
AST: 24 U/L (ref 15–41)
Albumin: 4.5 g/dL (ref 3.5–5.0)
Alkaline Phosphatase: 114 U/L (ref 38–126)
Anion gap: 10 (ref 5–15)
BUN: 6 mg/dL — ABNORMAL LOW (ref 8–23)
CO2: 25 mmol/L (ref 22–32)
Calcium: 9.4 mg/dL (ref 8.9–10.3)
Chloride: 106 mmol/L (ref 98–111)
Creatinine, Ser: 1.05 mg/dL — ABNORMAL HIGH (ref 0.44–1.00)
GFR, Estimated: 54 mL/min — ABNORMAL LOW (ref >60)
Glucose, Bld: 82 mg/dL (ref 70–99)
Potassium: 4.5 mmol/L (ref 3.5–5.1)
Sodium: 141 mmol/L (ref 135–145)
Total Bilirubin: 0.3 mg/dL (ref 0.0–1.2)
Total Protein: 7 g/dL (ref 6.5–8.1)

## 2024-05-30 LAB — CBC WITH DIFFERENTIAL/PLATELET
Abs Immature Granulocytes: 12.4 K/uL — ABNORMAL HIGH (ref 0.00–0.07)
Band Neutrophils: 1 %
Basophils Absolute: 1.8 K/uL — ABNORMAL HIGH (ref 0.0–0.1)
Basophils Relative: 2 %
Eosinophils Absolute: 8 K/uL — ABNORMAL HIGH (ref 0.0–0.5)
Eosinophils Relative: 9 %
HCT: 21.7 % — ABNORMAL LOW (ref 36.0–46.0)
Hemoglobin: 7 g/dL — ABNORMAL LOW (ref 12.0–15.0)
Lymphocytes Relative: 29 %
Lymphs Abs: 25.7 K/uL — ABNORMAL HIGH (ref 0.7–4.0)
MCH: 31.4 pg (ref 26.0–34.0)
MCHC: 32.3 g/dL (ref 30.0–36.0)
MCV: 97.3 fL (ref 80.0–100.0)
Metamyelocytes Relative: 6 %
Monocytes Absolute: 11.5 K/uL — ABNORMAL HIGH (ref 0.1–1.0)
Monocytes Relative: 13 %
Myelocytes: 5 %
Neutro Abs: 29.2 K/uL — ABNORMAL HIGH (ref 1.7–7.7)
Neutrophils Relative %: 32 %
Platelets: 1275 K/uL (ref 150–400)
Promyelocytes Relative: 3 %
RBC: 2.23 MIL/uL — ABNORMAL LOW (ref 3.87–5.11)
RDW: 20.3 % — ABNORMAL HIGH (ref 11.5–15.5)
WBC: 88.5 K/uL (ref 4.0–10.5)
nRBC: 0.2 % (ref 0.0–0.2)

## 2024-05-30 LAB — URIC ACID: Uric Acid, Serum: 7.8 mg/dL — ABNORMAL HIGH (ref 2.5–7.1)

## 2024-05-30 LAB — LACTATE DEHYDROGENASE: LDH: 492 U/L — ABNORMAL HIGH (ref 105–235)

## 2024-05-30 NOTE — Progress Notes (Unsigned)
 CRITICAL VALUE ALERT Critical value received:  WBC 88.5, platelets 1,275 Date of notification:  05-30-24 Time of notification: 0919 Critical value read back:  Yes.   Nurse who received alert:  C.PageRN MD notified time and response:  Dr. Davonna, no new orders at this time.

## 2024-05-30 NOTE — Progress Notes (Signed)
 CRITICAL VALUE ALERT Critical value received:  WBC 88.5, platelets 1,275 Date of notification:  05-30-24 Time of notification: 0919 Critical value read back:  Yes.   Nurse who received alert:  C.PageRN MD notified time and response:  Dr. Davonna, no new orders at this time.

## 2024-05-31 ENCOUNTER — Inpatient Hospital Stay

## 2024-05-31 ENCOUNTER — Encounter: Payer: Self-pay | Admitting: Oncology

## 2024-05-31 VITALS — BP 107/62 | HR 83 | Temp 98.3°F | Resp 16

## 2024-05-31 DIAGNOSIS — C9310 Chronic myelomonocytic leukemia not having achieved remission: Secondary | ICD-10-CM

## 2024-05-31 LAB — PREPARE RBC (CROSSMATCH)

## 2024-05-31 MED ORDER — ACETAMINOPHEN 325 MG PO TABS
650.0000 mg | ORAL_TABLET | Freq: Once | ORAL | Status: AC
  Administered 2024-05-31: 650 mg via ORAL
  Filled 2024-05-31: qty 2

## 2024-05-31 MED ORDER — SODIUM CHLORIDE 0.9% IV SOLUTION
250.0000 mL | INTRAVENOUS | Status: DC
  Administered 2024-05-31: 100 mL via INTRAVENOUS

## 2024-05-31 MED ORDER — DIPHENHYDRAMINE HCL 25 MG PO CAPS
25.0000 mg | ORAL_CAPSULE | Freq: Once | ORAL | Status: AC
  Administered 2024-05-31: 25 mg via ORAL
  Filled 2024-05-31: qty 1

## 2024-05-31 NOTE — Progress Notes (Signed)

## 2024-05-31 NOTE — Patient Instructions (Addendum)
 [facility] Chemotherapy Teaching     You are diagnosed with chronic myelomonocytic leukemia.  You will be treated in the clinic on days 1-7 (Monday-Friday, then return Monday and Tuesday of the following week).  That is considered one cycle of chemotherapy will repeat every 28 days (from the first day of each treatment).  The drug we will give you is called azacitadine (Vidaza).  The intent of treatment is to control this disease, prevent it from worsening, and to alleviate any symptoms you may be having related to this disease.  You will see the doctor regularly throughout treatment.  We will obtain blood work from you on the first day of treatment and the fifth day of treatment each cycle to monitor your results to make sure it is safe to give your treatment. The doctor monitors your response to treatment by the way you are feeling, your blood work, and by obtaining scans periodically.  There will be wait times while you are here for treatment.  It will take about 30 minutes to 1 hour for your lab work to result.  Then there will be wait times while pharmacy mixes your medications.       Medications you will receive in the clinic prior to your chemotherapy medications:   Aloxi:  ALOXI is used in adults to help prevent nausea and vomiting that happens with certain chemotherapy drugs.  Aloxi is a long acting medication, and will remain in your system for about two days.    Dexamethasone:  This is a steroid given prior to chemotherapy to help prevent allergic reactions; it may also help prevent and control nausea and diarrhea.        Azacitidine (Vidaza)   About This Drug Azacitidine is used to treat cancer. It is given in the vein (IV).  It will take 15 minutes to infuse.     Possible Side Effects  Bone marrow suppression. This is a decrease in the number of white blood cells, red blood cells, and platelets. This may raise your risk of infection, make you tired and weak, and raise  your risk of bleeding.    Fever and chills    Nausea and vomiting (throwing up)    Constipation (not able to move bowels)    Diarrhea (loose bowel movements)    Decreased potassium    Weakness    Bruising    Skin and tissue irritation including redness, pain, warmth, or swelling at the injection site.    Petechiae. Tiny red spots on the skin, often from low platelets.   Note: Each of the side effects above was reported in 30% or greater of patients treated with azacitidine. Not all possible side effects are included above.     Warnings and Precautions    Risk of changes in your liver function if you have underlying liver disease.    Changes in your kidney function, which can cause kidney failure and be life-threatening.    Tumor lysis syndrome which can be life-threatening: This drug may act on the cancer cells very quickly. This may affect how your kidneys work.     Important Information    This drug may be present in the saliva, tears, sweat, urine, stool, vomit, semen, and vaginal secretions. Talk to your doctor and/or your nurse about the necessary precautions to take during this time.     Treating Side Effects  Manage tiredness by pacing your activities for the day.    Be sure to  include periods of rest between energy-draining activities.    To help decrease the risk of infections, wash your hands regularly.    Avoid close contact with people who have a cold, the flu, or other infections.    Take your temperature as your doctor or nurse tells you, and whenever you feel like you may have a fever.    To help decrease the risk of bleeding, use a soft toothbrush. Check with your nurse before using dental floss.    Be very careful when using knives or tools.    Use an electric shaver instead of a razor.    Drink plenty of fluids (a minimum of eight glasses per day is recommended).    If you throw up or have diarrhea, you should drink more fluids so that you do  not become dehydrated (lack of water in the body from losing too much fluid).    To help with nausea and vomiting, eat small, frequent meals instead of three large meals a day. Choose foods and drinks that are at room temperature. Ask your nurse or doctor about other helpful tips and medicine that is available to help stop or lessen these symptoms.    If you have diarrhea, eat low-fiber foods that are high in protein and calories and avoid foods that can irritate your digestive tracts or lead to cramping.    If you are not able to move your bowels, check with your doctor or nurse before you use enemas, laxatives, or suppositories.    Ask your nurse or doctor about medicine that can lessen or stop your diarrhea and/or constipation.   Food and Drug Interactions  There are no known interactions of azacitidine with food.    This drug may interact with other medicines. Tell your doctor and pharmacist about all the medicines and dietary supplements (vitamins, minerals, herbs, and others) that you are taking at this time. Also, check with your doctor or pharmacist before starting any new prescription or over-the-counter medicines, or dietary supplements to make sure that there are no interactions.   When to Call the Doctor   Call your doctor or nurse if you have any of these symptoms and/or any new or unusual symptoms:  Fever of 100.4 F (38 C) or higher    Chills    Tiredness that interferes with your daily activities    Feeling dizzy or lightheaded    Easy bleeding or bruising    Nausea that stops you from eating or drinking and/or is not relieved by prescribed medicines    Throwing up    Diarrhea, 4 times in one day or diarrhea with lack of strength or a feeling of being dizzy    No bowel movement in 3 days or when you feel uncomfortable    Pain, redness, or swelling at the site of the injection    Any new tiny red spots on the skin    Decreased and/or dark urine    Signs of  possible liver problems: dark urine, pale bowel movements, pain in your abdomen, feeling very tired and weak, unusual itching, or yellowing of the eyes or skin    Signs of tumor lysis: confusion or agitation, decreased urine, nausea/vomiting, diarrhea, muscle cramping, numbness and/or tingling, seizures    If you think you may be pregnant or may have impregnated your partner   Reproduction Warnings  Pregnancy warning: This drug can have harmful effects on the unborn baby. Women of childbearing potential and men with  female partners of childbearing potential should use effective methods of birth control during your cancer treatment. Let your doctor know right away if you think you may be pregnant or may have impregnated your partner.    Breastfeeding warning: It is not known if this drug passes into breast milk. Women should not breastfeed during treatment because this drug could enter the breast milk and cause harm to a breastfeeding baby.    Fertility warning: In men and women both, this drug may affect your ability to have children in the future. Talk with your doctor or nurse if you plan to have children. Ask for information on sperm or egg banking.     SELF CARE ACTIVITIES WHILE RECEIVING CHEMOTHERAPY:   Hydration Increase your fluid intake 48 hours prior to treatment and drink at least 8 to 12 cups (64 ounces) of water/decaffeinated beverages per day after treatment. You can still have your cup of coffee or soda but these beverages do not count as part of your 8 to 12 cups that you need to drink daily. No alcohol intake.   Medications Continue taking your normal prescription medication as prescribed.  If you start any new herbal or new supplements please let us  know first to make sure it is safe.   Mouth Care Have teeth cleaned professionally before starting treatment. Keep dentures and partial plates clean. Use soft toothbrush and do not use mouthwashes that contain alcohol. Biotene is  a good mouthwash that is available at most pharmacies or may be ordered by calling [phone]. Use warm salt water gargles (1 teaspoon salt per 1 quart warm water) before and after meals and at bedtime. If you need dental work, please let the doctor know before you go for your appointment so that we can coordinate the best possible time for you in regards to your chemo regimen. You need to also let your dentist know that you are actively taking chemo. We may need to do labs prior to your dental appointment.   Skin Care Always use sunscreen that has not expired and with SPF (Sun Protection Factor) of 50 or higher. Wear hats to protect your head from the sun. Remember to use sunscreen on your hands, ears, face, & feet.  Use good moisturizing lotions such as udder cream, eucerin, or even Vaseline. Some chemotherapies can cause dry skin, color changes in your skin and nails.     Avoid long, hot showers or baths. Use gentle, fragrance-free soaps and laundry detergent. Use moisturizers, preferably creams or ointments rather than lotions because the thicker consistency is better at preventing skin dehydration. Apply the cream or ointment within 15 minutes of showering. Reapply moisturizer at night, and moisturize your hands every time after you wash them.   Hair Loss (if your doctor says your hair will fall out)   If your doctor says that your hair is likely to fall out, decide before you begin chemo whether you want to wear a wig. You may want to shop before treatment to match your hair color. Hats, turbans, and scarves can also camouflage hair loss, although some people prefer to leave their heads uncovered. If you go bare-headed outdoors, be sure to use sunscreen on your scalp. Cut your hair short. It eases the inconvenience of shedding lots of hair, but it also can reduce the emotional impact of watching your hair fall out. Don't perm or color your hair during chemotherapy. Those chemical treatments  are already damaging to hair and can enhance  hair loss. Once your chemo treatments are done and your hair has grown back, it's OK to resume dyeing or perming hair.   With chemotherapy, hair loss is almost always temporary. But when it grows back, it may be a different color or texture. In older adults who still had hair color before chemotherapy, the new growth may be completely gray.  Often, new hair is very fine and soft.   Infection Prevention Please wash your hands for at least 30 seconds using warm soapy water. Handwashing is the #1 way to prevent the spread of germs. Stay away from sick people or people who are getting over a cold. If you develop respiratory systems such as green/yellow mucus production or productive cough or persistent cough let us  know and we will see if you need an antibiotic. It is a good idea to keep a pair of gloves on when going into grocery stores/[employer] to decrease your risk of coming into contact with germs on the carts, etc. Carry alcohol hand gel with you at all times and use it frequently if out in public. If your temperature reaches 100.4 or higher please call the clinic and let us  know.  If it is after hours or on the weekend please go to the ER if your temperature is over 100.4.  Please have your own personal thermometer at home to use.     Sex and bodily fluids If you are going to have sex, a condom must be used to protect the person that isn't taking chemotherapy. Chemo can decrease your libido (sex drive). For a few days after chemotherapy, chemotherapy can be excreted through your bodily fluids.  When using the toilet please close the lid and flush the toilet twice.  Do this for a few day after you have had chemotherapy.    Effects of chemotherapy on your sex life Some changes are simple and won't last long. They won't affect your sex life permanently.   Sometimes you may feel: too tired not strong enough to be very active sick or sore  not in the  mood anxious or low   Your anxiety might not seem related to sex. For example, you may be worried about the cancer and how your treatment is going. Or you may be worried about money, or about how you family are coping with your illness.  These things can cause stress, which can affect your interest in sex. It's important to talk to your partner about how you feel.  Remember - the changes to your sex life don't usually last long. There's usually no medical reason to stop having sex during chemo. The drugs won't have any long term physical effects on your performance or enjoyment of sex. Cancer can't be passed on to your partner during sex   Contraception It's important to use reliable contraception during treatment. Avoid getting pregnant while you or your partner are having chemotherapy. This is because the drugs may harm the baby. Sometimes chemotherapy drugs can leave a man or woman infertile.  This means you would not be able to have children in the future. You might want to talk to someone about permanent infertility. It can be very difficult to learn that you may no longer be able to have children. Some people find counselling helpful. There might be ways to preserve your fertility, although this is easier for men than for women. You may want to speak to a fertility expert. You can talk about sperm banking or harvesting your  eggs. You can also ask about other fertility options, such as donor eggs. If you have or have had breast cancer, your doctor might advise you not to take the contraceptive pill. This is because the hormones in it might affect the cancer. It is not known for sure whether or not chemotherapy drugs can be passed on through semen or secretions from the vagina. Because of this some doctors advise people to use a barrier method if you have sex during treatment. This applies to vaginal, anal or oral sex. Generally, doctors advise a barrier method only for the time you are actually having the  treatment and for about a week after your treatment. Advice like this can be worrying, but this does not mean that you have to avoid being intimate with your partner. You can still have close contact with your partner and continue to enjoy sex.   Animals If you have cats or birds we just ask that you not change the litter or change the cage.  Please have someone else do this for you while you are on chemotherapy.    Food Safety During and After Cancer Treatment Food safety is important for people both during and after cancer treatment. Cancer and cancer treatments, such as chemotherapy, radiation therapy, and stem cell/bone marrow transplantation, often weaken the immune system. This makes it harder for your body to protect itself from foodborne illness, also called food poisoning. Foodborne illness is caused by eating food that contains harmful bacteria, parasites, or viruses.   Foods to avoid Some foods have a higher risk of becoming tainted with bacteria. These include: Unwashed fresh fruit and vegetables, especially leafy vegetables that can hide dirt and other contaminants Raw sprouts, such as alfalfa sprouts Raw or undercooked beef, especially ground beef, or other raw or undercooked meat and poultry Fatty, fried, or spicy foods immediately before or after treatment.  These can sit heavy on your stomach and make you feel nauseous. Raw or undercooked shellfish, such as oysters. Sushi and sashimi, which often contain raw fish.  Unpasteurized beverages, such as unpasteurized fruit juices, raw milk, raw yogurt, or cider Undercooked eggs, such as soft boiled, over easy, and poached; raw, unpasteurized eggs; or foods made with raw egg, such as homemade raw cookie dough and homemade mayonnaise   Simple steps for food safety   Shop smart. Do not buy food stored or displayed in an unclean area. Do not buy bruised or damaged fruits or vegetables. Do not buy cans that have cracks, dents, or  bulges. Pick up foods that can spoil at the end of your shopping trip and store them in a cooler on the way home.   Prepare and clean up foods carefully. Rinse all fresh fruits and vegetables under running water, and dry them with a clean towel or paper towel. Clean the top of cans before opening them. After preparing food, wash your hands for 20 seconds with hot water and soap. Pay special attention to areas between fingers and under nails. Clean your utensils and dishes with hot water and soap. Disinfect your kitchen and cutting boards using 1 teaspoon of liquid, unscented bleach mixed into 1 quart of water.     Dispose of old food. Eat canned and packaged food before its expiration date (the "use by" or "best before" date). Consume refrigerated leftovers within 3 to 4 days. After that time, throw out the food. Even if the food does not smell or look spoiled, it still may be unsafe.  Some bacteria, such as Listeria, can grow even on foods stored in the refrigerator if they are kept for too long.   Take precautions when eating out. At restaurants, avoid buffets and salad bars where food sits out for a long time and comes in contact with many people. Food can become contaminated when someone with a virus, often a norovirus, or another "bug" handles it. Put any leftover food in a "to-go" container yourself, rather than having the server do it. And, refrigerate leftovers as soon as you get home. Choose restaurants that are clean and that are willing to prepare your food as you order it cooked.     AT HOME MEDICATIONS:                                                                                                                                                                 Compazine /Prochlorperazine  10mg  tablet. Take 1 tablet every 6 hours as needed for nausea/vomiting. (This can make you sleepy)     EMLA cream. Apply a quarter size amount to port site 1 hour prior to chemo. Do not rub in.  Cover with plastic wrap.       Diarrhea Sheet    If you are having loose stools/diarrhea, please purchase Imodium and begin taking as outlined:  At the first sign of poorly formed or loose stools you should begin taking Imodium (loperamide) 2 mg capsules.  Take two tablets (4mg ) followed by one tablet (2mg ) every 2 hours - DO NOT EXCEED 8 tablets in 24 hours.  If it is bedtime and you are having loose stools, take 2 tablets at bedtime, then 2 tablets every 4 hours until morning.    Always call the Cancer Center if you are having loose stools/diarrhea that you can't get under control.  Loose stools/diarrhea leads to dehydration (loss of water) in your body.  We have other options of trying to get the loose stools/diarrhea to stop but you must let us  know!     Constipation Sheet   Colace - 100 mg capsules - take 2 capsules daily.  If this doesn't help then you can increase to 2 capsules twice daily.  Please call if the above does not work for you. Do not go more than 2 days without a bowel movement.  It is very important that you do not become constipated.  It will make you feel sick to your stomach (nausea) and can cause abdominal pain and vomiting.   Nausea Sheet    Compazine /Prochlorperazine  10mg  tablet. Take 1 tablet every 6 hours as needed for nausea/vomiting (This can make you drowsy).   If you are having persistent nausea (nausea that does not stop) please call the Cancer Center and let us  know the amount of nausea  that you are experiencing.  If you begin to vomit, you need to call the Cancer Center and if it is the weekend and you have vomited more than one time and can't get it to stop-go to the Emergency Room.  Persistent nausea/vomiting can lead to dehydration (loss of fluid in your body) and will make you feel very weak and unwell. Ice chips, sips of clear liquids, foods that are at room temperature, crackers, and toast tend to be better tolerated.     SYMPTOMS TO REPORT AS SOON AS  POSSIBLE AFTER TREATMENT:   FEVER GREATER THAN 100.4 F   CHILLS WITH OR WITHOUT FEVER   NAUSEA AND VOMITING THAT IS NOT CONTROLLED WITH YOUR NAUSEA MEDICATION   UNUSUAL SHORTNESS OF BREATH   UNUSUAL BRUISING OR BLEEDING   TENDERNESS IN MOUTH AND THROAT WITH OR WITHOUT PRESENCE OF ULCERS   URINARY PROBLEMS   BOWEL PROBLEMS   UNUSUAL RASH         Wear comfortable clothing and clothing appropriate for easy access to any Portacath or PICC line. Let us  know if there is anything that we can do to make your therapy better!       What to do if you need assistance after hours or on the weekends: CALL [phone].  HOLD on the line, do not hang up.  You will hear multiple messages but at the end you will be connected with a nurse triage line.  They will contact the doctor if necessary.  Most of the time they will be able to assist you.  Do not call the hospital operator.         I have been informed and understand all of the instructions given to me and have received a copy. I have been instructed to call the clinic [phone] or my family physician as soon as possible for continued medical care, if indicated. I do not have any more questions at this time but understand that I may call the Cancer Center or the Patient Navigator at [phone] during office hours should I have questions or need assistance in obtaining follow-up care.

## 2024-05-31 NOTE — Progress Notes (Signed)
 START OFF PATHWAY REGIMEN - Other   [id number]:Azacitidine 75 mg/m2 IV D1-7 q28 Days:   A cycle is every 28 days:     Azacitidine   **Always confirm dose/schedule in your pharmacy ordering system**  Clinician Citation:   Patient Characteristics: Intent of Therapy: Non-Curative / Palliative Intent, Discussed with Patient

## 2024-05-31 NOTE — Progress Notes (Signed)
 Pt presents to cancer center for blood transfusion. Pt tolerated RBC transfusion well with no signs of complications. Blood transfusion given per protocol. Vitals stable pre, during, and post transfusion. RN educated pt on the importance of notifying the clinic if any complications occur and when to seek emergency care. Pt verbalized understanding and all questions answered at this time. All follow ups as scheduled. All follow ups as scheduled.   Laurie Wallace

## 2024-05-31 NOTE — Progress Notes (Signed)
 Pharmacist Chemotherapy Monitoring - Initial Assessment    Anticipated start date: 06/05/24   The following has been reviewed per standard work regarding the patient's treatment regimen: The patient's diagnosis, treatment plan and drug doses, and organ/hematologic function Lab orders and baseline tests specific to treatment regimen  The treatment plan start date, drug sequencing, and pre-medications Prior authorization status  Patient's documented medication list, including drug-drug interaction screen and prescriptions for anti-emetics and supportive care specific to the treatment regimen The drug concentrations, fluid compatibility, administration routes, and timing of the medications to be used The patient's access for treatment and lifetime cumulative dose history, if applicable  The patient's medication allergies and previous infusion related reactions, if applicable   Changes made to treatment plan:  treatment plan date and updated Zofran  to IV push.  Follow up needed:  N/A   Laurie Wallace, [facility], 05/31/2024  11:17 AM

## 2024-06-01 ENCOUNTER — Other Ambulatory Visit: Payer: Self-pay

## 2024-06-01 LAB — BPAM RBC
Blood Product Expiration Date: 202512222359
ISSUE DATE / TIME: 202511260924
Unit Type and Rh: 5100

## 2024-06-01 LAB — TYPE AND SCREEN
ABO/RH(D): O POS
Antibody Screen: NEGATIVE
Unit division: 0

## 2024-06-04 ENCOUNTER — Other Ambulatory Visit: Payer: Self-pay | Admitting: Oncology

## 2024-06-04 DIAGNOSIS — C9310 Chronic myelomonocytic leukemia not having achieved remission: Secondary | ICD-10-CM

## 2024-06-05 ENCOUNTER — Inpatient Hospital Stay

## 2024-06-05 ENCOUNTER — Encounter: Payer: Self-pay | Admitting: Oncology

## 2024-06-05 ENCOUNTER — Inpatient Hospital Stay: Attending: Oncology

## 2024-06-05 DIAGNOSIS — Z79899 Other long term (current) drug therapy: Secondary | ICD-10-CM | POA: Diagnosis not present

## 2024-06-05 DIAGNOSIS — C9310 Chronic myelomonocytic leukemia not having achieved remission: Secondary | ICD-10-CM | POA: Diagnosis present

## 2024-06-05 DIAGNOSIS — Z5111 Encounter for antineoplastic chemotherapy: Secondary | ICD-10-CM | POA: Diagnosis present

## 2024-06-05 DIAGNOSIS — D75839 Thrombocytosis, unspecified: Secondary | ICD-10-CM | POA: Insufficient documentation

## 2024-06-05 LAB — CBC WITH DIFFERENTIAL/PLATELET
Abs Immature Granulocytes: 4.8 K/uL — ABNORMAL HIGH (ref 0.00–0.07)
Basophils Absolute: 0 K/uL (ref 0.0–0.1)
Basophils Relative: 0 %
Eosinophils Absolute: 2.4 K/uL — ABNORMAL HIGH (ref 0.0–0.5)
Eosinophils Relative: 5 %
HCT: 27.3 % — ABNORMAL LOW (ref 36.0–46.0)
Hemoglobin: 8.7 g/dL — ABNORMAL LOW (ref 12.0–15.0)
Lymphocytes Relative: 21 %
Lymphs Abs: 10.2 K/uL — ABNORMAL HIGH (ref 0.7–4.0)
MCH: 31.3 pg (ref 26.0–34.0)
MCHC: 31.9 g/dL (ref 30.0–36.0)
MCV: 98.2 fL (ref 80.0–100.0)
Metamyelocytes Relative: 5 %
Monocytes Absolute: 16.9 K/uL — ABNORMAL HIGH (ref 0.1–1.0)
Monocytes Relative: 35 %
Myelocytes: 4 %
Neutro Abs: 13.1 K/uL — ABNORMAL HIGH (ref 1.7–7.7)
Neutrophils Relative %: 27 %
Other: 2 %
Platelets: 845 K/uL — ABNORMAL HIGH (ref 150–400)
Promyelocytes Relative: 1 %
RBC: 2.78 MIL/uL — ABNORMAL LOW (ref 3.87–5.11)
RDW: 19.7 % — ABNORMAL HIGH (ref 11.5–15.5)
WBC: 48.4 K/uL — ABNORMAL HIGH (ref 4.0–10.5)
nRBC: 0.1 % (ref 0.0–0.2)

## 2024-06-05 LAB — COMPREHENSIVE METABOLIC PANEL WITH GFR
ALT: 10 U/L (ref 0–44)
AST: 26 U/L (ref 15–41)
Albumin: 4.4 g/dL (ref 3.5–5.0)
Alkaline Phosphatase: 100 U/L (ref 38–126)
Anion gap: 11 (ref 5–15)
BUN: 8 mg/dL (ref 8–23)
CO2: 23 mmol/L (ref 22–32)
Calcium: 9.1 mg/dL (ref 8.9–10.3)
Chloride: 106 mmol/L (ref 98–111)
Creatinine, Ser: 1.13 mg/dL — ABNORMAL HIGH (ref 0.44–1.00)
GFR, Estimated: 50 mL/min — ABNORMAL LOW (ref >60)
Glucose, Bld: 84 mg/dL (ref 70–99)
Potassium: 4.1 mmol/L (ref 3.5–5.1)
Sodium: 140 mmol/L (ref 135–145)
Total Bilirubin: 0.3 mg/dL (ref 0.0–1.2)
Total Protein: 6.8 g/dL (ref 6.5–8.1)

## 2024-06-05 LAB — TYPE AND SCREEN
ABO/RH(D): O POS
Antibody Screen: NEGATIVE

## 2024-06-05 LAB — MAGNESIUM: Magnesium: 2.2 mg/dL (ref 1.7–2.4)

## 2024-06-06 ENCOUNTER — Ambulatory Visit: Admitting: Internal Medicine

## 2024-06-06 ENCOUNTER — Inpatient Hospital Stay

## 2024-06-06 ENCOUNTER — Telehealth: Payer: Self-pay | Admitting: Internal Medicine

## 2024-06-06 ENCOUNTER — Telehealth: Payer: Self-pay

## 2024-06-06 ENCOUNTER — Other Ambulatory Visit: Payer: Self-pay | Admitting: Oncology

## 2024-06-06 ENCOUNTER — Encounter: Payer: Self-pay | Admitting: Internal Medicine

## 2024-06-06 VITALS — BP 145/70 | HR 90 | Ht 66.0 in | Wt 120.0 lb

## 2024-06-06 DIAGNOSIS — R058 Other specified cough: Secondary | ICD-10-CM | POA: Insufficient documentation

## 2024-06-06 DIAGNOSIS — C9310 Chronic myelomonocytic leukemia not having achieved remission: Secondary | ICD-10-CM

## 2024-06-06 LAB — PATHOLOGIST SMEAR REVIEW

## 2024-06-06 NOTE — Patient Instructions (Signed)
 Chrystie programmer, applications (but not the white ones)  Pulmonary follow up is as needed for cough or breathing problems

## 2024-06-06 NOTE — Telephone Encounter (Signed)
 Copied from CRM [id number]. Topic: General - Other >> Jun 06, 2024  2:39 PM Drema MATSU wrote: Reason for CRM: Relayed pcp message to [place] regarding Hydroxyzine .

## 2024-06-06 NOTE — Transitions of Care (Post Inpatient/ED Visit) (Signed)
 06/06/2024  Patient ID: Laurie Wallace, female   DOB: 1945/09/27, 78 y.o.   MRN: [id number] Spoke with niece, [name] on [place] regarding ongoing follow up and states patient has multiple appointments and treatments with no further follow up needs telephonically.  Will close TOC 30 day program and did not wish for CCM longitudinal follow up. Encouraged to reach out if these needs changes. Verbalized appreciation.  Richerd Fish, RN, BSN, CCM [facility], [facility] Management Coordinator Direct Dial: [phone]

## 2024-06-06 NOTE — Telephone Encounter (Signed)
 Spoke with pt . She will start medication today

## 2024-06-06 NOTE — Telephone Encounter (Signed)
 Attempted to reach pt no vm set up

## 2024-06-06 NOTE — Assessment & Plan Note (Addendum)
 Never smoker - CT sinus 10/31/23 Moderate to advanced mucosal thickening about the frontoethmoidal and visualized maxillary sinuses with a few superimposed air-fluid levels > rx augmentin   - 06/06/2024   Walked on RA  x  3  lap(s) =  approx 450  ft  @ modpace, stopped due to end of study  with lowest 02 sats 95% and no sob cough or cp     The pattern of her cough is daytime not productive and never noct so likely started with a sinus infection and now is self perpetuated with throat clearing but not likely to be related to the micoscopic findings in chest ct 05/11/24   Rec >>> Use of non-mint / menthol lozenges to suppress thraot clearing   >>> low theshold to repeat CT sinus and maybe cxr but would not follow the findings with serial Chest ct unless there is a major change in her symptoms given the likelihood here we'd just be chasing inflammatory findings  at high cost and radiation exposure with no benefit of detecting early dz  Discussed in detail all the  indications, usual  risks and alternatives  relative to the benefits with patient who agrees to proceed with w/u as outlined.     Pulmonary f/u is prn   Each maintenance medication was reviewed in detail including emphasizing most importantly the difference between maintenance and prns and under what circumstances the prns are to be triggered using an action plan format where appropriate.  Total time for H and P, chart review, counseling,  directly observing portions of ambulatory 02 saturation study/ and generating customized AVS unique to this office visit / same day charting = 47 min new complex pt eval.

## 2024-06-06 NOTE — Telephone Encounter (Signed)
 Pt requesting meds for her nerves.

## 2024-06-06 NOTE — Progress Notes (Signed)
 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    Admit date:     04/28/2024  Discharge date: 05/09/24  Discharge Physician: Alm Tat    PCP: [name], MD    Recommendations at discharge:   Please follow up with primary care provider within 1-2 weeks  Please repeat BMP and CBC in one week           Hospital Course:  78 y.o. 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   Assessment and Plan:   Leukocytosis and thrombocytosis -Concern for CML versus CMML -WBC peaked at 123K  and platelets up to 1386, discussed with Dr. Davonna with heme/onc and consulting  -Pt says she has missed appts and has not established care with outpatient oncologist - 05/03/24--IR bone marrow bx - discussed with Dr. Davonna and pt started on hydroxyurea  500 mg BID on 05/01/24 -05/03/24 discussed with Dr. Delma d/c until we get some prelim results from bone marrow bx although does not look like acute leukemic conversion right now based on peripheral blood work - 05/03/24 CBC--2%  blasts -05/04/24 WBC--64.9, no blasts -05/05/24 WBC--47.7 - 05/06/24 WBC 38.5, 8 % blasts - 05/08/24 WBC 35.1, 6% blasts - 05/08/24--discussed with pathology, Dr. 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; not currently in acute myeloid leukemic conversion - 05/08/24--also discussed with hematology, Delon Burns>>they will set up follow up in clinic within one week -05/09/24 WBC 26.2, 4% blasts   Macrocytic anemia -- B12 (1469), folate (>20) --10/31 transfused one unit PRBC   Presumed cancer related pain -- scheduled acetaminophen  around the clock  - overall improving;  ambulating hallways   Fever - suspect neoplastic fever - follow and treat supportively - blood cultures neg to date - PCT 0.23>>0.48 - UA 21-50 WBC - follow urine culture--neg -10/27 CXR--no consolidation - PCT 0.23>>0.48 - overall improved    Generalized weakness and deconditioning -TSH 1.430 -PT eval>>SNF -B12 1469 -folate >20 -CK--27 -05/09/24--had peer-to-peer review with her insurance>>denied SNF;  setting up HHPT    History of pulmonary embolism -09/29/23 CTA chest--acute bilateral lower lobe segmental and subsegmental pulmonary emboli -Continue apixaban  5 mg BID  -history of recent aortic thrombosis status post TEVAR by Dr. Magda on 04/12/2024    COPD No wheezing Bronchodilators PRN    Rheumatoid arthritis Continue with methotrexate  15 mg weekly (Tuesdays)         History of Present Illness  06/06/2024  Pulmonary/ 1st office eval/ [facility] / [facility] on Methotrexate / no resp rx  Chief Complaint  Patient presents with   Establish Care  Abn CT referred by Tobie   Coughing x1 year   Dyspnea:  limited by legs (fatigue)  more than breathing at this point Cough: sporadic non-productive daytim only  assoc with sensation of pnds starts up well p stirring in am with urge to clear throat  Sleep: flat bed / 2 pillows does fine noct  SABA use: none  02: none     No  obvious day to day or daytime pattern/variability or assoc excess/ purulent sputum or mucus plugs or hemoptysis or cp or chest tightness, subjective wheeze or overt  hb symptoms.    Also denies any obvious fluctuation of symptoms with weather or environmental changes or other aggravating or alleviating factors except as outlined above   No unusual exposure hx or h/o childhood pna/ asthma or knowledge of premature birth.  Current Allergies, Complete Past Medical History, Past Surgical History, Family History, and Social History were reviewed in [employer] record.  ROS  The following are not active complaints unless bolded Hoarseness, sore throat, dysphagia, dental problems, itching, sneezing,  nasal congestion or discharge of excess mucus or purulent secretions, ear ache,   fever, chills, sweats, unintended wt loss or wt gain, classically pleuritic or exertional cp,  orthopnea pnd or arm/hand swelling  or leg swelling, presyncope, palpitations, abdominal pain, anorexia, nausea, vomiting, diarrhea  or change in bowel habits or change in bladder habits, change in stools or change in urine, dysuria, hematuria,  rash, arthralgias, visual complaints, headache, numbness, weakness or ataxia or problems with walking or coordination,  change in mood or  memory.            Outpatient Medications Prior to Visit  Medication Sig Dispense Refill   allopurinol  (ZYLOPRIM ) 100 MG tablet Take 1 tablet (100 mg total) by mouth daily. 30 tablet 1   aspirin  EC 81 MG tablet Take 81 mg by mouth daily. Swallow whole.     azaCITIDine 5 mg/2 mLs in lactated ringers  infusion Inject into the vein every 28 (twenty-eight) days.     ELIQUIS  5 MG TABS tablet Take 1 tablet (5 mg total) by mouth 2 (two) times daily. 60 tablet 3   famotidine  (PEPCID ) 40 MG tablet TAKE ONE TABLET BY MOUTH DAILY 90 tablet 1   folic acid  (FOLVITE ) 1 MG tablet Take 1 tablet (1 mg total) by mouth daily. 90 tablet 3   hydrOXYzine   (ATARAX ) 10 MG tablet Take 1 tablet (10 mg total) by mouth 2 (two) times daily as needed for anxiety. 30 tablet 2   ondansetron  (ZOFRAN ) 4 MG tablet Take 1 tablet (4 mg total) by mouth every 8 (eight) hours as needed for nausea or vomiting. 20 tablet 0   levocetirizine (XYZAL ) 5 MG tablet Take 1 tablet (5 mg total) by mouth every evening. 30 tablet 5   methotrexate  (RHEUMATREX) 2.5 MG tablet Take 6 tablets (15 mg total) by mouth once a week. Caution:Chemotherapy. Protect from light. (Patient not taking: Reported on 06/06/2024) 72 tablet 0   No facility-administered medications prior to visit.    Past Medical History:  Diagnosis Date   Allergy    Anemia    Iron  Deficiency   Arthritis    Rheumatoid Arthritis   Family history of adverse reaction to anesthesia    Sister has N/V   GERD (gastroesophageal reflux disease)    Peripheral vascular disease    Pneumonia       Objective:     BP (!) 145/70   Pulse 90  Ht 5' 6 (1.676 m)   Wt 120 lb (54.4 kg)   SpO2 96% Comment: ra  BMI 19.37 kg/m   SpO2: 96 % (ra)  amb somber bf nad    HEENT : Oropharynx  nl / oropharynx pristine      Nasal turbinates nl    NECK :  without  apparent JVD/ palpable Nodes/TM    LUNGS: no acc muscle use,  Nl contour chest which is clear to A and P bilaterally without cough on insp or exp maneuvers   CV:  RRR  no s3 or murmur or increase in P2, and no edema   ABD:  soft and nontender   MS:  Gait nl   ext warm without deformities Or obvious joint restrictions  calf tenderness, cyanosis or clubbing    SKIN: warm and dry without lesions    NEURO:  alert, approp, nl sensorium with  no motor or cerebellar deficits apparent.       I personally reviewed images and agree with radiology impression as follows:   Chest CTa   05/11/24  A tubular shaped parenchymal nodule is present in the right lower lobe which is favored to represent sequelae of mucous impaction or be infectious/inflammatory. This is  similar when compared to March 31, 2024.      Assessment   Assessment & Plan Upper airway cough syndrome Never smoker - CT sinus 10/31/23 Moderate to advanced mucosal thickening about the frontoethmoidal and visualized maxillary sinuses with a few superimposed air-fluid levels > rx augmentin   - 06/06/2024   Walked on RA  x  3  lap(s) =  approx 450  ft  @ modpace, stopped due to end of study  with lowest 02 sats 95% and no sob cough or cp     The pattern of her cough is daytime not productive and never noct so likely started with a sinus infection and now is self perpetuated with throat clearing but not likely to be related to the micoscopic findings in chest ct 05/11/24   Rec >>> Use of non-mint / menthol lozenges to suppress thraot clearing   >>> low theshold to repeat CT sinus and maybe cxr but would not follow the findings with serial Chest ct unless there is a major change in her symptoms given the likelihood here we'd just be chasing inflammatory findings  at high cost and radiation exposure with no benefit of detecting early dz  Discussed in detail all the  indications, usual  risks and alternatives  relative to the benefits with patient who agrees to proceed with w/u as outlined.     Pulmonary f/u is prn   Each maintenance medication was reviewed in detail including emphasizing most importantly the difference between maintenance and prns and under what circumstances the prns are to be triggered using an action plan format where appropriate.  Total time for H and P, chart review, counseling,  directly observing portions of ambulatory 02 saturation study/ and generating customized AVS unique to this office visit / same day charting = 47 min new complex pt eval.                     AVS  Patient Instructions  Chrystie ranchers and lifesavers (but not the white ones)  Pulmonary follow up is as needed for cough or breathing problems         Ozell America, MD 06/06/2024

## 2024-06-06 NOTE — Telephone Encounter (Signed)
 This encounter was created in error - please disregard.

## 2024-06-07 ENCOUNTER — Inpatient Hospital Stay

## 2024-06-07 ENCOUNTER — Other Ambulatory Visit: Payer: Self-pay | Admitting: *Deleted

## 2024-06-07 ENCOUNTER — Other Ambulatory Visit: Payer: Self-pay

## 2024-06-07 ENCOUNTER — Inpatient Hospital Stay: Admitting: Licensed Clinical Social Worker

## 2024-06-07 ENCOUNTER — Other Ambulatory Visit: Payer: Self-pay | Admitting: Internal Medicine

## 2024-06-07 DIAGNOSIS — I2699 Other pulmonary embolism without acute cor pulmonale: Secondary | ICD-10-CM

## 2024-06-07 DIAGNOSIS — C9310 Chronic myelomonocytic leukemia not having achieved remission: Secondary | ICD-10-CM

## 2024-06-07 DIAGNOSIS — J309 Allergic rhinitis, unspecified: Secondary | ICD-10-CM

## 2024-06-07 MED ORDER — APIXABAN 5 MG PO TABS: 5.0000 mg | ORAL_TABLET | Freq: Two times a day (BID) | ORAL | 3 refills | Status: AC

## 2024-06-07 NOTE — Progress Notes (Signed)
 [facility] Clinical Social Work  Initial Assessment   Laurie Wallace is a 78 y.o. year old female contacted by phone. Clinical Social Work was referred by medical provider for assessment of psychosocial needs.  Details for assessment provided by pt's niece, Jenkins.  SDOH (Social Determinants of Health) assessments performed: Yes SDOH Interventions    Flowsheet Row Telephone from 05/11/2024 in [place] [facility] DEPARTMENT Telephone from 04/17/2024 in [place] POPULATION HE[facility] DEPARTMENT Clinical Support from 06/14/2023 in [facility] Telephone from 05/15/2022 in [facility] Community Care Coordination Telephone from 05/14/2022 in [facility] Community Care Coordination Clinical Support from 05/07/2021 in [facility]  SDOH Interventions        Food Insecurity Interventions Intervention Not Indicated Intervention Not Indicated Intervention Not Indicated Other (Comment)  [facility] food pantry list.] Intervention Not Indicated, Other (Comment)  [employer] Resource Care Guide referral placed for provision of resources for food acquisition] Intervention Not Indicated  Housing Interventions Intervention Not Indicated Intervention Not Indicated Intervention Not Indicated -- -- Intervention Not Indicated  Transportation Interventions Intervention Not Indicated Intervention Not Indicated Intervention Not Indicated -- Intervention Not Indicated  [drives self] Intervention Not Indicated  Utilities Interventions Intervention Not Indicated Intervention Not Indicated Intervention Not Indicated -- -- --  Alcohol Usage Interventions -- -- Intervention Not Indicated (Score <7) -- -- --  Financial Strain Interventions -- -- Intervention Not Indicated -- -- Intervention Not Indicated  Physical Activity Interventions -- -- Intervention Not Indicated -- -- Intervention Not Indicated  Stress Interventions -- --  Intervention Not Indicated -- -- Intervention Not Indicated  Social Connections Interventions -- -- Intervention Not Indicated -- -- Patient Refused  Health Literacy Interventions -- -- Intervention Not Indicated -- -- --    SDOH Screenings   Food Insecurity: No Food Insecurity (05/19/2024)  Housing: Low Risk  (05/19/2024)  Transportation Needs: No Transportation Needs (05/19/2024)  Utilities: Not At Risk (05/19/2024)  Alcohol Screen: Low Risk  (06/14/2023)  Depression (PHQ2-9): Low Risk  (05/31/2024)  Financial Resource Strain: Low Risk  (06/14/2023)  Physical Activity: Sufficiently Active (06/14/2023)  Social Connections: Moderately Isolated (04/28/2024)  Stress: No Stress Concern Present (06/14/2023)  Tobacco Use: Low Risk  (06/06/2024)  Health Literacy: Adequate Health Literacy (06/14/2023)    PHQ 2/9:    05/31/2024    9:00 AM 05/23/2024   10:59 AM 05/19/2024    8:35 AM  Depression screen PHQ 2/9  Decreased Interest 0 0 0  Down, Depressed, Hopeless 0 0 0  PHQ - 2 Score 0 0 0  Altered sleeping  0   Tired, decreased energy  0   Change in appetite  0   Feeling bad or failure about yourself   0   Trouble concentrating  0   Moving slowly or fidgety/restless  0   Suicidal thoughts  0   PHQ-9 Score  0   Difficult doing work/chores  Not difficult at all      Distress Screen completed: No     No data to display            Family/Social Information:  Housing Arrangement: patient lives alone; however, since hospitalization pt's sister has been staying w/ pt through the week and pt's son is staying w/ pt over the weekend.  Pt reportedly values her independence and it has been challenging for her to ask for assistance from family. Family members/support persons in your life? Pt has family close by that  assist as needed. 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  Income source: Actor  concerns: No Type of concern: None Food access concerns: no Religious or spiritual practice: Not known Advanced directives: Not known Services Currently in place:  home care is in place since discharge from hospital  Coping/ Adjustment to diagnosis: Patient understands treatment plan and what happens next? yes Concerns about diagnosis and/or treatment: Overwhelmed by information, How will I care for myself, and Quality of life Patient reported stressors: Adjusting to my illness and Physical issues Hopes and/or priorities: pt's priority is to continue treatment w/ the hope of positive results Patient enjoys time with family/ friends Current coping skills/ strengths: Motivation for treatment/growth  and Supportive family/friends     SUMMARY: Current SDOH Barriers:  No barriers identified at this time.  Clinical Social Work Clinical Goal(s):  No clinical social work goals at this time  Interventions: Discussed common feeling and emotions when being diagnosed with cancer, and the importance of support during treatment Informed patient of the support team roles and support services at [facility] Provided CSW contact information and encouraged patient to call with any questions or concerns    Follow Up Plan: Patient will contact CSW with any support or resource needs Patient verbalizes understanding of plan: Yes    Devere JONELLE Manna, LCSW Clinical Social Worker [facility]

## 2024-06-08 ENCOUNTER — Inpatient Hospital Stay

## 2024-06-08 DIAGNOSIS — C9310 Chronic myelomonocytic leukemia not having achieved remission: Secondary | ICD-10-CM

## 2024-06-08 DIAGNOSIS — Z5111 Encounter for antineoplastic chemotherapy: Secondary | ICD-10-CM | POA: Diagnosis not present

## 2024-06-08 LAB — CBC WITH DIFFERENTIAL/PLATELET
Abs Immature Granulocytes: 4.8 K/uL — ABNORMAL HIGH (ref 0.00–0.07)
Basophils Absolute: 0.2 K/uL — ABNORMAL HIGH (ref 0.0–0.1)
Basophils Relative: 1 %
Eosinophils Absolute: 1.9 K/uL — ABNORMAL HIGH (ref 0.0–0.5)
Eosinophils Relative: 6 %
HCT: 28.6 % — ABNORMAL LOW (ref 36.0–46.0)
Hemoglobin: 9 g/dL — ABNORMAL LOW (ref 12.0–15.0)
Immature Granulocytes: 15 %
Lymphocytes Relative: 18 %
Lymphs Abs: 5.8 K/uL — ABNORMAL HIGH (ref 0.7–4.0)
MCH: 31.1 pg (ref 26.0–34.0)
MCHC: 31.5 g/dL (ref 30.0–36.0)
MCV: 99 fL (ref 80.0–100.0)
Monocytes Absolute: 4.2 K/uL — ABNORMAL HIGH (ref 0.1–1.0)
Monocytes Relative: 13 %
Neutro Abs: 16.2 K/uL — ABNORMAL HIGH (ref 1.7–7.7)
Neutrophils Relative %: 47 %
Platelets: 672 K/uL — ABNORMAL HIGH (ref 150–400)
RBC: 2.89 MIL/uL — ABNORMAL LOW (ref 3.87–5.11)
RDW: 20.6 % — ABNORMAL HIGH (ref 11.5–15.5)
WBC: 33.1 K/uL — ABNORMAL HIGH (ref 4.0–10.5)
nRBC: 0.2 % (ref 0.0–0.2)

## 2024-06-08 LAB — COMPREHENSIVE METABOLIC PANEL WITH GFR
ALT: 16 U/L (ref 0–44)
AST: 28 U/L (ref 15–41)
Albumin: 4.2 g/dL (ref 3.5–5.0)
Alkaline Phosphatase: 100 U/L (ref 38–126)
Anion gap: 12 (ref 5–15)
BUN: 11 mg/dL (ref 8–23)
CO2: 22 mmol/L (ref 22–32)
Calcium: 9 mg/dL (ref 8.9–10.3)
Chloride: 104 mmol/L (ref 98–111)
Creatinine, Ser: 1.12 mg/dL — ABNORMAL HIGH (ref 0.44–1.00)
GFR, Estimated: 50 mL/min — ABNORMAL LOW (ref >60)
Glucose, Bld: 103 mg/dL — ABNORMAL HIGH (ref 70–99)
Potassium: 4.8 mmol/L (ref 3.5–5.1)
Sodium: 138 mmol/L (ref 135–145)
Total Bilirubin: 0.4 mg/dL (ref 0.0–1.2)
Total Protein: 6.8 g/dL (ref 6.5–8.1)

## 2024-06-08 LAB — MAGNESIUM: Magnesium: 2.4 mg/dL (ref 1.7–2.4)

## 2024-06-09 ENCOUNTER — Inpatient Hospital Stay

## 2024-06-12 ENCOUNTER — Inpatient Hospital Stay

## 2024-06-12 ENCOUNTER — Encounter: Payer: Self-pay | Admitting: Oncology

## 2024-06-13 ENCOUNTER — Inpatient Hospital Stay

## 2024-06-13 ENCOUNTER — Other Ambulatory Visit: Payer: Self-pay

## 2024-06-13 VITALS — BP 109/42 | HR 77 | Temp 98.2°F | Resp 16 | Wt 117.3 lb

## 2024-06-13 DIAGNOSIS — Z5111 Encounter for antineoplastic chemotherapy: Secondary | ICD-10-CM | POA: Diagnosis not present

## 2024-06-13 DIAGNOSIS — C9310 Chronic myelomonocytic leukemia not having achieved remission: Secondary | ICD-10-CM

## 2024-06-13 DIAGNOSIS — I2699 Other pulmonary embolism without acute cor pulmonale: Secondary | ICD-10-CM

## 2024-06-13 LAB — CBC WITH DIFFERENTIAL/PLATELET
Abs Immature Granulocytes: 10.7 K/uL — ABNORMAL HIGH (ref 0.00–0.07)
Band Neutrophils: 4 %
Basophils Absolute: 0 K/uL (ref 0.0–0.1)
Basophils Relative: 0 %
Blasts: 5 %
Eosinophils Absolute: 0 K/uL (ref 0.0–0.5)
Eosinophils Relative: 0 %
HCT: 29 % — ABNORMAL LOW (ref 36.0–46.0)
Hemoglobin: 9 g/dL — ABNORMAL LOW (ref 12.0–15.0)
Lymphocytes Relative: 22 %
Lymphs Abs: 8.4 K/uL — ABNORMAL HIGH (ref 0.7–4.0)
MCH: 31.3 pg (ref 26.0–34.0)
MCHC: 31 g/dL (ref 30.0–36.0)
MCV: 100.7 fL — ABNORMAL HIGH (ref 80.0–100.0)
Metamyelocytes Relative: 9 %
Monocytes Absolute: 6.5 K/uL — ABNORMAL HIGH (ref 0.1–1.0)
Monocytes Relative: 17 %
Myelocytes: 5 %
Neutro Abs: 10.7 K/uL — ABNORMAL HIGH (ref 1.7–7.7)
Neutrophils Relative %: 24 %
Platelets: 635 K/uL — ABNORMAL HIGH (ref 150–400)
Promyelocytes Relative: 14 %
RBC: 2.88 MIL/uL — ABNORMAL LOW (ref 3.87–5.11)
RDW: 21.2 % — ABNORMAL HIGH (ref 11.5–15.5)
WBC: 38.3 K/uL — ABNORMAL HIGH (ref 4.0–10.5)
nRBC: 0.1 % (ref 0.0–0.2)

## 2024-06-13 LAB — COMPREHENSIVE METABOLIC PANEL WITH GFR
ALT: 16 U/L (ref 0–44)
AST: 27 U/L (ref 15–41)
Albumin: 4.4 g/dL (ref 3.5–5.0)
Alkaline Phosphatase: 100 U/L (ref 38–126)
Anion gap: 13 (ref 5–15)
BUN: 8 mg/dL (ref 8–23)
CO2: 21 mmol/L — ABNORMAL LOW (ref 22–32)
Calcium: 9.2 mg/dL (ref 8.9–10.3)
Chloride: 108 mmol/L (ref 98–111)
Creatinine, Ser: 1.01 mg/dL — ABNORMAL HIGH (ref 0.44–1.00)
GFR, Estimated: 57 mL/min — ABNORMAL LOW (ref >60)
Glucose, Bld: 86 mg/dL (ref 70–99)
Potassium: 4.1 mmol/L (ref 3.5–5.1)
Sodium: 142 mmol/L (ref 135–145)
Total Bilirubin: 0.4 mg/dL (ref 0.0–1.2)
Total Protein: 7 g/dL (ref 6.5–8.1)

## 2024-06-13 LAB — SAMPLE TO BLOOD BANK

## 2024-06-13 LAB — MAGNESIUM: Magnesium: 2.3 mg/dL (ref 1.7–2.4)

## 2024-06-13 MED ORDER — SODIUM CHLORIDE 0.9 % IV SOLN
75.0000 mg/m2 | Freq: Once | INTRAVENOUS | Status: AC
  Administered 2024-06-13: 116 mg via INTRAVENOUS
  Filled 2024-06-13: qty 11.6

## 2024-06-13 MED ORDER — LIDOCAINE-PRILOCAINE 2.5-2.5 % EX CREA: TOPICAL_CREAM | CUTANEOUS | 3 refills | Status: DC

## 2024-06-13 MED ORDER — ONDANSETRON HCL 8 MG PO TABS: 8.0000 mg | ORAL_TABLET | Freq: Three times a day (TID) | ORAL | 1 refills | Status: AC | PRN

## 2024-06-13 MED ORDER — PROCHLORPERAZINE MALEATE 10 MG PO TABS: 10.0000 mg | ORAL_TABLET | Freq: Four times a day (QID) | ORAL | 1 refills | Status: AC | PRN

## 2024-06-13 MED ORDER — ONDANSETRON HCL 4 MG/2ML IJ SOLN
8.0000 mg | Freq: Once | INTRAMUSCULAR | Status: AC
  Administered 2024-06-13: 8 mg via INTRAVENOUS
  Filled 2024-06-13: qty 4

## 2024-06-13 MED ORDER — SODIUM CHLORIDE 0.9 % IV SOLN: INTRAVENOUS | Status: DC

## 2024-06-13 NOTE — Patient Instructions (Signed)
 [facility] [place] - A DEPT OF [facility]  Discharge Instructions: Thank you for choosing [facility] to provide your oncology and hematology care.  If you have a lab appointment with the Cancer Center - please note that after April 8th, 2024, all labs will be drawn in the cancer center.  You do not have to check in or register with the main entrance as you have in the past but will complete your check-in in the cancer center.  Wear comfortable clothing and clothing appropriate for easy access to any Portacath or PICC line.   We strive to give you quality time with your provider. You may need to reschedule your appointment if you arrive late (15 or more minutes).  Arriving late affects you and other patients whose appointments are after yours.  Also, if you miss three or more appointments without notifying the office, you may be dismissed from the clinic at the provider's discretion.      For prescription refill requests, have your pharmacy contact our office and allow 72 hours for refills to be completed.    Today you received the following chemotherapy and/or immunotherapy agents vidaza.       To help prevent nausea and vomiting after your treatment, we encourage you to take your nausea medication as directed.  BELOW ARE SYMPTOMS THAT SHOULD BE REPORTED IMMEDIATELY: *FEVER GREATER THAN 100.4 F (38 C) OR HIGHER *CHILLS OR SWEATING *NAUSEA AND VOMITING THAT IS NOT CONTROLLED WITH YOUR NAUSEA MEDICATION *UNUSUAL SHORTNESS OF BREATH *UNUSUAL BRUISING OR BLEEDING *URINARY PROBLEMS (pain or burning when urinating, or frequent urination) *BOWEL PROBLEMS (unusual diarrhea, constipation, pain near the anus) TENDERNESS IN MOUTH AND THROAT WITH OR WITHOUT PRESENCE OF ULCERS (sore throat, sores in mouth, or a toothache) UNUSUAL RASH, SWELLING OR PAIN  UNUSUAL VAGINAL DISCHARGE OR ITCHING   Items with * indicate a potential emergency and should be followed up  as soon as possible or go to the Emergency Department if any problems should occur.  Please show the CHEMOTHERAPY ALERT CARD or IMMUNOTHERAPY ALERT CARD at check-in to the Emergency Department and triage nurse.  Should you have questions after your visit or need to cancel or reschedule your appointment, please contact [facility] [place] - A DEPT OF Eligha Brideg[facility] [phone]  and follow the prompts.  Office hours are 8:00 a.m. to 4:30 p.m. Monday - Friday. Please note that voicemails left after 4:00 p.m. may not be returned until the following business day.  We are closed weekends and major holidays. You have access to a nurse at all times for urgent questions. Please call the main number to the clinic [phone] and follow the prompts.  For any non-urgent questions, you may also contact your provider using MyChart. We now offer e-Visits for anyone 8 and older to request care online for non-urgent symptoms. For details visit [email or web address].   Also download the MyChart app! Go to the app store, search "MyChart", open the app, select [place], and log in with your MyChart username and password.

## 2024-06-13 NOTE — Progress Notes (Deleted)
 Office Visit Note  Patient: Laurie Wallace             Date of Birth: 21-Apr-1946           MRN: [id number]             PCP: [name], MD Referring: [name], MD Visit Date: 06/26/2024   Subjective:  No chief complaint on file.   History of Present Illness: Laurie Wallace is a 78 y.o. female here for follow up for seropositive RA on methotrexate  15 mg p.o. weekly and folic acid  1 mg daily.    Previous HPI 03/27/2024 Laurie Wallace is a 78 y.o. female here for follow up for seropositive RA on methotrexate  15 mg p.o. weekly and folic acid  1 mg daily.    She has a history of blood clots in her lungs, initially identified in March of this year. Despite being on Eliquis , a new clot has developed in her descending aorta, which has increased in size over the past five months based on updated scan last month. She is scheduled for a CT scan with contrast at the end of this week to further evaluate the clot.   She experienced a previous episode of bronchitis, for which she was treated at the hospital. During this time, her white blood cell count was elevated to 22,000, which was attributed to her illness.   Her arthritis is currently well-managed with methotrexate . She denies any recent infections requiring antibiotics since the summer, when she experienced sinus and allergy issues. She mentions that her feet were swollen after increased walking during a recent trip.       Previous HPI 12/23/2023 Laurie Wallace is a 78 y.o. female here for follow up for seropositive RA on methotrexate  15 mg p.o. weekly and folic acid  1 mg daily.  Since our last visit she had muliple ED encounters with multiple subsegmental pulmonary emboli with unknown cause in March and COVID-19 in May.   She was hospitalized for a pulmonary embolism and is currently on anticoagulation therapy. The initial plan was to continue this treatment for six months, but due to the  unknown origin of the clots, her treatment has been extended indefinitely. Lab testing including APS Abs was negative. She is scheduled for a follow-up in six months.   She contracted COVID-19, initially mistaking it for sinus and allergy issues. After receiving treatment for sinus allergies, her condition worsened, leading her to the emergency room where she was found to have this infection.[name] She was unaware of having COVID-19 until the hospital visit.   Her rheumatoid arthritis is currently well-managed, though she experiences pain in her shoulder blade area. No swelling in her hands or knees has been noted. Her blood counts have been low, and her white blood cell count was high during her illness.         Previous HPI 09/22/2023 Laurie Wallace is a 78 y.o. female here for follow up for seropositive RA on methotrexate  15 mg p.o. weekly and folic acid  1 mg daily.     She experiences significant pain in all her toes on both feet, severe enough to prevent wearing shoes or walking. This episode lasted for about two to three days. During this time, she was treated with a steroid and had a brief interruption of methotrexate  for five days. No swelling in her toes or hands was noted during this episode. Outside of this episode, she manages joint pain and stiffness  with over-the-counter options and has not required additional medication.   Her shoulder pain has improved and is not currently a significant issue. She has bone spurs on her heels, likely due to repetitive stress at the Achilles tendon attachment. These spurs are not currently causing significant problems.   She has been experiencing more frequent upper respiratory infections, raising concerns about potential immune suppression from her current methotrexate  dosage.   She wants to return to work after being retired for a year since March 1st. She has been advised to avoid overexertion, particularly concerning her shoulder, and to  consider a gradual return to work.   No swelling in her hands or toes during the recent episode of toe pain. She felt 'a little woozy' upon waking on a recent Sunday morning.         Previous HPI 06/24/2023 Laurie Wallace is a 78 y.o. female here for follow up for follow up for seropositive RA on methotrexate  15 mg p.o. weekly and folic acid  1 mg daily.  She was recently ill with pneumonia three weeks ago, but reports overall good health. They have noticed a contracture on the palm of their hand, which they describe as a little stiff at times but not painful. They also mention occasional soreness in their knee, but it does not seem to significantly bother them.   In terms of their rheumatoid arthritis, they report that it has been well-managed and they have not experienced significant pain or swelling.    Previous HPI 03/24/2023 Laurie Wallace is a 78 y.o. female here for follow up for seropositive RA on methotrexate  15 mg p.o. weekly and folic acid  1 mg daily.  At our last visit had discussed increasing medication due to increased symptoms especially in upper extremities and elevated sedimentation rate.  However she was sick with colitis with severe diarrhea weakness hypokalemia and interrupted treatment for this.  She resumed her methotrexate  at 15 mg p.o. weekly after follow-up with PCP currently feels like her arthritis is doing well.  GI symptoms are mostly resolved she still has reduced appetite has been unable to regain any her unintentional weight loss.   Previous HPI 12/22/2022 Laurie Wallace is a 78 y.o. female here for follow up for seropositive RA on methotrexate  20 mg p.o. weekly and folic acid  1 mg daily.  Overall she feels symptoms are doing pretty well though still describes some pain in both shoulders and stiffness in her hands in the mornings.  Some pain around the back of her neck and shoulders often more bothersome at night.  No flareup of joint  swelling has not required any additional steroid treatments.  She denies any interval significant infections or antibiotics treatment.   Previous HPI 09/21/22 Laurie Wallace is a 78 y.o. female here for follow up for seropositive RA on methotrexate  20 mg p.o. weekly and folic acid  1 mg daily.  Symptoms were doing pretty well with some ongoing left shoulder pain around baseline until acutely developed pain and swelling on the back of her right forearm and wrist at the end of December.  Did not recall any preceding illness no preceding injury to the site or change in usual activities.  Concern was for cellulitis versus RA flareup and treated with course of oral doxycycline and oral prednisone  medication for 5 days.  She felt symptoms almost entirely cleared up after the first 2 days of treatment.  There is still been a small persistent swelling or nodule on the  affected area ever since. Occasionally notices tenderness at the site but no pain with use.   Previous HPI 06/22/22 Laurie Wallace is a 78 y.o. female here for follow up for seropositive RA on MTX 20 mg PO weekly and folic acid  1 mg daily. She is doing well recently, slightly less busy with work so less lifting and shoulder pain is improved. Right upper arm still with some pain and stiffness. Not seeing any swelling and hand and knee pain is all doing well. Notices small increase in symptoms with the recent cooler weather usually just with the rain.   Previous HPI 02/26/22 Laurie Wallace is a 78 y.o. female here for follow up seropositive RA.  She has been off of the p.o. methotrexate  for about a month.  She experienced increased joint pain in shoulders and knees off the medication.  She took a Medrol  Dosepak which improved joint symptoms pretty quickly.   Previous HPI 10/13/2021 Laurie Wallace is a 78 y.o. female here for follow up for seropositive RA on methotrexate  20 mg PO weekly. She is doing well  since last visit her shoulder pain increases on days with poor weather or if she uses her arms a lot more than usual. Otherwise pain free on some days. She had COVID infection last month treated with paxlovid  and tessalon  and tylenol  with good improvement of symptoms.    Previous HPI 07/10/21 Laurie Wallace is a 78 y.o. female here for follow up for seropositive RA on methotrexate  20 mg PO weekly which was increased after last visit.  She feels symptoms are overall improving she has 0 pain on some days occasionally swelling and stiffness affecting the left arm.  Left shoulder range of motion remains slightly reduced but with much less pain.  She denies any new side effects on increased methotrexate  dose.  She has regained about 5 pounds since having some unintentional weight loss last year denies any GI complaints reports normal appetite.   Previous HPI   12/11/20 Laurie Wallace is a 78 y.o. female here for evaluation of joint pains in multiple areas with elevated inflammatory markers.  She believes these symptoms in her shoulder started since about 4 5 months ago without any specific preceding injury or event that she can recall.  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  She does not recall any past significant shoulder injuries or surgeries. Previous treatments tried include cymbalta  that she does not notice a significant improvement.  Since last month she was started on oral daily prednisone  for the past few weeks and has noticed a significant improvement in her joint symptoms particularly resolution of the hand swelling although does continue having some left shoulder pain.   LAbs reviewed 10/2020 ANA neg ACE neg RF neg ANCAs neg ESR 68 CRP 25 Alk phos 126 Plts 495 CK 55   No Rheumatology ROS completed.   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/2024   COVID-19 11/08/2023   Acute non-recurrent maxillary sinusitis 11/08/2023   Lupus anticoagulant positive 10/13/2023   Dizziness 10/13/2023   Pulmonary embolism (HCC)  09/29/2023   Pulmonary nodule 1 cm or greater in diameter 06/02/2023   Microcytic anemia 02/17/2023   Hospital discharge follow-up 01/18/2023   Gastroesophageal reflux disease 01/18/2023   Gait disturbance 01/18/2023   Nonintractable episodic headache 01/18/2023   Infectious colitis 01/10/2023   Hypokalemia 01/10/2023   Thrombocytosis 01/10/2023   Subcutaneous cyst 07/20/2022   Allergic sinusitis 01/14/2022   Encounter for general adult medical examination with abnormal findings 07/17/2021   Age-related osteoporosis without current pathological fracture 07/17/2021   Muscle cramps 02/28/2021   High risk medication use 12/26/2020   Seropositive rheumatoid arthritis (HCC) 10/25/2020   Moderate protein-calorie malnutrition 10/25/2020   Cervical spine pain 10/23/2020   Lumbar pain 10/23/2020   Other intervertebral disc displacement, lumbar region 04/07/2010    Past Medical History:  Diagnosis Date   Allergy    Anemia    Iron  Deficiency   Arthritis    Rheumatoid Arthritis   Family history of adverse reaction to anesthesia    Sister has N/V   GERD (gastroesophageal reflux disease)    Peripheral vascular disease    Pneumonia     Family History  Problem Relation Age of Onset   Cancer Mother    Cancer Father    Heart failure Sister    COPD Sister    Dementia Sister    Past Surgical History:  Procedure Laterality Date   ABDOMINAL HYSTERECTOMY  2000   IR BONE MARROW BIOPSY & ASPIRATION  05/03/2024    THORACIC AORTIC ENDOVASCULAR STENT GRAFT N/A 04/12/2024   Procedure: INSERTION, ENDOVASCULAR STENT GRAFT, AORTA, THORACIC;  Surgeon: Magda Debby SAILOR, MD;  Location: [facility] OR;  Service: Vascular;  Laterality: N/A;   TOE SURGERY Right 2015   1st toe   ULTRASOUND GUIDANCE FOR VASCULAR ACCESS Right 04/12/2024   Procedure: ULTRASOUND GUIDANCE, FOR VASCULAR ACCESS;  Surgeon: Magda Debby SAILOR, MD;  Location: [facility] OR;  Service: Vascular;  Laterality: Right;   Social History   Social History Narrative   Lives with her son   Right Handed   Drinks 1-2 cups caffeine daily in winter   Immunization History  Administered Date(s) Administered   PNEUMOCOCCAL CONJUGATE-20 02/28/2021     Objective: Vital Signs: There were no vitals taken for this visit.   Physical Exam   Musculoskeletal Exam: ***  CDAI Exam: CDAI Score: -- Patient Global: --; Provider Global: -- Swollen: --; Tender: -- Joint Exam 06/26/2024   No joint exam has been documented for this visit   There is currently no information documented on the homunculus. Go to the Rheumatology activity and complete the homunculus joint exam.  Investigation: No additional findings.  Imaging: No results found.  Recent Labs: Lab Results  Component Value Date   WBC 38.3 (H) 06/13/2024   HGB 9.0 (L) 06/13/2024   PLT 635 (H) 06/13/2024   NA 142 06/13/2024   K 4.1 06/13/2024   CL 108 06/13/2024   CO2 21 (L) 06/13/2024   GLUCOSE 86 06/13/2024   BUN 8 06/13/2024   CREATININE 1.01 (H) 06/13/2024   BILITOT 0.4 06/13/2024   ALKPHOS 100 06/13/2024   AST 27 06/13/2024   ALT 16 06/13/2024   PROT 7.0 06/13/2024   ALBUMIN  4.4 06/13/2024   CALCIUM 9.2 06/13/2024   GFRAA 88 09/28/2014    Speciality Comments: No specialty comments available.  Procedures:  No procedures performed Allergies: Patient has no known allergies.   Assessment / Plan:     Visit Diagnoses: No diagnosis found.  ***  Orders:  No orders of the defined types were  placed in this encounter.  No orders of the defined types were placed in this encounter.    Follow-Up Instructions: No follow-ups on file.   Milbern Doescher M Coleson Kant, CMA  Note - This record has been created using Animal nutritionist.  Chart creation errors have been sought, but may not always  have been located. Such creation errors do not reflect on  the standard of medical care.

## 2024-06-13 NOTE — Progress Notes (Signed)
 Pharmacist Chemotherapy Monitoring - Initial Assessment    Anticipated start date: 06/13/24   The following has been reviewed per standard work regarding the patient's treatment regimen: The patient's diagnosis, treatment plan and drug doses, and organ/hematologic function Lab orders and baseline tests specific to treatment regimen  The treatment plan start date, drug sequencing, and pre-medications Prior authorization status  Patient's documented medication list, including drug-drug interaction screen and prescriptions for anti-emetics and supportive care specific to the treatment regimen The drug concentrations, fluid compatibility, administration routes, and timing of the medications to be used The patient's access for treatment and lifetime cumulative dose history, if applicable  The patient's medication allergies and previous infusion related reactions, if applicable   Changes made to treatment plan:  treatment plan date  Patient canceled day 1 and moved to 12/9 - will add extra day to next week.  Port also being placed next Monday so no chemo that day.   Follow up needed:  N/A   Laurie Wallace, [facility], 06/13/2024  1:38 PM

## 2024-06-13 NOTE — Progress Notes (Signed)
 Patient tolerated chemotherapy with no complaints voiced.  Side effects with management reviewed with understanding verbalized.  IV site clean and dry with no bruising or swelling noted at site.  Good blood return noted before and after administration of chemotherapy.  Band aid applied.  Patient left in satisfactory condition with VSS and no s/s of distress noted. All follow ups as scheduled.   Laurie Wallace [employer]

## 2024-06-14 ENCOUNTER — Other Ambulatory Visit: Payer: Self-pay | Admitting: Internal Medicine

## 2024-06-14 ENCOUNTER — Telehealth: Payer: Self-pay | Admitting: Internal Medicine

## 2024-06-14 ENCOUNTER — Ambulatory Visit: Payer: Medicare HMO

## 2024-06-14 ENCOUNTER — Inpatient Hospital Stay

## 2024-06-14 ENCOUNTER — Encounter: Payer: Self-pay | Admitting: Oncology

## 2024-06-14 VITALS — BP 111/65 | HR 86 | Temp 98.0°F | Resp 18

## 2024-06-14 VITALS — BP 124/70 | HR 85 | Resp 12 | Ht 66.0 in | Wt 117.0 lb

## 2024-06-14 DIAGNOSIS — Z5111 Encounter for antineoplastic chemotherapy: Secondary | ICD-10-CM | POA: Diagnosis not present

## 2024-06-14 DIAGNOSIS — J309 Allergic rhinitis, unspecified: Secondary | ICD-10-CM

## 2024-06-14 DIAGNOSIS — Z0000 Encounter for general adult medical examination without abnormal findings: Secondary | ICD-10-CM

## 2024-06-14 DIAGNOSIS — C9310 Chronic myelomonocytic leukemia not having achieved remission: Secondary | ICD-10-CM

## 2024-06-14 MED ORDER — SODIUM CHLORIDE 0.9 % IV SOLN: INTRAVENOUS | Status: DC

## 2024-06-14 MED ORDER — SODIUM CHLORIDE 0.9 % IV SOLN
75.0000 mg/m2 | Freq: Once | INTRAVENOUS | Status: AC
  Administered 2024-06-14: 116 mg via INTRAVENOUS
  Filled 2024-06-14: qty 11.6

## 2024-06-14 MED ORDER — ONDANSETRON HCL 4 MG/2ML IJ SOLN
8.0000 mg | Freq: Once | INTRAMUSCULAR | Status: AC
  Administered 2024-06-14: 8 mg via INTRAVENOUS
  Filled 2024-06-14: qty 4

## 2024-06-14 NOTE — Patient Instructions (Signed)
 Ms. Blow,  Thank you for taking the time for your Medicare Wellness Visit. I appreciate your continued commitment to your health goals. Please review the care plan we discussed, and feel free to reach out if I can assist you further.  Please note that Annual Wellness Visits do not include a physical exam. Some assessments may be limited, especially if the visit was conducted virtually. If needed, we may recommend an in-person follow-up with your provider.  Ongoing Care Seeing your primary care provider every 3 to 6 months helps us  monitor your health and provide consistent, personalized care.   Next office visit with primary care provider: September 18, 2024 at 10:00 am with Dr. Tobie  1 year follow up for Medicare well visit: June 15, 2025 at 1:50 pm  with medicare wellness nurse in office  Referrals If a referral was made during today's visit and you haven't received any updates within two weeks, please contact the referred provider directly to check on the status.  None placed today  Recommended Screenings:  Health Maintenance  Topic Date Due   DTaP/Tdap/Td vaccine (1 - Tdap) Never done   Zoster (Shingles) Vaccine (1 of 2) Never done   Medicare Annual Wellness Visit  06/13/2024   Flu Shot  10/03/2024*   Breast Cancer Screening  10/20/2024   Osteoporosis screening with Bone Density Scan  10/20/2025   Pneumococcal Vaccine for age over 16  Completed   Hepatitis C Screening  Completed   Meningitis B Vaccine  Aged Out   COVID-19 Vaccine  Discontinued  *Topic was postponed. The date shown is not the original due date.       06/14/2024   11:02 AM  Advanced Directives  Does Patient Have a Medical Advance Directive? No  Would patient like information on creating a medical advance directive? Yes (MAU/Ambulatory/Procedural Areas - Information given)    Vision: Annual vision screenings are recommended for early detection of glaucoma, cataracts, and diabetic retinopathy. These  exams can also reveal signs of chronic conditions such as diabetes and high blood pressure.  Dental: Annual dental screenings help detect early signs of oral cancer, gum disease, and other conditions linked to overall health, including heart disease and diabetes.  Please see the attached documents for additional preventive care recommendations.

## 2024-06-14 NOTE — Progress Notes (Signed)
 Vaccines not given: Flu declined today  Chief Complaint  Patient presents with   Medicare Wellness     Subjective:   Laurie Wallace is a 78 y.o. female who presents for a Medicare Annual Wellness Visit.  Visit info / Clinical Intake: Medicare Wellness Visit Type:: Subsequent Annual Wellness Visit Persons participating in visit and providing information:: patient Medicare Wellness Visit Mode:: In-person (required for WTM) Interpreter Needed?: No Pre-visit prep was completed: yes AWV questionnaire completed by patient prior to visit?: no Living arrangements:: with family/others Patient's Overall Health Status Rating: good Does pain affect daily life?: no Are you currently prescribed opioids?: no  Dietary Habits and Nutritional Risks How many meals a day?: 2 Eats fruit and vegetables daily?: yes Most meals are obtained by: preparing own meals In the last 2 weeks, have you had any of the following?: none Diabetic:: no  Functional Status Activities of Daily Living (to include ambulation/medication): Independent Ambulation: Independent Home Assistive Devices/Equipment: Walker (specify Type) Medication Administration: Independent Home Management (perform basic housework or laundry): Independent Manage your own finances?: yes Primary transportation is: driving Concerns about vision?: no *vision screening is required for WTM* Concerns about hearing?: no  Fall Screening Falls in the past year?: 0 Number of falls in past year: 0 Was there an injury with Fall?: 0 Fall Risk Category Calculator: 0 Patient Fall Risk Level: Low Fall Risk  Fall Risk Patient at Risk for Falls Due to: No Fall Risks Fall risk Follow up: Falls evaluation completed; Education provided; Falls prevention discussed  Home and Transportation Safety: All rugs have non-skid backing?: yes All stairs or steps have railings?: yes Grab bars in the bathtub or shower?: (!) no Have non-skid surface  in bathtub or shower?: (!) no Good home lighting?: yes Regular seat belt use?: yes Hospital stays in the last year:: (!) yes How many hospital stays:: 3 Reason: pulmonary embolisms, aortic embolism, infections  Cognitive Assessment Difficulty concentrating, remembering, or making decisions? : no Will 6CIT or Mini Cog be Completed: no 6CIT or Mini Cog Declined: patient alert, oriented, able to answer questions appropriately and recall recent events  Advance Directives (For Healthcare) Does Patient Have a Medical Advance Directive?: No Would patient like information on creating a medical advance directive?: Yes (MAU/Ambulatory/Procedural Areas - Information given)  Reviewed/Updated  Reviewed/Updated: Reviewed All (Medical, Surgical, Family, Medications, Allergies, Care Teams, Patient Goals)    Allergies (verified) Patient has no known allergies.   Current Medications (verified) Outpatient Encounter Medications as of 06/14/2024  Medication Sig   allopurinol  (ZYLOPRIM ) 100 MG tablet Take 1 tablet (100 mg total) by mouth daily.   apixaban  (ELIQUIS ) 5 MG TABS tablet Take 1 tablet (5 mg total) by mouth 2 (two) times daily.   aspirin  EC 81 MG tablet Take 81 mg by mouth daily. Swallow whole.   azaCITIDine  5 mg/2 mLs in lactated ringers  infusion Inject into the vein every 28 (twenty-eight) days.   famotidine  (PEPCID ) 40 MG tablet TAKE ONE TABLET BY MOUTH DAILY   folic acid  (FOLVITE ) 1 MG tablet Take 1 tablet (1 mg total) by mouth daily.   hydrOXYzine  (ATARAX ) 10 MG tablet Take 1 tablet (10 mg total) by mouth 2 (two) times daily as needed for anxiety.   lidocaine -prilocaine  (EMLA ) cream Apply to affected area once   ondansetron  (ZOFRAN ) 4 MG tablet Take 1 tablet (4 mg total) by mouth every 8 (eight) hours as needed for nausea or vomiting.   ondansetron  (ZOFRAN ) 8 MG tablet Take 1 tablet (  8 mg total) by mouth every 8 (eight) hours as needed for nausea or vomiting.   prochlorperazine   (COMPAZINE ) 10 MG tablet Take 1 tablet (10 mg total) by mouth every 6 (six) hours as needed for nausea or vomiting.   methotrexate  (RHEUMATREX) 2.5 MG tablet Take 6 tablets (15 mg total) by mouth once a week. Caution:Chemotherapy. Protect from light. (Patient not taking: Reported on 06/14/2024)   [DISCONTINUED] brompheniramine (VAZOL) 2 MG/5ML LIQD Take 2 mg by mouth 2 (two) times daily.   No facility-administered encounter medications on file as of 06/14/2024.    History: Past Medical History:  Diagnosis Date   Allergy    Anemia    Iron  Deficiency   Arthritis    Rheumatoid Arthritis   Family history of adverse reaction to anesthesia    Sister has N/V   GERD (gastroesophageal reflux disease)    Peripheral vascular disease    Pneumonia    Past Surgical History:  Procedure Laterality Date   ABDOMINAL HYSTERECTOMY  2000   IR BONE MARROW BIOPSY & ASPIRATION  05/03/2024   THORACIC AORTIC ENDOVASCULAR STENT GRAFT N/A 04/12/2024   Procedure: INSERTION, ENDOVASCULAR STENT GRAFT, AORTA, THORACIC;  Surgeon: Magda Debby SAILOR, MD;  Location: [facility] OR;  Service: Vascular;  Laterality: N/A;   TOE SURGERY Right 2015   1st toe   ULTRASOUND GUIDANCE FOR VASCULAR ACCESS Right 04/12/2024   Procedure: ULTRASOUND GUIDANCE, FOR VASCULAR ACCESS;  Surgeon: Magda Debby SAILOR, MD;  Location: [facility] OR;  Service: Vascular;  Laterality: Right;   Family History  Problem Relation Age of Onset   Cancer Mother    Cancer Father    Heart failure Sister    COPD Sister    Dementia Sister    Social History   Occupational History   Occupation: full time  Tobacco Use   Smoking status: Never    Passive exposure: Never   Smokeless tobacco: Never  Vaping Use   Vaping status: Never Used  Substance and Sexual Activity   Alcohol use: No   Drug use: No   Sexual activity: Not Currently   Tobacco Counseling Counseling given: Yes  SDOH Screenings   Food Insecurity: No Food Insecurity (06/14/2024)  Housing: Low  Risk  (06/14/2024)  Transportation Needs: No Transportation Needs (06/14/2024)  Utilities: Not At Risk (06/14/2024)  Alcohol Screen: Low Risk  (06/14/2023)  Depression (PHQ2-9): Low Risk  (06/14/2024)  Financial Resource Strain: Low Risk  (06/14/2023)  Physical Activity: Sufficiently Active (06/14/2024)  Social Connections: Moderately Isolated (06/14/2024)  Stress: No Stress Concern Present (06/14/2024)  Tobacco Use: Low Risk  (06/14/2024)  Health Literacy: Adequate Health Literacy (06/14/2024)   See flowsheets for full screening details  Depression Screen PHQ 2 & 9 Depression Scale- Over the past 2 weeks, how often have you been bothered by any of the following problems? Little interest or pleasure in doing things: 0 Feeling down, depressed, or hopeless (PHQ Adolescent also includes...irritable): 0 PHQ-2 Total Score: 0 Trouble falling or staying asleep, or sleeping too much: 0 Feeling tired or having little energy: 0 Poor appetite or overeating (PHQ Adolescent also includes...weight loss): 0 Feeling bad about yourself - or that you are a failure or have let yourself or your family down: 0 Trouble concentrating on things, such as reading the newspaper or watching television (PHQ Adolescent also includes...like school work): 0 Moving or speaking so slowly that other people could have noticed. Or the opposite - being so fidgety or restless that you have been moving around  a lot more than usual: 0 Thoughts that you would be better off dead, or of hurting yourself in some way: 0 PHQ-9 Total Score: 0 If you checked off any problems, how difficult have these problems made it for you to do your work, take care of things at home, or get along with other people?: Not difficult at all     Goals Addressed             This Visit's Progress    Patient Stated   On track    Get better and have better health             Objective:    Today's Vitals   06/14/24 1100  BP: 124/70  Pulse:  85  Resp: 12  SpO2: 98%  Weight: 117 lb (53.1 kg)  Height: 5' 6 (1.676 m)   Body mass index is 18.88 kg/m.  Hearing/Vision screen Hearing Screening - Comments:: Patient denies any hearing difficulties.   Vision Screening - Comments:: Wears rx glasses - up to date with routine eye exams with  Selinda Reusing @ [facility] Op[facility] Immunizations and Health Maintenance Health Maintenance  Topic Date Due   DTaP/Tdap/Td (1 - Tdap) Never done   Zoster Vaccines- Shingrix (1 of 2) Never done   Influenza Vaccine  10/03/2024 (Originally 02/04/2024)   Mammogram  10/20/2024   Medicare Annual Wellness (AWV)  06/14/2025   Bone Density Scan  10/20/2025   Pneumococcal Vaccine: 50+ Years  Completed   Hepatitis C Screening  Completed   Meningococcal B Vaccine  Aged Out   COVID-19 Vaccine  Discontinued        Assessment/Plan:  This is a routine wellness examination for Laurie Wallace.  Patient Care Team: [name], MD as PCP - General (Internal Medicine) Jeannetta Lonni ORN, MD as Consulting Physician (Rheumatology) Reusing Selinda, OD as Referring Physician (Optometry) Elois, Devere SAUNDERS, LCSW as Social Worker (Licensed Clinical Social Worker) Darlean, Ozell NOVAK, MD as Consulting Physician (Pulmonary Disease) Davonna Siad, MD as Consulting Physician (Oncology) Perri Karie CROME, MD as Referring Physician (Internal Medicine) Magda Debby SAILOR, MD as Consulting Physician (Vascular Surgery)  I have personally reviewed and noted the following in the patients chart:   Medical and social history Use of alcohol, tobacco or illicit drugs  Current medications and supplements including opioid prescriptions. Functional ability and status Nutritional status Physical activity Advanced directives List of other physicians Hospitalizations, surgeries, and ER visits in previous 12 months Vitals Screenings to include cognitive, depression, and falls Referrals and appointments  No orders of the defined  types were placed in this encounter.  In addition, I have reviewed and discussed with patient certain preventive protocols, quality metrics, and best practice recommendations. A written personalized care plan for preventive services as well as general preventive health recommendations were provided to patient.   Clydell Alberts, CMA   06/14/2024   Return on June 15, 2025 at 1:50 pm, for your yearly Medicare Wellness Visit in person.  After Visit Summary: (In Person-Printed) AVS printed and given to the patient

## 2024-06-14 NOTE — Patient Instructions (Signed)
 [facility] [place] [facility]  Discharge Instructions: Thank you for choosing [facility] to provide your oncology and hematology care.  If you have a lab appointment with the Cancer Center - please note that after April 8th, 2024, all labs will be drawn in the cancer center.  You do not have to check in or register with the main entrance as you have in the past but will complete your check-in in the cancer center.  Wear comfortable clothing and clothing appropriate for easy access to any Portacath or PICC line.   We strive to give you quality time with your provider. You may need to reschedule your appointment if you arrive late (15 or more minutes).  Arriving late affects you and other patients whose appointments are after yours.  Also, if you miss three or more appointments without notifying the office, you may be dismissed from the clinic at the provider's discretion.      For prescription refill requests, have your pharmacy contact our office and allow 72 hours for refills to be completed.    Today you received the following chemotherapy and/or immunotherapy agents Vidaza      To help prevent nausea and vomiting after your treatment, we encourage you to take your nausea medication as directed.  BELOW ARE SYMPTOMS THAT SHOULD BE REPORTED IMMEDIATELY: *FEVER GREATER THAN 100.4 F (38 C) OR HIGHER *CHILLS OR SWEATING *NAUSEA AND VOMITING THAT IS NOT CONTROLLED WITH YOUR NAUSEA MEDICATION *UNUSUAL SHORTNESS OF BREATH *UNUSUAL BRUISING OR BLEEDING *URINARY PROBLEMS (pain or burning when urinating, or frequent urination) *BOWEL PROBLEMS (unusual diarrhea, constipation, pain near the anus) TENDERNESS IN MOUTH AND THROAT WITH OR WITHOUT PRESENCE OF ULCERS (sore throat, sores in mouth, or a toothache) UNUSUAL RASH, SWELLING OR PAIN  UNUSUAL VAGINAL DISCHARGE OR ITCHING   Items with * indicate a potential emergency and should be followed up as  soon as possible or go to the Emergency Department if any problems should occur.  Please show the CHEMOTHERAPY ALERT CARD or IMMUNOTHERAPY ALERT CARD at check-in to the Emergency Department and triage nurse.  Should you have questions after your visit or need to cancel or reschedule your appointment, please contact [facility] CANCER CTR [place] - A DEPT OF Eligha Bridegroom [facility] [phone]  and follow the prompts.  Office hours are 8:00 a.m. to 4:30 p.m. Monday - Friday. Please note that voicemails left after 4:00 p.m. may not be returned until the following business day.  We are closed weekends and major holidays. You have access to a nurse at all times for urgent questions. Please call the main number to the clinic [phone] and follow the prompts.  For any non-urgent questions, you may also contact your provider using MyChart. We now offer e-Visits for anyone 72 and older to request care online for non-urgent symptoms. For details visit [email or web address].   Also download the MyChart app! Go to the app store, search "MyChart", open the app, select [place], and log in with your MyChart username and password.

## 2024-06-14 NOTE — Progress Notes (Signed)
 Patient presents today for D2C1 Vidaza  infusion per providers order.  Vital signs WNL.  Patient has no new complaints at this time.   Peripheral IV started and blood return noted pre and post infusion.  Treatment given today per MD orders.  Stable duirng infusion without adverse affects.  Vital signs stable.  No complaints at this time.  Discharge from clinic ambulatory in stable condition.  Alert and oriented X 3.  Follow up with [facility] as scheduled.

## 2024-06-14 NOTE — Telephone Encounter (Signed)
 Copied from CRM [id number]. Topic: Appointments - Appointment Info/Confirmation >> Jun 14, 2024 10:31 AM Delon DASEN wrote: Patient/patient representative is calling for information regarding an appointment. Gave correct phone number to call for appt for the AWV- [phone]

## 2024-06-15 ENCOUNTER — Inpatient Hospital Stay

## 2024-06-15 ENCOUNTER — Other Ambulatory Visit: Payer: Self-pay

## 2024-06-15 ENCOUNTER — Other Ambulatory Visit: Payer: Self-pay | Admitting: Oncology

## 2024-06-15 ENCOUNTER — Encounter: Payer: Self-pay | Admitting: Oncology

## 2024-06-15 VITALS — BP 106/41 | HR 80 | Temp 98.1°F | Resp 18

## 2024-06-15 DIAGNOSIS — C9310 Chronic myelomonocytic leukemia not having achieved remission: Secondary | ICD-10-CM

## 2024-06-15 DIAGNOSIS — J309 Allergic rhinitis, unspecified: Secondary | ICD-10-CM

## 2024-06-15 DIAGNOSIS — Z5111 Encounter for antineoplastic chemotherapy: Secondary | ICD-10-CM | POA: Diagnosis not present

## 2024-06-15 MED ORDER — ONDANSETRON HCL 4 MG/2ML IJ SOLN
8.0000 mg | Freq: Once | INTRAMUSCULAR | Status: AC
  Administered 2024-06-15: 8 mg via INTRAVENOUS
  Filled 2024-06-15: qty 4

## 2024-06-15 MED ORDER — ALLOPURINOL 100 MG PO TABS: 100.0000 mg | ORAL_TABLET | Freq: Every day | ORAL | 1 refills | Status: AC

## 2024-06-15 MED ADMIN — AZACITIDINE CHEMO IV INFUSION (NS): 116 mg | INTRAVENOUS | NDC 72485020101

## 2024-06-15 MED FILL — Azacitidine For Inj 100 MG: 75.0000 mg/m2 | INTRAMUSCULAR | Qty: 11.6 | Status: AC

## 2024-06-15 NOTE — Patient Instructions (Signed)
 [facility] [place] - A DEPT OF [facility]  Discharge Instructions: Thank you for choosing [facility] to provide your oncology and hematology care.  If you have a lab appointment with the Cancer Center - please note that after April 8th, 2024, all labs will be drawn in the cancer center.  You do not have to check in or register with the main entrance as you have in the past but will complete your check-in in the cancer center.  Wear comfortable clothing and clothing appropriate for easy access to any Portacath or PICC line.   We strive to give you quality time with your provider. You may need to reschedule your appointment if you arrive late (15 or more minutes).  Arriving late affects you and other patients whose appointments are after yours.  Also, if you miss three or more appointments without notifying the office, you may be dismissed from the clinic at the provider's discretion.      For prescription refill requests, have your pharmacy contact our office and allow 72 hours for refills to be completed.    Today you received the following chemotherapy and/or immunotherapy agents D3 Vidaza     To help prevent nausea and vomiting after your treatment, we encourage you to take your nausea medication as directed.  Azacitidine  Injection What is this medication? AZACITIDINE  (ay [employer] i deen) treats blood and bone marrow cancers. It works by slowing down the growth of cancer cells. This medicine may be used for other purposes; ask your health care provider or pharmacist if you have questions. COMMON BRAND NAME(S): Vidaza  What should I tell my care team before I take this medication? They need to know if you have any of these conditions: Kidney disease Liver disease Low blood cell levels, such as low white cells, platelets, or red blood cells Low levels of albumin in the blood Low levels of bicarbonate in the blood An unusual or allergic reaction to  azacitidine , mannitol, other medications, foods, dyes, or preservatives If you or your partner are pregnant or trying to get pregnant Breast-feeding How should I use this medication? This medication is injected into a vein or under the skin. It is given by your care team in a hospital or clinic setting. Talk to your care team about the use of this medication in children. While it may be prescribed for children as young as 1 month for selected conditions, precautions do apply. Overdosage: If you think you have taken too much of this medicine contact a poison control center or emergency room at once. NOTE: This medicine is only for you. Do not share this medicine with others. What if I miss a dose? Keep appointments for follow-up doses. It is important not to miss your dose. Call your care team if you are unable to keep an appointment. What may interact with this medication? Interactions are not expected. This list may not describe all possible interactions. Give your health care provider a list of all the medicines, herbs, non-prescription drugs, or dietary supplements you use. Also tell them if you smoke, drink alcohol, or use illegal drugs. Some items may interact with your medicine. What should I watch for while using this medication? Your condition will be monitored carefully while you are receiving this medication. This medication may make you feel generally unwell. This is not uncommon as chemotherapy can affect healthy cells as well as cancer cells. Report any side effects. Continue your course of  treatment even though you feel ill unless your care team tells you to stop. You may need blood work done while you are taking this medication. Other product types may be available that contain the medication azacitidine . The injection and oral products should not be used in place of one another. Talk to your care team if you have questions. This medication can cause serious side effects. To reduce  the risk, your care team may give you other medications to take before receiving this one. Be sure to follow the directions from your care team. This medication may increase your risk of getting an infection. Call your care team for advice if you get a fever, chills, sore throat, or other symptoms of a cold or flu. Do not treat yourself. Try to avoid being around people who are sick. Avoid taking medications that contain aspirin, acetaminophen , ibuprofen, naproxen, or ketoprofen unless instructed by your care team. These medications may hide a fever. Be careful brushing or flossing your teeth or using a toothpick because you may get an infection or bleed more easily. If you have any dental work done, tell your dentist you are receiving this medication. Talk to your care team if you or your partner may be pregnant. Serious birth defects can occur if you take this medication during pregnancy and for 6 months after the last dose. You will need a negative pregnancy test before starting this medication. Contraception is recommended while taking this medication and for 6 months after the last dose. Your care team can help you find the option that works for you. If your partner can get pregnant, use a condom during sex while taking this medication and for 3 months after the last dose. Do not breastfeed while taking this medication and for 1 week after the last dose. This medication may cause infertility. Talk to your care team if you are concerned about your fertility. What side effects may I notice from receiving this medication? Side effects that you should report to your care team as soon as possible: Allergic reactions--skin rash, itching, hives, swelling of the face, lips, tongue, or throat Infection--fever, chills, cough, sore throat, wounds that don't heal, pain or trouble when passing urine, general feeling of discomfort or being unwell Kidney injury--decrease in the amount of urine, swelling of the  ankles, hands, or feet Liver injury--right upper belly pain, loss of appetite, nausea, light-colored stool, dark yellow or brown urine, yellowing skin or eyes, unusual weakness or fatigue Low red blood cell level--unusual weakness or fatigue, dizziness, headache, trouble breathing Tumor lysis syndrome (TLS)--nausea, vomiting, diarrhea, decrease in the amount of urine, dark urine, unusual weakness or fatigue, confusion, muscle pain or cramps, fast or irregular heartbeat, joint pain Unusual bruising or bleeding Side effects that usually do not require medical attention (report to your care team if they continue or are bothersome): Constipation Diarrhea Nausea Pain, redness, or irritation at injection site Vomiting This list may not describe all possible side effects. Call your doctor for medical advice about side effects. You may report side effects to FDA at 1-800-FDA-1088. Where should I keep my medication? This medication is given in a hospital or clinic. It will not be stored at home. NOTE: This sheet is a summary. It may not cover all possible information. If you have questions about this medicine, talk to your doctor, pharmacist, or health care provider.  2024 Elsevier/Gold Standard (2023-02-22 00:00:00)  BELOW ARE SYMPTOMS THAT SHOULD BE REPORTED IMMEDIATELY: *FEVER GREATER THAN 100.4 F (38  C) OR HIGHER *CHILLS OR SWEATING *NAUSEA AND VOMITING THAT IS NOT CONTROLLED WITH YOUR NAUSEA MEDICATION *UNUSUAL SHORTNESS OF BREATH *UNUSUAL BRUISING OR BLEEDING *URINARY PROBLEMS (pain or burning when urinating, or frequent urination) *BOWEL PROBLEMS (unusual diarrhea, constipation, pain near the anus) TENDERNESS IN MOUTH AND THROAT WITH OR WITHOUT PRESENCE OF ULCERS (sore throat, sores in mouth, or a toothache) UNUSUAL RASH, SWELLING OR PAIN  UNUSUAL VAGINAL DISCHARGE OR ITCHING   Items with * indicate a potential emergency and should be followed up as soon as possible or go to the  Emergency Department if any problems should occur.  Please show the CHEMOTHERAPY ALERT CARD or IMMUNOTHERAPY ALERT CARD at check-in to the Emergency Department and triage nurse.  Should you have questions after your visit or need to cancel or reschedule your appointment, please contact [facility]ER CTR [place] - A DEPT OF [facility] [phone]  and follow the prompts.  Office hours are 8:00 a.m. to 4:30 p.m. Monday - Friday. Please note that voicemails left after 4:00 p.m. may not be returned until the following business day.  We are closed weekends and major holidays. You have access to a nurse at all times for urgent questions. Please call the main number to the clinic [phone] and follow the prompts.  For any non-urgent questions, you may also contact your provider using MyChart. We now offer e-Visits for anyone 68 and older to request care online for non-urgent symptoms. For details visit [email or web address].   Also download the MyChart app! Go to the app store, search "MyChart", open the app, select [email or web address], and log in with your MyChart username and password.

## 2024-06-15 NOTE — Progress Notes (Signed)
 Patient presents today for D3 Vidaza  chemotherapy infusion.  Patient is in satisfactory condition with no new complaints voiced.  Vital signs are stable.  Labs reviewed and all labs are within treatment parameters.  We will proceed with treatment per MD orders.    Treatment given today per MD orders. Tolerated infusion without adverse affects. Vital signs stable. No complaints at this time. Discharged from clinic ambulatory in stable condition. Alert and oriented x 3. F/U with [facility] as scheduled.

## 2024-06-16 ENCOUNTER — Other Ambulatory Visit: Payer: Self-pay

## 2024-06-16 ENCOUNTER — Inpatient Hospital Stay

## 2024-06-16 ENCOUNTER — Other Ambulatory Visit: Payer: Self-pay | Admitting: Radiology

## 2024-06-16 VITALS — BP 120/45 | HR 89 | Temp 98.4°F | Resp 18

## 2024-06-16 DIAGNOSIS — Z5111 Encounter for antineoplastic chemotherapy: Secondary | ICD-10-CM | POA: Diagnosis not present

## 2024-06-16 DIAGNOSIS — C9310 Chronic myelomonocytic leukemia not having achieved remission: Secondary | ICD-10-CM

## 2024-06-16 MED ORDER — SODIUM CHLORIDE 0.9 % IV SOLN
75.0000 mg/m2 | Freq: Once | INTRAVENOUS | Status: AC
  Administered 2024-06-16: 116 mg via INTRAVENOUS
  Filled 2024-06-16: qty 11.6

## 2024-06-16 MED ORDER — ONDANSETRON HCL 4 MG/2ML IJ SOLN
8.0000 mg | Freq: Once | INTRAMUSCULAR | Status: AC
  Administered 2024-06-16: 8 mg via INTRAVENOUS
  Filled 2024-06-16: qty 4

## 2024-06-16 MED ORDER — SODIUM CHLORIDE 0.9 % IV SOLN: INTRAVENOUS | Status: AC

## 2024-06-16 NOTE — Progress Notes (Unsigned)
 Patient presents today for chemotherapy Vidaza  infusion.  Patient is in satisfactory condition with no new complaints voiced.  Vital signs are stable.  Labs reviewed and all labs are within treatment parameters.  We will proceed with treatment per MD orders.    Treatment given today per MD orders. Tolerated infusion without adverse affects. Vital signs stable. No complaints at this time. Discharged from clinic ambulatory in stable condition. Alert and oriented x 3. F/U with [facility] as scheduled.

## 2024-06-16 NOTE — Patient Instructions (Signed)
 [facility] [place] [facility]  Discharge Instructions: Thank you for choosing [facility] to provide your oncology and hematology care.  If you have a lab appointment with the Cancer Center - please note that after April 8th, 2024, all labs will be drawn in the cancer center.  You do not have to check in or register with the main entrance as you have in the past but will complete your check-in in the cancer center.  Wear comfortable clothing and clothing appropriate for easy access to any Portacath or PICC line.   We strive to give you quality time with your provider. You may need to reschedule your appointment if you arrive late (15 or more minutes).  Arriving late affects you and other patients whose appointments are after yours.  Also, if you miss three or more appointments without notifying the office, you may be dismissed from the clinic at the provider's discretion.      For prescription refill requests, have your pharmacy contact our office and allow 72 hours for refills to be completed.    Today you received the following chemotherapy and/or immunotherapy agents Vidaza      To help prevent nausea and vomiting after your treatment, we encourage you to take your nausea medication as directed.  BELOW ARE SYMPTOMS THAT SHOULD BE REPORTED IMMEDIATELY: *FEVER GREATER THAN 100.4 F (38 C) OR HIGHER *CHILLS OR SWEATING *NAUSEA AND VOMITING THAT IS NOT CONTROLLED WITH YOUR NAUSEA MEDICATION *UNUSUAL SHORTNESS OF BREATH *UNUSUAL BRUISING OR BLEEDING *URINARY PROBLEMS (pain or burning when urinating, or frequent urination) *BOWEL PROBLEMS (unusual diarrhea, constipation, pain near the anus) TENDERNESS IN MOUTH AND THROAT WITH OR WITHOUT PRESENCE OF ULCERS (sore throat, sores in mouth, or a toothache) UNUSUAL RASH, SWELLING OR PAIN  UNUSUAL VAGINAL DISCHARGE OR ITCHING   Items with * indicate a potential emergency and should be followed up as  soon as possible or go to the Emergency Department if any problems should occur.  Please show the CHEMOTHERAPY ALERT CARD or IMMUNOTHERAPY ALERT CARD at check-in to the Emergency Department and triage nurse.  Should you have questions after your visit or need to cancel or reschedule your appointment, please contact [facility] CANCER CTR [place] - A DEPT OF Eligha Bridegroom [facility] [phone]  and follow the prompts.  Office hours are 8:00 a.m. to 4:30 p.m. Monday - Friday. Please note that voicemails left after 4:00 p.m. may not be returned until the following business day.  We are closed weekends and major holidays. You have access to a nurse at all times for urgent questions. Please call the main number to the clinic [phone] and follow the prompts.  For any non-urgent questions, you may also contact your provider using MyChart. We now offer e-Visits for anyone 72 and older to request care online for non-urgent symptoms. For details visit [email or web address].   Also download the MyChart app! Go to the app store, search "MyChart", open the app, select [place], and log in with your MyChart username and password.

## 2024-06-19 ENCOUNTER — Other Ambulatory Visit: Payer: Self-pay

## 2024-06-19 ENCOUNTER — Ambulatory Visit (HOSPITAL_COMMUNITY): Admission: RE | Admit: 2024-06-19 | Discharge: 2024-06-19 | Attending: Oncology

## 2024-06-19 ENCOUNTER — Encounter: Payer: Self-pay | Admitting: Oncology

## 2024-06-19 DIAGNOSIS — Z79631 Long term (current) use of antimetabolite agent: Secondary | ICD-10-CM | POA: Insufficient documentation

## 2024-06-19 DIAGNOSIS — M069 Rheumatoid arthritis, unspecified: Secondary | ICD-10-CM | POA: Insufficient documentation

## 2024-06-19 DIAGNOSIS — Z7982 Long term (current) use of aspirin: Secondary | ICD-10-CM | POA: Insufficient documentation

## 2024-06-19 DIAGNOSIS — J449 Chronic obstructive pulmonary disease, unspecified: Secondary | ICD-10-CM | POA: Insufficient documentation

## 2024-06-19 DIAGNOSIS — I2699 Other pulmonary embolism without acute cor pulmonale: Secondary | ICD-10-CM

## 2024-06-19 DIAGNOSIS — C9310 Chronic myelomonocytic leukemia not having achieved remission: Secondary | ICD-10-CM | POA: Diagnosis present

## 2024-06-19 HISTORY — PX: IR IMAGING GUIDED PORT INSERTION: IMG5740

## 2024-06-19 MED ORDER — HEPARIN SOD (PORK) LOCK FLUSH 100 UNIT/ML IV SOLN
INTRAVENOUS | Status: AC
  Filled 2024-06-19: qty 5

## 2024-06-19 MED ORDER — LIDOCAINE HCL 1 % IJ SOLN
INTRAMUSCULAR | Status: AC
  Filled 2024-06-19: qty 20

## 2024-06-19 MED ORDER — HEPARIN SOD (PORK) LOCK FLUSH 100 UNIT/ML IV SOLN
500.0000 [IU] | Freq: Once | INTRAVENOUS | Status: AC
  Administered 2024-06-19: 12:00:00 500 [IU]

## 2024-06-19 MED ORDER — FENTANYL CITRATE (PF) 100 MCG/2ML IJ SOLN
INTRAMUSCULAR | Status: AC
  Filled 2024-06-19: qty 2

## 2024-06-19 MED ORDER — MIDAZOLAM HCL 2 MG/2ML IJ SOLN
INTRAMUSCULAR | Status: AC
  Filled 2024-06-19: qty 2

## 2024-06-19 MED ORDER — CEFAZOLIN SODIUM-DEXTROSE 2-4 GM/100ML-% IV SOLN
INTRAVENOUS | Status: AC | PRN
  Administered 2024-06-19: 11:00:00 2 g via INTRAVENOUS

## 2024-06-19 MED ORDER — LIDOCAINE HCL 1 % IJ SOLN
20.0000 mL | Freq: Once | INTRAMUSCULAR | Status: AC
  Administered 2024-06-19: 12:00:00 20 mL via INTRADERMAL

## 2024-06-19 MED ORDER — CEFAZOLIN SODIUM-DEXTROSE 2-4 GM/100ML-% IV SOLN
INTRAVENOUS | Status: AC
  Filled 2024-06-19: qty 100

## 2024-06-19 MED ORDER — MIDAZOLAM HCL (PF) 2 MG/2ML IJ SOLN
INTRAMUSCULAR | Status: AC | PRN
  Administered 2024-06-19 (×2): 1 mg via INTRAVENOUS

## 2024-06-19 MED ORDER — SODIUM CHLORIDE 0.9 % IV SOLN: INTRAVENOUS | Status: DC

## 2024-06-19 MED ORDER — FENTANYL CITRATE (PF) 100 MCG/2ML IJ SOLN
INTRAMUSCULAR | Status: AC | PRN
  Administered 2024-06-19 (×2): 50 ug via INTRAVENOUS

## 2024-06-19 NOTE — H&P (Signed)
 Chief Complaint: Request for image guided port placement  Referring Provider(s): Davonna Siad   Supervising Physician: Luverne Aran  Patient Status: [facility] - In-pt  History of Present Illness: Laurie Wallace is a 78 y.o. female with past medical history of COPD, rheumatoid arthritis, PE this year, lymphoproliferative malignancy. She is known to IR from previous bone marrow biopsy 05/03/24 with Dr. Vanice. Tolerated 1.5 mg IV Versed , 50 mcg IV Fentanyl   well in that procedure.   She has been referred to IR for port placement to begin palliative chemotherapy for leukemia.   Confirms NPO since MN and ride/supervision available for 24 hours.  Denies fever, chills, SOB, CP, sore throat, N/V, abd pain, blood in stool or urine, abnormal bruising, leg swelling, back pain.   Allergies Reviewed:  Patient has no known allergies.    Patient is Full Code  Past Medical History:  Diagnosis Date   Allergy    Anemia    Iron  Deficiency   Arthritis    Rheumatoid Arthritis   Family history of adverse reaction to anesthesia    Sister has N/V   GERD (gastroesophageal reflux disease)    Peripheral vascular disease    Pneumonia     Past Surgical History:  Procedure Laterality Date   ABDOMINAL HYSTERECTOMY  2000   IR BONE MARROW BIOPSY & ASPIRATION  05/03/2024   THORACIC AORTIC ENDOVASCULAR STENT GRAFT N/A 04/12/2024   Procedure: INSERTION, ENDOVASCULAR STENT GRAFT, AORTA, THORACIC;  Surgeon: Magda Debby SAILOR, MD;  Location: [facility] OR;  Service: Vascular;  Laterality: N/A;   TOE SURGERY Right 2015   1st toe   ULTRASOUND GUIDANCE FOR VASCULAR ACCESS Right 04/12/2024   Procedure: ULTRASOUND GUIDANCE, FOR VASCULAR ACCESS;  Surgeon: Magda Debby SAILOR, MD;  Location: [facility] OR;  Service: Vascular;  Laterality: Right;      Medications: Prior to Admission medications  Medication Sig Start Date End Date Taking? Authorizing Provider  apixaban  (ELIQUIS ) 5 MG TABS tablet Take 1 tablet (5  mg total) by mouth 2 (two) times daily. 06/07/24  Yes Davonna Siad, MD  aspirin  EC 81 MG tablet Take 81 mg by mouth daily. Swallow whole.   Yes [provider]  famotidine  (PEPCID ) 40 MG tablet TAKE ONE TABLET BY MOUTH DAILY 04/10/24  Yes [name], MD  folic acid  (FOLVITE ) 1 MG tablet Take 1 tablet (1 mg total) by mouth daily. 12/23/23  Yes Rice, Lonni ORN, MD  methotrexate  (RHEUMATREX) 2.5 MG tablet Take 6 tablets (15 mg total) by mouth once a week. Caution:Chemotherapy. Protect from light. 03/27/24  Yes Rice, Lonni ORN, MD  ondansetron  (ZOFRAN ) 4 MG tablet Take 1 tablet (4 mg total) by mouth every 8 (eight) hours as needed for nausea or vomiting. 05/12/24  Yes [name], MD  ondansetron  (ZOFRAN ) 8 MG tablet Take 1 tablet (8 mg total) by mouth every 8 (eight) hours as needed for nausea or vomiting. 06/13/24  Yes Kandala, Hyndavi, MD  prochlorperazine  (COMPAZINE ) 10 MG tablet Take 1 tablet (10 mg total) by mouth every 6 (six) hours as needed for nausea or vomiting. 06/13/24  Yes Davonna Siad, MD  allopurinol  (ZYLOPRIM ) 100 MG tablet Take 1 tablet (100 mg total) by mouth daily. 06/15/24   Geofm Delon BRAVO, NP  azaCITIDine  5 mg/2 mLs in lactated ringers  infusion Inject into the vein every 28 (twenty-eight) days.    [provider]  hydrOXYzine  (ATARAX ) 10 MG tablet Take 1 tablet (10 mg total) by mouth 2 (two) times daily  as needed for anxiety. 05/23/24   [name], MD  lidocaine -prilocaine  (EMLA ) cream Apply to affected area once 06/13/24   Kandala, Hyndavi, MD  brompheniramine (VAZOL) 2 MG/5ML LIQD Take 2 mg by mouth 2 (two) times daily.  07/23/20  [provider]     Family History  Problem Relation Age of Onset   Cancer Mother    Cancer Father    Heart failure Sister    COPD Sister    Dementia Sister     Social History   Socioeconomic History   Marital status: Single    Spouse name: Not on file   Number of children: Not on file    Years of education: Not on file   Highest education level: Not on file  Occupational History   Occupation: full time  Tobacco Use   Smoking status: Never    Passive exposure: Never   Smokeless tobacco: Never  Vaping Use   Vaping status: Never Used  Substance and Sexual Activity   Alcohol use: No   Drug use: No   Sexual activity: Not Currently  Other Topics Concern   Not on file  Social History Narrative   Lives with her son   Right Handed   Drinks 1-2 cups caffeine daily in winter   Social Drivers of Health   Tobacco Use: Low Risk (06/14/2024)   Patient History    Smoking Tobacco Use: Never    Smokeless Tobacco Use: Never    Passive Exposure: Never  Financial Resource Strain: Low Risk (06/14/2023)   Overall Financial Resource Strain (CARDIA)    Difficulty of Paying Living Expenses: Not hard at all  Food Insecurity: No Food Insecurity (06/14/2024)   Epic    Worried About Programme Researcher, Broadcasting/film/video in the Last Year: Never true    Ran Out of Food in the Last Year: Never true  Transportation Needs: No Transportation Needs (06/14/2024)   Epic    Lack of Transportation (Medical): No    Lack of Transportation (Non-Medical): No  Physical Activity: Sufficiently Active (06/14/2024)   Exercise Vital Sign    Days of Exercise per Week: 7 days    Minutes of Exercise per Session: 30 min  Stress: No Stress Concern Present (06/14/2024)   [employer] of Occupational Health - Occupational Stress Questionnaire    Feeling of Stress: Only a little  Social Connections: Moderately Isolated (06/14/2024)   Social Connection and Isolation Panel    Frequency of Communication with Friends and Family: More than three times a week    Frequency of Social Gatherings with Friends and Family: More than three times a week    Attends Religious Services: More than 4 times per year    Active Member of [employer] or Organizations: No    Attends Banker Meetings: Never    Marital Status: Widowed   Depression (PHQ2-9): Low Risk (06/15/2024)   Depression (PHQ2-9)    PHQ-2 Score: 0  Alcohol Screen: Low Risk (06/14/2023)   Alcohol Screen    Last Alcohol Screening Score (AUDIT): 0  Housing: Low Risk (06/14/2024)   Epic    Unable to Pay for Housing in the Last Year: No    Number of Times Moved in the Last Year: 0    Homeless in the Last Year: No  Utilities: Not At Risk (06/14/2024)   Epic    Threatened with loss of utilities: No  Health Literacy: Adequate Health Literacy (06/14/2024)   B1300 Health Literacy  Frequency of need for help with medical instructions: Never     Review of Systems: A 12 point ROS discussed and pertinent positives are indicated in the HPI above.  All other systems are negative.    Vital Signs: BP 134/60   Pulse 92   Temp 98.6 F (37 C)   Resp 16   Ht 5' 6 (1.676 m)   Wt 117 lb (53.1 kg)   SpO2 100%   BMI 18.88 kg/m     Physical Exam HENT:     Mouth/Throat:     Mouth: Mucous membranes are moist.     Pharynx: Oropharynx is clear.  Cardiovascular:     Rate and Rhythm: Normal rate and regular rhythm.     Pulses: Normal pulses.     Heart sounds: Normal heart sounds.  Pulmonary:     Effort: Pulmonary effort is normal.     Breath sounds: Normal breath sounds.  Abdominal:     General: There is no distension.     Palpations: Abdomen is soft.     Comments: Minimal RLQ tenderness, tells me this is baseline for her  Musculoskeletal:     Right lower leg: No edema.     Left lower leg: No edema.  Skin:    General: Skin is warm and dry.     Comments: No rash or wound R neck or R chest  Neurological:     Mental Status: She is alert and oriented to person, place, and time.  Psychiatric:        Mood and Affect: Mood normal.        Behavior: Behavior normal.     Imaging: No results found.  Labs:  CBC: Recent Labs    05/30/24 0805 06/05/24 1257 06/08/24 1117 06/13/24 1144  WBC 88.5* 48.4* 33.1* 38.3*  HGB 7.0* 8.7* 9.0* 9.0*   HCT 21.7* 27.3* 28.6* 29.0*  PLT 1,275* 845* 672* 635*    COAGS: Recent Labs    09/29/23 1739 04/11/24 1400 04/12/24 1110 04/28/24 1955  INR 1.1 1.3*  --  2.1*  APTT  --  34 42*  --     BMP: Recent Labs    05/30/24 0805 06/05/24 1257 06/08/24 1117 06/13/24 1144  NA 141 140 138 142  K 4.5 4.1 4.8 4.1  CL 106 106 104 108  CO2 25 23 22  21*  GLUCOSE 82 84 103* 86  BUN 6* 8 11 8   CALCIUM 9.4 9.1 9.0 9.2  CREATININE 1.05* 1.13* 1.12* 1.01*  GFRNONAA 54* 50* 50* 57*    LIVER FUNCTION TESTS: Recent Labs    05/30/24 0805 06/05/24 1257 06/08/24 1117 06/13/24 1144  BILITOT 0.3 0.3 0.4 0.4  AST 24 26 28 27   ALT 11 10 16 16   ALKPHOS 114 100 100 100  PROT 7.0 6.8 6.8 7.0  ALBUMIN  4.5 4.4 4.2 4.4    TUMOR MARKERS: No results for input(s): AFPTM, CEA, CA199, CHROMGRNA in the last 8760 hours.  Assessment and Plan:  Request for  image guided port placement approved for 12/15. No contraindications for procedure identified in ROS, physical exam, or review of pre-sedation considerations. Labs reviewed and within acceptable range (WBC chronically elevated d/t condition, no concern for acute infection) VSS, afebrile Patient not asked to hold any AC/AP for this low bleeding risk procedure  Risks and benefits of image guided port-a-catheter placement was discussed with the patient including, but not limited to bleeding, infection, pneumothorax, or fibrin sheath development and need for additional  procedures.  All of the patient's questions were answered, patient is agreeable to proceed. Consent signed and in chart.   Thank you for allowing our service to participate in Laurie Wallace 's care.    Electronically Signed: Laymon Coast, NP   06/19/2024, 10:56 AM     I spent a total of   10 Minutes in face to face in clinical consultation, greater than 50% of which was counseling/coordinating care for image guided port insertion   (A copy of  this note was sent to the referring provider and the time of visit.)

## 2024-06-19 NOTE — Procedures (Signed)
 Interventional Radiology Procedure Note  Procedure: Single Lumen Power Port Placement    Access:  Right IJ vein.  Findings: Catheter tip positioned at SVC/RA junction. Port is ready for immediate use.   Complications: None  EBL: < 10 mL  Recommendations:  - Ok to shower in 24 hours - Do not submerge for 7 days - Routine line care   Maxi Carreras T. Fredia Sorrow, M.D Pager:  [phone]

## 2024-06-20 ENCOUNTER — Inpatient Hospital Stay

## 2024-06-20 VITALS — BP 110/60 | HR 94 | Temp 98.3°F | Resp 18

## 2024-06-20 VITALS — BP 124/52 | HR 91 | Temp 98.3°F | Resp 18

## 2024-06-20 DIAGNOSIS — Z5111 Encounter for antineoplastic chemotherapy: Secondary | ICD-10-CM | POA: Diagnosis not present

## 2024-06-20 DIAGNOSIS — C9310 Chronic myelomonocytic leukemia not having achieved remission: Secondary | ICD-10-CM

## 2024-06-20 LAB — CBC WITH DIFFERENTIAL/PLATELET
Abs Immature Granulocytes: 1.87 K/uL — ABNORMAL HIGH (ref 0.00–0.07)
Basophils Absolute: 0.3 K/uL — ABNORMAL HIGH (ref 0.0–0.1)
Basophils Relative: 1 %
Eosinophils Absolute: 0.6 K/uL — ABNORMAL HIGH (ref 0.0–0.5)
Eosinophils Relative: 2 %
HCT: 24.6 % — ABNORMAL LOW (ref 36.0–46.0)
Hemoglobin: 8 g/dL — ABNORMAL LOW (ref 12.0–15.0)
Immature Granulocytes: 6 %
Lymphocytes Relative: 19 %
Lymphs Abs: 5.8 K/uL — ABNORMAL HIGH (ref 0.7–4.0)
MCH: 32 pg (ref 26.0–34.0)
MCHC: 32.5 g/dL (ref 30.0–36.0)
MCV: 98.4 fL (ref 80.0–100.0)
Monocytes Absolute: 7.3 K/uL — ABNORMAL HIGH (ref 0.1–1.0)
Monocytes Relative: 23 %
Neutro Abs: 15.6 K/uL — ABNORMAL HIGH (ref 1.7–7.7)
Neutrophils Relative %: 49 %
Platelets: 556 K/uL — ABNORMAL HIGH (ref 150–400)
RBC: 2.5 MIL/uL — ABNORMAL LOW (ref 3.87–5.11)
RDW: 20.5 % — ABNORMAL HIGH (ref 11.5–15.5)
WBC: 31.4 K/uL — ABNORMAL HIGH (ref 4.0–10.5)
nRBC: 0.2 % (ref 0.0–0.2)

## 2024-06-20 LAB — COMPREHENSIVE METABOLIC PANEL WITH GFR
ALT: 16 U/L (ref 0–44)
AST: 21 U/L (ref 15–41)
Albumin: 4.3 g/dL (ref 3.5–5.0)
Alkaline Phosphatase: 107 U/L (ref 38–126)
Anion gap: 14 (ref 5–15)
BUN: 7 mg/dL — ABNORMAL LOW (ref 8–23)
CO2: 22 mmol/L (ref 22–32)
Calcium: 9.1 mg/dL (ref 8.9–10.3)
Chloride: 100 mmol/L (ref 98–111)
Creatinine, Ser: 0.96 mg/dL (ref 0.44–1.00)
GFR, Estimated: >60 mL/min (ref >60)
Glucose, Bld: 92 mg/dL (ref 70–99)
Potassium: 3.9 mmol/L (ref 3.5–5.1)
Sodium: 136 mmol/L (ref 135–145)
Total Bilirubin: 0.5 mg/dL (ref 0.0–1.2)
Total Protein: 6.8 g/dL (ref 6.5–8.1)

## 2024-06-20 LAB — MAGNESIUM: Magnesium: 1.9 mg/dL (ref 1.7–2.4)

## 2024-06-20 LAB — SAMPLE TO BLOOD BANK

## 2024-06-20 MED ORDER — ONDANSETRON HCL 4 MG/2ML IJ SOLN
8.0000 mg | Freq: Once | INTRAMUSCULAR | Status: AC
  Administered 2024-06-20: 14:00:00 8 mg via INTRAVENOUS
  Filled 2024-06-20: qty 4

## 2024-06-20 MED ORDER — SODIUM CHLORIDE 0.9 % IV SOLN: INTRAVENOUS | Status: DC

## 2024-06-20 MED ORDER — SODIUM CHLORIDE 0.9 % IV SOLN
75.0000 mg/m2 | Freq: Once | INTRAVENOUS | Status: AC
  Administered 2024-06-20: 14:00:00 116 mg via INTRAVENOUS
  Filled 2024-06-20: qty 11.6

## 2024-06-20 MED FILL — Azacitidine For Inj 100 MG: INTRAMUSCULAR | Qty: 11.6 | Status: AC

## 2024-06-20 NOTE — Patient Instructions (Signed)
 [facility] [place] [facility]  Discharge Instructions: Thank you for choosing [facility] to provide your oncology and hematology care.  If you have a lab appointment with the Cancer Center - please note that after April 8th, 2024, all labs will be drawn in the cancer center.  You do not have to check in or register with the main entrance as you have in the past but will complete your check-in in the cancer center.  Wear comfortable clothing and clothing appropriate for easy access to any Portacath or PICC line.   We strive to give you quality time with your provider. You may need to reschedule your appointment if you arrive late (15 or more minutes).  Arriving late affects you and other patients whose appointments are after yours.  Also, if you miss three or more appointments without notifying the office, you may be dismissed from the clinic at the provider's discretion.      For prescription refill requests, have your pharmacy contact our office and allow 72 hours for refills to be completed.    Today you received the following chemotherapy and/or immunotherapy agents Vidaza      To help prevent nausea and vomiting after your treatment, we encourage you to take your nausea medication as directed.  BELOW ARE SYMPTOMS THAT SHOULD BE REPORTED IMMEDIATELY: *FEVER GREATER THAN 100.4 F (38 C) OR HIGHER *CHILLS OR SWEATING *NAUSEA AND VOMITING THAT IS NOT CONTROLLED WITH YOUR NAUSEA MEDICATION *UNUSUAL SHORTNESS OF BREATH *UNUSUAL BRUISING OR BLEEDING *URINARY PROBLEMS (pain or burning when urinating, or frequent urination) *BOWEL PROBLEMS (unusual diarrhea, constipation, pain near the anus) TENDERNESS IN MOUTH AND THROAT WITH OR WITHOUT PRESENCE OF ULCERS (sore throat, sores in mouth, or a toothache) UNUSUAL RASH, SWELLING OR PAIN  UNUSUAL VAGINAL DISCHARGE OR ITCHING   Items with * indicate a potential emergency and should be followed up as  soon as possible or go to the Emergency Department if any problems should occur.  Please show the CHEMOTHERAPY ALERT CARD or IMMUNOTHERAPY ALERT CARD at check-in to the Emergency Department and triage nurse.  Should you have questions after your visit or need to cancel or reschedule your appointment, please contact [facility] CANCER CTR [place] - A DEPT OF Eligha Bridegroom [facility] [phone]  and follow the prompts.  Office hours are 8:00 a.m. to 4:30 p.m. Monday - Friday. Please note that voicemails left after 4:00 p.m. may not be returned until the following business day.  We are closed weekends and major holidays. You have access to a nurse at all times for urgent questions. Please call the main number to the clinic [phone] and follow the prompts.  For any non-urgent questions, you may also contact your provider using MyChart. We now offer e-Visits for anyone 72 and older to request care online for non-urgent symptoms. For details visit [email or web address].   Also download the MyChart app! Go to the app store, search "MyChart", open the app, select [place], and log in with your MyChart username and password.

## 2024-06-20 NOTE — Progress Notes (Signed)
 Patient presents today for chemotherapy Vidaza  infusion.  Patient is in satisfactory condition with no new complaints voiced.  Vital signs are stable.  Labs reviewed and all labs are within treatment parameters.  We will proceed with treatment per MD orders.    Treatment given today per MD orders. Tolerated infusion without adverse affects. Vital signs stable. No complaints at this time. Discharged from clinic ambulatory in stable condition. Alert and oriented x 3. F/U with [facility] as scheduled.

## 2024-06-21 ENCOUNTER — Inpatient Hospital Stay

## 2024-06-21 VITALS — BP 118/49 | HR 86 | Temp 98.6°F | Resp 18

## 2024-06-21 DIAGNOSIS — Z5111 Encounter for antineoplastic chemotherapy: Secondary | ICD-10-CM | POA: Diagnosis not present

## 2024-06-21 DIAGNOSIS — C9310 Chronic myelomonocytic leukemia not having achieved remission: Secondary | ICD-10-CM

## 2024-06-21 MED ORDER — SODIUM CHLORIDE 0.9 % IV SOLN: INTRAVENOUS | Status: DC

## 2024-06-21 MED ORDER — ONDANSETRON HCL 4 MG/2ML IJ SOLN
8.0000 mg | Freq: Once | INTRAMUSCULAR | Status: AC
  Administered 2024-06-21: 14:00:00 8 mg via INTRAVENOUS
  Filled 2024-06-21: qty 4

## 2024-06-21 MED ORDER — SODIUM CHLORIDE 0.9 % IV SOLN
75.0000 mg/m2 | Freq: Once | INTRAVENOUS | Status: AC
  Administered 2024-06-21: 14:00:00 116 mg via INTRAVENOUS
  Filled 2024-06-21: qty 11.6

## 2024-06-21 NOTE — Progress Notes (Signed)
Patient presents today for Vidaza infusion per providers order.  Vital signs within parameters for treatment.  Patient has no new complaints at this time.  Treatment given today per MD orders.  Stable during infusion without adverse affects.  Vital signs stable.  No complaints at this time.  Discharge from clinic ambulatory in stable condition.  Alert and oriented X 3.  Follow up with [facility] as scheduled.

## 2024-06-21 NOTE — Patient Instructions (Signed)
 [facility] [place] [facility]  Discharge Instructions: Thank you for choosing [facility] to provide your oncology and hematology care.  If you have a lab appointment with the Cancer Center - please note that after April 8th, 2024, all labs will be drawn in the cancer center.  You do not have to check in or register with the main entrance as you have in the past but will complete your check-in in the cancer center.  Wear comfortable clothing and clothing appropriate for easy access to any Portacath or PICC line.   We strive to give you quality time with your provider. You may need to reschedule your appointment if you arrive late (15 or more minutes).  Arriving late affects you and other patients whose appointments are after yours.  Also, if you miss three or more appointments without notifying the office, you may be dismissed from the clinic at the provider's discretion.      For prescription refill requests, have your pharmacy contact our office and allow 72 hours for refills to be completed.    Today you received the following chemotherapy and/or immunotherapy agents Vidaza      To help prevent nausea and vomiting after your treatment, we encourage you to take your nausea medication as directed.  BELOW ARE SYMPTOMS THAT SHOULD BE REPORTED IMMEDIATELY: *FEVER GREATER THAN 100.4 F (38 C) OR HIGHER *CHILLS OR SWEATING *NAUSEA AND VOMITING THAT IS NOT CONTROLLED WITH YOUR NAUSEA MEDICATION *UNUSUAL SHORTNESS OF BREATH *UNUSUAL BRUISING OR BLEEDING *URINARY PROBLEMS (pain or burning when urinating, or frequent urination) *BOWEL PROBLEMS (unusual diarrhea, constipation, pain near the anus) TENDERNESS IN MOUTH AND THROAT WITH OR WITHOUT PRESENCE OF ULCERS (sore throat, sores in mouth, or a toothache) UNUSUAL RASH, SWELLING OR PAIN  UNUSUAL VAGINAL DISCHARGE OR ITCHING   Items with * indicate a potential emergency and should be followed up as  soon as possible or go to the Emergency Department if any problems should occur.  Please show the CHEMOTHERAPY ALERT CARD or IMMUNOTHERAPY ALERT CARD at check-in to the Emergency Department and triage nurse.  Should you have questions after your visit or need to cancel or reschedule your appointment, please contact [facility] CANCER CTR [place] - A DEPT OF Eligha Bridegroom [facility] [phone]  and follow the prompts.  Office hours are 8:00 a.m. to 4:30 p.m. Monday - Friday. Please note that voicemails left after 4:00 p.m. may not be returned until the following business day.  We are closed weekends and major holidays. You have access to a nurse at all times for urgent questions. Please call the main number to the clinic [phone] and follow the prompts.  For any non-urgent questions, you may also contact your provider using MyChart. We now offer e-Visits for anyone 72 and older to request care online for non-urgent symptoms. For details visit [email or web address].   Also download the MyChart app! Go to the app store, search "MyChart", open the app, select [place], and log in with your MyChart username and password.

## 2024-06-22 ENCOUNTER — Inpatient Hospital Stay

## 2024-06-22 VITALS — BP 110/48 | HR 88 | Temp 97.1°F | Resp 18

## 2024-06-22 DIAGNOSIS — C9310 Chronic myelomonocytic leukemia not having achieved remission: Secondary | ICD-10-CM

## 2024-06-22 DIAGNOSIS — Z5111 Encounter for antineoplastic chemotherapy: Secondary | ICD-10-CM | POA: Diagnosis not present

## 2024-06-22 MED ORDER — SODIUM CHLORIDE 0.9 % IV SOLN
75.0000 mg/m2 | Freq: Once | INTRAVENOUS | Status: AC
  Administered 2024-06-22: 10:00:00 116 mg via INTRAVENOUS
  Filled 2024-06-22 (×2): qty 11.6

## 2024-06-22 MED ORDER — SODIUM CHLORIDE 0.9 % IV SOLN: INTRAVENOUS | Status: DC

## 2024-06-22 MED ORDER — ONDANSETRON HCL 4 MG/2ML IJ SOLN
8.0000 mg | Freq: Once | INTRAMUSCULAR | Status: AC
  Administered 2024-06-22: 09:00:00 8 mg via INTRAVENOUS
  Filled 2024-06-22: qty 4

## 2024-06-22 NOTE — Patient Instructions (Signed)
 [facility] [place] - A DEPT OF [facility]  Discharge Instructions: Thank you for choosing [facility] to provide your oncology and hematology care.  If you have a lab appointment with the Cancer Center - please note that after April 8th, 2024, all labs will be drawn in the cancer center.  You do not have to check in or register with the main entrance as you have in the past but will complete your check-in in the cancer center.  Wear comfortable clothing and clothing appropriate for easy access to any Portacath or PICC line.   We strive to give you quality time with your provider. You may need to reschedule your appointment if you arrive late (15 or more minutes).  Arriving late affects you and other patients whose appointments are after yours.  Also, if you miss three or more appointments without notifying the office, you may be dismissed from the clinic at the provider's discretion.      For prescription refill requests, have your pharmacy contact our office and allow 72 hours for refills to be completed.    Today you received the following chemotherapy and/or immunotherapy agents azacitadine   To help prevent nausea and vomiting after your treatment, we encourage you to take your nausea medication as directed.  BELOW ARE SYMPTOMS THAT SHOULD BE REPORTED IMMEDIATELY: *FEVER GREATER THAN 100.4 F (38 C) OR HIGHER *CHILLS OR SWEATING *NAUSEA AND VOMITING THAT IS NOT CONTROLLED WITH YOUR NAUSEA MEDICATION *UNUSUAL SHORTNESS OF BREATH *UNUSUAL BRUISING OR BLEEDING *URINARY PROBLEMS (pain or burning when urinating, or frequent urination) *BOWEL PROBLEMS (unusual diarrhea, constipation, pain near the anus) TENDERNESS IN MOUTH AND THROAT WITH OR WITHOUT PRESENCE OF ULCERS (sore throat, sores in mouth, or a toothache) UNUSUAL RASH, SWELLING OR PAIN  UNUSUAL VAGINAL DISCHARGE OR ITCHING   Items with * indicate a potential emergency and should be followed up  as soon as possible or go to the Emergency Department if any problems should occur.  Please show the CHEMOTHERAPY ALERT CARD or IMMUNOTHERAPY ALERT CARD at check-in to the Emergency Department and triage nurse.  Should you have questions after your visit or need to cancel or reschedule your appointment, please contact [facility] [place] - A DEPT OF [facility] [phone]  and follow the prompts.  Office hours are 8:00 a.m. to 4:30 p.m. Monday - Friday. Please note that voicemails left after 4:00 p.m. may not be returned until the following business day.  We are closed weekends and major holidays. You have access to a nurse at all times for urgent questions. Please call the main number to the clinic [phone] and follow the prompts.  For any non-urgent questions, you may also contact your provider using MyChart. We now offer e-Visits for anyone 46 and older to request care online for non-urgent symptoms. For details visit [email or web address].   Also download the MyChart app! Go to the app store, search "MyChart", open the app, select [place], and log in with your MyChart username and password.

## 2024-06-22 NOTE — Progress Notes (Signed)
Treatment given per orders. Patient tolerated it well without problems. Vitals stable and discharged home from clinic via wheelchair Follow up as scheduled.  

## 2024-06-26 ENCOUNTER — Inpatient Hospital Stay: Admitting: Dietician

## 2024-06-26 ENCOUNTER — Telehealth: Payer: Self-pay | Admitting: Dietician

## 2024-06-26 ENCOUNTER — Ambulatory Visit: Admitting: Internal Medicine

## 2024-06-26 DIAGNOSIS — Z79899 Other long term (current) drug therapy: Secondary | ICD-10-CM

## 2024-06-26 DIAGNOSIS — M059 Rheumatoid arthritis with rheumatoid factor, unspecified: Secondary | ICD-10-CM

## 2024-06-26 NOTE — Telephone Encounter (Signed)
 Nutrition Assessment  Reason for Assessment:    ASSESSMENT: 78 year old female with myelodysplasia. She is receiving azacitidine  q28d. Patient under the care of Dr. Davonna  Past medical history includes pulmonary embolism, aortic thrombus s/p stent, GERD, infectious colitis, GAD  Spoke with patient via telephone. She is doing well today. Patient tolerating therapy well besides occasional nausea in the evening and first thing in the morning. Takes antiemetics as needed which work well. Patient has a good appetite. Says she eats all day long. Recalls eggs, bacon, applesauce for breakfast. Had cabbage, pintos, greens, ham for lunch. Patient drinking lots of water. States she goes through a 24 pack in 5-6 days.    Nutrition Focused Physical Exam: deferred - telephone visit    Medications: zyloprim , eliquis , pepcid , folvite , hydroxyzine , methotrexate , compazine    Labs: 12/16 - labs reviewed    Anthropometrics:   Height: 5'6 Weight: 117 lb (12/3) UBW: 114-120 lb (last 16 months)  BMI: 18.88   NUTRITION DIAGNOSIS: Food and nutrition related knowledge deficit related to CML as evidenced by no prior need for associated information    INTERVENTION:  Encourage high protein snacks in between meals  Antiemetics as needed for occasional nausea  MONITORING, EVALUATION, GOAL: Pt will tolerate increased calories and protein to support wt maintenance/gain    Next Visit: To be scheduled as needed

## 2024-07-03 ENCOUNTER — Inpatient Hospital Stay

## 2024-07-03 ENCOUNTER — Encounter: Payer: Self-pay | Admitting: *Deleted

## 2024-07-03 ENCOUNTER — Inpatient Hospital Stay: Admitting: Oncology

## 2024-07-03 ENCOUNTER — Inpatient Hospital Stay: Admitting: Dietician

## 2024-07-04 ENCOUNTER — Inpatient Hospital Stay

## 2024-07-05 ENCOUNTER — Inpatient Hospital Stay

## 2024-07-07 ENCOUNTER — Inpatient Hospital Stay

## 2024-07-10 ENCOUNTER — Inpatient Hospital Stay

## 2024-07-10 ENCOUNTER — Inpatient Hospital Stay: Attending: Oncology

## 2024-07-10 ENCOUNTER — Encounter: Payer: Self-pay | Admitting: Oncology

## 2024-07-10 VITALS — BP 106/57 | HR 77 | Temp 97.0°F | Resp 18 | Wt 112.6 lb

## 2024-07-10 DIAGNOSIS — Z79899 Other long term (current) drug therapy: Secondary | ICD-10-CM | POA: Diagnosis not present

## 2024-07-10 DIAGNOSIS — R110 Nausea: Secondary | ICD-10-CM | POA: Diagnosis not present

## 2024-07-10 DIAGNOSIS — R634 Abnormal weight loss: Secondary | ICD-10-CM | POA: Diagnosis not present

## 2024-07-10 DIAGNOSIS — D75839 Thrombocytosis, unspecified: Secondary | ICD-10-CM | POA: Diagnosis present

## 2024-07-10 DIAGNOSIS — D539 Nutritional anemia, unspecified: Secondary | ICD-10-CM | POA: Diagnosis not present

## 2024-07-10 DIAGNOSIS — C9310 Chronic myelomonocytic leukemia not having achieved remission: Secondary | ICD-10-CM

## 2024-07-10 DIAGNOSIS — Z86711 Personal history of pulmonary embolism: Secondary | ICD-10-CM | POA: Insufficient documentation

## 2024-07-10 DIAGNOSIS — E883 Tumor lysis syndrome: Secondary | ICD-10-CM | POA: Insufficient documentation

## 2024-07-10 DIAGNOSIS — T451X5A Adverse effect of antineoplastic and immunosuppressive drugs, initial encounter: Secondary | ICD-10-CM | POA: Insufficient documentation

## 2024-07-10 DIAGNOSIS — Z5111 Encounter for antineoplastic chemotherapy: Secondary | ICD-10-CM | POA: Insufficient documentation

## 2024-07-10 DIAGNOSIS — E790 Hyperuricemia without signs of inflammatory arthritis and tophaceous disease: Secondary | ICD-10-CM | POA: Insufficient documentation

## 2024-07-10 DIAGNOSIS — Z79631 Long term (current) use of antimetabolite agent: Secondary | ICD-10-CM | POA: Insufficient documentation

## 2024-07-10 DIAGNOSIS — Z7982 Long term (current) use of aspirin: Secondary | ICD-10-CM | POA: Insufficient documentation

## 2024-07-10 DIAGNOSIS — Z7901 Long term (current) use of anticoagulants: Secondary | ICD-10-CM | POA: Insufficient documentation

## 2024-07-10 LAB — CBC WITH DIFFERENTIAL/PLATELET
Abs Immature Granulocytes: 1.2 K/uL — ABNORMAL HIGH (ref 0.00–0.07)
Band Neutrophils: 1 %
Basophils Absolute: 0 K/uL (ref 0.0–0.1)
Basophils Relative: 0 %
Blasts: 6 %
Eosinophils Absolute: 0.5 K/uL (ref 0.0–0.5)
Eosinophils Relative: 3 %
HCT: 28.1 % — ABNORMAL LOW (ref 36.0–46.0)
Hemoglobin: 8.6 g/dL — ABNORMAL LOW (ref 12.0–15.0)
Lymphocytes Relative: 37 %
Lymphs Abs: 5.6 K/uL — ABNORMAL HIGH (ref 0.7–4.0)
MCH: 31.6 pg (ref 26.0–34.0)
MCHC: 30.6 g/dL (ref 30.0–36.0)
MCV: 103.3 fL — ABNORMAL HIGH (ref 80.0–100.0)
Metamyelocytes Relative: 3 %
Monocytes Absolute: 0.9 K/uL (ref 0.1–1.0)
Monocytes Relative: 6 %
Myelocytes: 2 %
Neutro Abs: 6.1 K/uL (ref 1.7–7.7)
Neutrophils Relative %: 39 %
Platelets: 585 K/uL — ABNORMAL HIGH (ref 150–400)
Promyelocytes Relative: 3 %
RBC: 2.72 MIL/uL — ABNORMAL LOW (ref 3.87–5.11)
RDW: 21.5 % — ABNORMAL HIGH (ref 11.5–15.5)
WBC: 15.2 K/uL — ABNORMAL HIGH (ref 4.0–10.5)
nRBC: 0.5 % — ABNORMAL HIGH (ref 0.0–0.2)

## 2024-07-10 LAB — COMPREHENSIVE METABOLIC PANEL WITH GFR
ALT: 8 U/L (ref 0–44)
AST: 22 U/L (ref 15–41)
Albumin: 4 g/dL (ref 3.5–5.0)
Alkaline Phosphatase: 97 U/L (ref 38–126)
Anion gap: 7 (ref 5–15)
BUN: 8 mg/dL (ref 8–23)
CO2: 26 mmol/L (ref 22–32)
Calcium: 8.5 mg/dL — ABNORMAL LOW (ref 8.9–10.3)
Chloride: 107 mmol/L (ref 98–111)
Creatinine, Ser: 0.91 mg/dL (ref 0.44–1.00)
GFR, Estimated: >60 mL/min
Glucose, Bld: 89 mg/dL (ref 70–99)
Potassium: 4.1 mmol/L (ref 3.5–5.1)
Sodium: 140 mmol/L (ref 135–145)
Total Bilirubin: 0.4 mg/dL (ref 0.0–1.2)
Total Protein: 6.2 g/dL — ABNORMAL LOW (ref 6.5–8.1)

## 2024-07-10 LAB — MAGNESIUM: Magnesium: 2.2 mg/dL (ref 1.7–2.4)

## 2024-07-10 LAB — SAMPLE TO BLOOD BANK

## 2024-07-10 MED ORDER — SODIUM CHLORIDE 0.9 % IV SOLN
75.0000 mg/m2 | Freq: Once | INTRAVENOUS | Status: AC
  Administered 2024-07-10: 116 mg via INTRAVENOUS
  Filled 2024-07-10: qty 11.6

## 2024-07-10 MED ORDER — SODIUM CHLORIDE 0.9 % IV SOLN: INTRAVENOUS | Status: DC

## 2024-07-10 MED ORDER — ONDANSETRON HCL 4 MG/2ML IJ SOLN
8.0000 mg | Freq: Once | INTRAMUSCULAR | Status: AC
  Administered 2024-07-10: 8 mg via INTRAVENOUS
  Filled 2024-07-10: qty 4

## 2024-07-10 NOTE — Progress Notes (Signed)
 Patient presents today for Vidaza  infusion per providers order.  Vital signs and labs within parameters for treatment.    Treatment given today per MD orders.  Stable during infusion without adverse affects.  Vital signs stable.  No complaints at this time.  Discharge from clinic ambulatory in stable condition.  Alert and oriented X 3.  Follow up with [facility] as scheduled.

## 2024-07-10 NOTE — Patient Instructions (Signed)
 [facility] [place] [facility]  Discharge Instructions: Thank you for choosing [facility] to provide your oncology and hematology care.  If you have a lab appointment with the Cancer Center - please note that after April 8th, 2024, all labs will be drawn in the cancer center.  You do not have to check in or register with the main entrance as you have in the past but will complete your check-in in the cancer center.  Wear comfortable clothing and clothing appropriate for easy access to any Portacath or PICC line.   We strive to give you quality time with your provider. You may need to reschedule your appointment if you arrive late (15 or more minutes).  Arriving late affects you and other patients whose appointments are after yours.  Also, if you miss three or more appointments without notifying the office, you may be dismissed from the clinic at the provider's discretion.      For prescription refill requests, have your pharmacy contact our office and allow 72 hours for refills to be completed.    Today you received the following chemotherapy and/or immunotherapy agents Vidaza      To help prevent nausea and vomiting after your treatment, we encourage you to take your nausea medication as directed.  BELOW ARE SYMPTOMS THAT SHOULD BE REPORTED IMMEDIATELY: *FEVER GREATER THAN 100.4 F (38 C) OR HIGHER *CHILLS OR SWEATING *NAUSEA AND VOMITING THAT IS NOT CONTROLLED WITH YOUR NAUSEA MEDICATION *UNUSUAL SHORTNESS OF BREATH *UNUSUAL BRUISING OR BLEEDING *URINARY PROBLEMS (pain or burning when urinating, or frequent urination) *BOWEL PROBLEMS (unusual diarrhea, constipation, pain near the anus) TENDERNESS IN MOUTH AND THROAT WITH OR WITHOUT PRESENCE OF ULCERS (sore throat, sores in mouth, or a toothache) UNUSUAL RASH, SWELLING OR PAIN  UNUSUAL VAGINAL DISCHARGE OR ITCHING   Items with * indicate a potential emergency and should be followed up as  soon as possible or go to the Emergency Department if any problems should occur.  Please show the CHEMOTHERAPY ALERT CARD or IMMUNOTHERAPY ALERT CARD at check-in to the Emergency Department and triage nurse.  Should you have questions after your visit or need to cancel or reschedule your appointment, please contact [facility] CANCER CTR [place] - A DEPT OF Eligha Bridegroom [facility] [phone]  and follow the prompts.  Office hours are 8:00 a.m. to 4:30 p.m. Monday - Friday. Please note that voicemails left after 4:00 p.m. may not be returned until the following business day.  We are closed weekends and major holidays. You have access to a nurse at all times for urgent questions. Please call the main number to the clinic [phone] and follow the prompts.  For any non-urgent questions, you may also contact your provider using MyChart. We now offer e-Visits for anyone 72 and older to request care online for non-urgent symptoms. For details visit [email or web address].   Also download the MyChart app! Go to the app store, search "MyChart", open the app, select [place], and log in with your MyChart username and password.

## 2024-07-10 NOTE — Progress Notes (Signed)
 Pt presents to the cancer center for port flush/lab and treatment. Per patient she placed a Tegaderm dressing on her port on 06/29/24 due to the port rubbing her clothes . Today patient's port is red/bruised, swollen, tender to touch, small open sore on the right side of her incision above the port with a small amount of dark brown drainage. There was a dark brown drainage on Tegaderm when dressing was removed. Per pt she has not had any fevers or discomfort since her last treatment. Labs drawn peripherally. Dr Davonna made aware and assessed patient's port. Per Dr Davonna okay to access port for treatment. Treatment nurse made aware and pt agrees to accessing port for treatment. Port accessed without any complications and blood return noted. Patient stable.   Eamon Tantillo

## 2024-07-11 ENCOUNTER — Inpatient Hospital Stay

## 2024-07-11 ENCOUNTER — Inpatient Hospital Stay: Admitting: Oncology

## 2024-07-11 VITALS — BP 111/45 | HR 77 | Resp 19

## 2024-07-11 VITALS — BP 114/52 | HR 82 | Temp 98.9°F | Resp 17 | Ht 66.0 in | Wt 112.0 lb

## 2024-07-11 DIAGNOSIS — R634 Abnormal weight loss: Secondary | ICD-10-CM | POA: Diagnosis not present

## 2024-07-11 DIAGNOSIS — C9310 Chronic myelomonocytic leukemia not having achieved remission: Secondary | ICD-10-CM

## 2024-07-11 DIAGNOSIS — R110 Nausea: Secondary | ICD-10-CM

## 2024-07-11 DIAGNOSIS — T451X5A Adverse effect of antineoplastic and immunosuppressive drugs, initial encounter: Secondary | ICD-10-CM | POA: Diagnosis not present

## 2024-07-11 DIAGNOSIS — Z5111 Encounter for antineoplastic chemotherapy: Secondary | ICD-10-CM | POA: Diagnosis not present

## 2024-07-11 DIAGNOSIS — E790 Hyperuricemia without signs of inflammatory arthritis and tophaceous disease: Secondary | ICD-10-CM | POA: Diagnosis not present

## 2024-07-11 MED ORDER — SODIUM CHLORIDE 0.9 % IV SOLN
75.0000 mg/m2 | Freq: Once | INTRAVENOUS | Status: AC
  Administered 2024-07-11: 116 mg via INTRAVENOUS
  Filled 2024-07-11: qty 11.6

## 2024-07-11 MED ORDER — SODIUM CHLORIDE 0.9 % IV SOLN: INTRAVENOUS | Status: DC

## 2024-07-11 MED ORDER — ONDANSETRON HCL 4 MG/2ML IJ SOLN
8.0000 mg | Freq: Once | INTRAMUSCULAR | Status: AC
  Administered 2024-07-11: 8 mg via INTRAVENOUS
  Filled 2024-07-11: qty 4

## 2024-07-11 NOTE — Patient Instructions (Signed)
 [facility] [place] - A DEPT OF [facility]  Discharge Instructions: Thank you for choosing [facility] to provide your oncology and hematology care.  If you have a lab appointment with the Cancer Center - please note that after April 8th, 2024, all labs will be drawn in the cancer center.  You do not have to check in or register with the main entrance as you have in the past but will complete your check-in in the cancer center.  Wear comfortable clothing and clothing appropriate for easy access to any Portacath or PICC line.   We strive to give you quality time with your provider. You may need to reschedule your appointment if you arrive late (15 or more minutes).  Arriving late affects you and other patients whose appointments are after yours.  Also, if you miss three or more appointments without notifying the office, you may be dismissed from the clinic at the providers discretion.      For prescription refill requests, have your pharmacy contact our office and allow 72 hours for refills to be completed.    Today you received the following chemotherapy and/or immunotherapy agents viddaza.        To help prevent nausea and vomiting after your treatment, we encourage you to take your nausea medication as directed.  BELOW ARE SYMPTOMS THAT SHOULD BE REPORTED IMMEDIATELY: *FEVER GREATER THAN 100.4 F (38 C) OR HIGHER *CHILLS OR SWEATING *NAUSEA AND VOMITING THAT IS NOT CONTROLLED WITH YOUR NAUSEA MEDICATION *UNUSUAL SHORTNESS OF BREATH *UNUSUAL BRUISING OR BLEEDING *URINARY PROBLEMS (pain or burning when urinating, or frequent urination) *BOWEL PROBLEMS (unusual diarrhea, constipation, pain near the anus) TENDERNESS IN MOUTH AND THROAT WITH OR WITHOUT PRESENCE OF ULCERS (sore throat, sores in mouth, or a toothache) UNUSUAL RASH, SWELLING OR PAIN  UNUSUAL VAGINAL DISCHARGE OR ITCHING   Items with * indicate a potential emergency and should be followed  up as soon as possible or go to the Emergency Department if any problems should occur.  Please show the CHEMOTHERAPY ALERT CARD or IMMUNOTHERAPY ALERT CARD at check-in to the Emergency Department and triage nurse.  Should you have questions after your visit or need to cancel or reschedule your appointment, please contact [facility]CER CTR [place] - A DEPT OF [facility] [phone]  and follow the prompts.  Office hours are 8:00 a.m. to 4:30 p.m. Monday - Friday. Please note that voicemails left after 4:00 p.m. may not be returned until the following business day.  We are closed weekends and major holidays. You have access to a nurse at all times for urgent questions. Please call the main number to the clinic [phone] and follow the prompts.  For any non-urgent questions, you may also contact your provider using MyChart. We now offer e-Visits for anyone 28 and older to request care online for non-urgent symptoms. For details visit [email or web address].   Also download the MyChart app! Go to the app store, search MyChart, open the app, select [place], and log in with your MyChart username and password.

## 2024-07-11 NOTE — Progress Notes (Signed)
 Patient has been examined by Dr. Davonna. Vital signs and labs have been reviewed by MD - ANC, Creatinine, LFTs, hemoglobin, and platelets have been reviewed by M.D. - pt may proceed with treatment.  Primary RN and pharmacy notified.

## 2024-07-11 NOTE — Patient Instructions (Signed)

## 2024-07-11 NOTE — Progress Notes (Signed)
 " Patient Care Team: [name], MD as PCP - General (Internal Medicine) Jeannetta Lonni ORN, MD as Consulting Physician (Rheumatology) Robinson Mayo, OD as Referring Physician (Optometry) Elois, Devere SAUNDERS, LCSW as Social Worker (Licensed Clinical Social Worker) Darlean, Ozell NOVAK, MD as Consulting Physician (Pulmonary Disease) Davonna Siad, MD as Consulting Physician (Oncology) Perri Karie CROME, MD as Referring Physician (Internal Medicine) Magda Debby SAILOR, MD as Consulting Physician (Vascular Surgery)  Clinic Day:  07/11/2024  Referring physician: [name], MD   CHIEF COMPLAINT:  CC: CMML    ASSESSMENT & PLAN:   Assessment & Plan: Shivonne Schwartzman  is a 79 y.o. female with CMML  Assessment and Plan  Chronic myelomonocytic leukemia (CMML), active disease Active CMML requiring immediate treatment to prevent transformation into acute leukemia. CMML prognostic score of 3, high risk, associated median survival: 11 months, cumulative probability of transformation to AML in 2 years: 54% Evaluated by Dr.Powell at [facility] Started on Azacitadine 06/13/2024   - Day 2 cycle 2 of azacitidine .  Tolerating very well with no significant complaints. - Labs reviewed today: And CMP: Normal creatinine, normal LFTs, CBC: WBC: 15.2, hemoglobin: 8.6, platelets: 585 - Physical exam stable today.  Will proceed with chemotherapy today. - Will continue azacitidine  75 mg/m days 1-7 every 28-day cycle. - Continue to follow with Dr. Perri - Will consider bone marrow biopsy after completing 3-4 cycles of chemotherapy.  Return to clinic in 4 weeks for follow-up.    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  Chemotherapy-induced nausea Well-controlled with Zofran   Weight loss Patient has recent 5 pounds weight  loss Patient reports eating small meals  - Patient has an appointment with nutritionist - Encouraged high-calorie diet.  The patient understands the plans discussed today and is in agreement with them.  She knows to contact our office if she develops concerns prior to her next appointment.  22  minutes of total time was spent for this patient encounter, including preparation,review of records,  face-to-face counseling with the patient and coordination of care, physical exam, and documentation of the encounter.    LILLETTE Verneta SAUNDERS Teague,acting as a neurosurgeon for Siad Davonna, MD.,have documented all relevant documentation on the behalf of Siad Davonna, MD,as directed by  Siad Davonna, MD while in the presence of Siad Davonna, MD.  I, Siad Davonna MD, have reviewed the above documentation for accuracy and completeness, and I agree with the above.    Siad Davonna, MD  [facility] [place] - A DEPT OF [facility] [address] [place] [address] Dept: [phone] Dept Fax: [phone]   No orders of the defined types were placed in this encounter.    ONCOLOGY HISTORY:   I have reviewed her chart and materials related to her cancer extensively and collaborated history with the patient. 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 RBC: 2.67. HGB: 8.8. MCV: 102.2 -04/05/2024: Ferritin: 362. Vitamin B12: 1,742. Iron  saturation: 55% -04/11/2024: Pathologist blood smear review: Moderate leukocytosis with circulating immature monocytes and  blast suspicious for  acute leukemia.  Background macrocytic anemia and myelodysplastic granulocytes.  -04/13/2024: Peripheral blood flow cytometry: Abnormal CD34 positive blast population constituting 11% of all cells analyzed. The blasts are positive for CD5, CD33, CD38, CD117 and HLA-DR.  -04/13/2024: CBC: WBC: 27.0. RBC:  2.06. HGB: 6.8. MCV: 102.9. PLT: 419.  -04/14/2024: NGS JAK2 E12-15/CALR/MPL testing: Negative -04/14/2024: BCR ABL1 Quant: Negative -04/14/2024: MM Panel: IgM 276, M-spike not observed. KFLC: 24.1 with normal FLC ratio of 0.95. LDH-: 339 -04/27/2024 - 05/09/2024: Presented to ED and hospitalized due to fever and generalized weakness, found to have severe leukocytosis -04/27/2024: CBC diff: WBC: 60.5 with ANC of 30.3, absolute lymphocytes of 11.5, absolute monocytes of 3.0, and absolute eosinophils 1.2. RBC: 3.17. HGB: 9.9. MCV: 100.3. PLT: 829. -04/27/2024: Pathologist Blood smear review: Leukocytosis with left shift including blasts, promyelocytes, and myelocytes as well as monocytes.  -05/02/2024: CBC diff: WBC: 123.3 with ANC of 71.5, Ab Lymphocytes of 7.4, and Ab monocytes of 14.8. RBC: 2.66. HGB: 8.4. MCV: 100.8. PLT: 1,250.  -05/02/2024: LDH: 879. EPO: 24.7.  -05/03/2024: Bone Marrow Biopsy.  Pathology: Primary myeloid neoplasm with dysplasia and increased blasts (10%). There is evidence of dysplasia in the myeloid/neutrophilic lineage with hyper granularity and abnormal nuclear clumping. There is a prominent thrombocytosis. The bone marrow is hypercellular essentially only 3 to 100%. This is due to a panmyelosis with prominent left shift in both lineages as well as a prominent megakaryocytic hyperplasia with significant dysplasia including hypolobulated forms, micromegakaryocytes and pawn ball nuclei. Flow cytometry identified 10% of the cells to be CD34 positive blasts with partial CD34 and HLA-DR positivity consistent with myeloid blasts. There is dim CD5 positivity of uncertain clinical  significance but has been reported in high risk MDS cases.  - The main differential given the leukocytosis as well as dysplasia would include a chronic myelomonocytic leukemia (at least CMML to possible blast phase/conversion to acute myeloid leukemia), a de novo acute myeloid leukemia as well as other high-grade MDS type lesions.  -05/11/2024: Chromosome Analysis: Abnormal Female Karyotype with monosomy 7 -05/16/2024: Myeloid FISH Panel: Normal. FLT3 Mutation not detected. RUNX1: Not detected.  Normal myeloid FISH. - 06/13/2024-current: Azacitidine  75 mg/m days 1-7 of a 28-day cycle  Current Treatment: Azacitidine  75 mg/m days 1-7 of a 28-day cycle  INTERVAL HISTORY:   Doreene Forrey is here today for follow-up of CMML.  She is accompanied by her son today.  She is tolerating treatment well and notes nausea that is controlled with Zofran . Hula has lost 5 pounds since her last visit with me and attributes this to eating small amounts of food during meals.   She feels significantly better since she was discharged from the hospital. Her wound from her port has improved.   I have reviewed the past medical history, past surgical history, social history and family history with the patient and they are unchanged from previous note.  ALLERGIES:  has no known allergies.  MEDICATIONS:  Current Outpatient Medications  Medication Sig Dispense Refill   allopurinol  (ZYLOPRIM ) 100 MG tablet Take 1 tablet (100 mg total) by mouth daily. 30 tablet 1   apixaban  (ELIQUIS ) 5 MG TABS tablet Take 1 tablet (5 mg total) by mouth 2 (two) times daily. 60 tablet 3   aspirin  EC 81 MG tablet Take 81 mg by mouth daily. Swallow whole.     azaCITIDine  5 mg/2 mLs in lactated ringers  infusion Inject into the vein every 28 (twenty-eight) days.  famotidine  (PEPCID ) 40 MG tablet TAKE ONE TABLET BY MOUTH DAILY 90 tablet 1   folic acid  (FOLVITE ) 1 MG tablet Take 1 tablet (1 mg total) by mouth daily. 90  tablet 3   hydrOXYzine  (ATARAX ) 10 MG tablet Take 1 tablet (10 mg total) by mouth 2 (two) times daily as needed for anxiety. 30 tablet 2   lidocaine -prilocaine  (EMLA ) cream Apply to affected area once 30 g 3   methotrexate  (RHEUMATREX) 2.5 MG tablet Take 6 tablets (15 mg total) by mouth once a week. Caution:Chemotherapy. Protect from light. 72 tablet 0   ondansetron  (ZOFRAN ) 4 MG tablet Take 1 tablet (4 mg total) by mouth every 8 (eight) hours as needed for nausea or vomiting. 20 tablet 0   ondansetron  (ZOFRAN ) 8 MG tablet Take 1 tablet (8 mg total) by mouth every 8 (eight) hours as needed for nausea or vomiting. 30 tablet 1   prochlorperazine  (COMPAZINE ) 10 MG tablet Take 1 tablet (10 mg total) by mouth every 6 (six) hours as needed for nausea or vomiting. 30 tablet 1   No current facility-administered medications for this visit.   Facility-Administered Medications Ordered in Other Visits  Medication Dose Route Frequency Provider Last Rate Last Admin   0.9 %  sodium chloride  infusion   Intravenous Continuous Estellar Cadena, MD 10 mL/hr at 06/16/24 1136 New Bag at 06/16/24 1136    VITALS:  Blood pressure (!) 114/52, pulse 82, temperature 98.9 F (37.2 C), temperature source Tympanic, resp. rate 17, height 5' 6 (1.676 m), weight 112 lb (50.8 kg), SpO2 100%.  Wt Readings from Last 3 Encounters:  07/11/24 112 lb (50.8 kg)  07/10/24 112 lb 9.6 oz (51.1 kg)  06/19/24 117 lb (53.1 kg)    Body mass index is 18.08 kg/m.  Performance status (ECOG): 1 - Symptomatic but completely ambulatory  PHYSICAL EXAM:   GENERAL:alert, no distress and comfortable SKIN: Port site: Some swelling over the port, discharge from the port decreased, no signs of infection at this time. LYMPH:  no palpable lymphadenopathy in the cervical, axillary or inguinal LUNGS: clear to auscultation and percussion with normal breathing effort HEART: regular rate & rhythm and no murmurs and no lower extremity  edema ABDOMEN:abdomen soft, non-tender and normal bowel sounds Musculoskeletal:no cyanosis of digits and no clubbing  NEURO: alert & oriented x 3 with fluent speech   LABORATORY DATA:  I have reviewed the data as listed   Lab Results  Component Value Date   WBC 15.2 (H) 07/10/2024   NEUTROABS 6.1 07/10/2024   HGB 8.6 (L) 07/10/2024   HCT 28.1 (L) 07/10/2024   MCV 103.3 (H) 07/10/2024   PLT 585 (H) 07/10/2024      Chemistry      Component Value Date/Time   NA 140 07/10/2024 1219   NA 140 10/13/2023 1126   K 4.1 07/10/2024 1219   CL 107 07/10/2024 1219   CO2 26 07/10/2024 1219   BUN 8 07/10/2024 1219   BUN 9 10/13/2023 1126   CREATININE 0.91 07/10/2024 1219   CREATININE 1.15 (H) 03/27/2024 1205      Component Value Date/Time   CALCIUM 8.5 (L) 07/10/2024 1219   ALKPHOS 97 07/10/2024 1219   AST 22 07/10/2024 1219   ALT 8 07/10/2024 1219   BILITOT 0.4 07/10/2024 1219   BILITOT 0.3 01/29/2023 0855       Latest Reference Range & Units 05/19/24 09:19  Iron  28 - 170 ug/dL 858  UIBC ug/dL 898  TIBC 749 -  450 ug/dL 757 (L)  Saturation Ratios 10.4 - 31.8 % 58 (H)  Ferritin 11 - 307 ng/mL 986 (H)  Folate >5.9 ng/mL >20.0  Vitamin B12 180 - 914 pg/mL 1,957 (H)  (L): Data is abnormally low (H): Data is abnormally high   Latest Reference Range & Units 05/19/24 09:18  LDH 105 - 235 U/L 382 (H)  (H): Data is abnormally high   Latest Reference Range & Units 05/19/24 09:19  RBC. 3.87 - 5.11 MIL/uL 2.55 (L)  Retic Ct Pct 0.4 - 3.1 % 0.9  Retic Count, Absolute 19.0 - 186.0 K/uL 23.5  Reticulocyte Hemoglobin >27.9 pg 30.3  Immature Retic Fract 2.3 - 15.9 % 9.4  (L): Data is abnormally low  RADIOGRAPHIC STUDIES: I have personally reviewed the radiological images as listed and agreed with the findings in the report.   "

## 2024-07-11 NOTE — Progress Notes (Signed)

## 2024-07-12 ENCOUNTER — Inpatient Hospital Stay

## 2024-07-12 VITALS — BP 110/41 | HR 80 | Temp 98.3°F | Resp 18

## 2024-07-12 DIAGNOSIS — C9310 Chronic myelomonocytic leukemia not having achieved remission: Secondary | ICD-10-CM

## 2024-07-12 DIAGNOSIS — Z5111 Encounter for antineoplastic chemotherapy: Secondary | ICD-10-CM | POA: Diagnosis not present

## 2024-07-12 MED ORDER — ONDANSETRON HCL 4 MG/2ML IJ SOLN
8.0000 mg | Freq: Once | INTRAMUSCULAR | Status: AC
  Administered 2024-07-12: 8 mg via INTRAVENOUS
  Filled 2024-07-12: qty 4

## 2024-07-12 MED ORDER — SODIUM CHLORIDE 0.9 % IV SOLN: INTRAVENOUS | Status: DC

## 2024-07-12 MED ORDER — SODIUM CHLORIDE 0.9 % IV SOLN
75.0000 mg/m2 | Freq: Once | INTRAVENOUS | Status: AC
  Administered 2024-07-12: 116 mg via INTRAVENOUS
  Filled 2024-07-12: qty 11.6

## 2024-07-12 NOTE — Progress Notes (Signed)
 Patient presents today for day 3 chemotherapy infusion of Vidaza . Patient is in satisfactory condition with no new complaints voiced.  Vital signs are stable.  No labs required.  Patient's port red, bruised, and warm to touch. Small wound note above port-no active bleeding. Patient denies tenderness. Dr Davonna aware, assessed port and gave approval to access. We will proceed with treatment per MD orders.   Patient tolerated treatment well with no complaints voiced. Patient remains accessed for treatment tomorrow. Patient left ambulatory in stable condition.  Vital signs stable at discharge.  Follow up as scheduled.

## 2024-07-12 NOTE — Patient Instructions (Signed)
 [facility] [place] - A DEPT OF [facility]  Discharge Instructions: Thank you for choosing [facility] to provide your oncology and hematology care.  If you have a lab appointment with the Cancer Center - please note that after April 8th, 2024, all labs will be drawn in the cancer center.  You do not have to check in or register with the main entrance as you have in the past but will complete your check-in in the cancer center.  Wear comfortable clothing and clothing appropriate for easy access to any Portacath or PICC line.   We strive to give you quality time with your provider. You may need to reschedule your appointment if you arrive late (15 or more minutes).  Arriving late affects you and other patients whose appointments are after yours.  Also, if you miss three or more appointments without notifying the office, you may be dismissed from the clinic at the provider's discretion.      For prescription refill requests, have your pharmacy contact our office and allow 72 hours for refills to be completed.    Today you received the following chemotherapy and/or immunotherapy agents Vidaza.  Azacitidine Injection What is this medication? AZACITIDINE (ay za SITE i deen) treats blood and bone marrow cancers. It works by slowing down the growth of cancer cells. This medicine may be used for other purposes; ask your health care provider or pharmacist if you have questions. COMMON BRAND NAME(S): Vidaza What should I tell my care team before I take this medication? They need to know if you have any of these conditions: Kidney disease Liver disease Low blood cell levels, such as low white cells, platelets, or red blood cells Low levels of albumin in the blood Low levels of bicarbonate in the blood An unusual or allergic reaction to azacitidine, mannitol, other medications, foods, dyes, or preservatives If you or your partner are pregnant or trying to get  pregnant Breast-feeding How should I use this medication? This medication is injected into a vein or under the skin. It is given by your care team in a hospital or clinic setting. Talk to your care team about the use of this medication in children. While it may be prescribed for children as young as 1 month for selected conditions, precautions do apply. Overdosage: If you think you have taken too much of this medicine contact a poison control center or emergency room at once. NOTE: This medicine is only for you. Do not share this medicine with others. What if I miss a dose? Keep appointments for follow-up doses. It is important not to miss your dose. Call your care team if you are unable to keep an appointment. What may interact with this medication? Interactions are not expected. This list may not describe all possible interactions. Give your health care provider a list of all the medicines, herbs, non-prescription drugs, or dietary supplements you use. Also tell them if you smoke, drink alcohol, or use illegal drugs. Some items may interact with your medicine. What should I watch for while using this medication? Your condition will be monitored carefully while you are receiving this medication. This medication may make you feel generally unwell. This is not uncommon as chemotherapy can affect healthy cells as well as cancer cells. Report any side effects. Continue your course of treatment even though you feel ill unless your care team tells you to stop. You may need blood work done while you are  taking this medication. Other product types may be available that contain the medication azacitidine. The injection and oral products should not be used in place of one another. Talk to your care team if you have questions. This medication can cause serious side effects. To reduce the risk, your care team may give you other medications to take before receiving this one. Be sure to follow the directions from  your care team. This medication may increase your risk of getting an infection. Call your care team for advice if you get a fever, chills, sore throat, or other symptoms of a cold or flu. Do not treat yourself. Try to avoid being around people who are sick. Avoid taking medications that contain aspirin, acetaminophen, ibuprofen, naproxen, or ketoprofen unless instructed by your care team. These medications may hide a fever. Be careful brushing or flossing your teeth or using a toothpick because you may get an infection or bleed more easily. If you have any dental work done, tell your dentist you are receiving this medication. Talk to your care team if you or your partner may be pregnant. Serious birth defects can occur if you take this medication during pregnancy and for 6 months after the last dose. You will need a negative pregnancy test before starting this medication. Contraception is recommended while taking this medication and for 6 months after the last dose. Your care team can help you find the option that works for you. If your partner can get pregnant, use a condom during sex while taking this medication and for 3 months after the last dose. Do not breastfeed while taking this medication and for 1 week after the last dose. This medication may cause infertility. Talk to your care team if you are concerned about your fertility. What side effects may I notice from receiving this medication? Side effects that you should report to your care team as soon as possible: Allergic reactions--skin rash, itching, hives, swelling of the face, lips, tongue, or throat Infection--fever, chills, cough, sore throat, wounds that don't heal, pain or trouble when passing urine, general feeling of discomfort or being unwell Kidney injury--decrease in the amount of urine, swelling of the ankles, hands, or feet Liver injury--right upper belly pain, loss of appetite, nausea, light-colored stool, dark yellow or Laurie Wallace  urine, yellowing skin or eyes, unusual weakness or fatigue Low red blood cell level--unusual weakness or fatigue, dizziness, headache, trouble breathing Tumor lysis syndrome (TLS)--nausea, vomiting, diarrhea, decrease in the amount of urine, dark urine, unusual weakness or fatigue, confusion, muscle pain or cramps, fast or irregular heartbeat, joint pain Unusual bruising or bleeding Side effects that usually do not require medical attention (report to your care team if they continue or are bothersome): Constipation Diarrhea Nausea Pain, redness, or irritation at injection site Vomiting This list may not describe all possible side effects. Call your doctor for medical advice about side effects. You may report side effects to FDA at 1-800-FDA-1088. Where should I keep my medication? This medication is given in a hospital or clinic. It will not be stored at home. NOTE: This sheet is a summary. It may not cover all possible information. If you have questions about this medicine, talk to your doctor, pharmacist, or health care provider.  2024 Elsevier/Gold Standard (2023-02-22 00:00:00)       To help prevent nausea and vomiting after your treatment, we encourage you to take your nausea medication as directed.  BELOW ARE SYMPTOMS THAT SHOULD BE REPORTED IMMEDIATELY: *FEVER GREATER THAN 100.4  F (38 C) OR HIGHER *CHILLS OR SWEATING *NAUSEA AND VOMITING THAT IS NOT CONTROLLED WITH YOUR NAUSEA MEDICATION *UNUSUAL SHORTNESS OF BREATH *UNUSUAL BRUISING OR BLEEDING *URINARY PROBLEMS (pain or burning when urinating, or frequent urination) *BOWEL PROBLEMS (unusual diarrhea, constipation, pain near the anus) TENDERNESS IN MOUTH AND THROAT WITH OR WITHOUT PRESENCE OF ULCERS (sore throat, sores in mouth, or a toothache) UNUSUAL RASH, SWELLING OR PAIN  UNUSUAL VAGINAL DISCHARGE OR ITCHING   Items with * indicate a potential emergency and should be followed up as soon as possible or go to the Emergency  Department if any problems should occur.  Please show the CHEMOTHERAPY ALERT CARD or IMMUNOTHERAPY ALERT CARD at check-in to the Emergency Department and triage nurse.  Should you have questions after your visit or need to cancel or reschedule your appointment, please contact [facility] [place] - A DEPT OF [facility] [phone]  and follow the prompts.  Office hours are 8:00 a.m. to 4:30 p.m. Monday - Friday. Please note that voicemails left after 4:00 p.m. may not be returned until the following business day.  We are closed weekends and major holidays. You have access to a nurse at all times for urgent questions. Please call the main number to the clinic [phone] and follow the prompts.  For any non-urgent questions, you may also contact your provider using MyChart. We now offer e-Visits for anyone 88 and older to request care online for non-urgent symptoms. For details visit [email or web address].   Also download the MyChart app! Go to the app store, search "MyChart", open the app, select [place], and log in with your MyChart username and password.

## 2024-07-13 ENCOUNTER — Inpatient Hospital Stay

## 2024-07-13 VITALS — BP 108/40 | HR 81 | Temp 97.7°F | Resp 18

## 2024-07-13 DIAGNOSIS — Z5111 Encounter for antineoplastic chemotherapy: Secondary | ICD-10-CM | POA: Diagnosis not present

## 2024-07-13 DIAGNOSIS — C9310 Chronic myelomonocytic leukemia not having achieved remission: Secondary | ICD-10-CM

## 2024-07-13 MED ORDER — ONDANSETRON HCL 4 MG/2ML IJ SOLN
8.0000 mg | Freq: Once | INTRAMUSCULAR | Status: AC
  Administered 2024-07-13: 8 mg via INTRAVENOUS
  Filled 2024-07-13: qty 4

## 2024-07-13 MED ORDER — SODIUM CHLORIDE 0.9 % IV SOLN
75.0000 mg/m2 | Freq: Once | INTRAVENOUS | Status: AC
  Administered 2024-07-13: 116 mg via INTRAVENOUS
  Filled 2024-07-13: qty 11.6

## 2024-07-13 MED ORDER — SODIUM CHLORIDE 0.9 % IV SOLN: INTRAVENOUS | Status: DC

## 2024-07-13 NOTE — Progress Notes (Signed)
 Patient presents today for D4 Vidaza  chemotherapy infusion.  Patient is in satisfactory condition with no new complaints voiced.  Vital signs are stable.  Labs reviewed and all labs are within treatment parameters.  We will proceed with treatment per MD orders.    Treatment given today per MD orders. Tolerated infusion without adverse affects. Vital signs stable. No complaints at this time. Discharged from clinic ambulatory in stable condition. Alert and oriented x 3. F/U with [facility] as scheduled.

## 2024-07-13 NOTE — Patient Instructions (Signed)
 [facility] [place] - A DEPT OF [facility]  Discharge Instructions: Thank you for choosing [facility] to provide your oncology and hematology care.  If you have a lab appointment with the Cancer Center - please note that after April 8th, 2024, all labs will be drawn in the cancer center.  You do not have to check in or register with the main entrance as you have in the past but will complete your check-in in the cancer center.  Wear comfortable clothing and clothing appropriate for easy access to any Portacath or PICC line.   We strive to give you quality time with your provider. You may need to reschedule your appointment if you arrive late (15 or more minutes).  Arriving late affects you and other patients whose appointments are after yours.  Also, if you miss three or more appointments without notifying the office, you may be dismissed from the clinic at the provider's discretion.      For prescription refill requests, have your pharmacy contact our office and allow 72 hours for refills to be completed.    Today you received the following chemotherapy and/or immunotherapy agents D4 Vidaza   To help prevent nausea and vomiting after your treatment, we encourage you to take your nausea medication as directed.  Azacitidine Injection What is this medication? AZACITIDINE (ay za SITE i deen) treats blood and bone marrow cancers. It works by slowing down the growth of cancer cells. This medicine may be used for other purposes; ask your health care provider or pharmacist if you have questions. COMMON BRAND NAME(S): Vidaza What should I tell my care team before I take this medication? They need to know if you have any of these conditions: Kidney disease Liver disease Low blood cell levels, such as low white cells, platelets, or red blood cells Low levels of albumin in the blood Low levels of bicarbonate in the blood An unusual or allergic reaction to  azacitidine, mannitol, other medications, foods, dyes, or preservatives If you or your partner are pregnant or trying to get pregnant Breast-feeding How should I use this medication? This medication is injected into a vein or under the skin. It is given by your care team in a hospital or clinic setting. Talk to your care team about the use of this medication in children. While it may be prescribed for children as young as 1 month for selected conditions, precautions do apply. Overdosage: If you think you have taken too much of this medicine contact a poison control center or emergency room at once. NOTE: This medicine is only for you. Do not share this medicine with others. What if I miss a dose? Keep appointments for follow-up doses. It is important not to miss your dose. Call your care team if you are unable to keep an appointment. What may interact with this medication? Interactions are not expected. This list may not describe all possible interactions. Give your health care provider a list of all the medicines, herbs, non-prescription drugs, or dietary supplements you use. Also tell them if you smoke, drink alcohol, or use illegal drugs. Some items may interact with your medicine. What should I watch for while using this medication? Your condition will be monitored carefully while you are receiving this medication. This medication may make you feel generally unwell. This is not uncommon as chemotherapy can affect healthy cells as well as cancer cells. Report any side effects. Continue your course of treatment  even though you feel ill unless your care team tells you to stop. You may need blood work done while you are taking this medication. Other product types may be available that contain the medication azacitidine. The injection and oral products should not be used in place of one another. Talk to your care team if you have questions. This medication can cause serious side effects. To reduce  the risk, your care team may give you other medications to take before receiving this one. Be sure to follow the directions from your care team. This medication may increase your risk of getting an infection. Call your care team for advice if you get a fever, chills, sore throat, or other symptoms of a cold or flu. Do not treat yourself. Try to avoid being around people who are sick. Avoid taking medications that contain aspirin, acetaminophen, ibuprofen, naproxen, or ketoprofen unless instructed by your care team. These medications may hide a fever. Be careful brushing or flossing your teeth or using a toothpick because you may get an infection or bleed more easily. If you have any dental work done, tell your dentist you are receiving this medication. Talk to your care team if you or your partner may be pregnant. Serious birth defects can occur if you take this medication during pregnancy and for 6 months after the last dose. You will need a negative pregnancy test before starting this medication. Contraception is recommended while taking this medication and for 6 months after the last dose. Your care team can help you find the option that works for you. If your partner can get pregnant, use a condom during sex while taking this medication and for 3 months after the last dose. Do not breastfeed while taking this medication and for 1 week after the last dose. This medication may cause infertility. Talk to your care team if you are concerned about your fertility. What side effects may I notice from receiving this medication? Side effects that you should report to your care team as soon as possible: Allergic reactions--skin rash, itching, hives, swelling of the face, lips, tongue, or throat Infection--fever, chills, cough, sore throat, wounds that don't heal, pain or trouble when passing urine, general feeling of discomfort or being unwell Kidney injury--decrease in the amount of urine, swelling of the  ankles, hands, or feet Liver injury--right upper belly pain, loss of appetite, nausea, light-colored stool, dark yellow or brown urine, yellowing skin or eyes, unusual weakness or fatigue Low red blood cell level--unusual weakness or fatigue, dizziness, headache, trouble breathing Tumor lysis syndrome (TLS)--nausea, vomiting, diarrhea, decrease in the amount of urine, dark urine, unusual weakness or fatigue, confusion, muscle pain or cramps, fast or irregular heartbeat, joint pain Unusual bruising or bleeding Side effects that usually do not require medical attention (report to your care team if they continue or are bothersome): Constipation Diarrhea Nausea Pain, redness, or irritation at injection site Vomiting This list may not describe all possible side effects. Call your doctor for medical advice about side effects. You may report side effects to FDA at 1-800-FDA-1088. Where should I keep my medication? This medication is given in a hospital or clinic. It will not be stored at home. NOTE: This sheet is a summary. It may not cover all possible information. If you have questions about this medicine, talk to your doctor, pharmacist, or health care provider.  2024 Elsevier/Gold Standard (2023-02-22 00:00:00)  BELOW ARE SYMPTOMS THAT SHOULD BE REPORTED IMMEDIATELY: *FEVER GREATER THAN 100.4 F (38 C)  OR HIGHER *CHILLS OR SWEATING *NAUSEA AND VOMITING THAT IS NOT CONTROLLED WITH YOUR NAUSEA MEDICATION *UNUSUAL SHORTNESS OF BREATH *UNUSUAL BRUISING OR BLEEDING *URINARY PROBLEMS (pain or burning when urinating, or frequent urination) *BOWEL PROBLEMS (unusual diarrhea, constipation, pain near the anus) TENDERNESS IN MOUTH AND THROAT WITH OR WITHOUT PRESENCE OF ULCERS (sore throat, sores in mouth, or a toothache) UNUSUAL RASH, SWELLING OR PAIN  UNUSUAL VAGINAL DISCHARGE OR ITCHING   Items with * indicate a potential emergency and should be followed up as soon as possible or go to the  Emergency Department if any problems should occur.  Please show the CHEMOTHERAPY ALERT CARD or IMMUNOTHERAPY ALERT CARD at check-in to the Emergency Department and triage nurse.  Should you have questions after your visit or need to cancel or reschedule your appointment, please contact [facility] [place] - A DEPT OF Eligha Bridegroom [facility] [phone]  and follow the prompts.  Office hours are 8:00 a.m. to 4:30 p.m. Monday - Friday. Please note that voicemails left after 4:00 p.m. may not be returned until the following business day.  We are closed weekends and major holidays. You have access to a nurse at all times for urgent questions. Please call the main number to the clinic [phone] and follow the prompts.  For any non-urgent questions, you may also contact your provider using MyChart. We now offer e-Visits for anyone 50 and older to request care online for non-urgent symptoms. For details visit [email or web address].   Also download the MyChart app! Go to the app store, search "MyChart", open the app, select [facility], and log in with your MyChart username and password.

## 2024-07-14 ENCOUNTER — Other Ambulatory Visit: Payer: Self-pay | Admitting: Internal Medicine

## 2024-07-14 ENCOUNTER — Inpatient Hospital Stay

## 2024-07-14 VITALS — BP 108/61 | HR 82 | Temp 98.3°F | Resp 18

## 2024-07-14 DIAGNOSIS — M059 Rheumatoid arthritis with rheumatoid factor, unspecified: Secondary | ICD-10-CM

## 2024-07-14 DIAGNOSIS — C9310 Chronic myelomonocytic leukemia not having achieved remission: Secondary | ICD-10-CM

## 2024-07-14 DIAGNOSIS — Z5111 Encounter for antineoplastic chemotherapy: Secondary | ICD-10-CM | POA: Diagnosis not present

## 2024-07-14 MED ORDER — SODIUM CHLORIDE 0.9 % IV SOLN
75.0000 mg/m2 | Freq: Once | INTRAVENOUS | Status: AC
  Administered 2024-07-14: 116 mg via INTRAVENOUS
  Filled 2024-07-14: qty 11.6

## 2024-07-14 MED ORDER — SODIUM CHLORIDE 0.9 % IV SOLN: INTRAVENOUS | Status: DC

## 2024-07-14 MED ORDER — ONDANSETRON HCL 4 MG/2ML IJ SOLN
8.0000 mg | Freq: Once | INTRAMUSCULAR | Status: AC
  Administered 2024-07-14: 8 mg via INTRAVENOUS
  Filled 2024-07-14: qty 4

## 2024-07-14 NOTE — Telephone Encounter (Signed)
 Last Fill: 03/27/2024  Labs: 07/10/2024 CMP- Calcium:8.5, Total Protein:6.2  CBC- WBC:15.2, RBC:2.72, Hemoglobin:8.6, HCT:28.1, MCV:103.3, RDW:21.5, Platelets:585, nRBC:0.5, Lymphs Abs:5.6, Abs Immature Granulocytes:1.20  Next Visit: 07/20/2024  Last Visit: 03/27/2024  DX: Seropositive rheumatoid arthritis   Current Dose per office note 03/27/2024: methotrexate  15 mg PO weekly   Okay to refill Methotrexate ?

## 2024-07-14 NOTE — Progress Notes (Signed)
 Patient presents today for chemotherapy D5 Vidaza  infusion.  Patient is in satisfactory condition with no new complaints voiced.  Vital signs are stable.  Labs reviewed and all labs are within treatment parameters.  We will proceed with treatment per MD orders.    Treatment given today per MD orders. Tolerated infusion without adverse affects. Vital signs stable. No complaints at this time. Discharged from clinic ambulatory in stable condition. Alert and oriented x 3. F/U with [facility] as scheduled.

## 2024-07-14 NOTE — Patient Instructions (Signed)
 [facility] [place] - A DEPT OF [place]  Discharge Instructions: Thank you for choosing [facility] to provide your oncology and hematology care.  If you have a lab appointment with the Cancer Center - please note that after April 8th, 2024, all labs will be drawn in the cancer center.  You do not have to check in or register with the main entrance as you have in the past but will complete your check-in in the cancer center.  Wear comfortable clothing and clothing appropriate for easy access to any Portacath or PICC line.   We strive to give you quality time with your provider. You may need to reschedule your appointment if you arrive late (15 or more minutes).  Arriving late affects you and other patients whose appointments are after yours.  Also, if you miss three or more appointments without notifying the office, you may be dismissed from the clinic at the providers discretion.      For prescription refill requests, have your pharmacy contact our office and allow 72 hours for refills to be completed.    Today you received the following chemotherapy and/or immunotherapy agents D5 Vidaza       To help prevent nausea and vomiting after your treatment, we encourage you to take your nausea medication as directed.  BELOW ARE SYMPTOMS THAT SHOULD BE REPORTED IMMEDIATELY: *FEVER GREATER THAN 100.4 F (38 C) OR HIGHER *CHILLS OR SWEATING *NAUSEA AND VOMITING THAT IS NOT CONTROLLED WITH YOUR NAUSEA MEDICATION *UNUSUAL SHORTNESS OF BREATH *UNUSUAL BRUISING OR BLEEDING *URINARY PROBLEMS (pain or burning when urinating, or frequent urination) *BOWEL PROBLEMS (unusual diarrhea, constipation, pain near the anus) TENDERNESS IN MOUTH AND THROAT WITH OR WITHOUT PRESENCE OF ULCERS (sore throat, sores in mouth, or a toothache) UNUSUAL RASH, SWELLING OR PAIN  UNUSUAL VAGINAL DISCHARGE OR ITCHING   Azacitidine  Injection What is this medication? AZACITIDINE  (ay za  SITE i deen) treats blood and bone marrow cancers. It works by slowing down the growth of cancer cells. This medicine may be used for other purposes; ask your health care provider or pharmacist if you have questions. COMMON BRAND NAME(S): Vidaza  What should I tell my care team before I take this medication? They need to know if you have any of these conditions: Kidney disease Liver disease Low blood cell levels, such as low white cells, platelets, or red blood cells Low levels of albumin  in the blood Low levels of bicarbonate in the blood An unusual or allergic reaction to azacitidine , mannitol, other medications, foods, dyes, or preservatives If you or your partner are pregnant or trying to get pregnant Breast-feeding How should I use this medication? This medication is injected into a vein or under the skin. It is given by your care team in a hospital or clinic setting. Talk to your care team about the use of this medication in children. While it may be prescribed for children as young as 1 month for selected conditions, precautions do apply. Overdosage: If you think you have taken too much of this medicine contact a poison control center or emergency room at once. NOTE: This medicine is only for you. Do not share this medicine with others. What if I miss a dose? Keep appointments for follow-up doses. It is important not to miss your dose. Call your care team if you are unable to keep an appointment. What may interact with this medication? Interactions are not expected. This list may not describe all  possible interactions. Give your health care provider a list of all the medicines, herbs, non-prescription drugs, or dietary supplements you use. Also tell them if you smoke, drink alcohol, or use illegal drugs. Some items may interact with your medicine. What should I watch for while using this medication? Your condition will be monitored carefully while you are receiving this medication. This  medication may make you feel generally unwell. This is not uncommon as chemotherapy can affect healthy cells as well as cancer cells. Report any side effects. Continue your course of treatment even though you feel ill unless your care team tells you to stop. You may need blood work done while you are taking this medication. Other product types may be available that contain the medication azacitidine . The injection and oral products should not be used in place of one another. Talk to your care team if you have questions. This medication can cause serious side effects. To reduce the risk, your care team may give you other medications to take before receiving this one. Be sure to follow the directions from your care team. This medication may increase your risk of getting an infection. Call your care team for advice if you get a fever, chills, sore throat, or other symptoms of a cold or flu. Do not treat yourself. Try to avoid being around people who are sick. Avoid taking medications that contain aspirin , acetaminophen , ibuprofen , naproxen , or ketoprofen unless instructed by your care team. These medications may hide a fever. Be careful brushing or flossing your teeth or using a toothpick because you may get an infection or bleed more easily. If you have any dental work done, tell your dentist you are receiving this medication. Talk to your care team if you or your partner may be pregnant. Serious birth defects can occur if you take this medication during pregnancy and for 6 months after the last dose. You will need a negative pregnancy test before starting this medication. Contraception is recommended while taking this medication and for 6 months after the last dose. Your care team can help you find the option that works for you. If your partner can get pregnant, use a condom during sex while taking this medication and for 3 months after the last dose. Do not breastfeed while taking this medication and for 1  week after the last dose. This medication may cause infertility. Talk to your care team if you are concerned about your fertility. What side effects may I notice from receiving this medication? Side effects that you should report to your care team as soon as possible: Allergic reactions--skin rash, itching, hives, swelling of the face, lips, tongue, or throat Infection--fever, chills, cough, sore throat, wounds that don't heal, pain or trouble when passing urine, general feeling of discomfort or being unwell Kidney injury--decrease in the amount of urine, swelling of the ankles, hands, or feet Liver injury--right upper belly pain, loss of appetite, nausea, light-colored stool, dark yellow or brown urine, yellowing skin or eyes, unusual weakness or fatigue Low red blood cell level--unusual weakness or fatigue, dizziness, headache, trouble breathing Tumor lysis syndrome (TLS)--nausea, vomiting, diarrhea, decrease in the amount of urine, dark urine, unusual weakness or fatigue, confusion, muscle pain or cramps, fast or irregular heartbeat, joint pain Unusual bruising or bleeding Side effects that usually do not require medical attention (report to your care team if they continue or are bothersome): Constipation Diarrhea Nausea Pain, redness, or irritation at injection site Vomiting This list may not describe all possible  side effects. Call your doctor for medical advice about side effects. You may report side effects to FDA at 1-800-FDA-1088. Where should I keep my medication? This medication is given in a hospital or clinic. It will not be stored at home. NOTE: This sheet is a summary. It may not cover all possible information. If you have questions about this medicine, talk to your doctor, pharmacist, or health care provider.  2024 Elsevier/Gold Standard (2023-02-22 00:00:00)  Items with * indicate a potential emergency and should be followed up as soon as possible or go to the Emergency  Department if any problems should occur.  Please show the CHEMOTHERAPY ALERT CARD or IMMUNOTHERAPY ALERT CARD at check-in to the Emergency Department and triage nurse.  Should you have questions after your visit or need to cancel or reschedule your appointment, please contact [facility] [place] - A DEPT OF [facility] [phone]  and follow the prompts.  Office hours are 8:00 a.m. to 4:30 p.m. Monday - Friday. Please note that voicemails left after 4:00 p.m. may not be returned until the following business day.  We are closed weekends and major holidays. You have access to a nurse at all times for urgent questions. Please call the main number to the clinic [phone] and follow the prompts.  For any non-urgent questions, you may also contact your provider using MyChart. We now offer e-Visits for anyone 80 and older to request care online for non-urgent symptoms. For details visit [email or web address].   Also download the MyChart app! Go to the app store, search MyChart, open the app, select , and log in with your MyChart username and password.

## 2024-07-17 ENCOUNTER — Inpatient Hospital Stay

## 2024-07-17 ENCOUNTER — Encounter: Payer: Self-pay | Admitting: *Deleted

## 2024-07-17 VITALS — BP 107/60 | HR 88 | Temp 97.4°F | Resp 18

## 2024-07-17 DIAGNOSIS — Z5111 Encounter for antineoplastic chemotherapy: Secondary | ICD-10-CM | POA: Diagnosis not present

## 2024-07-17 DIAGNOSIS — C9310 Chronic myelomonocytic leukemia not having achieved remission: Secondary | ICD-10-CM

## 2024-07-17 LAB — CBC WITH DIFFERENTIAL/PLATELET
Abs Immature Granulocytes: 1.7 K/uL — ABNORMAL HIGH (ref 0.00–0.07)
Basophils Absolute: 0 K/uL (ref 0.0–0.1)
Basophils Relative: 0 %
Blasts: 1 %
Eosinophils Absolute: 0.6 K/uL — ABNORMAL HIGH (ref 0.0–0.5)
Eosinophils Relative: 4 %
HCT: 27.4 % — ABNORMAL LOW (ref 36.0–46.0)
Hemoglobin: 8.6 g/dL — ABNORMAL LOW (ref 12.0–15.0)
Lymphocytes Relative: 28 %
Lymphs Abs: 4.3 K/uL — ABNORMAL HIGH (ref 0.7–4.0)
MCH: 32.2 pg (ref 26.0–34.0)
MCHC: 31.4 g/dL (ref 30.0–36.0)
MCV: 102.6 fL — ABNORMAL HIGH (ref 80.0–100.0)
Metamyelocytes Relative: 4 %
Monocytes Absolute: 0.2 K/uL (ref 0.1–1.0)
Monocytes Relative: 1 %
Myelocytes: 4 %
Neutro Abs: 8.4 K/uL — ABNORMAL HIGH (ref 1.7–7.7)
Neutrophils Relative %: 55 %
Platelets: 965 K/uL (ref 150–400)
Promyelocytes Relative: 3 %
RBC: 2.67 MIL/uL — ABNORMAL LOW (ref 3.87–5.11)
RDW: 19.8 % — ABNORMAL HIGH (ref 11.5–15.5)
WBC: 15.2 K/uL — ABNORMAL HIGH (ref 4.0–10.5)
nRBC: 0.5 % — ABNORMAL HIGH (ref 0.0–0.2)

## 2024-07-17 LAB — MAGNESIUM: Magnesium: 2.1 mg/dL (ref 1.7–2.4)

## 2024-07-17 LAB — COMPREHENSIVE METABOLIC PANEL WITH GFR
ALT: 9 U/L (ref 0–44)
AST: 22 U/L (ref 15–41)
Albumin: 3.9 g/dL (ref 3.5–5.0)
Alkaline Phosphatase: 91 U/L (ref 38–126)
Anion gap: 12 (ref 5–15)
BUN: 10 mg/dL (ref 8–23)
CO2: 21 mmol/L — ABNORMAL LOW (ref 22–32)
Calcium: 8.7 mg/dL — ABNORMAL LOW (ref 8.9–10.3)
Chloride: 106 mmol/L (ref 98–111)
Creatinine, Ser: 0.97 mg/dL (ref 0.44–1.00)
GFR, Estimated: 60 mL/min — ABNORMAL LOW
Glucose, Bld: 81 mg/dL (ref 70–99)
Potassium: 4.3 mmol/L (ref 3.5–5.1)
Sodium: 139 mmol/L (ref 135–145)
Total Bilirubin: 0.3 mg/dL (ref 0.0–1.2)
Total Protein: 6.2 g/dL — ABNORMAL LOW (ref 6.5–8.1)

## 2024-07-17 LAB — SAMPLE TO BLOOD BANK

## 2024-07-17 MED ORDER — SODIUM CHLORIDE 0.9 % IV SOLN
75.0000 mg/m2 | Freq: Once | INTRAVENOUS | Status: AC
  Administered 2024-07-17: 116 mg via INTRAVENOUS
  Filled 2024-07-17: qty 11.6

## 2024-07-17 MED ORDER — SODIUM CHLORIDE 0.9 % IV SOLN: INTRAVENOUS | Status: DC

## 2024-07-17 MED ORDER — ONDANSETRON HCL 4 MG/2ML IJ SOLN
8.0000 mg | Freq: Once | INTRAMUSCULAR | Status: AC
  Administered 2024-07-17: 8 mg via INTRAVENOUS
  Filled 2024-07-17: qty 4

## 2024-07-17 NOTE — Progress Notes (Signed)
" °   07/17/24 1400  Spiritual Encounters  Type of Visit Initial  Care provided to: Patient  Referral source Chaplain assessment  Reason for visit  (Intorduction to Spiritual Care)  OnCall Visit No   Reason for Visit: Chaplain identified Pt on the schedule as a Pt I had not connected with yet and visited to deliver introduction to Spiritual Care  Description of Visit: Upon arrival I found Laurie Wallace seated in the recliner receiving treatment, with no support person present.  I introduced myself as the chaplain for the cancer center and offered a brief education on the role of a chaplain and the support we can offer to our patients, caregivers, and staff.   Laurie Wallace was warm and friendly.  She welcomed my presence and was open to the support I might provide.  There was another Pt in the room and so we did not got too deep of personal in discussion.  I will return on a future visit and gather more informaion for an appropriate spiritual assessment.   Plan of Care: I will continue to follow up with Laurie Wallace on a monthly basis.   Laurie Wallace, MDiv Chaplain, [facility] [email or web address] [phone] 07/17/2024 2:26 PM  "

## 2024-07-17 NOTE — Progress Notes (Signed)
 CRITICAL VALUE STICKER  CRITICAL VALUE:  PLT [id number]  RECEIVER (on-site recipient of call):  Wheeler Senters, RN  DATE & TIME NOTIFIED: 12:25 PM  MESSENGER (representative from lab): Rolin  MD NOTIFIED: Dr. Mickiel Dry  TIME OF NOTIFICATION: 12:25 PM  RESPONSE:  No new orders at this time.

## 2024-07-17 NOTE — Progress Notes (Signed)
Treatment given per orders. Patient tolerated it well without problems. Vitals stable and discharged home from clinic ambulatory. Follow up as scheduled.  

## 2024-07-17 NOTE — Progress Notes (Signed)
 Patient presents today for Vidiza C2 D8. Vital signs and lab work within parameters for treatment. Patient denies any side effects related to her last treatment. No questions or concerns noted. Patient has a slight cough that has not increased in severity. On going per patient's words.

## 2024-07-17 NOTE — Patient Instructions (Signed)
 [facility] [place] - A DEPT OF [facility]  Discharge Instructions: Thank you for choosing [facility] to provide your oncology and hematology care.  If you have a lab appointment with the Cancer Center - please note that after April 8th, 2024, all labs will be drawn in the cancer center.  You do not have to check in or register with the main entrance as you have in the past but will complete your check-in in the cancer center.  Wear comfortable clothing and clothing appropriate for easy access to any Portacath or PICC line.   We strive to give you quality time with your provider. You may need to reschedule your appointment if you arrive late (15 or more minutes).  Arriving late affects you and other patients whose appointments are after yours.  Also, if you miss three or more appointments without notifying the office, you may be dismissed from the clinic at the provider's discretion.      For prescription refill requests, have your pharmacy contact our office and allow 72 hours for refills to be completed.    Today you received the following chemotherapy and/or immunotherapy agents Vidaza.  Azacitidine Injection What is this medication? AZACITIDINE (ay za SITE i deen) treats blood and bone marrow cancers. It works by slowing down the growth of cancer cells. This medicine may be used for other purposes; ask your health care provider or pharmacist if you have questions. COMMON BRAND NAME(S): Vidaza What should I tell my care team before I take this medication? They need to know if you have any of these conditions: Kidney disease Liver disease Low blood cell levels, such as low white cells, platelets, or red blood cells Low levels of albumin in the blood Low levels of bicarbonate in the blood An unusual or allergic reaction to azacitidine, mannitol, other medications, foods, dyes, or preservatives If you or your partner are pregnant or trying to get  pregnant Breast-feeding How should I use this medication? This medication is injected into a vein or under the skin. It is given by your care team in a hospital or clinic setting. Talk to your care team about the use of this medication in children. While it may be prescribed for children as young as 1 month for selected conditions, precautions do apply. Overdosage: If you think you have taken too much of this medicine contact a poison control center or emergency room at once. NOTE: This medicine is only for you. Do not share this medicine with others. What if I miss a dose? Keep appointments for follow-up doses. It is important not to miss your dose. Call your care team if you are unable to keep an appointment. What may interact with this medication? Interactions are not expected. This list may not describe all possible interactions. Give your health care provider a list of all the medicines, herbs, non-prescription drugs, or dietary supplements you use. Also tell them if you smoke, drink alcohol, or use illegal drugs. Some items may interact with your medicine. What should I watch for while using this medication? Your condition will be monitored carefully while you are receiving this medication. This medication may make you feel generally unwell. This is not uncommon as chemotherapy can affect healthy cells as well as cancer cells. Report any side effects. Continue your course of treatment even though you feel ill unless your care team tells you to stop. You may need blood work done while you are  taking this medication. Other product types may be available that contain the medication azacitidine. The injection and oral products should not be used in place of one another. Talk to your care team if you have questions. This medication can cause serious side effects. To reduce the risk, your care team may give you other medications to take before receiving this one. Be sure to follow the directions from  your care team. This medication may increase your risk of getting an infection. Call your care team for advice if you get a fever, chills, sore throat, or other symptoms of a cold or flu. Do not treat yourself. Try to avoid being around people who are sick. Avoid taking medications that contain aspirin, acetaminophen, ibuprofen, naproxen, or ketoprofen unless instructed by your care team. These medications may hide a fever. Be careful brushing or flossing your teeth or using a toothpick because you may get an infection or bleed more easily. If you have any dental work done, tell your dentist you are receiving this medication. Talk to your care team if you or your partner may be pregnant. Serious birth defects can occur if you take this medication during pregnancy and for 6 months after the last dose. You will need a negative pregnancy test before starting this medication. Contraception is recommended while taking this medication and for 6 months after the last dose. Your care team can help you find the option that works for you. If your partner can get pregnant, use a condom during sex while taking this medication and for 3 months after the last dose. Do not breastfeed while taking this medication and for 1 week after the last dose. This medication may cause infertility. Talk to your care team if you are concerned about your fertility. What side effects may I notice from receiving this medication? Side effects that you should report to your care team as soon as possible: Allergic reactions--skin rash, itching, hives, swelling of the face, lips, tongue, or throat Infection--fever, chills, cough, sore throat, wounds that don't heal, pain or trouble when passing urine, general feeling of discomfort or being unwell Kidney injury--decrease in the amount of urine, swelling of the ankles, hands, or feet Liver injury--right upper belly pain, loss of appetite, nausea, light-colored stool, dark yellow or Laurie Wallace  urine, yellowing skin or eyes, unusual weakness or fatigue Low red blood cell level--unusual weakness or fatigue, dizziness, headache, trouble breathing Tumor lysis syndrome (TLS)--nausea, vomiting, diarrhea, decrease in the amount of urine, dark urine, unusual weakness or fatigue, confusion, muscle pain or cramps, fast or irregular heartbeat, joint pain Unusual bruising or bleeding Side effects that usually do not require medical attention (report to your care team if they continue or are bothersome): Constipation Diarrhea Nausea Pain, redness, or irritation at injection site Vomiting This list may not describe all possible side effects. Call your doctor for medical advice about side effects. You may report side effects to FDA at 1-800-FDA-1088. Where should I keep my medication? This medication is given in a hospital or clinic. It will not be stored at home. NOTE: This sheet is a summary. It may not cover all possible information. If you have questions about this medicine, talk to your doctor, pharmacist, or health care provider.  2024 Elsevier/Gold Standard (2023-02-22 00:00:00)       To help prevent nausea and vomiting after your treatment, we encourage you to take your nausea medication as directed.  BELOW ARE SYMPTOMS THAT SHOULD BE REPORTED IMMEDIATELY: *FEVER GREATER THAN 100.4  F (38 C) OR HIGHER *CHILLS OR SWEATING *NAUSEA AND VOMITING THAT IS NOT CONTROLLED WITH YOUR NAUSEA MEDICATION *UNUSUAL SHORTNESS OF BREATH *UNUSUAL BRUISING OR BLEEDING *URINARY PROBLEMS (pain or burning when urinating, or frequent urination) *BOWEL PROBLEMS (unusual diarrhea, constipation, pain near the anus) TENDERNESS IN MOUTH AND THROAT WITH OR WITHOUT PRESENCE OF ULCERS (sore throat, sores in mouth, or a toothache) UNUSUAL RASH, SWELLING OR PAIN  UNUSUAL VAGINAL DISCHARGE OR ITCHING   Items with * indicate a potential emergency and should be followed up as soon as possible or go to the Emergency  Department if any problems should occur.  Please show the CHEMOTHERAPY ALERT CARD or IMMUNOTHERAPY ALERT CARD at check-in to the Emergency Department and triage nurse.  Should you have questions after your visit or need to cancel or reschedule your appointment, please contact [facility] [place] - A DEPT OF [facility] [phone]  and follow the prompts.  Office hours are 8:00 a.m. to 4:30 p.m. Monday - Friday. Please note that voicemails left after 4:00 p.m. may not be returned until the following business day.  We are closed weekends and major holidays. You have access to a nurse at all times for urgent questions. Please call the main number to the clinic [phone] and follow the prompts.  For any non-urgent questions, you may also contact your provider using MyChart. We now offer e-Visits for anyone 88 and older to request care online for non-urgent symptoms. For details visit [email or web address].   Also download the MyChart app! Go to the app store, search "MyChart", open the app, select [place], and log in with your MyChart username and password.

## 2024-07-18 ENCOUNTER — Inpatient Hospital Stay

## 2024-07-18 VITALS — BP 109/55 | HR 83 | Temp 97.9°F | Resp 18

## 2024-07-18 DIAGNOSIS — C9310 Chronic myelomonocytic leukemia not having achieved remission: Secondary | ICD-10-CM

## 2024-07-18 DIAGNOSIS — Z5111 Encounter for antineoplastic chemotherapy: Secondary | ICD-10-CM | POA: Diagnosis not present

## 2024-07-18 MED ORDER — SODIUM CHLORIDE 0.9 % IV SOLN
75.0000 mg/m2 | Freq: Once | INTRAVENOUS | Status: AC
  Administered 2024-07-18: 116 mg via INTRAVENOUS
  Filled 2024-07-18: qty 11.6

## 2024-07-18 MED ORDER — ONDANSETRON HCL 4 MG/2ML IJ SOLN
8.0000 mg | Freq: Once | INTRAMUSCULAR | Status: AC
  Administered 2024-07-18: 8 mg via INTRAVENOUS
  Filled 2024-07-18: qty 4

## 2024-07-18 MED ORDER — SODIUM CHLORIDE 0.9 % IV SOLN: INTRAVENOUS | Status: DC

## 2024-07-18 NOTE — Progress Notes (Signed)
Patient presents today for Vidaza infusion per providers order.  Vital signs within parameters for treatment.  Patient has no new complaints at this time.  Treatment given today per MD orders.  Stable during infusion without adverse affects.  Vital signs stable.  No complaints at this time.  Discharge from clinic ambulatory in stable condition.  Alert and oriented X 3.  Follow up with [facility] as scheduled.

## 2024-07-18 NOTE — Patient Instructions (Signed)
 [facility] [place] [facility]  Discharge Instructions: Thank you for choosing [facility] to provide your oncology and hematology care.  If you have a lab appointment with the Cancer Center - please note that after April 8th, 2024, all labs will be drawn in the cancer center.  You do not have to check in or register with the main entrance as you have in the past but will complete your check-in in the cancer center.  Wear comfortable clothing and clothing appropriate for easy access to any Portacath or PICC line.   We strive to give you quality time with your provider. You may need to reschedule your appointment if you arrive late (15 or more minutes).  Arriving late affects you and other patients whose appointments are after yours.  Also, if you miss three or more appointments without notifying the office, you may be dismissed from the clinic at the provider's discretion.      For prescription refill requests, have your pharmacy contact our office and allow 72 hours for refills to be completed.    Today you received the following chemotherapy and/or immunotherapy agents Vidaza      To help prevent nausea and vomiting after your treatment, we encourage you to take your nausea medication as directed.  BELOW ARE SYMPTOMS THAT SHOULD BE REPORTED IMMEDIATELY: *FEVER GREATER THAN 100.4 F (38 C) OR HIGHER *CHILLS OR SWEATING *NAUSEA AND VOMITING THAT IS NOT CONTROLLED WITH YOUR NAUSEA MEDICATION *UNUSUAL SHORTNESS OF BREATH *UNUSUAL BRUISING OR BLEEDING *URINARY PROBLEMS (pain or burning when urinating, or frequent urination) *BOWEL PROBLEMS (unusual diarrhea, constipation, pain near the anus) TENDERNESS IN MOUTH AND THROAT WITH OR WITHOUT PRESENCE OF ULCERS (sore throat, sores in mouth, or a toothache) UNUSUAL RASH, SWELLING OR PAIN  UNUSUAL VAGINAL DISCHARGE OR ITCHING   Items with * indicate a potential emergency and should be followed up as  soon as possible or go to the Emergency Department if any problems should occur.  Please show the CHEMOTHERAPY ALERT CARD or IMMUNOTHERAPY ALERT CARD at check-in to the Emergency Department and triage nurse.  Should you have questions after your visit or need to cancel or reschedule your appointment, please contact [facility] CANCER CTR [place] - A DEPT OF Eligha Bridegroom [facility] [phone]  and follow the prompts.  Office hours are 8:00 a.m. to 4:30 p.m. Monday - Friday. Please note that voicemails left after 4:00 p.m. may not be returned until the following business day.  We are closed weekends and major holidays. You have access to a nurse at all times for urgent questions. Please call the main number to the clinic [phone] and follow the prompts.  For any non-urgent questions, you may also contact your provider using MyChart. We now offer e-Visits for anyone 72 and older to request care online for non-urgent symptoms. For details visit [email or web address].   Also download the MyChart app! Go to the app store, search "MyChart", open the app, select [place], and log in with your MyChart username and password.

## 2024-07-19 ENCOUNTER — Other Ambulatory Visit: Payer: Self-pay | Admitting: Internal Medicine

## 2024-07-19 DIAGNOSIS — F411 Generalized anxiety disorder: Secondary | ICD-10-CM

## 2024-07-20 ENCOUNTER — Ambulatory Visit

## 2024-07-20 VITALS — BP 114/61 | HR 92 | Temp 97.2°F | Resp 16 | Ht 66.0 in | Wt 113.2 lb

## 2024-07-20 DIAGNOSIS — Z79899 Other long term (current) drug therapy: Secondary | ICD-10-CM

## 2024-07-20 DIAGNOSIS — M059 Rheumatoid arthritis with rheumatoid factor, unspecified: Secondary | ICD-10-CM

## 2024-07-20 DIAGNOSIS — C9310 Chronic myelomonocytic leukemia not having achieved remission: Secondary | ICD-10-CM | POA: Diagnosis not present

## 2024-07-20 NOTE — Progress Notes (Deleted)
 "  Office Visit Note  Patient: Laurie Wallace             Date of Birth: 04-25-1946           MRN: [id number]             PCP: [name], MD Referring: [name], MD Visit Date: 07/20/2024 Occupation: Data Unavailable  Subjective:  No chief complaint on file.   History of Present Illness: Laurie Wallace is a 79 y.o. female ***     Activities of Daily Living:  Patient reports morning stiffness for *** {minute/hour:19697}.   Patient {ACTIONS;DENIES/REPORTS:21021675::Denies} nocturnal pain.  Difficulty dressing/grooming: {ACTIONS;DENIES/REPORTS:21021675::Denies} Difficulty climbing stairs: {ACTIONS;DENIES/REPORTS:21021675::Denies} Difficulty getting out of chair: {ACTIONS;DENIES/REPORTS:21021675::Denies} Difficulty using hands for taps, buttons, cutlery, and/or writing: {ACTIONS;DENIES/REPORTS:21021675::Denies}  Review of Systems  Constitutional:  Negative for fatigue.  HENT:  Positive for mouth sores and mouth dryness.   Eyes:  Positive for dryness.  Respiratory:  Positive for shortness of breath.   Cardiovascular:  Negative for chest pain and palpitations.  Gastrointestinal:  Positive for constipation. Negative for blood in stool and diarrhea.  Endocrine: Negative for increased urination.  Genitourinary:  Negative for involuntary urination.  Musculoskeletal:  Positive for joint pain, joint pain and muscle tenderness. Negative for gait problem, joint swelling, myalgias, muscle weakness, morning stiffness and myalgias.  Skin:  Negative for color change, rash, hair loss and sensitivity to sunlight.  Allergic/Immunologic: Positive for susceptible to infections.  Neurological:  Positive for dizziness. Negative for headaches.  Hematological:  Negative for swollen glands.  Psychiatric/Behavioral:  Positive for depressed mood. Negative for sleep disturbance. The patient is nervous/anxious.     PMFS History:  Patient Active Problem List    Diagnosis Date Noted   Upper airway cough syndrome 06/06/2024   Chronic myelomonocytic leukemia not having achieved remission (HCC) 05/23/2024   GAD (generalized anxiety disorder) 05/23/2024   Generalized weakness 04/28/2024   S/P insertion of endovascular thoracic aortic stent graft 04/12/2024   Aortic thrombus (HCC) 04/12/2024   COVID-19 11/08/2023   Acute non-recurrent maxillary sinusitis 11/08/2023   Lupus anticoagulant positive 10/13/2023   Dizziness 10/13/2023   Pulmonary embolism (HCC) 09/29/2023   Pulmonary nodule 1 cm or greater in diameter 06/02/2023   Microcytic anemia 02/17/2023   Hospital discharge follow-up 01/18/2023   Gastroesophageal reflux disease 01/18/2023   Gait disturbance 01/18/2023   Nonintractable episodic headache 01/18/2023   Infectious colitis 01/10/2023   Hypokalemia 01/10/2023   Thrombocytosis 01/10/2023   Subcutaneous cyst 07/20/2022   Allergic sinusitis 01/14/2022   Encounter for general adult medical examination with abnormal findings 07/17/2021   Age-related osteoporosis without current pathological fracture 07/17/2021   Muscle cramps 02/28/2021   High risk medication use 12/26/2020   Seropositive rheumatoid arthritis (HCC) 10/25/2020   Moderate protein-calorie malnutrition 10/25/2020   Cervical spine pain 10/23/2020   Lumbar pain 10/23/2020   Other intervertebral disc displacement, lumbar region 04/07/2010    Past Medical History:  Diagnosis Date   Allergy    Anemia    Iron  Deficiency   Arthritis    Rheumatoid Arthritis   Family history of adverse reaction to anesthesia    Sister has N/V   GERD (gastroesophageal reflux disease)    Peripheral vascular disease    Pneumonia     Family History  Problem Relation Age of Onset   Cancer Mother    Cancer Father    Heart failure Sister    COPD Sister    Dementia Sister  Past Surgical History:  Procedure Laterality Date   ABDOMINAL HYSTERECTOMY  2000   IR BONE MARROW BIOPSY &  ASPIRATION  05/03/2024   IR IMAGING GUIDED PORT INSERTION  06/19/2024   THORACIC AORTIC ENDOVASCULAR STENT GRAFT N/A 04/12/2024   Procedure: INSERTION, ENDOVASCULAR STENT GRAFT, AORTA, THORACIC;  Surgeon: Magda Debby SAILOR, MD;  Location: [facility] OR;  Service: Vascular;  Laterality: N/A;   TOE SURGERY Right 2015   1st toe   ULTRASOUND GUIDANCE FOR VASCULAR ACCESS Right 04/12/2024   Procedure: ULTRASOUND GUIDANCE, FOR VASCULAR ACCESS;  Surgeon: Magda Debby SAILOR, MD;  Location: [facility];  Service: Vascular;  Laterality: Right;   Social History[1] Social History   Social History Narrative   Lives with her son   Right Handed   Drinks 1-2 cups caffeine daily in winter     Immunization History  Administered Date(s) Administered   PNEUMOCOCCAL CONJUGATE-20 02/28/2021     Objective: Vital Signs: There were no vitals taken for this visit.   Physical Exam   Musculoskeletal Exam: ***  CDAI Exam: CDAI Score: -- Patient Global: --; Provider Global: -- Swollen: --; Tender: -- Joint Exam 07/20/2024   No joint exam has been documented for this visit   There is currently no information documented on the homunculus. Go to the Rheumatology activity and complete the homunculus joint exam.  Investigation: No additional findings.  Imaging: No results found.  Recent Labs: Lab Results  Component Value Date   WBC 15.2 (H) 07/17/2024   HGB 8.6 (L) 07/17/2024   PLT 965 (HH) 07/17/2024   NA 139 07/17/2024   K 4.3 07/17/2024   CL 106 07/17/2024   CO2 21 (L) 07/17/2024   GLUCOSE 81 07/17/2024   BUN 10 07/17/2024   CREATININE 0.97 07/17/2024   BILITOT 0.3 07/17/2024   ALKPHOS 91 07/17/2024   AST 22 07/17/2024   ALT 9 07/17/2024   PROT 6.2 (L) 07/17/2024   ALBUMIN  3.9 07/17/2024   CALCIUM 8.7 (L) 07/17/2024   GFRAA 88 09/28/2014    Speciality Comments: No specialty comments available.  Procedures:  No procedures performed Allergies: Patient has no known allergies.   Assessment /  Plan:     Visit Diagnoses: No diagnosis found.  Orders: No orders of the defined types were placed in this encounter.  No orders of the defined types were placed in this encounter.   Face-to-face time spent with patient was *** minutes. Greater than 50% of time was spent in counseling and coordination of care.  Follow-Up Instructions: No follow-ups on file.   Hadassah Castleman, CMA  Note - This record has been created using [employer].  Chart creation errors have been sought, but may not always  have been located. Such creation errors do not reflect on  the standard of medical care.     [1]  Social History Tobacco Use   Smoking status: Never    Passive exposure: Never   Smokeless tobacco: Never  Vaping Use   Vaping status: Never Used  Substance Use Topics   Alcohol use: No   Drug use: No   "

## 2024-07-20 NOTE — Assessment & Plan Note (Signed)
 This patient is on drug therapy requiring intensive monitoring for toxicity, including regular lab monitoring and screening for serious or recurrent infections. Today, I assessed for side effects, infections, new or worsening health conditions that may be related to the medications, and reviewed and interpreted CBC and CMP obtained by outside provider.

## 2024-07-20 NOTE — Assessment & Plan Note (Signed)
 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 Discussed with her oncologist via staff message to ensure methotrexate  was not contraindicated. Her port is in place and without drainage or infection. There was a small scab that she said has been present since she got it. I reapplied a dressing for her comfort as the scab was rubbing against her clothes. She recently had blood work done on 07/18/2023 and is being closely monitored. Discussed that the bone pain she is experiencing could be a symptom of her cancer or a side effect from the chemotherapy. She will bring it up to her oncologist at her next appointment.

## 2024-07-20 NOTE — Progress Notes (Signed)
 "  Office Visit Note  Patient: Laurie Wallace             Date of Birth: 05/21/1946           MRN: [id number]             Visit Date: 07/20/2024  PCP: [name], MD   Subjective:   Chief Complaint: Rheumatoid Arthritis (Follow up patient states having pain in both legs.)   History of Present Illness: Laurie Wallace is a 79 y.o. female with PMH of CMML who returns today for follow up of seropositive rheumatoid arthritis. She is in good spirits today. She is taking methotrexate  15 mg weekly and folic acid  1 mg daily. She is tolerating medications without any side effects. She denies any recent infections. She is not having significant joint pain or swelling.Of note, she is currently receiving treatment for CMML at the cancer center. Her current treatment regimen is azacitidine  75 mg/m days 1-7 every 28-day cycle. She endorses bone pain in her thighs and shins that has been ongoing over the past few days. She took a dose of tylenol  this morning but cannot tell if it helped or not.  Previous Medication Trials: none.  Last Labs: 07/17/2024- Normal creatinine, normal LFTs, WBCs 15.2, hemoglobin 8.6, platelets 585 03/27/2024 sed rate 46  Review of Systems: Review of Systems  Constitutional:  Negative for fatigue.  HENT:  Positive for mouth sores and mouth dryness.   Eyes:  Positive for dryness.  Respiratory:  Positive for shortness of breath.   Cardiovascular:  Negative for chest pain and palpitations.  Gastrointestinal:  Positive for constipation. Negative for blood in stool and diarrhea.  Endocrine: Negative for increased urination.  Genitourinary:  Negative for involuntary urination.  Musculoskeletal:  Positive for joint pain, joint pain and muscle tenderness. Negative for gait problem, joint swelling, myalgias, muscle weakness, morning stiffness and myalgias.  Skin:  Negative for color change, rash, hair loss and sensitivity to sunlight.  Allergic/Immunologic:  Positive for susceptible to infections.  Neurological:  Positive for dizziness. Negative for headaches.  Hematological:  Negative for swollen glands.  Psychiatric/Behavioral:  Positive for depressed mood. Negative for sleep disturbance. The patient is nervous/anxious.    Medication List: Current Medications[1]   Allergies:  Patient has no known allergies.  Immunization status:  Immunization History  Administered Date(s) Administered   PNEUMOCOCCAL CONJUGATE-20 02/28/2021    Problem List:  Patient Active Problem List   Diagnosis Date Noted   Upper airway cough syndrome 06/06/2024   Chronic myelomonocytic leukemia not having achieved remission (HCC) 05/23/2024   GAD (generalized anxiety disorder) 05/23/2024   Generalized weakness 04/28/2024   S/P insertion of endovascular thoracic aortic stent graft 04/12/2024   Aortic thrombus (HCC) 04/12/2024   COVID-19 11/08/2023   Acute non-recurrent maxillary sinusitis 11/08/2023   Lupus anticoagulant positive 10/13/2023   Dizziness 10/13/2023   Pulmonary embolism (HCC) 09/29/2023   Pulmonary nodule 1 cm or greater in diameter 06/02/2023   Microcytic anemia 02/17/2023   Hospital discharge follow-up 01/18/2023   Gastroesophageal reflux disease 01/18/2023   Gait disturbance 01/18/2023   Nonintractable episodic headache 01/18/2023   Infectious colitis 01/10/2023   Hypokalemia 01/10/2023   Thrombocytosis 01/10/2023   Subcutaneous cyst 07/20/2022   Allergic sinusitis 01/14/2022   Encounter for general adult medical examination with abnormal findings 07/17/2021   Age-related osteoporosis without current pathological fracture 07/17/2021   Muscle cramps 02/28/2021   High risk medication use 12/26/2020   Seropositive rheumatoid arthritis (  HCC) 10/25/2020   Moderate protein-calorie malnutrition 10/25/2020   Cervical spine pain 10/23/2020   Lumbar pain 10/23/2020   Other intervertebral disc displacement, lumbar region 04/07/2010    History: Past Medical History:  Diagnosis Date   Allergy    Anemia    Iron  Deficiency   Arthritis    Rheumatoid Arthritis   Family history of adverse reaction to anesthesia    Sister has N/V   GERD (gastroesophageal reflux disease)    Peripheral vascular disease    Pneumonia     Family History  Problem Relation Age of Onset   Cancer Mother    Cancer Father    Heart failure Sister    COPD Sister    Dementia Sister    Past Surgical History:  Procedure Laterality Date   ABDOMINAL HYSTERECTOMY  2000   IR BONE MARROW BIOPSY & ASPIRATION  05/03/2024   IR IMAGING GUIDED PORT INSERTION  06/19/2024   THORACIC AORTIC ENDOVASCULAR STENT GRAFT N/A 04/12/2024   Procedure: INSERTION, ENDOVASCULAR STENT GRAFT, AORTA, THORACIC;  Surgeon: Magda Debby SAILOR, MD;  Location: [facility] OR;  Service: Vascular;  Laterality: N/A;   TOE SURGERY Right 2015   1st toe   ULTRASOUND GUIDANCE FOR VASCULAR ACCESS Right 04/12/2024   Procedure: ULTRASOUND GUIDANCE, FOR VASCULAR ACCESS;  Surgeon: Magda Debby SAILOR, MD;  Location: [facility] OR;  Service: Vascular;  Laterality: Right;   Social History   Social History Narrative   Lives with her son   Right Handed   Drinks 1-2 cups caffeine daily in winter   Objective: Vital Signs: BP 114/61   Pulse 92   Temp (!) 97.2 F (36.2 C)   Resp 16   Ht 5' 6 (1.676 m)   Wt 113 lb 3.2 oz (51.3 kg)   BMI 18.27 kg/m   Physical Exam Vitals reviewed.  Constitutional:      General: She is not in acute distress.    Appearance: Normal appearance. She is well-developed and normal weight.  HENT:     Head: Normocephalic and atraumatic. No right periorbital erythema or left periorbital erythema.     Mouth/Throat:     Mouth: Mucous membranes are moist.     Pharynx: Oropharynx is clear.  Eyes:     General: Lids are normal.        Right eye: No discharge.        Left eye: No discharge.     Extraocular Movements: Extraocular movements intact.     Conjunctiva/sclera:  Conjunctivae normal.     Pupils: Pupils are equal, round, and reactive to light.  Cardiovascular:     Rate and Rhythm: Normal rate and regular rhythm.     Heart sounds: Normal heart sounds. No murmur heard.    No friction rub. No gallop.  Pulmonary:     Effort: Pulmonary effort is normal. No respiratory distress.     Breath sounds: Normal breath sounds. No stridor. No wheezing, rhonchi or rales.  Chest:     Chest wall: No deformity.     Comments: Port in place with overlying healing scab.  Musculoskeletal:     Right lower leg: No edema.     Left lower leg: No edema.     Comments: No synovitis or tenderness present today. No SI joint tenderness. Shoulder joints, elbow joints, wrist joints, MCPs, PIPs, DIPs have good range of motion. Complete fist formation bilaterally. Knee joints have good range of motion with no warmth, effusion, or crepitus. Ankle joints have good  range of motion with no tenderness or joint swelling. No evidence of Achilles tendinitis or plantar fasciitis. Negative MTP squeeze.   Lymphadenopathy:     Cervical: No cervical adenopathy.  Skin:    General: Skin is warm and moist.     Capillary Refill: Capillary refill takes less than 2 seconds.     Findings: No rash.  Neurological:     Mental Status: She is alert.     Gait: Gait normal.  Psychiatric:        Mood and Affect: Mood normal.        Behavior: Behavior normal.        CDAI Score: CDAI Score: 2  Patient Global: 10 / 100; Provider Global: 10 / 100 Swollen: 0 ; Tender: 0   Imaging: No results found. Assessment & Plan Seropositive rheumatoid arthritis (HCC) Rheumatoid arthritis is well-controlled with methotrexate . CDAI score 2. No synovitis or joint tenderness on exam today. No recent flare ups noted. Will recheck sed rate at next visit.  -Continue methotrexate  15 mg po weekly and folic acid  11 mg daily.   High risk medication use This patient is on drug therapy requiring intensive monitoring for  toxicity, including regular lab monitoring and screening for serious or recurrent infections. Today, I assessed for side effects, infections, new or worsening health conditions that may be related to the medications, and reviewed and interpreted CBC and CMP obtained by outside provider. 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 Discussed with her oncologist via staff message to ensure methotrexate  was not contraindicated. Her port is in place and without drainage or infection. There was a small scab that she said has been present since she got it. I reapplied a dressing for her comfort as the scab was rubbing against her clothes. She recently had blood work done on 07/18/2023 and is being closely monitored. Discussed that the bone pain she is experiencing could be a symptom of her cancer or a side effect from the chemotherapy. She will bring it up to her oncologist at her next appointment.  Follow-Up Instructions:  Return in about 3 months (around 10/18/2024) for RA f/u.   Procedures: No procedures performed  Daved GORMAN Holstein, PA-C  Note - This record has been created using [employer]. Chart creation errors have been sought, but may not always have been located. Such creation errors do not reflect on the standard of medical care.      [1]  Current Outpatient Medications:    allopurinol  (ZYLOPRIM ) 100 MG tablet, Take 1 tablet (100 mg total) by mouth daily., Disp: 30 tablet, Rfl: 1   apixaban  (ELIQUIS ) 5 MG TABS tablet, Take 1 tablet (5 mg total) by mouth 2 (two) times daily., Disp: 60 tablet, Rfl: 3   aspirin  EC 81 MG tablet, Take 81 mg by mouth daily. Swallow whole., Disp: , Rfl:    azaCITIDine  5 mg/2 mLs in lactated ringers  infusion, Inject into the vein every 28 (twenty-eight) days., Disp: , Rfl:    famotidine  (PEPCID ) 40 MG tablet, TAKE ONE TABLET BY MOUTH DAILY, Disp: 90 tablet, Rfl: 1   folic acid  (FOLVITE ) 1 MG  tablet, Take 1 tablet (1 mg total) by mouth daily., Disp: 90 tablet, Rfl: 3   hydrOXYzine  (ATARAX ) 10 MG tablet, Take 1 tablet (10 mg total) by mouth 2 (two) times daily as needed for anxiety., Disp: 30 tablet, Rfl: 2   methotrexate  (RHEUMATREX) 2.5 MG tablet, Take 6 tablets (15  mg total) by mouth once a week. Caution:Chemotherapy. Protect from light., Disp: 72 tablet, Rfl: 0   ondansetron  (ZOFRAN ) 4 MG tablet, Take 1 tablet (4 mg total) by mouth every 8 (eight) hours as needed for nausea or vomiting., Disp: 20 tablet, Rfl: 0   ondansetron  (ZOFRAN ) 8 MG tablet, Take 1 tablet (8 mg total) by mouth every 8 (eight) hours as needed for nausea or vomiting., Disp: 30 tablet, Rfl: 1   prochlorperazine  (COMPAZINE ) 10 MG tablet, Take 1 tablet (10 mg total) by mouth every 6 (six) hours as needed for nausea or vomiting., Disp: 30 tablet, Rfl: 1   lidocaine -prilocaine  (EMLA ) cream, Apply to affected area once (Patient not taking: Reported on 07/20/2024), Disp: 30 g, Rfl: 3 No current facility-administered medications for this visit.  Facility-Administered Medications Ordered in Other Visits:    0.9 %  sodium chloride  infusion, , Intravenous, Continuous, Davonna Siad, MD, Last Rate: 10 mL/hr at 06/16/24 1136, New Bag at 06/16/24 1136  "

## 2024-07-20 NOTE — Assessment & Plan Note (Signed)
 Rheumatoid arthritis is well-controlled with methotrexate . CDAI score 2. No synovitis or joint tenderness on exam today. No recent flare ups noted. Will recheck sed rate at next visit.  -Continue methotrexate  15 mg po weekly and folic acid  11 mg daily.

## 2024-07-28 NOTE — Addendum Note (Signed)
 Encounter addended by: Janice Lynwood BROCKS on: 07/28/2024 8:27 AM  Actions taken: Imaging Exam ended

## 2024-07-31 ENCOUNTER — Other Ambulatory Visit: Payer: Self-pay | Admitting: Oncology

## 2024-07-31 DIAGNOSIS — C9310 Chronic myelomonocytic leukemia not having achieved remission: Secondary | ICD-10-CM

## 2024-08-03 ENCOUNTER — Inpatient Hospital Stay: Admitting: Licensed Clinical Social Worker

## 2024-08-03 ENCOUNTER — Other Ambulatory Visit: Payer: Self-pay | Admitting: Oncology

## 2024-08-03 DIAGNOSIS — C9310 Chronic myelomonocytic leukemia not having achieved remission: Secondary | ICD-10-CM

## 2024-08-03 NOTE — Progress Notes (Signed)
 [facility] CSW Progress Note   Interventions: CSW received a call from pt's niece who expressed concerns regarding pt's support at home.  [place].  CSW requested chaplain follow up in person w/ pt at appointment on Monday 2/2 to check how she is managing independently and discuss supportive possibilities if appropriate.       Follow Up Plan:  Patient will contact CSW with any support or resource needs    Laurie JONELLE Manna, LCSW Clinical Social Worker [facility]

## 2024-08-06 ENCOUNTER — Encounter: Payer: Self-pay | Admitting: *Deleted

## 2024-08-07 ENCOUNTER — Inpatient Hospital Stay

## 2024-08-07 ENCOUNTER — Inpatient Hospital Stay: Admitting: Oncology

## 2024-08-08 ENCOUNTER — Inpatient Hospital Stay

## 2024-08-08 ENCOUNTER — Inpatient Hospital Stay: Admitting: Oncology

## 2024-08-08 VITALS — BP 101/39 | HR 85 | Temp 96.9°F | Resp 18

## 2024-08-08 VITALS — Ht 66.0 in | Wt 112.4 lb

## 2024-08-08 DIAGNOSIS — E790 Hyperuricemia without signs of inflammatory arthritis and tophaceous disease: Secondary | ICD-10-CM

## 2024-08-08 DIAGNOSIS — D75839 Thrombocytosis, unspecified: Secondary | ICD-10-CM

## 2024-08-08 DIAGNOSIS — C9310 Chronic myelomonocytic leukemia not having achieved remission: Secondary | ICD-10-CM

## 2024-08-08 DIAGNOSIS — R634 Abnormal weight loss: Secondary | ICD-10-CM

## 2024-08-08 LAB — CBC WITH DIFFERENTIAL/PLATELET
Abs Immature Granulocytes: 1.42 10*3/uL — ABNORMAL HIGH (ref 0.00–0.07)
Basophils Absolute: 0 10*3/uL (ref 0.0–0.1)
Basophils Relative: 0 %
Eosinophils Absolute: 0.6 10*3/uL — ABNORMAL HIGH (ref 0.0–0.5)
Eosinophils Relative: 5 %
HCT: 28 % — ABNORMAL LOW (ref 36.0–46.0)
Hemoglobin: 8.8 g/dL — ABNORMAL LOW (ref 12.0–15.0)
Immature Granulocytes: 12 %
Lymphocytes Relative: 27 %
Lymphs Abs: 3.4 10*3/uL (ref 0.7–4.0)
MCH: 33 pg (ref 26.0–34.0)
MCHC: 31.4 g/dL (ref 30.0–36.0)
MCV: 104.9 fL — ABNORMAL HIGH (ref 80.0–100.0)
Monocytes Absolute: 3.8 10*3/uL — ABNORMAL HIGH (ref 0.1–1.0)
Monocytes Relative: 31 %
Neutro Abs: 3.1 10*3/uL (ref 1.7–7.7)
Neutrophils Relative %: 25 %
Platelets: 1402 10*3/uL (ref 150–400)
RBC: 2.67 MIL/uL — ABNORMAL LOW (ref 3.87–5.11)
RDW: 21.8 % — ABNORMAL HIGH (ref 11.5–15.5)
WBC: 12.3 10*3/uL — ABNORMAL HIGH (ref 4.0–10.5)
nRBC: 0.8 % — ABNORMAL HIGH (ref 0.0–0.2)

## 2024-08-08 LAB — COMPREHENSIVE METABOLIC PANEL WITH GFR
ALT: 11 U/L (ref 0–44)
AST: 20 U/L (ref 15–41)
Albumin: 4 g/dL (ref 3.5–5.0)
Alkaline Phosphatase: 134 U/L — ABNORMAL HIGH (ref 38–126)
Anion gap: 14 (ref 5–15)
BUN: 8 mg/dL (ref 8–23)
CO2: 20 mmol/L — ABNORMAL LOW (ref 22–32)
Calcium: 8.9 mg/dL (ref 8.9–10.3)
Chloride: 104 mmol/L (ref 98–111)
Creatinine, Ser: 0.96 mg/dL (ref 0.44–1.00)
GFR, Estimated: >60 mL/min
Glucose, Bld: 88 mg/dL (ref 70–99)
Potassium: 4.2 mmol/L (ref 3.5–5.1)
Sodium: 137 mmol/L (ref 135–145)
Total Bilirubin: 0.4 mg/dL (ref 0.0–1.2)
Total Protein: 6.7 g/dL (ref 6.5–8.1)

## 2024-08-08 LAB — MAGNESIUM: Magnesium: 2.1 mg/dL (ref 1.7–2.4)

## 2024-08-08 MED ORDER — SODIUM CHLORIDE 0.9 % IV SOLN
75.0000 mg/m2 | Freq: Once | INTRAVENOUS | Status: AC
  Administered 2024-08-08: 116 mg via INTRAVENOUS
  Filled 2024-08-08: qty 11.6

## 2024-08-08 MED ORDER — ONDANSETRON HCL 4 MG/2ML IJ SOLN
8.0000 mg | Freq: Once | INTRAMUSCULAR | Status: AC
  Administered 2024-08-08: 8 mg via INTRAVENOUS
  Filled 2024-08-08: qty 4

## 2024-08-08 MED ORDER — SODIUM CHLORIDE 0.9 % IV SOLN: INTRAVENOUS | Status: DC

## 2024-08-08 NOTE — Progress Notes (Signed)
 CRITICAL VALUE ALERT Critical value received:  platelets 1,402 Date of notification:  08-08-24 Time of notification: 1215 Critical value read back:  Yes.   Nurse who received alert:  C. PageRN MD notified time and response:  1216, Dr. Davonna

## 2024-08-08 NOTE — Patient Instructions (Signed)
 [facility] [place] - A DEPT OF [facility]  Discharge Instructions: Thank you for choosing [facility] to provide your oncology and hematology care.  If you have a lab appointment with the Cancer Center - please note that after April 8th, 2024, all labs will be drawn in the cancer center.  You do not have to check in or register with the main entrance as you have in the past but will complete your check-in in the cancer center.  Wear comfortable clothing and clothing appropriate for easy access to any Portacath or PICC line.   We strive to give you quality time with your provider. You may need to reschedule your appointment if you arrive late (15 or more minutes).  Arriving late affects you and other patients whose appointments are after yours.  Also, if you miss three or more appointments without notifying the office, you may be dismissed from the clinic at the provider's discretion.      For prescription refill requests, have your pharmacy contact our office and allow 72 hours for refills to be completed.    Today you received the following chemotherapy and/or immunotherapy agents vidaza.       To help prevent nausea and vomiting after your treatment, we encourage you to take your nausea medication as directed.  BELOW ARE SYMPTOMS THAT SHOULD BE REPORTED IMMEDIATELY: *FEVER GREATER THAN 100.4 F (38 C) OR HIGHER *CHILLS OR SWEATING *NAUSEA AND VOMITING THAT IS NOT CONTROLLED WITH YOUR NAUSEA MEDICATION *UNUSUAL SHORTNESS OF BREATH *UNUSUAL BRUISING OR BLEEDING *URINARY PROBLEMS (pain or burning when urinating, or frequent urination) *BOWEL PROBLEMS (unusual diarrhea, constipation, pain near the anus) TENDERNESS IN MOUTH AND THROAT WITH OR WITHOUT PRESENCE OF ULCERS (sore throat, sores in mouth, or a toothache) UNUSUAL RASH, SWELLING OR PAIN  UNUSUAL VAGINAL DISCHARGE OR ITCHING   Items with * indicate a potential emergency and should be followed up  as soon as possible or go to the Emergency Department if any problems should occur.  Please show the CHEMOTHERAPY ALERT CARD or IMMUNOTHERAPY ALERT CARD at check-in to the Emergency Department and triage nurse.  Should you have questions after your visit or need to cancel or reschedule your appointment, please contact [facility] [place] - A DEPT OF Eligha Brideg[facility] [phone]  and follow the prompts.  Office hours are 8:00 a.m. to 4:30 p.m. Monday - Friday. Please note that voicemails left after 4:00 p.m. may not be returned until the following business day.  We are closed weekends and major holidays. You have access to a nurse at all times for urgent questions. Please call the main number to the clinic [phone] and follow the prompts.  For any non-urgent questions, you may also contact your provider using MyChart. We now offer e-Visits for anyone 8 and older to request care online for non-urgent symptoms. For details visit [email or web address].   Also download the MyChart app! Go to the app store, search "MyChart", open the app, select [place], and log in with your MyChart username and password.

## 2024-08-08 NOTE — Patient Instructions (Signed)

## 2024-08-08 NOTE — Progress Notes (Signed)

## 2024-08-09 ENCOUNTER — Inpatient Hospital Stay

## 2024-08-09 VITALS — BP 109/35 | HR 72 | Temp 98.3°F | Resp 16

## 2024-08-09 DIAGNOSIS — C9310 Chronic myelomonocytic leukemia not having achieved remission: Secondary | ICD-10-CM

## 2024-08-09 MED ORDER — SODIUM CHLORIDE 0.9 % IV SOLN: INTRAVENOUS | Status: DC

## 2024-08-09 MED ORDER — ONDANSETRON HCL 4 MG/2ML IJ SOLN
8.0000 mg | Freq: Once | INTRAMUSCULAR | Status: AC
  Administered 2024-08-09: 8 mg via INTRAVENOUS
  Filled 2024-08-09: qty 4

## 2024-08-09 MED ORDER — SODIUM CHLORIDE 0.9 % IV SOLN
75.0000 mg/m2 | Freq: Once | INTRAVENOUS | Status: AC
  Administered 2024-08-09: 116 mg via INTRAVENOUS
  Filled 2024-08-09: qty 11.6

## 2024-08-09 NOTE — Progress Notes (Signed)
 Patient presents today for Vidaza  D2 C3. Heart rate 105 on arrival. Recheck. Blood pressure 111/62 today. Heart rate recheck 97.   Treatment given today per MD orders. Tolerated infusion without adverse affects. Vital signs stable. No complaints at this time. Discharged from clinic ambulatory in stable condition. Alert and oriented x 3. F/U with [facility] as scheduled.

## 2024-08-09 NOTE — Progress Notes (Signed)
 Ok to proceed with HR 105 per Dr Davonna  Niels Molt, PharmD

## 2024-08-09 NOTE — Patient Instructions (Signed)
 [facility] - A DEPT OF [facility]  Discharge Instructions: Thank you for choosing [facility] to provide your oncology and hematology care.  If you have a lab appointment with the Cancer Center - please note that after April 8th, 2024, all labs will be drawn in the cancer center.  You do not have to check in or register with the main entrance as you have in the past but will complete your check-in in the cancer center.  Wear comfortable clothing and clothing appropriate for easy access to any Portacath or PICC line.   We strive to give you quality time with your provider. You may need to reschedule your appointment if you arrive late (15 or more minutes).  Arriving late affects you and other patients whose appointments are after yours.  Also, if you miss three or more appointments without notifying the office, you may be dismissed from the clinic at the providers discretion.      For prescription refill requests, have your pharmacy contact our office and allow 72 hours for refills to be completed.    Today you received the following chemotherapy and/or immunotherapy agents Vidaza . Azacitidine  Injection What is this medication? AZACITIDINE  (ay [employer] i deen) treats blood and bone marrow cancers. It works by slowing down the growth of cancer cells. This medicine may be used for other purposes; ask your health care provider or pharmacist if you have questions. COMMON BRAND NAME(S): Vidaza  What should I tell my care team before I take this medication? They need to know if you have any of these conditions: Kidney disease Liver disease Low blood cell levels, such as low white cells, platelets, or red blood cells Low levels of albumin  in the blood Low levels of bicarbonate in the blood An unusual or allergic reaction to azacitidine , mannitol, other medications, foods, dyes, or preservatives If you or your partner are pregnant or trying to get  pregnant Breast-feeding How should I use this medication? This medication is injected into a vein or under the skin. It is given by your care team in a hospital or clinic setting. Talk to your care team about the use of this medication in children. While it may be prescribed for children as young as 1 month for selected conditions, precautions do apply. Overdosage: If you think you have taken too much of this medicine contact a poison control center or emergency room at once. NOTE: This medicine is only for you. Do not share this medicine with others. What if I miss a dose? Keep appointments for follow-up doses. It is important not to miss your dose. Call your care team if you are unable to keep an appointment. What may interact with this medication? Interactions are not expected. This list may not describe all possible interactions. Give your health care provider a list of all the medicines, herbs, non-prescription drugs, or dietary supplements you use. Also tell them if you smoke, drink alcohol, or use illegal drugs. Some items may interact with your medicine. What should I watch for while using this medication? Your condition will be monitored carefully while you are receiving this medication. This medication may make you feel generally unwell. This is not uncommon as chemotherapy can affect healthy cells as well as cancer cells. Report any side effects. Continue your course of treatment even though you feel ill unless your care team tells you to stop. You may need blood work done while you are taking  this medication. Other product types may be available that contain the medication azacitidine . The injection and oral products should not be used in place of one another. Talk to your care team if you have questions. This medication can cause serious side effects. To reduce the risk, your care team may give you other medications to take before receiving this one. Be sure to follow the directions from  your care team. This medication may increase your risk of getting an infection. Call your care team for advice if you get a fever, chills, sore throat, or other symptoms of a cold or flu. Do not treat yourself. Try to avoid being around people who are sick. Avoid taking medications that contain aspirin , acetaminophen , ibuprofen , naproxen , or ketoprofen unless instructed by your care team. These medications may hide a fever. Be careful brushing or flossing your teeth or using a toothpick because you may get an infection or bleed more easily. If you have any dental work done, tell your dentist you are receiving this medication. Talk to your care team if you or your partner may be pregnant. Serious birth defects can occur if you take this medication during pregnancy and for 6 months after the last dose. You will need a negative pregnancy test before starting this medication. Contraception is recommended while taking this medication and for 6 months after the last dose. Your care team can help you find the option that works for you. If your partner can get pregnant, use a condom during sex while taking this medication and for 3 months after the last dose. Do not breastfeed while taking this medication and for 1 week after the last dose. This medication may cause infertility. Talk to your care team if you are concerned about your fertility. What side effects may I notice from receiving this medication? Side effects that you should report to your care team as soon as possible: Allergic reactions--skin rash, itching, hives, swelling of the face, lips, tongue, or throat Infection--fever, chills, cough, sore throat, wounds that don't heal, pain or trouble when passing urine, general feeling of discomfort or being unwell Kidney injury--decrease in the amount of urine, swelling of the ankles, hands, or feet Liver injury--right upper belly pain, loss of appetite, nausea, light-colored stool, dark yellow or brown  urine, yellowing skin or eyes, unusual weakness or fatigue Low red blood cell level--unusual weakness or fatigue, dizziness, headache, trouble breathing Tumor lysis syndrome (TLS)--nausea, vomiting, diarrhea, decrease in the amount of urine, dark urine, unusual weakness or fatigue, confusion, muscle pain or cramps, fast or irregular heartbeat, joint pain Unusual bruising or bleeding Side effects that usually do not require medical attention (report these to your care team if they continue or are bothersome): Constipation Diarrhea Nausea Pain, redness, or irritation at injection site Vomiting This list may not describe all possible side effects. Call your doctor for medical advice about side effects. You may report side effects to FDA at 1-800-FDA-1088. Where should I keep my medication? This medication is given in a hospital or clinic. It will not be stored at home. NOTE: This sheet is a summary. It may not cover all possible information. If you have questions about this medicine, talk to your doctor, pharmacist, or health care provider.  2025 Elsevier/Gold Standard (2024-03-07 00:00:00)      To help prevent nausea and vomiting after your treatment, we encourage you to take your nausea medication as directed.  BELOW ARE SYMPTOMS THAT SHOULD BE REPORTED IMMEDIATELY: *FEVER GREATER THAN 100.4 F (  38 C) OR HIGHER *CHILLS OR SWEATING *NAUSEA AND VOMITING THAT IS NOT CONTROLLED WITH YOUR NAUSEA MEDICATION *UNUSUAL SHORTNESS OF BREATH *UNUSUAL BRUISING OR BLEEDING *URINARY PROBLEMS (pain or burning when urinating, or frequent urination) *BOWEL PROBLEMS (unusual diarrhea, constipation, pain near the anus) TENDERNESS IN MOUTH AND THROAT WITH OR WITHOUT PRESENCE OF ULCERS (sore throat, sores in mouth, or a toothache) UNUSUAL RASH, SWELLING OR PAIN  UNUSUAL VAGINAL DISCHARGE OR ITCHING   Items with * indicate a potential emergency and should be followed up as soon as possible or go to the  Emergency Department if any problems should occur.  Please show the CHEMOTHERAPY ALERT CARD or IMMUNOTHERAPY ALERT CARD at check-in to the Emergency Department and triage nurse.  Should you have questions after your visit or need to cancel or reschedule your appointment, please contact [facility] [place] - A DEPT OF [facility] [phone]  and follow the prompts.  Office hours are 8:00 a.m. to 4:30 p.m. Monday - Friday. Please note that voicemails left after 4:00 p.m. may not be returned until the following business day.  We are closed weekends and major holidays. You have access to a nurse at all times for urgent questions. Please call the main number to the clinic [phone] and follow the prompts.  For any non-urgent questions, you may also contact your provider using MyChart. We now offer e-Visits for anyone 15 and older to request care online for non-urgent symptoms. For details visit [email or web address].   Also download the MyChart app! Go to the app store, search MyChart, open the app, select [place], and log in with your MyChart username and password.

## 2024-08-10 ENCOUNTER — Other Ambulatory Visit: Payer: Self-pay | Admitting: *Deleted

## 2024-08-10 ENCOUNTER — Inpatient Hospital Stay

## 2024-08-10 VITALS — BP 102/60 | HR 70 | Temp 96.4°F | Resp 18

## 2024-08-10 DIAGNOSIS — C9310 Chronic myelomonocytic leukemia not having achieved remission: Secondary | ICD-10-CM

## 2024-08-10 DIAGNOSIS — K1379 Other lesions of oral mucosa: Secondary | ICD-10-CM

## 2024-08-10 MED ORDER — MAGIC MOUTHWASH W/LIDOCAINE: 10.0000 mL | Freq: Four times a day (QID) | ORAL | 0 refills | Status: AC | PRN

## 2024-08-10 MED ORDER — SODIUM CHLORIDE 0.9 % IV SOLN
75.0000 mg/m2 | Freq: Once | INTRAVENOUS | Status: AC
  Administered 2024-08-10: 116 mg via INTRAVENOUS
  Filled 2024-08-10: qty 11.6

## 2024-08-10 MED ORDER — SODIUM CHLORIDE 0.9 % IV SOLN: INTRAVENOUS | Status: DC

## 2024-08-10 MED ORDER — ONDANSETRON HCL 4 MG/2ML IJ SOLN
8.0000 mg | Freq: Once | INTRAMUSCULAR | Status: AC
  Administered 2024-08-10: 8 mg via INTRAVENOUS

## 2024-08-10 NOTE — Patient Instructions (Signed)
 [facility] - A DEPT OF [facility]  Discharge Instructions: Thank you for choosing [facility] to provide your oncology and hematology care.  If you have a lab appointment with the Cancer Center - please note that after April 8th, 2024, all labs will be drawn in the cancer center.  You do not have to check in or register with the main entrance as you have in the past but will complete your check-in in the cancer center.  Wear comfortable clothing and clothing appropriate for easy access to any Portacath or PICC line.   We strive to give you quality time with your provider. You may need to reschedule your appointment if you arrive late (15 or more minutes).  Arriving late affects you and other patients whose appointments are after yours.  Also, if you miss three or more appointments without notifying the office, you may be dismissed from the clinic at the providers discretion.      For prescription refill requests, have your pharmacy contact our office and allow 72 hours for refills to be completed.    Today you received the following chemotherapy and/or immunotherapy agents Vidaza . Azacitidine  Injection What is this medication? AZACITIDINE  (ay [employer] i deen) treats blood and bone marrow cancers. It works by slowing down the growth of cancer cells. This medicine may be used for other purposes; ask your health care provider or pharmacist if you have questions. COMMON BRAND NAME(S): Vidaza  What should I tell my care team before I take this medication? They need to know if you have any of these conditions: Kidney disease Liver disease Low blood cell levels, such as low white cells, platelets, or red blood cells Low levels of albumin  in the blood Low levels of bicarbonate in the blood An unusual or allergic reaction to azacitidine , mannitol, other medications, foods, dyes, or preservatives If you or your partner are pregnant or trying to get  pregnant Breast-feeding How should I use this medication? This medication is injected into a vein or under the skin. It is given by your care team in a hospital or clinic setting. Talk to your care team about the use of this medication in children. While it may be prescribed for children as young as 1 month for selected conditions, precautions do apply. Overdosage: If you think you have taken too much of this medicine contact a poison control center or emergency room at once. NOTE: This medicine is only for you. Do not share this medicine with others. What if I miss a dose? Keep appointments for follow-up doses. It is important not to miss your dose. Call your care team if you are unable to keep an appointment. What may interact with this medication? Interactions are not expected. This list may not describe all possible interactions. Give your health care provider a list of all the medicines, herbs, non-prescription drugs, or dietary supplements you use. Also tell them if you smoke, drink alcohol, or use illegal drugs. Some items may interact with your medicine. What should I watch for while using this medication? Your condition will be monitored carefully while you are receiving this medication. This medication may make you feel generally unwell. This is not uncommon as chemotherapy can affect healthy cells as well as cancer cells. Report any side effects. Continue your course of treatment even though you feel ill unless your care team tells you to stop. You may need blood work done while you are taking  this medication. Other product types may be available that contain the medication azacitidine . The injection and oral products should not be used in place of one another. Talk to your care team if you have questions. This medication can cause serious side effects. To reduce the risk, your care team may give you other medications to take before receiving this one. Be sure to follow the directions from  your care team. This medication may increase your risk of getting an infection. Call your care team for advice if you get a fever, chills, sore throat, or other symptoms of a cold or flu. Do not treat yourself. Try to avoid being around people who are sick. Avoid taking medications that contain aspirin , acetaminophen , ibuprofen , naproxen , or ketoprofen unless instructed by your care team. These medications may hide a fever. Be careful brushing or flossing your teeth or using a toothpick because you may get an infection or bleed more easily. If you have any dental work done, tell your dentist you are receiving this medication. Talk to your care team if you or your partner may be pregnant. Serious birth defects can occur if you take this medication during pregnancy and for 6 months after the last dose. You will need a negative pregnancy test before starting this medication. Contraception is recommended while taking this medication and for 6 months after the last dose. Your care team can help you find the option that works for you. If your partner can get pregnant, use a condom during sex while taking this medication and for 3 months after the last dose. Do not breastfeed while taking this medication and for 1 week after the last dose. This medication may cause infertility. Talk to your care team if you are concerned about your fertility. What side effects may I notice from receiving this medication? Side effects that you should report to your care team as soon as possible: Allergic reactions--skin rash, itching, hives, swelling of the face, lips, tongue, or throat Infection--fever, chills, cough, sore throat, wounds that don't heal, pain or trouble when passing urine, general feeling of discomfort or being unwell Kidney injury--decrease in the amount of urine, swelling of the ankles, hands, or feet Liver injury--right upper belly pain, loss of appetite, nausea, light-colored stool, dark yellow or brown  urine, yellowing skin or eyes, unusual weakness or fatigue Low red blood cell level--unusual weakness or fatigue, dizziness, headache, trouble breathing Tumor lysis syndrome (TLS)--nausea, vomiting, diarrhea, decrease in the amount of urine, dark urine, unusual weakness or fatigue, confusion, muscle pain or cramps, fast or irregular heartbeat, joint pain Unusual bruising or bleeding Side effects that usually do not require medical attention (report these to your care team if they continue or are bothersome): Constipation Diarrhea Nausea Pain, redness, or irritation at injection site Vomiting This list may not describe all possible side effects. Call your doctor for medical advice about side effects. You may report side effects to FDA at 1-800-FDA-1088. Where should I keep my medication? This medication is given in a hospital or clinic. It will not be stored at home. NOTE: This sheet is a summary. It may not cover all possible information. If you have questions about this medicine, talk to your doctor, pharmacist, or health care provider.  2025 Elsevier/Gold Standard (2024-03-07 00:00:00)      To help prevent nausea and vomiting after your treatment, we encourage you to take your nausea medication as directed.  BELOW ARE SYMPTOMS THAT SHOULD BE REPORTED IMMEDIATELY: *FEVER GREATER THAN 100.4 F (  38 C) OR HIGHER *CHILLS OR SWEATING *NAUSEA AND VOMITING THAT IS NOT CONTROLLED WITH YOUR NAUSEA MEDICATION *UNUSUAL SHORTNESS OF BREATH *UNUSUAL BRUISING OR BLEEDING *URINARY PROBLEMS (pain or burning when urinating, or frequent urination) *BOWEL PROBLEMS (unusual diarrhea, constipation, pain near the anus) TENDERNESS IN MOUTH AND THROAT WITH OR WITHOUT PRESENCE OF ULCERS (sore throat, sores in mouth, or a toothache) UNUSUAL RASH, SWELLING OR PAIN  UNUSUAL VAGINAL DISCHARGE OR ITCHING   Items with * indicate a potential emergency and should be followed up as soon as possible or go to the  Emergency Department if any problems should occur.  Please show the CHEMOTHERAPY ALERT CARD or IMMUNOTHERAPY ALERT CARD at check-in to the Emergency Department and triage nurse.  Should you have questions after your visit or need to cancel or reschedule your appointment, please contact [facility] [place] - A DEPT OF [facility] [phone]  and follow the prompts.  Office hours are 8:00 a.m. to 4:30 p.m. Monday - Friday. Please note that voicemails left after 4:00 p.m. may not be returned until the following business day.  We are closed weekends and major holidays. You have access to a nurse at all times for urgent questions. Please call the main number to the clinic [phone] and follow the prompts.  For any non-urgent questions, you may also contact your provider using MyChart. We now offer e-Visits for anyone 15 and older to request care online for non-urgent symptoms. For details visit [email or web address].   Also download the MyChart app! Go to the app store, search MyChart, open the app, select [place], and log in with your MyChart username and password.

## 2024-08-10 NOTE — Progress Notes (Signed)
 Patient presents today for Vidaza  D3C3. Vital signs within parameters for treatment. Patient denies any side effects related to last treatment. Patient has complaints of mouth soreness and burning. Patient requesting something for pain. Patient instructed that we will send a script to her pharmacy for that.   Treatment given today per MD orders. Tolerated infusion without adverse affects. Vital signs stable. No complaints at this time. Discharged from clinic ambulatory in stable condition. Alert and oriented x 3. F/U with [facility] as scheduled.

## 2024-08-11 ENCOUNTER — Inpatient Hospital Stay

## 2024-08-11 VITALS — BP 116/47 | HR 77 | Temp 97.8°F | Resp 18

## 2024-08-11 DIAGNOSIS — C9310 Chronic myelomonocytic leukemia not having achieved remission: Secondary | ICD-10-CM

## 2024-08-11 MED ORDER — SODIUM CHLORIDE 0.9 % IV SOLN
75.0000 mg/m2 | Freq: Once | INTRAVENOUS | Status: AC
  Administered 2024-08-11: 116 mg via INTRAVENOUS
  Filled 2024-08-11: qty 11.6

## 2024-08-11 MED ORDER — SODIUM CHLORIDE 0.9 % IV SOLN: INTRAVENOUS | Status: DC

## 2024-08-11 MED ORDER — ONDANSETRON HCL 4 MG/2ML IJ SOLN
8.0000 mg | Freq: Once | INTRAMUSCULAR | Status: AC
  Administered 2024-08-11: 8 mg via INTRAVENOUS
  Filled 2024-08-11: qty 4

## 2024-08-11 NOTE — Patient Instructions (Signed)
 [facility] [place] [facility]  Discharge Instructions: Thank you for choosing [facility] to provide your oncology and hematology care.  If you have a lab appointment with the Cancer Center - please note that after April 8th, 2024, all labs will be drawn in the cancer center.  You do not have to check in or register with the main entrance as you have in the past but will complete your check-in in the cancer center.  Wear comfortable clothing and clothing appropriate for easy access to any Portacath or PICC line.   We strive to give you quality time with your provider. You may need to reschedule your appointment if you arrive late (15 or more minutes).  Arriving late affects you and other patients whose appointments are after yours.  Also, if you miss three or more appointments without notifying the office, you may be dismissed from the clinic at the provider's discretion.      For prescription refill requests, have your pharmacy contact our office and allow 72 hours for refills to be completed.    Today you received the following chemotherapy and/or immunotherapy agents Vidaza      To help prevent nausea and vomiting after your treatment, we encourage you to take your nausea medication as directed.  BELOW ARE SYMPTOMS THAT SHOULD BE REPORTED IMMEDIATELY: *FEVER GREATER THAN 100.4 F (38 C) OR HIGHER *CHILLS OR SWEATING *NAUSEA AND VOMITING THAT IS NOT CONTROLLED WITH YOUR NAUSEA MEDICATION *UNUSUAL SHORTNESS OF BREATH *UNUSUAL BRUISING OR BLEEDING *URINARY PROBLEMS (pain or burning when urinating, or frequent urination) *BOWEL PROBLEMS (unusual diarrhea, constipation, pain near the anus) TENDERNESS IN MOUTH AND THROAT WITH OR WITHOUT PRESENCE OF ULCERS (sore throat, sores in mouth, or a toothache) UNUSUAL RASH, SWELLING OR PAIN  UNUSUAL VAGINAL DISCHARGE OR ITCHING   Items with * indicate a potential emergency and should be followed up as  soon as possible or go to the Emergency Department if any problems should occur.  Please show the CHEMOTHERAPY ALERT CARD or IMMUNOTHERAPY ALERT CARD at check-in to the Emergency Department and triage nurse.  Should you have questions after your visit or need to cancel or reschedule your appointment, please contact [facility] CANCER CTR [place] - A DEPT OF Eligha Bridegroom [facility] [phone]  and follow the prompts.  Office hours are 8:00 a.m. to 4:30 p.m. Monday - Friday. Please note that voicemails left after 4:00 p.m. may not be returned until the following business day.  We are closed weekends and major holidays. You have access to a nurse at all times for urgent questions. Please call the main number to the clinic [phone] and follow the prompts.  For any non-urgent questions, you may also contact your provider using MyChart. We now offer e-Visits for anyone 72 and older to request care online for non-urgent symptoms. For details visit [email or web address].   Also download the MyChart app! Go to the app store, search "MyChart", open the app, select [place], and log in with your MyChart username and password.

## 2024-08-11 NOTE — Progress Notes (Signed)
 Patient presents today for Vidaza  infusion per providers order.  Patient has no new complaints at this time.    Treatment given today per MD orders.  Stable during infusion without adverse affects.  Vital signs stable.  No complaints at this time.  Discharge from clinic ambulatory in stable condition.  Alert and oriented X 3.  Follow up with [facility] as scheduled.

## 2024-08-14 ENCOUNTER — Inpatient Hospital Stay

## 2024-08-15 ENCOUNTER — Inpatient Hospital Stay

## 2024-08-16 ENCOUNTER — Inpatient Hospital Stay

## 2024-08-22 ENCOUNTER — Ambulatory Visit (HOSPITAL_COMMUNITY): Payer: Self-pay | Admitting: Psychiatry

## 2024-09-04 ENCOUNTER — Inpatient Hospital Stay

## 2024-09-05 ENCOUNTER — Inpatient Hospital Stay: Admitting: Oncology

## 2024-09-05 ENCOUNTER — Inpatient Hospital Stay

## 2024-09-06 ENCOUNTER — Inpatient Hospital Stay

## 2024-09-07 ENCOUNTER — Inpatient Hospital Stay

## 2024-09-08 ENCOUNTER — Inpatient Hospital Stay

## 2024-09-11 ENCOUNTER — Inpatient Hospital Stay

## 2024-09-12 ENCOUNTER — Inpatient Hospital Stay

## 2024-09-18 ENCOUNTER — Ambulatory Visit: Admitting: Internal Medicine

## 2024-10-18 ENCOUNTER — Ambulatory Visit: Admitting: Internal Medicine

## 2025-06-15 ENCOUNTER — Ambulatory Visit
